# Patient Record
Sex: Male | Born: 1965 | Hispanic: No | Marital: Married | Smoking: Former smoker
Health system: Southern US, Community
[De-identification: ages and names within clinical notes are randomized; demographics above are authoritative.]

## PROBLEM LIST (undated history)

## (undated) ENCOUNTER — Ambulatory Visit (HOSPITAL_COMMUNITY): Admission: EM | Payer: 59

## (undated) DIAGNOSIS — E119 Type 2 diabetes mellitus without complications: Secondary | ICD-10-CM

## (undated) DIAGNOSIS — S90852A Superficial foreign body, left foot, initial encounter: Secondary | ICD-10-CM

## (undated) DIAGNOSIS — S2249XA Multiple fractures of ribs, unspecified side, initial encounter for closed fracture: Secondary | ICD-10-CM

## (undated) DIAGNOSIS — T7840XA Allergy, unspecified, initial encounter: Secondary | ICD-10-CM

## (undated) DIAGNOSIS — E11621 Type 2 diabetes mellitus with foot ulcer: Secondary | ICD-10-CM

## (undated) DIAGNOSIS — N184 Chronic kidney disease, stage 4 (severe): Secondary | ICD-10-CM

## (undated) DIAGNOSIS — I10 Essential (primary) hypertension: Secondary | ICD-10-CM

## (undated) DIAGNOSIS — L97509 Non-pressure chronic ulcer of other part of unspecified foot with unspecified severity: Secondary | ICD-10-CM

## (undated) DIAGNOSIS — N186 End stage renal disease: Secondary | ICD-10-CM

## (undated) DIAGNOSIS — S83209A Unspecified tear of unspecified meniscus, current injury, unspecified knee, initial encounter: Secondary | ICD-10-CM

## (undated) DIAGNOSIS — Z8631 Personal history of diabetic foot ulcer: Secondary | ICD-10-CM

## (undated) HISTORY — DX: Personal history of diabetic foot ulcer: Z86.31

## (undated) HISTORY — DX: Type 2 diabetes mellitus with foot ulcer: E11.621

## (undated) HISTORY — DX: Multiple fractures of ribs, unspecified side, initial encounter for closed fracture: S22.49XA

## (undated) HISTORY — DX: Type 2 diabetes mellitus with foot ulcer: L97.509

## (undated) HISTORY — PX: EYE SURGERY: SHX253

## (undated) HISTORY — DX: Allergy, unspecified, initial encounter: T78.40XA

## (undated) HISTORY — DX: Chronic kidney disease, stage 4 (severe): N18.4

## (undated) MED ORDER — MORPHINE SULFATE 1 MG/ML IV SOLN SYRINGE
INTRAVENOUS | Status: AC
Start: 2014-10-28 — End: ?

## (undated) MED ORDER — OXYCODONE HCL 5 MG OR TABS
5.00 mg | ORAL_TABLET | ORAL | Status: AC | PRN
Start: 2014-10-30 — End: ?

## (undated) NOTE — *Deleted (*Deleted)
    SUBJECTIVE:   CHIEF COMPLAINT / HPI:   [name] is a 25 yr old male who presents today for hosp follow up   HTN Amlodipine 10mg  daily, hydralazine 25mg  . Tolerating well without side effects  DM, hypoglycemia   CKD, stage 4 Cr on discharge 7.37, GFR 8, BUN 81, phosp 8.4, K5 . Sevalemer held  2/2 diarrhea. D/c on kayexalate, pt has OP nephrology OP on 08/16/20.   PERTINENT  PMH / PSH: HTN, CKD, DM   OBJECTIVE:   There were no vitals taken for this visit.  General: Alert and cooperative and appears to be in no acute distress HEENT: Neck non-tender without lymphadenopathy, masses or thyromegaly Cardio: Normal S1 and S2, no S3 or S4. Rhythm is regular***. No murmurs or rubs.   Pulm: Clear to auscultation bilaterally, no crackles, wheezing, or diminished breath sounds. Normal respiratory effort Abdomen: Bowel sounds normal. Abdomen soft and non-tender.  Extremities: No peripheral edema. Warm/ well perfused.  Strong radial and pedal pulses***. Neuro: Cranial nerves grossly intact  ASSESSMENT/PLAN:   No problem-specific Assessment & Plan notes found for this encounter.     Lattie Haw, MD [place]

---

## 1999-12-01 ENCOUNTER — Emergency Department (HOSPITAL_COMMUNITY): Admission: EM | Admit: 1999-12-01 | Discharge: 1999-12-01 | Payer: Self-pay | Admitting: Emergency Medicine

## 1999-12-04 ENCOUNTER — Emergency Department (HOSPITAL_COMMUNITY): Admission: EM | Admit: 1999-12-04 | Discharge: 1999-12-04 | Payer: Self-pay | Admitting: Emergency Medicine

## 1999-12-04 ENCOUNTER — Encounter: Payer: Self-pay | Admitting: *Deleted

## 2000-02-07 ENCOUNTER — Emergency Department (HOSPITAL_COMMUNITY): Admission: EM | Admit: 2000-02-07 | Discharge: 2000-02-07 | Payer: Self-pay | Admitting: Emergency Medicine

## 2002-10-07 ENCOUNTER — Emergency Department (HOSPITAL_COMMUNITY): Admission: EM | Admit: 2002-10-07 | Discharge: 2002-10-07 | Payer: Self-pay | Admitting: Emergency Medicine

## 2010-03-18 ENCOUNTER — Emergency Department (HOSPITAL_COMMUNITY): Admission: EM | Admit: 2010-03-18 | Discharge: 2010-03-19 | Payer: Self-pay | Admitting: Emergency Medicine

## 2012-04-13 ENCOUNTER — Emergency Department (HOSPITAL_COMMUNITY): Payer: Self-pay

## 2012-04-13 ENCOUNTER — Inpatient Hospital Stay (HOSPITAL_COMMUNITY)
Admission: EM | Admit: 2012-04-13 | Discharge: 2012-04-15 | DRG: 603 | Disposition: A | Discharge status: Home / Self Care | Payer: Self-pay | Attending: Internal Medicine | Admitting: Internal Medicine

## 2012-04-13 ENCOUNTER — Encounter (HOSPITAL_COMMUNITY): Payer: Self-pay | Admitting: Emergency Medicine

## 2012-04-13 DIAGNOSIS — L02619 Cutaneous abscess of unspecified foot: Principal | ICD-10-CM | POA: Diagnosis present

## 2012-04-13 DIAGNOSIS — L03119 Cellulitis of unspecified part of limb: Principal | ICD-10-CM | POA: Diagnosis present

## 2012-04-13 DIAGNOSIS — Y92009 Unspecified place in unspecified non-institutional (private) residence as the place of occurrence of the external cause: Secondary | ICD-10-CM

## 2012-04-13 DIAGNOSIS — E1165 Type 2 diabetes mellitus with hyperglycemia: Secondary | ICD-10-CM | POA: Diagnosis present

## 2012-04-13 DIAGNOSIS — L03116 Cellulitis of left lower limb: Secondary | ICD-10-CM | POA: Diagnosis present

## 2012-04-13 DIAGNOSIS — L089 Local infection of the skin and subcutaneous tissue, unspecified: Secondary | ICD-10-CM | POA: Diagnosis present

## 2012-04-13 DIAGNOSIS — I10 Essential (primary) hypertension: Secondary | ICD-10-CM | POA: Diagnosis present

## 2012-04-13 DIAGNOSIS — Z720 Tobacco use: Secondary | ICD-10-CM | POA: Diagnosis present

## 2012-04-13 DIAGNOSIS — W269XXA Contact with unspecified sharp object(s), initial encounter: Secondary | ICD-10-CM | POA: Diagnosis present

## 2012-04-13 DIAGNOSIS — S90852A Superficial foreign body, left foot, initial encounter: Secondary | ICD-10-CM

## 2012-04-13 DIAGNOSIS — IMO0002 Reserved for concepts with insufficient information to code with codable children: Secondary | ICD-10-CM | POA: Diagnosis present

## 2012-04-13 DIAGNOSIS — Y998 Other external cause status: Secondary | ICD-10-CM

## 2012-04-13 DIAGNOSIS — Z8639 Personal history of other endocrine, nutritional and metabolic disease: Secondary | ICD-10-CM | POA: Diagnosis present

## 2012-04-13 DIAGNOSIS — F17200 Nicotine dependence, unspecified, uncomplicated: Secondary | ICD-10-CM | POA: Diagnosis present

## 2012-04-13 DIAGNOSIS — E119 Type 2 diabetes mellitus without complications: Secondary | ICD-10-CM | POA: Diagnosis present

## 2012-04-13 LAB — CBC WITH DIFFERENTIAL/PLATELET
Basophils Absolute: 0 10*3/uL (ref 0.0–0.1)
Basophils Relative: 0 % (ref 0–1)
Eosinophils Absolute: 0.6 10*3/uL (ref 0.0–0.7)
Eosinophils Relative: 5 % (ref 0–5)
HCT: 37.9 % — ABNORMAL LOW (ref 39.0–52.0)
Hemoglobin: 14 g/dL (ref 13.0–17.0)
Lymphocytes Relative: 9 % — ABNORMAL LOW (ref 12–46)
Lymphs Abs: 1.2 10*3/uL (ref 0.7–4.0)
MCH: 32.2 pg (ref 26.0–34.0)
MCHC: 36.9 g/dL — ABNORMAL HIGH (ref 30.0–36.0)
MCV: 87.1 fL (ref 78.0–100.0)
Monocytes Absolute: 1 10*3/uL (ref 0.1–1.0)
Monocytes Relative: 8 % (ref 3–12)
Neutro Abs: 9.6 10*3/uL — ABNORMAL HIGH (ref 1.7–7.7)
Neutrophils Relative %: 78 % — ABNORMAL HIGH (ref 43–77)
Platelets: 132 10*3/uL — ABNORMAL LOW (ref 150–400)
RBC: 4.35 MIL/uL (ref 4.22–5.81)
RDW: 12.3 % (ref 11.5–15.5)
WBC: 12.3 10*3/uL — ABNORMAL HIGH (ref 4.0–10.5)

## 2012-04-13 LAB — BASIC METABOLIC PANEL
BUN: 14 mg/dL (ref 6–23)
CO2: 26 mEq/L (ref 19–32)
Calcium: 8.7 mg/dL (ref 8.4–10.5)
Chloride: 100 mEq/L (ref 96–112)
Creatinine, Ser: 0.95 mg/dL (ref 0.50–1.35)
GFR calc Af Amer: >90 mL/min (ref >90)
GFR calc non Af Amer: >90 mL/min (ref >90)
Glucose, Bld: 311 mg/dL — ABNORMAL HIGH (ref 70–99)
Potassium: 3.8 mEq/L (ref 3.5–5.1)
Sodium: 136 mEq/L (ref 135–145)

## 2012-04-13 MED ORDER — PIPERACILLIN-TAZOBACTAM 3.375 G IVPB
3.3750 g | INTRAVENOUS | Status: AC
  Administered 2012-04-13: 3.375 g via INTRAVENOUS
  Filled 2012-04-13: qty 50

## 2012-04-13 MED ORDER — VANCOMYCIN HCL 10 G IV SOLR: 1000.0000 mg | Freq: Once | INTRAVENOUS | Status: DC

## 2012-04-13 MED ORDER — LIDOCAINE HCL (PF) 1 % IJ SOLN
5.0000 mL | Freq: Once | INTRAMUSCULAR | Status: AC
  Administered 2012-04-13: 5 mL
  Filled 2012-04-13: qty 5

## 2012-04-13 MED ORDER — MORPHINE SULFATE 4 MG/ML IJ SOLN
4.0000 mg | Freq: Once | INTRAMUSCULAR | Status: AC
  Administered 2012-04-13: 4 mg via INTRAVENOUS
  Filled 2012-04-13: qty 1

## 2012-04-13 MED ORDER — VANCOMYCIN HCL IN DEXTROSE 1-5 GM/200ML-% IV SOLN
1000.0000 mg | Freq: Once | INTRAVENOUS | Status: AC
  Administered 2012-04-14: 1000 mg via INTRAVENOUS
  Filled 2012-04-13: qty 200

## 2012-04-13 MED ORDER — TETANUS-DIPHTH-ACELL PERTUSSIS 5-2.5-18.5 LF-MCG/0.5 IM SUSP
0.5000 mL | Freq: Once | INTRAMUSCULAR | Status: AC
  Administered 2012-04-13: 0.5 mL via INTRAMUSCULAR
  Filled 2012-04-13: qty 0.5

## 2012-04-13 NOTE — H&P (Signed)
[name] is an 46 y.o. male.   PCP - None. Chief Complaint: Left foot pain and swelling. HPI: 46 year old male known history of diabetes mellitus type 2 and is not on any medications for last 8 months due to financial reasons had a injury to his left foot when he stood on a metallic piece 3 days ago. Since then his left foot started to swell up and pain. Since it was worsening he came to the ER. The left foot was swollen and erythematous. X-rays reveal a foreign body. At this time on-call orthopedic surgeon Dr.[name] was consulted by the ER physician. Patient has been admitted for IV antibiotics and an orthopedic surgeon will be seeing patient in consult. Patient otherwise denies any chest pain or shortness of breath or any loss of consciousness. Denies any pain or swelling right leg.  Past Medical History  Diagnosis Date  . Diabetes mellitus     History reviewed. No pertinent past surgical history.  Family History  Problem Relation Age of Onset  . Diabetes type II Mother    Social History:  reports that he has been smoking.  He does not have any smokeless tobacco history on file. He reports that he drinks alcohol. He reports that he does not use illicit drugs.  Allergies: No Known Allergies   (Not in a hospital admission)  Results for orders placed during the hospital encounter of 04/13/12 (from the past 48 hour(s))  CBC WITH DIFFERENTIAL     Status: Abnormal   Collection Time   04/13/12  5:57 PM      Component Value Range Comment   WBC 12.3 (*) 4.0 - 10.5 K/uL    RBC 4.35  4.22 - 5.81 MIL/uL    Hemoglobin 14.0  13.0 - 17.0 g/dL    HCT 37.9 (*) 39.0 - 52.0 %    MCV 87.1  78.0 - 100.0 fL    MCH 32.2  26.0 - 34.0 pg    MCHC 36.9 (*) 30.0 - 36.0 g/dL    RDW 12.3  11.5 - 15.5 %    Platelets 132 (*) 150 - 400 K/uL    Neutrophils Relative 78 (*) 43 - 77 %    Neutro Abs 9.6 (*) 1.7 - 7.7 K/uL    Lymphocytes Relative 9 (*) 12 - 46 %    Lymphs Abs 1.2  0.7 - 4.0 K/uL    Monocytes  Relative 8  3 - 12 %    Monocytes Absolute 1.0  0.1 - 1.0 K/uL    Eosinophils Relative 5  0 - 5 %    Eosinophils Absolute 0.6  0.0 - 0.7 K/uL    Basophils Relative 0  0 - 1 %    Basophils Absolute 0.0  0.0 - 0.1 K/uL   BASIC METABOLIC PANEL     Status: Abnormal   Collection Time   04/13/12  5:57 PM      Component Value Range Comment   Sodium 136  135 - 145 mEq/L    Potassium 3.8  3.5 - 5.1 mEq/L    Chloride 100  96 - 112 mEq/L    CO2 26  19 - 32 mEq/L    Glucose, Bld 311 (*) 70 - 99 mg/dL    BUN 14  6 - 23 mg/dL    Creatinine, Ser 0.95  0.50 - 1.35 mg/dL    Calcium 8.7  8.4 - 10.5 mg/dL    GFR calc non Af Amer >90  >90 mL/min  GFR calc Af Amer >90  >90 mL/min   GLUCOSE, CAPILLARY     Status: Abnormal   Collection Time   04/13/12  9:11 PM      Component Value Range Comment   Glucose-Capillary 180 (*) 70 - 99 mg/dL    Dg Foot Complete Left  04/13/2012  *RADIOLOGY REPORT*  Clinical Data: Remove foreign body several days ago now with pain and swelling in the proximal left great toe  LEFT FOOT - COMPLETE 3+ VIEW  Comparison: None.  Findings: No acute fracture is seen.  No bony demineralization or erosion is noted.  Best seen on the lateral view there is a small foreign body in the soft tissues of the plantar aspect of the ball of the foot at the level of the left first MTP joint.  IMPRESSION: Small foreign body in the soft tissues of the plantar aspect of the foot.  No bony abnormality is seen.  Original Report Authenticated By: Joretta Bachelor, M.D.    Review of Systems  Constitutional: Negative.   HENT: Negative.   Eyes: Negative.   Respiratory: Negative.   Cardiovascular: Negative.   Gastrointestinal: Negative.   Genitourinary: Negative.   Musculoskeletal:       Pain and swelling of the left foot.  Skin: Negative.   Neurological: Negative.   Endo/Heme/Allergies: Negative.   Psychiatric/Behavioral: Negative.     Blood pressure 172/83, pulse 84, temperature 97.7 F (36.5 C),  temperature source Oral, resp. rate 18, SpO2 100.00%. Physical Exam  Constitutional: He is oriented to person, place, and time. He appears well-developed and well-nourished. No distress.  HENT:  Head: Normocephalic and atraumatic.  Right Ear: External ear normal.  Left Ear: External ear normal.  Nose: Nose normal.  Mouth/Throat: Oropharynx is clear and moist. No oropharyngeal exudate.  Eyes: Conjunctivae are normal. Pupils are equal, round, and reactive to light. Right eye exhibits no discharge. Left eye exhibits no discharge. No scleral icterus.  Neck: Normal range of motion. Neck supple.  Cardiovascular: Normal rate and regular rhythm.   Respiratory: Effort normal and breath sounds normal.  GI: Soft. Bowel sounds are normal.  Musculoskeletal:       Swelling of left foot with erythema. Has good pulse.  Neurological: He is alert and oriented to person, place, and time.       Moves all extremities.  Skin: He is not diaphoretic. There is erythema (Left foot.).     Assessment/Plan #1. Cellulitis of the left foot with foreign body - patient has been started on vancomycin and Zosyn which we will continue. Patient has received tetanus booster. Orthopedic surgeon is to see the patient in consult. #2. Diabetes mellitus2 - patient is refusing to be on any type of insulin. At this time we will check his CBGs a.c. and at bedtime. If it is significantly high we have to ask him again if it is okay to start insulin sliding coverage. If patient has not been to go for any procedures or not receive any IV contrast then may consider starting metformin later. #3. Tobacco abuse - have advised patient to quit smoking.  CODE STATUS  - full code.    [name]. 04/13/2012, 11:51 PM

## 2012-04-13 NOTE — ED Notes (Signed)
Pt c/o left foot pain after having piece of metal stuck in foot; foot now red, swollen and painful; pt with hx of DM

## 2012-04-13 NOTE — ED Notes (Signed)
Pt states he has been off his medication for diabetes for 1 year.

## 2012-04-13 NOTE — ED Provider Notes (Signed)
History     CSN: [id number]  Arrival date & time 04/13/12  1744   First MD Initiated Contact with Patient 04/13/12 1957      Chief Complaint  Patient presents with  . Foot Pain    (Consider location/radiation/quality/duration/timing/severity/associated sxs/prior treatment) Patient is a 46 y.o. male presenting with lower extremity pain. The history is provided by the patient.  Foot Pain  He is diabetic but lost his insurance is been off of his medications for the last 8 months. During that time, he has not checked his blood sugars. He was doing some work with siding 3 days ago. 2 days ago, he woke up and he thought there may been piece of metal in his foot he pulled a piece of metal out. Today, his foot is swollen and red and painful. Pain is moderately severe and is rated at 8/10. It is worse with palpation or with standing. He denies fever, chills, sweats. He is worried there might be a piece of metal left in his foot. The metal pierced the sole of his tennis shoes.  Past Medical History  Diagnosis Date  . Diabetes mellitus     History reviewed. No pertinent past surgical history.  History reviewed. No pertinent family history.  History  Substance Use Topics  . Smoking status: Current Some Day Smoker  . Smokeless tobacco: Not on file  . Alcohol Use: Yes      Review of Systems  All other systems reviewed and are negative.    Allergies  Review of patient's allergies indicates no known allergies.  Home Medications  No current outpatient prescriptions on file.  BP 162/82  Pulse 89  Temp 98.4 F (36.9 C) (Oral)  Resp 19  SpO2 98%  Physical Exam  Nursing note and vitals reviewed.  46 year old male who is resting comfortably and in no acute distress. Vital signs are significant for hypertension with blood pressure 161/87. Oxygen saturation is 97% which is normal. Is normocephalic and atraumatic. PERRLA, EOMI. Neck is nontender and supple. Back is nontender. Lungs  are clear without rales, wheezes, or rhonchi. Heart has regular rate and rhythm without murmur. Abdomen is soft, flat, nontender without masses or hepatosplenomegaly and peristalsis is normal active. Extremities: There is a puncture wound in the plantar surface of the left foot over the region of the first MTP joint. The distal foot is erythematous, warm, and swollen. No lymphangitic streaks are seen. Skin is warm and dry without other rash. Neurologic: Mental status is normal, cranial nerves are intact, there are no motor or sensory deficits.  ED Course  FOREIGN BODY REMOVAL Date/Time: 04/13/2012 9:30 PM Performed by: Delora Fuel Authorized by: Roxanne Mins, Eveline Sauve Consent: Verbal consent obtained. Written consent not obtained. Risks and benefits: risks, benefits and alternatives were discussed Consent given by: patient Patient understanding: patient states understanding of the procedure being performed Patient consent: the patient's understanding of the procedure matches consent given Procedure consent: procedure consent matches procedure scheduled Relevant documents: relevant documents present and verified Test results: test results available and properly labeled Site marked: the operative site was marked Imaging studies: imaging studies available Required items: required blood products, implants, devices, and special equipment available Patient identity confirmed: verbally with patient and arm band Time out: Immediately prior to procedure a "time out" was called to verify the correct patient, procedure, equipment, support staff and site/side marked as required. Intake: left foot  Anesthesia: local infiltration Local anesthetic: lidocaine 1% without epinephrine Anesthetic total: 5 ml Patient sedated:  no Patient restrained: no Patient cooperative: yes Complexity: simple 0 objects recovered. Post-procedure assessment: foreign body not removed Patient tolerance: Patient tolerated the procedure  well with no immediate complications. Comments: Unable to locate foreign body - will need to be done under fluoroscopy.   (including critical care time)  Results for orders placed during the hospital encounter of 04/13/12  CBC WITH DIFFERENTIAL      Component Value Range   WBC 12.3 (*) 4.0 - 10.5 K/uL   RBC 4.35  4.22 - 5.81 MIL/uL   Hemoglobin 14.0  13.0 - 17.0 g/dL   HCT 37.9 (*) 39.0 - 52.0 %   MCV 87.1  78.0 - 100.0 fL   MCH 32.2  26.0 - 34.0 pg   MCHC 36.9 (*) 30.0 - 36.0 g/dL   RDW 12.3  11.5 - 15.5 %   Platelets 132 (*) 150 - 400 K/uL   Neutrophils Relative 78 (*) 43 - 77 %   Neutro Abs 9.6 (*) 1.7 - 7.7 K/uL   Lymphocytes Relative 9 (*) 12 - 46 %   Lymphs Abs 1.2  0.7 - 4.0 K/uL   Monocytes Relative 8  3 - 12 %   Monocytes Absolute 1.0  0.1 - 1.0 K/uL   Eosinophils Relative 5  0 - 5 %   Eosinophils Absolute 0.6  0.0 - 0.7 K/uL   Basophils Relative 0  0 - 1 %   Basophils Absolute 0.0  0.0 - 0.1 K/uL  BASIC METABOLIC PANEL      Component Value Range   Sodium 136  135 - 145 mEq/L   Potassium 3.8  3.5 - 5.1 mEq/L   Chloride 100  96 - 112 mEq/L   CO2 26  19 - 32 mEq/L   Glucose, Bld 311 (*) 70 - 99 mg/dL   BUN 14  6 - 23 mg/dL   Creatinine, Ser 0.95  0.50 - 1.35 mg/dL   Calcium 8.7  8.4 - 10.5 mg/dL   GFR calc non Af Amer >90  >90 mL/min   GFR calc Af Amer >90  >90 mL/min  GLUCOSE, CAPILLARY      Component Value Range   Glucose-Capillary 180 (*) 70 - 99 mg/dL   Dg Foot Complete Left  04/13/2012  *RADIOLOGY REPORT*  Clinical Data: Remove foreign body several days ago now with pain and swelling in the proximal left great toe  LEFT FOOT - COMPLETE 3+ VIEW  Comparison: None.  Findings: No acute fracture is seen.  No bony demineralization or erosion is noted.  Best seen on the lateral view there is a small foreign body in the soft tissues of the plantar aspect of the ball of the foot at the level of the left first MTP joint.  IMPRESSION: Small foreign body in the soft tissues  of the plantar aspect of the foot.  No bony abnormality is seen.  Original Report Authenticated By: Joretta Bachelor, M.D.      1. Cellulitis of left foot   2. Foreign body in left foot   3. Diabetes mellitus       MDM  Cellulitis with retained foreign body. Foreign body we'll need to be removed. Because cellulitis is from something which punctured through the sole of his sneaker, he will need coverage for her Pseudomonas. He is started on vancomycin and Zosyn.  Attempts to remove foreign body were unsuccessful. Display to be done under fluoroscopy. I have contacted Dr. Sharol Given of orthopedic surgery who will see  the patient in the hospital. He will be admitted for IV antibiotics and orthopedic consultation. Case is discussed with Dr. Hal Hope of [facility] who agrees to admit the patient.        Delora Fuel, MD [id number]

## 2012-04-14 ENCOUNTER — Encounter (HOSPITAL_COMMUNITY): Payer: Self-pay | Admitting: Orthopedic Surgery

## 2012-04-14 DIAGNOSIS — I10 Essential (primary) hypertension: Secondary | ICD-10-CM | POA: Diagnosis present

## 2012-04-14 LAB — GLUCOSE, CAPILLARY: Glucose-Capillary: 194 mg/dL — ABNORMAL HIGH (ref 70–99)

## 2012-04-14 MED ORDER — MUPIROCIN CALCIUM 2 % EX CREA
TOPICAL_CREAM | Freq: Two times a day (BID) | CUTANEOUS | Status: DC
  Administered 2012-04-14 (×2): via TOPICAL
  Filled 2012-04-14: qty 15

## 2012-04-14 MED ORDER — ACETAMINOPHEN 325 MG PO TABS
650.0000 mg | ORAL_TABLET | Freq: Four times a day (QID) | ORAL | Status: DC | PRN
  Administered 2012-04-14: 650 mg via ORAL
  Filled 2012-04-14: qty 2

## 2012-04-14 MED ORDER — ONDANSETRON HCL 4 MG/2ML IJ SOLN: 4.0000 mg | Freq: Three times a day (TID) | INTRAMUSCULAR | Status: DC | PRN

## 2012-04-14 MED ORDER — INSULIN ASPART 100 UNIT/ML ~~LOC~~ SOLN
0.0000 [IU] | Freq: Three times a day (TID) | SUBCUTANEOUS | Status: DC
  Administered 2012-04-14 (×2): 2 [IU] via SUBCUTANEOUS
  Administered 2012-04-15 (×3): 3 [IU] via SUBCUTANEOUS

## 2012-04-14 MED ORDER — ONDANSETRON HCL 4 MG/2ML IJ SOLN: 4.0000 mg | Freq: Four times a day (QID) | INTRAMUSCULAR | Status: DC | PRN

## 2012-04-14 MED ORDER — AMLODIPINE BESYLATE 5 MG PO TABS
5.0000 mg | ORAL_TABLET | Freq: Every day | ORAL | Status: DC
  Administered 2012-04-15: 5 mg via ORAL
  Filled 2012-04-14: qty 1

## 2012-04-14 MED ORDER — VANCOMYCIN HCL IN DEXTROSE 1-5 GM/200ML-% IV SOLN
1000.0000 mg | Freq: Three times a day (TID) | INTRAVENOUS | Status: DC
  Administered 2012-04-14 – 2012-04-15 (×4): 1000 mg via INTRAVENOUS
  Filled 2012-04-14 (×6): qty 200

## 2012-04-14 MED ORDER — HYDROMORPHONE HCL PF 1 MG/ML IJ SOLN
0.5000 mg | INTRAMUSCULAR | Status: DC | PRN
  Administered 2012-04-14 (×2): 0.5 mg via INTRAVENOUS
  Filled 2012-04-14: qty 1

## 2012-04-14 MED ORDER — INSULIN GLARGINE 100 UNIT/ML ~~LOC~~ SOLN
6.0000 [IU] | Freq: Every day | SUBCUTANEOUS | Status: DC
  Administered 2012-04-15: 6 [IU] via SUBCUTANEOUS

## 2012-04-14 MED ORDER — PIPERACILLIN-TAZOBACTAM 3.375 G IVPB
3.3750 g | Freq: Three times a day (TID) | INTRAVENOUS | Status: DC
Start: 2012-04-14 — End: 2012-04-15
  Administered 2012-04-14 – 2012-04-15 (×5): 3.375 g via INTRAVENOUS
  Filled 2012-04-14 (×6): qty 50

## 2012-04-14 MED ORDER — SODIUM CHLORIDE 0.9 % IV SOLN
INTRAVENOUS | Status: DC
  Administered 2012-04-14: 01:00:00 via INTRAVENOUS

## 2012-04-14 MED ORDER — HYDROMORPHONE HCL PF 1 MG/ML IJ SOLN: 1.0000 mg | INTRAMUSCULAR | Status: DC | PRN

## 2012-04-14 MED ORDER — ACETAMINOPHEN 650 MG RE SUPP: 650.0000 mg | Freq: Four times a day (QID) | RECTAL | Status: DC | PRN

## 2012-04-14 MED ORDER — HYDROMORPHONE HCL PF 1 MG/ML IJ SOLN
INTRAMUSCULAR | Status: AC
  Administered 2012-04-14: 0.5 mg
  Filled 2012-04-14: qty 1

## 2012-04-14 MED ORDER — ONDANSETRON HCL 4 MG PO TABS: 4.0000 mg | ORAL_TABLET | Freq: Four times a day (QID) | ORAL | Status: DC | PRN

## 2012-04-14 NOTE — Progress Notes (Signed)
Nurse had conversation with pt & spouse re; DM and seriousness of being non-compliant with insulin.  Pt stated he would reconsider insulin or other medications to treat his DM.  Nursing to continue to monitor for status changes.

## 2012-04-14 NOTE — Progress Notes (Signed)
[name] is a 46 y.o. male admitted with cellulitis resulting from a pernetrating metalic object in the left foot. [name] says he has good feeling in both his feet(diabetic off meds for 9 months now), but says he did not experience any pain when he stepped on the metal. Apparently, he figured it out later on. He is not too enthused about taking meds, especially insulin, which he has now agreed to take. He is worried about the cost of meds, but also seems to not trust the benefit of the medications. Apprecaite orthopedics assistance.  SUBJECTIVE Feels better since admission.   1. Cellulitis of left foot   2. Foreign body in left foot   3. Diabetes mellitus   4. Tobacco abuse     Past Medical History  Diagnosis Date  . Diabetes mellitus    Current Facility-Administered Medications  Medication Dose Route Frequency Provider Last Rate Last Dose  . 0.9 %  sodium chloride infusion   Intravenous Continuous Rise Patience, MD 75 mL/hr at 04/14/12 1757    . acetaminophen (TYLENOL) tablet 650 mg  650 mg Oral Q6H PRN Rise Patience, MD   650 mg at 04/14/12 0841   Or  . acetaminophen (TYLENOL) suppository 650 mg  650 mg Rectal Q6H PRN Rise Patience, MD      . amLODipine (NORVASC) tablet 5 mg  5 mg Oral Daily Alie Moudy, MD      . HYDROmorphone (DILAUDID) 1 MG/ML injection        0.5 mg at 04/14/12 0100  . HYDROmorphone (DILAUDID) injection 0.5 mg  0.5 mg Intravenous Q4H PRN Rise Patience, MD   0.5 mg at 04/14/12 1835  . insulin aspart (novoLOG) injection 0-9 Units  0-9 Units Subcutaneous TID WC Nat Math, MD   2 Units at 04/14/12 1632  . insulin glargine (LANTUS) injection 6 Units  6 Units Subcutaneous Daily Khi Mcmillen, MD      . mupirocin cream (BACTROBAN) 2 %   Topical BID Newt Minion, MD      . ondansetron [facility]) tablet 4 mg  4 mg Oral Q6H PRN Rise Patience, MD       Or  . ondansetron [facility]) injection 4 mg  4 mg Intravenous Q6H PRN Rise Patience, MD      . piperacillin-tazobactam (ZOSYN) IVPB 3.375 g  3.375 g Intravenous STAT Delora Fuel, MD   [id number] g at 04/13/12 2037  . piperacillin-tazobactam (ZOSYN) IVPB 3.375 g  3.375 g Intravenous Q8H Rogue Bussing, PHARMD   3.375 g at 04/14/12 2118  . vancomycin (VANCOCIN) IVPB 1000 mg/200 mL premix  1,000 mg Intravenous Once Delora Fuel, MD   [id number] mg at 04/14/12 0227  . vancomycin (VANCOCIN) IVPB 1000 mg/200 mL premix  1,000 mg Intravenous Q8H Rogue Bussing, PHARMD   1,000 mg at 04/14/12 1757  . DISCONTD: HYDROmorphone (DILAUDID) injection 1 mg  1 mg Intravenous [id number] PRN Delora Fuel, MD      . DISCONTD: ondansetron [facility]) injection 4 mg  4 mg Intravenous [id number] PRN Delora Fuel, MD      . DISCONTD: vancomycin Zollie Pee) injection 1,000 mg  1,000 mg Intravenous Once Delora Fuel, MD       No Known Allergies Principal Problem:  *Cellulitis of foot, left Active Problems:  DM2 (diabetes mellitus, type 2)  Tobacco abuse   Vital signs in last 24 hours: Temp:  [97.5 F (36.4 C)-98.8 F (37.1 C)] 97.5 F (36.4 C) (  07/09 2154) Pulse Rate:  [70-87] 87  (07/09 2154) Resp:  [18-20] 18  (07/09 2154) BP: (144-172)/(77-94) 166/94 mmHg (07/09 2154) SpO2:  [99 %-100 %] 100 % (07/09 2154) Weight:  [95.165 kg (209 lb 12.8 oz)] 95.165 kg (209 lb 12.8 oz) (07/09 0007) Weight change:  Last BM Date: 04/13/12  Intake/Output from previous day:   Intake/Output this shift:    Lab Results:  Basename 04/13/12 1757  WBC 12.3*  HGB 14.0  HCT 37.9*  PLT 132*   BMET  Basename 04/13/12 1757  NA 136  K 3.8  CL 100  CO2 26  GLUCOSE 311*  BUN 14  CREATININE 0.95  CALCIUM 8.7    Studies/Results: Dg Foot Complete Left  04/13/2012  *RADIOLOGY REPORT*  Clinical Data: Remove foreign body several days ago now with pain and swelling in the proximal left great toe  LEFT FOOT - COMPLETE 3+ VIEW  Comparison: None.  Findings: No acute fracture is seen.  No bony demineralization or  erosion is noted.  Best seen on the lateral view there is a small foreign body in the soft tissues of the plantar aspect of the ball of the foot at the level of the left first MTP joint.  IMPRESSION: Small foreign body in the soft tissues of the plantar aspect of the foot.  No bony abnormality is seen.  Original Report Authenticated By: Joretta Bachelor, M.D.    Medications: I have reviewed the patient's current medications.   Physical exam GENERAL- alert HEAD- normal atraumatic, no neck masses, normal thyroid, no jvd RESPIRATORY- appears well, vitals normal, no respiratory distress, acyanotic, normal RR, ear and throat exam is normal, neck free of mass or lymphadenopathy, chest clear, no wheezing, crepitations, rhonchi, normal symmetric air entry CVS- regular rate and rhythm, S1, S2 normal, no murmur, click, rub or gallop ABDOMEN- abdomen is soft without significant tenderness, masses, organomegaly or guarding NEURO- Grossly normal EXTREMITIES- swollen, warm, erythematous left foot.  Plan  * Cellulitis of foot, left/lodged foreign body- appreciate ortho. Continue current abx. Wonder if he has peripheral neuropathy. * DM2 (diabetes mellitus, type 2)- lantus/aspart, maybe d/c on metformin or another oral. Check hba1c. * Htn- start norvasc. * Tobacco abuse    [name] 04/14/2012 10:34 PM Pager: [phone].

## 2012-04-14 NOTE — Progress Notes (Signed)
UR COMPLETED  

## 2012-04-14 NOTE — Care Management Note (Signed)
    Page 1 of 1   04/14/2012     3:51:47 PM   CARE MANAGEMENT NOTE 04/14/2012  Patient:  [name], [name]   Account Number:  [id number]  Date Initiated:  04/14/2012  Documentation initiated by:  Rozanna Boer  Subjective/Objective Assessment:   foreign body in foot  h/o DM, Glu 311 on admission, not taking diab. med due to financial .     Action/Plan:   Medication assistance  Medical f/u   Anticipated DC Date:  04/17/2012   Anticipated DC Plan:  [place] / [facility] (established/new)      Choice offered to / List presented to:             Status of service:  Completed, signed off Medicare Important Message given?  NO (If response is "NO", the following Medicare IM given date fields will be blank) Date Medicare IM given:   Date Additional Medicare IM given:    Discharge Disposition:    Per UR Regulation:  Reviewed for med. necessity/level of care/duration of stay  If discussed at [place] of Stay Meetings, dates discussed:    Comments:  7/913  15:15 Rozanna Boer RN/CM No PCP, self pay provided patient with [place] card free medication for DM provided with [facility] information and received permission to fax referral to [facility] faxed referral to [phone]

## 2012-04-14 NOTE — Progress Notes (Signed)
ANTIBIOTIC CONSULT NOTE - INITIAL  Pharmacy Consult for Vancocin/Zosyn Indication: cellulitis  No Known Allergies  Patient Measurements: Height: 5\' 11"  (180.3 cm) Weight: 209 lb 12.8 oz (95.165 kg) IBW/kg (Calculated) : 75.3   Vital Signs: Temp: 98.2 F (36.8 C) (07/09 0007) Temp src: Oral (07/08 2328) BP: 159/79 mmHg (07/09 0007) Pulse Rate: 78  (07/09 0007)  Labs:  Basename 04/13/12 1757  WBC 12.3*  HGB 14.0  PLT 132*  LABCREA --  CREATININE 0.95   Estimated Creatinine Clearance: 118.1 ml/min (by C-G formula based on Cr of 0.95).  Microbiology: No results found for this or any previous visit (from the past 720 hour(s)).  Medical History: Past Medical History  Diagnosis Date  . Diabetes mellitus     Medications:  No prescriptions prior to admission   Scheduled:    . HYDROmorphone      . lidocaine  5 mL Infiltration Once  .  morphine injection  4 mg Intravenous Once  . piperacillin-tazobactam (ZOSYN)  IV  3.375 g Intravenous STAT  . piperacillin-tazobactam (ZOSYN)  IV  3.375 g Intravenous Q8H  . TDaP  0.5 mL Intramuscular Once  . vancomycin  1,000 mg Intravenous Once  . vancomycin  1,000 mg Intravenous Q8H  . DISCONTD: vancomycin  1,000 mg Intravenous Once    Assessment: 46yo male sustained injury to left foot 3d ago, worsening erythema and pain, Xray shows foreign body, to begin IV ABX for cellulitis, awaiting surgeon consult.  Goal of Therapy:  Vancomycin trough level 10-15 mcg/ml  Plan:  Will start vancomycin 1000mg  IV Q8H and Zosyn 3.375g IV Q8H and monitor CBC, Cx, levels prn.  Rogue Bussing PharmD BCPS 04/14/2012,12:43 AM

## 2012-04-14 NOTE — Consult Note (Signed)
Reason for Consult: Cellulitis left foot with foreign body Referring Physician: [name] is an 46 y.o. male.  HPI: Patient is a 46 year old gentleman with uncontrolled diabetes who has a history of foreign body beneath the great toe metatarsal head. He has developed cellulitis and is admitted this time for treatment of cellulitis and evaluation for possible removal of foreign body.  Past Medical History  Diagnosis Date  . Diabetes mellitus     History reviewed. No pertinent past surgical history.  Family History  Problem Relation Age of Onset  . Diabetes type II Mother     Social History:  reports that he has been smoking.  He does not have any smokeless tobacco history on file. He reports that he drinks alcohol. He reports that he does not use illicit drugs.  Allergies: No Known Allergies  Medications: I have reviewed the patient's current medications.  Results for orders placed during the hospital encounter of 04/13/12 (from the past 48 hour(s))  CBC WITH DIFFERENTIAL     Status: Abnormal   Collection Time   04/13/12  5:57 PM      Component Value Range Comment   WBC 12.3 (*) 4.0 - 10.5 K/uL    RBC 4.35  4.22 - 5.81 MIL/uL    Hemoglobin 14.0  13.0 - 17.0 g/dL    HCT 37.9 (*) 39.0 - 52.0 %    MCV 87.1  78.0 - 100.0 fL    MCH 32.2  26.0 - 34.0 pg    MCHC 36.9 (*) 30.0 - 36.0 g/dL    RDW 12.3  11.5 - 15.5 %    Platelets 132 (*) 150 - 400 K/uL    Neutrophils Relative 78 (*) 43 - 77 %    Neutro Abs 9.6 (*) 1.7 - 7.7 K/uL    Lymphocytes Relative 9 (*) 12 - 46 %    Lymphs Abs 1.2  0.7 - 4.0 K/uL    Monocytes Relative 8  3 - 12 %    Monocytes Absolute 1.0  0.1 - 1.0 K/uL    Eosinophils Relative 5  0 - 5 %    Eosinophils Absolute 0.6  0.0 - 0.7 K/uL    Basophils Relative 0  0 - 1 %    Basophils Absolute 0.0  0.0 - 0.1 K/uL   BASIC METABOLIC PANEL     Status: Abnormal   Collection Time   04/13/12  5:57 PM      Component Value Range Comment   Sodium 136  135 - 145  mEq/L    Potassium 3.8  3.5 - 5.1 mEq/L    Chloride 100  96 - 112 mEq/L    CO2 26  19 - 32 mEq/L    Glucose, Bld 311 (*) 70 - 99 mg/dL    BUN 14  6 - 23 mg/dL    Creatinine, Ser 0.95  0.50 - 1.35 mg/dL    Calcium 8.7  8.4 - 10.5 mg/dL    GFR calc non Af Amer >90  >90 mL/min    GFR calc Af Amer >90  >90 mL/min   GLUCOSE, CAPILLARY     Status: Abnormal   Collection Time   04/13/12  9:11 PM      Component Value Range Comment   Glucose-Capillary 180 (*) 70 - 99 mg/dL     Dg Foot Complete Left  04/13/2012  *RADIOLOGY REPORT*  Clinical Data: Remove foreign body several days ago now with pain and swelling in the  proximal left great toe  LEFT FOOT - COMPLETE 3+ VIEW  Comparison: None.  Findings: No acute fracture is seen.  No bony demineralization or erosion is noted.  Best seen on the lateral view there is a small foreign body in the soft tissues of the plantar aspect of the ball of the foot at the level of the left first MTP joint.  IMPRESSION: Small foreign body in the soft tissues of the plantar aspect of the foot.  No bony abnormality is seen.  Original Report Authenticated By: Joretta Bachelor, M.D.    Review of Systems  All other systems reviewed and are negative.   Blood pressure 160/81, pulse 70, temperature 98 F (36.7 C), temperature source Oral, resp. rate 19, height 5\' 11"  (1.803 m), weight 95.165 kg (209 lb 12.8 oz), SpO2 100.00%. Physical Exam On examination patient has a good dorsalis pedis pulse he does not have venous stasis swelling he has a very small puncture wound on the plantar aspect of the left foot great toe metatarsal head. He does have some petechial cellulitis around the great toe MTP joint. Review of the radiographs shows a very small metallic foreign body deep in the soft tissue. Assessment/Plan: Assessment: Cellulitis left foot great toe MTP joint with very small retained metallic foreign body deep in the soft tissue.  Plan: I will follow patient expectantly while on  his antibiotics. If all the cellulitis resolves then would hold on removal of deep retained hardware if patient has persistent cellulitis would consider surgical intervention later this week for removal of deep retained foreign body.  [name],[name] 04/14/2012, 6:25 AM

## 2012-04-14 NOTE — Progress Notes (Signed)
Inpatient Diabetes Program Recommendations  AACE/ADA: New Consensus Statement on Inpatient Glycemic Control (2009)  Target Ranges:  Prepandial:   less than 140 mg/dL      Peak postprandial:   less than 180 mg/dL (1-2 hours)      Critically ill patients:  140 - 180 mg/dL    Inpatient Diabetes Program Recommendations HgbA1C: to assess prehospital glucose control  Will follow during this admission.  Thank you  Raoul Pitch [facility] Inpatient Diabetes Coordinator [phone]

## 2012-04-15 ENCOUNTER — Encounter (HOSPITAL_COMMUNITY): Payer: Self-pay | Admitting: General Practice

## 2012-04-15 DIAGNOSIS — I10 Essential (primary) hypertension: Secondary | ICD-10-CM

## 2012-04-15 DIAGNOSIS — IMO0002 Reserved for concepts with insufficient information to code with codable children: Secondary | ICD-10-CM

## 2012-04-15 LAB — HEMOGLOBIN A1C: Hgb A1c MFr Bld: 10 % — ABNORMAL HIGH (ref <5.7)

## 2012-04-15 LAB — CBC
HCT: 39 % (ref 39.0–52.0)
Hemoglobin: 14.7 g/dL (ref 13.0–17.0)
RBC: 4.52 MIL/uL (ref 4.22–5.81)
RDW: 12.2 % (ref 11.5–15.5)
WBC: 10.1 10*3/uL (ref 4.0–10.5)

## 2012-04-15 LAB — GLUCOSE, CAPILLARY
Glucose-Capillary: 207 mg/dL — ABNORMAL HIGH (ref 70–99)
Glucose-Capillary: 243 mg/dL — ABNORMAL HIGH (ref 70–99)

## 2012-04-15 LAB — COMPREHENSIVE METABOLIC PANEL
AST: 24 U/L (ref 0–37)
Albumin: 3 g/dL — ABNORMAL LOW (ref 3.5–5.2)
Alkaline Phosphatase: 108 U/L (ref 39–117)
BUN: 11 mg/dL (ref 6–23)
Chloride: 99 mEq/L (ref 96–112)
Potassium: 3.4 mEq/L — ABNORMAL LOW (ref 3.5–5.1)
Total Bilirubin: 1.1 mg/dL (ref 0.3–1.2)

## 2012-04-15 MED ORDER — METFORMIN HCL 500 MG PO TABS
500.0000 mg | ORAL_TABLET | Freq: Two times a day (BID) | ORAL | Status: DC
  Administered 2012-04-15: 500 mg via ORAL
  Filled 2012-04-15 (×2): qty 1

## 2012-04-15 MED ORDER — INSULIN GLARGINE 100 UNIT/ML ~~LOC~~ SOLN: 10.0000 [IU] | Freq: Every day | SUBCUTANEOUS | Status: DC

## 2012-04-15 MED ORDER — CLINDAMYCIN HCL 300 MG PO CAPS: 300.0000 mg | ORAL_CAPSULE | Freq: Three times a day (TID) | ORAL | Status: DC

## 2012-04-15 MED ORDER — LISINOPRIL 5 MG PO TABS
5.0000 mg | ORAL_TABLET | Freq: Every day | ORAL | Status: DC
  Administered 2012-04-15: 5 mg via ORAL
  Filled 2012-04-15: qty 1

## 2012-04-15 MED ORDER — LISINOPRIL 5 MG PO TABS: 5.0000 mg | ORAL_TABLET | Freq: Every day | ORAL | Status: DC

## 2012-04-15 MED ORDER — INSULIN GLARGINE 100 UNIT/ML ~~LOC~~ SOLN: 6.0000 [IU] | Freq: Every day | SUBCUTANEOUS | Status: DC

## 2012-04-15 MED ORDER — METFORMIN HCL 500 MG PO TABS: 500.0000 mg | ORAL_TABLET | Freq: Two times a day (BID) | ORAL | Status: DC

## 2012-04-15 MED ORDER — POTASSIUM CHLORIDE CRYS ER 20 MEQ PO TBCR
40.0000 meq | EXTENDED_RELEASE_TABLET | Freq: Two times a day (BID) | ORAL | Status: DC
  Administered 2012-04-15: 40 meq via ORAL
  Filled 2012-04-15: qty 2

## 2012-04-15 MED ORDER — CLINDAMYCIN HCL 300 MG PO CAPS
300.0000 mg | ORAL_CAPSULE | Freq: Three times a day (TID) | ORAL | Status: DC
  Administered 2012-04-15: 300 mg via ORAL
  Filled 2012-04-15 (×3): qty 1

## 2012-04-15 MED ORDER — AMLODIPINE BESYLATE 5 MG PO TABS: 5.0000 mg | ORAL_TABLET | Freq: Every day | ORAL | Status: DC

## 2012-04-15 MED ORDER — LEVOFLOXACIN 500 MG PO TABS
500.0000 mg | ORAL_TABLET | Freq: Every day | ORAL | Status: DC
  Administered 2012-04-15: 500 mg via ORAL
  Filled 2012-04-15 (×2): qty 1

## 2012-04-15 MED ORDER — LEVOFLOXACIN 500 MG PO TABS: 500.0000 mg | ORAL_TABLET | Freq: Every day | ORAL | Status: DC

## 2012-04-15 NOTE — Discharge Summary (Signed)
Physician Discharge Summary  [name] [id number] DOB: 1966-03-21 DOA: 04/13/2012  PCP: William Hamburger, MD  Admit date: 04/13/2012 Discharge date: 04/15/2012  Recommendations for Outpatient Follow-up:  1. See Dr Sharol Given in 2 weeks if the cellulitis doesn't improve. 2. Follow up PCP ( call and make appt) in 2 to 4 weeks and follow up with blood work.  3. Check cbg's atleast once a day and be compliant to medications.  4. Carb modified diet.   Discharge Diagnoses:  Principal Problem:  *Cellulitis of foot, left Active Problems:  DM2 (diabetes mellitus, type 2)  Tobacco abuse  HTN (hypertension), benign   Discharge Condition: stable  Diet recommendation: carb modified diet.  History of present illness:  [name] is a 46 y.o. male admitted with cellulitis resulting from a pernetrating metalic object in the left foot. [name] says he has good feeling in both his feet(diabetic off meds for 9 months now), but says he did not experience any pain when he stepped on the metal. Apparently, he figured it out later on. He is not too enthused about taking meds, especially insulin, which he has now agreed to take. He is worried about the cost of meds, but also seems to not trust the benefit of the medications.  Hospital Course:  1. Cellulitis of the foot with x ray of the left foot showing a soft tissue foreign body. He was started on iv vancomycin and IV zosyn on admission, his symptoms improved. Orthopedic consult was obtained to see if he need surgical intervention to remove the foreign body. As per Dr Sharol Given, the cellulitis can resolve without surgery,  And that he is good to be discharged home with oral antibiotics. Patient wants to be discharged today, I have discussed with him that some of the blood tests are still pending and his BP remains high and that we have started new medications, to control BP and BLOOD SUGARS.  He wanted to be discharged today and promised he would follow up with Dr  [name] and PCP in 4 weeks. He is being discharged on clindamycin and Levaquin and Case management is assisting him to get the medications through the pharmacy, as he does not have any insurance.   2. Diabetes mellitus: CBG (last 3)   Basename 04/15/12 1034 04/15/12 0645 04/14/12 2131  GLUCAP 207* 225* 268*    On lantus 6 units. hgba1c is still pending Started the patient on metformin .   Hypertension; not well controlled, has been off medications for one year Started the patient on amlodipine and lisinopril.   Tobacco abuse: education and counseling.   Recommendations are to see Dr [name] in 2 weeks if the cellulitis doesn't improve.   Procedures:  X ray of the left foot.   Consultations:  Orthopedic consult  Discharge Exam: Filed Vitals:   04/15/12 1100  BP: 172/86  Pulse: 86  Temp:   Resp:    Filed Vitals:   04/14/12 1300 04/14/12 2154 04/15/12 0616 04/15/12 1100  BP: 144/77 166/94 192/90 172/86  Pulse: 79 87 89 86  Temp: 98.8 F (37.1 C) 97.5 F (36.4 C) 98.4 F (36.9 C)   TempSrc:      Resp: 20 18 18    Height:      Weight:      SpO2: 99% 100% 99%    General: alert afebrile, comfortable. Cardiovascular: s1s2  heard Respiratory: CTAB Abdomen soft NT ND BS+ Extremities; foot tenderness and swelling improved. Pt is able to bear weight on the  foot  Discharge Instructions  Discharge Orders    Future Orders Please Complete By Expires   Diet - low sodium heart healthy      Discharge instructions      Comments:   Follow up with Dr Sharol Given in 2 weeks and PCP In 2 to 4 weeks.   Activity as tolerated - No restrictions        Medication List  As of 04/15/2012 12:19 PM   TAKE these medications         amLODipine 5 MG tablet   Commonly known as: NORVASC   Take 1 tablet (5 mg total) by mouth daily.      clindamycin 300 MG capsule   Commonly known as: CLEOCIN   Take 1 capsule (300 mg total) by mouth every 8 (eight) hours.      insulin glargine 100 UNIT/ML  injection   Commonly known as: LANTUS   Inject 6 Units into the skin daily.      levofloxacin 500 MG tablet   Commonly known as: LEVAQUIN   Take 1 tablet (500 mg total) by mouth daily.      lisinopril 5 MG tablet   Commonly known as: PRINIVIL,ZESTRIL   Take 1 tablet (5 mg total) by mouth daily.      metFORMIN 500 MG tablet   Commonly known as: GLUCOPHAGE   Take 1 tablet (500 mg total) by mouth 2 (two) times daily with a meal.           Follow-up Information    Follow up with [name],MARCUS V, MD in 2 weeks.   Contact information:   [place] [phone]       Follow up with William Hamburger, MD today.   Contact information:   [place] [phone]           The results of significant diagnostics from this hospitalization (including imaging, microbiology, ancillary and laboratory) are listed below for reference.    Significant Diagnostic Studies: Dg Foot Complete Left  04/13/2012  *RADIOLOGY REPORT*  Clinical Data: Remove foreign body several days ago now with pain and swelling in the proximal left great toe  LEFT FOOT - COMPLETE 3+ VIEW  Comparison: None.  Findings: No acute fracture is seen.  No bony demineralization or erosion is noted.  Best seen on the lateral view there is a small foreign body in the soft tissues of the plantar aspect of the ball of the foot at the level of the left first MTP joint.  IMPRESSION: Small foreign body in the soft tissues of the plantar aspect of the foot.  No bony abnormality is seen.  Original Report Authenticated By: Joretta Bachelor, M.D.    Microbiology: Recent Results (from the past 240 hour(s))  MRSA PCR SCREENING     Status: Normal   Collection Time   04/14/12  8:28 AM      Component Value Range Status Comment   MRSA by PCR NEGATIVE  NEGATIVE Final      Labs: Basic Metabolic Panel:  Lab [id number] 0624 04/13/12 1757  NA 136 136  K 3.4* 3.8    CL 99 100  CO2 27 26  GLUCOSE 203* 311*  BUN 11 14  CREATININE 0.67 0.95  CALCIUM 8.9 8.7  MG -- --  PHOS -- --   Liver Function Tests:  Lab 04/15/12 0624  AST 24  ALT 41  ALKPHOS 108  BILITOT 1.1  PROT 6.7  ALBUMIN 3.0*   No results found for this basename: LIPASE:5,AMYLASE:5 in the last 168 hours No results found for this basename: AMMONIA:5 in the last 168 hours CBC:  Lab 04/15/12 0624 04/13/12 1757  WBC 10.1 12.3*  NEUTROABS -- 9.6*  HGB 14.7 14.0  HCT 39.0 37.9*  MCV 86.3 87.1  PLT 140* 132*   Cardiac Enzymes: No results found for this basename: CKTOTAL:5,CKMB:5,CKMBINDEX:5,TROPONINI:5 in the last 168 hours BNP: BNP (last 3 results) No results found for this basename: PROBNP:3 in the last 8760 hours CBG:  Lab 04/15/12 1034 04/15/12 0645 04/14/12 2131 04/14/12 1621 04/14/12 1108  GLUCAP 207* 225* 268* 199* 194*    Time coordinating discharge: 60 minutes   Signed:  Chizaram Latino  [facility] 04/15/2012, 12:19 PM

## 2012-04-15 NOTE — Progress Notes (Addendum)
Pt was able to return demonstrate proper administration and is able to properly draw up insulin. Pt given handouts regarding adverse effects of diabetes, Type II and Type II diabetes information sheets, diabetic foot care, diabetic diet, s/sx hypo and hyperglycemia and emergency measures to take, etc. Pt states, "I will read them".  All questions answered.

## 2012-04-15 NOTE — Progress Notes (Signed)
Patient ID: [name], male   DOB: May 17, 1966, 46 y.o.   MRN: [id number] Cellulitis improving left foot patient is asymptomatic. Okay to discharge patient to home at this time on by mouth antibiotics. I will followup in the office in 2 weeks. Discussed with the foot gets worse he will call and I will followup immediately. Anticipate this can resolve without surgery. However if the cellulitis worsens then we can proceed with outpatient surgery for removal of the foreign body if necessary.

## 2012-04-15 NOTE — Progress Notes (Addendum)
Pt admitted for cellulitis of L big toe and L lateral foot adjacent L big toe. Pt noted to have a black dot underneath foot below L big toe site that pt states, "that is where I got metal in my foot and I tried to get it out." Pt repts decreased reddness, edema and pain of L foot. Pt foot remains swollen with 1+ slightly pitting edema and reddness and warm to touch. Pt wound areas are open to air and free of drainage. Pt rates his pain 5-6 but pt states, "That is okay for me. I don't want anything for pain".  Pt repts that his pain is most noted when he holds his foot downward. Pt is keeping LLE elevated at all times to prevent/minimize pain and swelling. Pt is up in room without difficulty. Pt has + pedal pulses bilaterally. Pt is alert and oriented x 3. Pt lungs CTA and heart rate regular rate and rhythm. No s/sx cardiac or resp distress and no c/o such. BP has been elevated. Pt is on medications for such. Pt repts BM this AM. Abdomen is soft flat nontender and nondistended. BS +x4. Pt denies nausea or vomiting. Pt repts passing gas and tolerates diet fair to good. Pt denies problems with urination. Pt instructed how to administer insulin and how to draw up insulin. Pt verb understanding and agrees to comply. Pt is able to describe s/sx of elevated and low blood sugars and emergency measures to take. Pt instructed adverse effects of uncontrolled diabetes including heart disease, blindness, peripheral vascular disease that can result in impaired healing that can result in amputations, impotence and other possible adverse effects. Pt verb understanding and agrees to comply.

## 2012-04-21 ENCOUNTER — Other Ambulatory Visit (HOSPITAL_COMMUNITY): Payer: Self-pay | Admitting: Orthopedic Surgery

## 2012-04-21 ENCOUNTER — Encounter (HOSPITAL_COMMUNITY): Payer: Self-pay | Admitting: Pharmacy Technician

## 2012-04-22 ENCOUNTER — Encounter (HOSPITAL_COMMUNITY)
Admission: RE | Admit: 2012-04-22 | Discharge: 2012-04-22 | Disposition: A | Discharge status: Home / Self Care | Payer: Medicaid Other | Source: Ambulatory Visit | Attending: Orthopedic Surgery | Admitting: Orthopedic Surgery

## 2012-04-22 ENCOUNTER — Encounter (HOSPITAL_COMMUNITY): Payer: Self-pay

## 2012-04-22 HISTORY — DX: Superficial foreign body, left foot, initial encounter: S90.852A

## 2012-04-22 HISTORY — DX: Essential (primary) hypertension: I10

## 2012-04-22 LAB — PROTIME-INR: INR: 1.06 (ref 0.00–1.49)

## 2012-04-22 MED ORDER — DEXTROSE 5 % IV SOLN
2.0000 g | INTRAVENOUS | Status: AC
  Administered 2012-04-23: 2 g via INTRAVENOUS
  Filled 2012-04-22: qty 20

## 2012-04-22 MED ORDER — DEXTROSE 5 % IV SOLN: 2.0000 g | INTRAVENOUS | Status: DC

## 2012-04-22 NOTE — Pre-Procedure Instructions (Signed)
[place]  04/22/2012   Your procedure is scheduled on:  Thursday July 18  Report to [facility] at [place].  Call this number if you have problems the morning of surgery: [phone]   Remember:   Do not eat food:After Midnight.  May have clear liquids:until Midnight .   [name]  Take these medicines the morning of surgery with A SIP OF WATER: *Clindamycin**   Do not wear jewelry, make-up or nail polish.  Do not wear lotions, powders, or perfumes. You may wear deodorant.  Do not shave 48 hours prior to surgery. Men may shave face and neck.  Do not bring valuables to the hospital.  Contacts, dentures or bridgework may not be worn into surgery.  Leave suitcase in the car. After surgery it may be brought to your room.  For patients admitted to the hospital, checkout time is 11:00 AM the day of discharge.   Patients discharged the day of surgery will not be allowed to drive home.  Name and phone number of your driver: n/a  Special Instructions: Incentive Spirometry - Practice and bring it with you on the day of surgery. and CHG Shower Use Special Wash: 1/2 bottle night before surgery and 1/2 bottle morning of surgery.   Please read over the following fact sheets that you were given: Pain Booklet, Coughing and Deep Breathing, MRSA Information and Surgical Site Infection Prevention

## 2012-04-23 ENCOUNTER — Encounter (HOSPITAL_COMMUNITY): Payer: Self-pay | Admitting: Certified Registered"

## 2012-04-23 ENCOUNTER — Encounter (HOSPITAL_COMMUNITY)
Admission: RE | Disposition: A | Discharge status: Home / Self Care | Payer: Self-pay | Source: Ambulatory Visit | Attending: Orthopedic Surgery

## 2012-04-23 ENCOUNTER — Ambulatory Visit (HOSPITAL_COMMUNITY)
Admission: RE | Admit: 2012-04-23 | Discharge: 2012-04-23 | Disposition: A | Discharge status: Home / Self Care | Payer: Medicaid Other | Source: Ambulatory Visit | Attending: Orthopedic Surgery | Admitting: Orthopedic Surgery

## 2012-04-23 ENCOUNTER — Inpatient Hospital Stay (HOSPITAL_COMMUNITY): Payer: Medicaid Other | Admitting: Certified Registered"

## 2012-04-23 DIAGNOSIS — Z01818 Encounter for other preprocedural examination: Secondary | ICD-10-CM | POA: Insufficient documentation

## 2012-04-23 DIAGNOSIS — L03116 Cellulitis of left lower limb: Secondary | ICD-10-CM

## 2012-04-23 DIAGNOSIS — F17200 Nicotine dependence, unspecified, uncomplicated: Secondary | ICD-10-CM | POA: Insufficient documentation

## 2012-04-23 DIAGNOSIS — I10 Essential (primary) hypertension: Secondary | ICD-10-CM | POA: Insufficient documentation

## 2012-04-23 DIAGNOSIS — Z01812 Encounter for preprocedural laboratory examination: Secondary | ICD-10-CM | POA: Insufficient documentation

## 2012-04-23 DIAGNOSIS — L02619 Cutaneous abscess of unspecified foot: Secondary | ICD-10-CM | POA: Insufficient documentation

## 2012-04-23 DIAGNOSIS — E119 Type 2 diabetes mellitus without complications: Secondary | ICD-10-CM | POA: Insufficient documentation

## 2012-04-23 DIAGNOSIS — Z01810 Encounter for preprocedural cardiovascular examination: Secondary | ICD-10-CM | POA: Insufficient documentation

## 2012-04-23 DIAGNOSIS — Z01811 Encounter for preprocedural respiratory examination: Secondary | ICD-10-CM | POA: Insufficient documentation

## 2012-04-23 HISTORY — PX: I & D EXTREMITY: SHX5045

## 2012-04-23 SURGERY — IRRIGATION AND DEBRIDEMENT EXTREMITY
Anesthesia: General | Site: Foot | Laterality: Left | Wound class: Dirty or Infected

## 2012-04-23 MED ORDER — FENTANYL CITRATE 0.05 MG/ML IJ SOLN
INTRAMUSCULAR | Status: DC | PRN
  Administered 2012-04-23: 50 ug via INTRAVENOUS

## 2012-04-23 MED ORDER — SODIUM CHLORIDE 0.9 % IR SOLN
Status: DC | PRN
  Administered 2012-04-23: 1000 mL

## 2012-04-23 MED ORDER — LACTATED RINGERS IV SOLN
INTRAVENOUS | Status: DC | PRN
  Administered 2012-04-23: 07:00:00 via INTRAVENOUS

## 2012-04-23 MED ORDER — PROMETHAZINE HCL 25 MG/ML IJ SOLN: 6.2500 mg | INTRAMUSCULAR | Status: DC | PRN

## 2012-04-23 MED ORDER — MEPERIDINE HCL 25 MG/ML IJ SOLN: 6.2500 mg | INTRAMUSCULAR | Status: DC | PRN

## 2012-04-23 MED ORDER — DOXYCYCLINE HYCLATE 100 MG PO TABS: 100.0000 mg | ORAL_TABLET | Freq: Two times a day (BID) | ORAL | Status: AC

## 2012-04-23 MED ORDER — HYDROMORPHONE HCL PF 1 MG/ML IJ SOLN
0.2500 mg | INTRAMUSCULAR | Status: DC | PRN
  Administered 2012-04-23 (×3): 0.5 mg via INTRAVENOUS

## 2012-04-23 MED ORDER — MIDAZOLAM HCL 5 MG/5ML IJ SOLN
INTRAMUSCULAR | Status: DC | PRN
  Administered 2012-04-23: 2 mg via INTRAVENOUS

## 2012-04-23 MED ORDER — CEFAZOLIN SODIUM-DEXTROSE 2-3 GM-% IV SOLR
INTRAVENOUS | Status: AC
  Filled 2012-04-23: qty 50

## 2012-04-23 MED ORDER — HYDROMORPHONE HCL PF 1 MG/ML IJ SOLN
INTRAMUSCULAR | Status: AC
  Filled 2012-04-23: qty 1

## 2012-04-23 MED ORDER — LIDOCAINE HCL (CARDIAC) 20 MG/ML IV SOLN
INTRAVENOUS | Status: DC | PRN
  Administered 2012-04-23: 50 mg via INTRAVENOUS

## 2012-04-23 MED ORDER — ONDANSETRON HCL 4 MG/2ML IJ SOLN
INTRAMUSCULAR | Status: DC | PRN
  Administered 2012-04-23: 4 mg via INTRAVENOUS

## 2012-04-23 MED ORDER — PROPOFOL 10 MG/ML IV EMUL
INTRAVENOUS | Status: DC | PRN
  Administered 2012-04-23: 200 mg via INTRAVENOUS

## 2012-04-23 SURGICAL SUPPLY — 49 items
BANDAGE GAUZE ELAST BULKY 4 IN (GAUZE/BANDAGES/DRESSINGS) ×2 IMPLANT
BLADE SURG 10 STRL SS (BLADE) ×2 IMPLANT
BNDG COHESIVE 4X5 TAN STRL (GAUZE/BANDAGES/DRESSINGS) IMPLANT
BNDG COHESIVE 6X5 TAN STRL LF (GAUZE/BANDAGES/DRESSINGS) ×2 IMPLANT
BNDG ESMARK 4X9 LF (GAUZE/BANDAGES/DRESSINGS) ×2 IMPLANT
BNDG GAUZE STRTCH 6 (GAUZE/BANDAGES/DRESSINGS) IMPLANT
CANISTER SUCTION 2500CC (MISCELLANEOUS) ×2 IMPLANT
CLOTH BEACON ORANGE TIMEOUT ST (SAFETY) ×2 IMPLANT
COTTON STERILE ROLL (GAUZE/BANDAGES/DRESSINGS) IMPLANT
COVER SURGICAL LIGHT HANDLE (MISCELLANEOUS) ×2 IMPLANT
CUFF TOURNIQUET SINGLE 18IN (TOURNIQUET CUFF) IMPLANT
CUFF TOURNIQUET SINGLE 24IN (TOURNIQUET CUFF) IMPLANT
CUFF TOURNIQUET SINGLE 34IN LL (TOURNIQUET CUFF) IMPLANT
CUFF TOURNIQUET SINGLE 44IN (TOURNIQUET CUFF) IMPLANT
DRAIN PENROSE 1/4X12 LTX STRL (WOUND CARE) ×2 IMPLANT
DRAPE U-SHAPE 47X51 STRL (DRAPES) ×2 IMPLANT
DRSG ADAPTIC 3X8 NADH LF (GAUZE/BANDAGES/DRESSINGS) ×2 IMPLANT
DRSG PAD ABDOMINAL 8X10 ST (GAUZE/BANDAGES/DRESSINGS) ×2 IMPLANT
DURAPREP 26ML APPLICATOR (WOUND CARE) ×2 IMPLANT
ELECT CAUTERY BLADE 6.4 (BLADE) ×2 IMPLANT
ELECT REM PT RETURN 9FT ADLT (ELECTROSURGICAL) ×2
ELECTRODE REM PT RTRN 9FT ADLT (ELECTROSURGICAL) ×1 IMPLANT
GLOVE BIOGEL PI IND STRL 9 (GLOVE) ×1 IMPLANT
GLOVE BIOGEL PI INDICATOR 9 (GLOVE) ×1
GLOVE SURG ORTHO 9.0 STRL STRW (GLOVE) IMPLANT
GOWN PREVENTION PLUS XLARGE (GOWN DISPOSABLE) ×2 IMPLANT
GOWN SRG XL XLNG 56XLVL 4 (GOWN DISPOSABLE) ×1 IMPLANT
GOWN STRL NON-REIN XL XLG LVL4 (GOWN DISPOSABLE) ×1
HANDPIECE INTERPULSE COAX TIP (DISPOSABLE)
KIT BASIN OR (CUSTOM PROCEDURE TRAY) ×2 IMPLANT
KIT ROOM TURNOVER OR (KITS) ×2 IMPLANT
MANIFOLD NEPTUNE II (INSTRUMENTS) IMPLANT
NS IRRIG 1000ML POUR BTL (IV SOLUTION) ×2 IMPLANT
PACK ORTHO EXTREMITY (CUSTOM PROCEDURE TRAY) ×2 IMPLANT
PAD ARMBOARD 7.5X6 YLW CONV (MISCELLANEOUS) ×2 IMPLANT
PADDING CAST COTTON 6X4 STRL (CAST SUPPLIES) IMPLANT
SET HNDPC FAN SPRY TIP SCT (DISPOSABLE) IMPLANT
SPONGE GAUZE 4X4 12PLY (GAUZE/BANDAGES/DRESSINGS) ×2 IMPLANT
SPONGE LAP 18X18 X RAY DECT (DISPOSABLE) ×2 IMPLANT
STOCKINETTE IMPERVIOUS 9X36 MD (GAUZE/BANDAGES/DRESSINGS) ×2 IMPLANT
SUT ETHILON 2 0 PSLX (SUTURE) ×2 IMPLANT
SWAB COLLECTION DEVICE MRSA (MISCELLANEOUS) ×2 IMPLANT
TOWEL OR 17X24 6PK STRL BLUE (TOWEL DISPOSABLE) ×2 IMPLANT
TOWEL OR 17X26 10 PK STRL BLUE (TOWEL DISPOSABLE) ×2 IMPLANT
TUBE ANAEROBIC SPECIMEN COL (MISCELLANEOUS) ×2 IMPLANT
TUBE CONNECTING 12X1/4 (SUCTIONS) ×2 IMPLANT
UNDERPAD 30X30 INCONTINENT (UNDERPADS AND DIAPERS) ×2 IMPLANT
WATER STERILE IRR 1000ML POUR (IV SOLUTION) IMPLANT
YANKAUER SUCT BULB TIP NO VENT (SUCTIONS) ×2 IMPLANT

## 2012-04-23 NOTE — Anesthesia Postprocedure Evaluation (Signed)
  Anesthesia Post-op Note  Patient: [name]  Procedure(s) Performed: Procedure(s) (LRB): IRRIGATION AND DEBRIDEMENT EXTREMITY (Left)  Patient Location: PACU  Anesthesia Type: General  Level of Consciousness: awake  Airway and Oxygen Therapy: Patient Spontanous Breathing  Post-op Pain: none  Post-op Assessment: Post-op Vital signs reviewed  Post-op Vital Signs: stable  Complications: No apparent anesthesia complications

## 2012-04-23 NOTE — H&P (Signed)
[name] is an 46 y.o. male.   Chief Complaint: Persistent abscess infection left foot secondary to foreign body. HPI: Patient is a 46 year old gentleman who stepped on an object and had a foreign body penetrated the plantar aspect of the left foot. Patient was initially admitted placed on IV antibiotics and has had recurrent infection secondary to retained foreign body. Patient presents at this time for removal of deep retained foreign body.  Past Medical History  Diagnosis Date  . Hypertension   . Diabetes mellitus     Type 2 DM  x 24 years  . Foreign body in left foot     with  infection    Past Surgical History  Procedure Date  . No past surgeries     Family History  Problem Relation Age of Onset  . Diabetes type II Mother   . Diabetes type II Sister    Social History:  reports that he has been smoking Cigarettes.  He has never used smokeless tobacco. He reports that he drinks about 4.8 ounces of alcohol per week. He reports that he does not use illicit drugs.  Allergies:  Allergies  Allergen Reactions  . Latex Rash    Pt states he he allergic to latex condoms    Medications Prior to Admission  Medication Sig Dispense Refill  . clindamycin (CLEOCIN) 300 MG capsule Take 300 mg by mouth every 8 (eight) hours.      . insulin glargine (LANTUS) 100 UNIT/ML injection Inject 6 Units into the skin daily.      [name] levofloxacin (LEVAQUIN) 500 MG tablet Take 500 mg by mouth daily.      [name] lisinopril (PRINIVIL,ZESTRIL) 5 MG tablet Take 5 mg by mouth daily.      . metFORMIN (GLUCOPHAGE) 500 MG tablet Take 500 mg by mouth 2 (two) times daily with a meal.      . mupirocin cream (BACTROBAN) 2 % Apply 1 application topically 2 (two) times daily.        Results for orders placed during the hospital encounter of 04/22/12 (from the past 48 hour(s))  APTT     Status: Normal   Collection Time   04/22/12  9:11 AM      Component Value Range Comment   aPTT 28  24 - 37 seconds   PROTIME-INR      Status: Normal   Collection Time   04/22/12  9:11 AM      Component Value Range Comment   Prothrombin Time 14.0  11.6 - 15.2 seconds    INR 1.06  0.00 - 1.49    Dg Chest 2 View  04/22/2012  *RADIOLOGY REPORT*  Clinical Data: Preoperative assessment for irrigation and debridement of left foot and removal of foreign body, history hypertension, diabetes, smoking  CHEST - 2 VIEW  Comparison: 03/18/2010  Findings: Normal heart size, mediastinal contours, and pulmonary vascularity. Lungs clear. No pleural effusion or pneumothorax. No acute osseous findings.  IMPRESSION: No acute abnormalities.  Original Report Authenticated By: Burnetta Sabin, M.D.    Review of Systems  All other systems reviewed and are negative.    There were no vitals taken for this visit. Physical Exam  On examination patient has redness cellulitis of the left forefoot. He does have palpable pulses there is no purulent drainage there is a puncture wound from the foreign body. Assessment/Plan Assessment: Cellulitis and infection left forefoot secondary to foreign body puncture wound.  Plan: We will plan for open irrigation and  debridement of the left foot. Risks and benefits of surgery were discussed including persistent infection. Patient states he understands was pursued this time.  Malavika Lira V 04/23/2012, 6:16 AM

## 2012-04-23 NOTE — Anesthesia Procedure Notes (Signed)
Procedure Name: LMA Insertion Date/Time: 04/23/2012 7:55 AM Performed by: Manuela Schwartz B Pre-anesthesia Checklist: Patient identified, Emergency Drugs available, Suction available, Patient being monitored and Timeout performed Patient Re-evaluated:Patient Re-evaluated prior to inductionOxygen Delivery Method: Circle system utilized Preoxygenation: Pre-oxygenation with 100% oxygen Intubation Type: IV induction LMA: LMA inserted LMA Size: 4.0 Number of attempts: 1 Placement Confirmation: positive ETCO2 and breath sounds checked- equal and bilateral Tube secured with: Tape Dental Injury: Teeth and Oropharynx as per pre-operative assessment

## 2012-04-23 NOTE — Op Note (Signed)
OPERATIVE REPORT  DATE OF SURGERY: 04/23/2012  PATIENT:  [name],  46 y.o. male  PRE-OPERATIVE DIAGNOSIS:  Abscess Left foot  POST-OPERATIVE DIAGNOSIS:  Abscess left foot  PROCEDURE:  Procedure(s): IRRIGATION AND DEBRIDEMENT EXTREMITY  SURGEON:  Surgeon(s): Newt Minion, MD  ANESTHESIA:   general  EBL:  Minimal ML  SPECIMEN:  No Specimen  TOURNIQUET:  * No tourniquets in log *  PROCEDURE DETAILS: Patient is a 46 year old gentleman stepped on a foreign body beneath the left great toe MTP joint. He developed cellulitis he was admitted hospitalized placed on IV antibiotics and discharged on by mouth antibiotics. Patient has had persistent cellulitis drainage and presents at this time for open irrigation and debridement. Risks and benefits were discussed including infection neurovascular injury nonhealing of the wound need for additional surgery. Patient states he understands and wishes to proceed at this time. Description of procedure patient was brought to the OR and underwent a general anesthetic. After adequate levels of anesthesia were obtained patient's left lower extremity was prepped using DuraPrep and draped into a sterile field. A midline incision was made on the plantar aspect of the MTP joint directly in line with his traumatic wound. There was a purulent abscess deep within the soft tissue. Cultures were obtained. The necrotic tissue was removed. The wound is irrigated with normal saline. Esmarch was released after proximally 6 minutes hemostasis was obtained. A Penrose drain was placed to with the wound open. The incision was closed using 2-0 nylon. The wound is covered with Adaptic orthopedic sponges AB dressing Kerlix and Coban. Patient was extubated taken to the PACU in stable condition plan for discharge to home prescription for doxycycline followup in one week.  PLAN OF CARE: Discharge to home after PACU  PATIENT DISPOSITION:  PACU - hemodynamically stable.     Newt Minion, MD 04/23/2012 8:21 AM

## 2012-04-23 NOTE — Transfer of Care (Signed)
Immediate Anesthesia Transfer of Care Note  Patient: [name]  Procedure(s) Performed: Procedure(s) (LRB): IRRIGATION AND DEBRIDEMENT EXTREMITY (Left)  Patient Location: PACU  Anesthesia Type: General  Level of Consciousness: awake, alert , oriented and patient cooperative  Airway & Oxygen Therapy: Patient Spontanous Breathing and Patient connected to nasal cannula oxygen  Post-op Assessment: Report given to PACU RN, Post -op Vital signs reviewed and stable and Patient moving all extremities  Post vital signs: Reviewed and stable  Complications: No apparent anesthesia complications

## 2012-04-23 NOTE — Progress Notes (Signed)
Orthopedic Tech Progress Note Patient Details:  [name] 23-Mar-1966 [id number]  Ortho Devices Type of Ortho Device: Postop boot Ortho Device/Splint Interventions: Application   [name] 04/23/2012, 9:44 AM

## 2012-04-23 NOTE — Anesthesia Preprocedure Evaluation (Addendum)
Anesthesia Evaluation  Patient identified by MRN, date of birth, ID band  Reviewed: H&P , NPO status , Patient's Chart, lab work & pertinent test results  History of Anesthesia Complications Negative for: history of anesthetic complications  Airway Mallampati: II  Neck ROM: Full    Dental  (+) Poor Dentition   Pulmonary  breath sounds clear to auscultation        Cardiovascular hypertension, Rhythm:Regular Rate:Normal     Neuro/Psych    GI/Hepatic   Endo/Other    Renal/GU      Musculoskeletal   Abdominal (+) + obese,   Peds  Hematology   Anesthesia Other Findings   Reproductive/Obstetrics                          Anesthesia Physical Anesthesia Plan  ASA: II  Anesthesia Plan: General   Post-op Pain Management:    Induction: Intravenous  Airway Management Planned: LMA  Additional Equipment:   Intra-op Plan:   Post-operative Plan: Extubation in OR  Informed Consent:   Dental advisory given  Plan Discussed with: CRNA and Surgeon  Anesthesia Plan Comments:         Anesthesia Quick Evaluation

## 2012-04-24 ENCOUNTER — Encounter (HOSPITAL_COMMUNITY): Payer: Self-pay | Admitting: Orthopedic Surgery

## 2012-04-27 LAB — CULTURE, ROUTINE-ABSCESS: Gram Stain: NONE SEEN

## 2012-04-28 LAB — ANAEROBIC CULTURE

## 2012-05-20 ENCOUNTER — Ambulatory Visit: Payer: Self-pay | Admitting: Family Medicine

## 2012-05-22 ENCOUNTER — Encounter: Payer: Self-pay | Admitting: Family Medicine

## 2012-05-22 ENCOUNTER — Ambulatory Visit (INDEPENDENT_AMBULATORY_CARE_PROVIDER_SITE_OTHER): Payer: Medicaid Other | Admitting: Family Medicine

## 2012-05-22 VITALS — BP 158/95 | HR 87 | Temp 98.7°F | Ht 71.0 in | Wt 204.5 lb

## 2012-05-22 DIAGNOSIS — E119 Type 2 diabetes mellitus without complications: Secondary | ICD-10-CM

## 2012-05-22 DIAGNOSIS — I10 Essential (primary) hypertension: Secondary | ICD-10-CM

## 2012-05-22 DIAGNOSIS — L02619 Cutaneous abscess of unspecified foot: Secondary | ICD-10-CM

## 2012-05-22 DIAGNOSIS — L03116 Cellulitis of left lower limb: Secondary | ICD-10-CM

## 2012-05-22 DIAGNOSIS — L03119 Cellulitis of unspecified part of limb: Secondary | ICD-10-CM

## 2012-05-22 MED ORDER — INSULIN GLARGINE 100 UNIT/ML ~~LOC~~ SOLN: 6.0000 [IU] | Freq: Every day | SUBCUTANEOUS | Status: DC

## 2012-05-22 MED ORDER — METFORMIN HCL 500 MG PO TABS: 500.0000 mg | ORAL_TABLET | Freq: Two times a day (BID) | ORAL | Status: DC

## 2012-05-22 MED ORDER — "INSULIN SYRINGE/NEEDLE 28G X 1/2"" 1 ML MISC": Status: DC

## 2012-05-22 MED ORDER — LISINOPRIL 10 MG PO TABS: 10.0000 mg | ORAL_TABLET | Freq: Every day | ORAL | Status: DC

## 2012-05-22 NOTE — Patient Instructions (Signed)
It was great to meet you today! We will arrange for you to see our diabetes educator.  Expect to get a phone call some time in the next several business days. Make certain to give [place] a call if you notice your foot starting to get red or painful again. Come back to see me in 3-4 weeks so we can check your blood pressure.

## 2012-06-01 ENCOUNTER — Encounter: Payer: Self-pay | Admitting: Family Medicine

## 2012-06-01 ENCOUNTER — Ambulatory Visit (INDEPENDENT_AMBULATORY_CARE_PROVIDER_SITE_OTHER): Payer: Medicaid Other | Admitting: Family Medicine

## 2012-06-01 VITALS — BP 126/85 | HR 82 | Temp 97.9°F | Ht 71.0 in

## 2012-06-01 DIAGNOSIS — E1169 Type 2 diabetes mellitus with other specified complication: Secondary | ICD-10-CM

## 2012-06-01 DIAGNOSIS — E11628 Type 2 diabetes mellitus with other skin complications: Secondary | ICD-10-CM

## 2012-06-01 DIAGNOSIS — E1122 Type 2 diabetes mellitus with diabetic chronic kidney disease: Secondary | ICD-10-CM

## 2012-06-01 DIAGNOSIS — L089 Local infection of the skin and subcutaneous tissue, unspecified: Secondary | ICD-10-CM

## 2012-06-01 DIAGNOSIS — E119 Type 2 diabetes mellitus without complications: Secondary | ICD-10-CM

## 2012-06-01 HISTORY — DX: Type 2 diabetes mellitus with diabetic chronic kidney disease: E11.22

## 2012-06-01 LAB — CBC WITH DIFFERENTIAL/PLATELET
Basophils Relative: 1 % (ref 0–1)
Eosinophils Absolute: 0.3 10*3/uL (ref 0.0–0.7)
Eosinophils Relative: 5 % (ref 0–5)
Hemoglobin: 15.1 g/dL (ref 13.0–17.0)
Lymphs Abs: 1.6 10*3/uL (ref 0.7–4.0)
MCH: 31.1 pg (ref 26.0–34.0)
MCHC: 35.1 g/dL (ref 30.0–36.0)
MCV: 88.7 fL (ref 78.0–100.0)
Monocytes Relative: 9 % (ref 3–12)
Neutrophils Relative %: 58 % (ref 43–77)
RBC: 4.85 MIL/uL (ref 4.22–5.81)

## 2012-06-01 LAB — GLUCOSE, CAPILLARY: Glucose-Capillary: 175 mg/dL — ABNORMAL HIGH (ref 70–99)

## 2012-06-01 MED ORDER — CLINDAMYCIN HCL 300 MG PO CAPS: 300.0000 mg | ORAL_CAPSULE | Freq: Three times a day (TID) | ORAL | Status: AC

## 2012-06-01 NOTE — Progress Notes (Signed)
  Subjective:    Patient ID: [name], male    DOB: 04-14-1966, 46 y.o.   MRN: [id number]  HPI  1. Left foot wound. 46 yo poorly controlled diabetic. Had foreign body removal (steel), debridement of left foot in July. This had been healing well, finished a course of clindamycin 2 weeks ago. He was doing pressure washing on Wednesday (five days ago), and feet were saturated with water. Thinks his wound opened back up. Since then worsened pain, now oozing pus and blood. Mild swelling. Denies nausea, emesis, hyperglycemia, neuropathy, fever, chills, malaise.  Review of Systems See HPI otherwise negative.  reports that he has been smoking Cigarettes.  He has never used smokeless tobacco.    Objective:   Physical Exam  Vitals reviewed. Constitutional: He is oriented to person, place, and time. He appears well-developed and well-nourished.  HENT:  Head: Normocephalic and atraumatic.  Eyes: EOM are normal.  Cardiovascular: Normal rate, regular rhythm and normal heart sounds.   Pulmonary/Chest: Effort normal.  Musculoskeletal: He exhibits edema and tenderness.       Left great toe metatarsal pad with 1 cm crack in skin, drains serosanguinous fluid. General subcutaneous edema and blister opens during exam with purulence expressed. Generalized erythema and mild warmth on left 1st MTP area.   ROM and sensation intact. DP pulses symmetric.  Neurological: He is alert and oriented to person, place, and time.  Psychiatric: He has a normal mood and affect.       Assessment & Plan:

## 2012-06-01 NOTE — Addendum Note (Signed)
Addended by: [place], Carmin Dibartolo on: 06/01/2012 03:21 PM   Modules accepted: Orders

## 2012-06-01 NOTE — Assessment & Plan Note (Signed)
Lab Results  Component Value Date   HGBA1C 10.0* 04/15/2012   His fasting CBG is 170 today. Will f/u with PCP once acute issues are improved. Continue current lantus dose. Advised to monitor for hyperglycemia in setting of infection.

## 2012-06-01 NOTE — Patient Instructions (Addendum)
Nice to meet you. Concerned you have a new infection in your foot now. Please start taking clindamycin today. Go to see Dr. Sharol Given in clinic today at 12:00. Keep the foot dry and protected. Dress with guaze twice daily. If you have fevers, nausea, spread of redness or pain the go to the emergency room.  Diabetes and Foot Care Diabetes may cause you to have a poor blood supply (circulation) to your legs and feet. Because of this, the skin may be thinner, break easier, and heal more slowly. You also may have nerve damage in your legs and feet causing decreased feeling. You may not notice minor injuries to your feet that could lead to serious problems or infections. Taking care of your feet is one of the most important things you can do for yourself.  HOME CARE INSTRUCTIONS  Do not go barefoot. Bare feet are easily injured.   Check your feet daily for blisters, cuts, and redness.   Wash your feet with warm water (not hot) and mild soap. Pat your feet and between your toes until completely dry.   Apply a moisturizing lotion that does not contain alcohol or petroleum jelly to the dry skin on your feet and to dry brittle toenails. Do not put it between your toes.   Trim your toenails straight across. Do not dig under them or around the cuticle.   Do not cut corns or calluses, or try to remove them with medicine.   Wear clean cotton socks or stockings every day. Make sure they are not too tight. Do not wear knee high stockings since they may decrease blood flow to your legs.   Wear leather shoes that fit properly and have enough cushioning. To break in new shoes, wear them just a few hours a day to avoid injuring your feet.   Wear shoes at all times, even in the house.   Do not cross your legs. This may decrease the blood flow to your feet.   If you find a minor scrape, cut, or break in the skin on your feet, keep it and the skin around it clean and dry. These areas may be cleansed with mild  soap and water. Do not use peroxide, alcohol, iodine or Merthiolate.   When you remove an adhesive bandage, be sure not to harm the skin around it.   If you have a wound, look at it several times a day to make sure it is healing.   Do not use heating pads or hot water bottles. Burns can occur. If you have lost feeling in your feet or legs, you may not know it is happening until it is too late.   Report any cuts, sores or bruises to your caregiver. Do not wait!  SEEK MEDICAL CARE IF:   You have an injury that is not healing or you notice redness, numbness, burning, or tingling.   Your feet always feel cold.   You have pain or cramps in your legs and feet.  SEEK IMMEDIATE MEDICAL CARE IF:   There is increasing redness, swelling, or increasing pain in the wound.   There is a red line that goes up your leg.   Pus is coming from a wound.   You develop an unexplained oral temperature above 102 F (38.9 C), or as your caregiver suggests.   You notice a bad smell coming from an ulcer or wound.  MAKE SURE YOU:   Understand these instructions.   Will watch your condition.  Will get help right away if you are not doing well or get worse.  Document Released: 09/20/2000 Document Revised: 09/12/2011 Document Reviewed: 03/29/2009 [facility] Patient Information 2012 [place].

## 2012-06-01 NOTE — Addendum Note (Signed)
Addended by: [place], Jeran Hiltz on: 06/01/2012 03:23 PM   Modules accepted: Orders

## 2012-06-01 NOTE — Assessment & Plan Note (Addendum)
Suspect recurrent infection after breaking open of skin with feet being wet. Now there appears to be at least a deep cellulitis with subcutaneous pus, concern for underlying abscess, possible joint involvement or osteomyelitis. Will check CBC and ESR in clinic now. Rx for clindamycin based on previous culture showing MSSA, though broader coverage is likely warranted, but ultimate treatment is likely repeat drainage/debridement. Will send over to ortho clinic Dr. Sharol Given who performed original debridement for re-evaluation today. Discussed red flags to present to ER. Patient understands importance of follow up.

## 2012-06-04 NOTE — Progress Notes (Signed)
Patient ID: [name], male   DOB: [birth year], 46 y.o.   MRN: [id number] Subjective: The patient is a 46 y.o. year old male who presents today for initial appointment.  1. Diabetes: Long standing, however patient has not been on meds until recently due to financial concerns.  With hospitalization recently for left foot cellulitis he was restarted on Lantus and Metformin.  No problems with hypoglycemia.    2. Foot cellulitis: Patient was hospitalized for left foot with piece of metal in it (work related accident).  He was initially treated with just antibiotics but, when cellulitis did not resolve, he required I+D.  Since then he has finished a course of antibiotics and reports that his foot is healing well with no drainage, fevers, chills, nausea/vomiting/diarrhea.  No redness in surrounding area.  3. Hypertension: Started on lisinopril in hospital.  No cp/sob/headaches.  Tolerating well with no cough.  Patient's past medical, social, and family history were reviewed and updated as appropriate. History  Substance Use Topics  . Smoking status: Current Some Day Smoker    Types: Cigarettes  . Smokeless tobacco: Never Used  . Alcohol Use: 4.8 oz/week    6 Cans of beer, 2 Shots of liquor per week     weekends.   Objective:  Filed Vitals:   05/22/12 1629  BP: 158/95  Pulse: 87  Temp: 98.7 F (37.1 C)   Gen: NAD CV: RRR, no murmurs Resp: CTABL Ext: Left foot incision healing well with no signs of erythema, drainage, or significant pain on palpation.  Assessment/Plan:  Please also see individual problems in problem list for problem-specific plans.

## 2012-06-04 NOTE — Assessment & Plan Note (Signed)
Appears resolved.  Patient notified of red flags that should prompt return.

## 2012-06-04 NOTE — Assessment & Plan Note (Signed)
Will hold off on changing medications until patient has insurance approved.  Will also arrange for diabetes education as patient reports that he dos not know much about dietary modifications that are needed.  Patient did not bring his meter with him so I was unable to go over readings.  Have asked him to do this at his follow up appointment.

## 2012-06-04 NOTE — Assessment & Plan Note (Signed)
Mildly hypertensive at this time.  As patient is going to be getting insurance shortly, will hold off on making any changes to his medications until this is approved.

## 2012-06-23 ENCOUNTER — Ambulatory Visit: Payer: Medicaid Other | Admitting: Family Medicine

## 2012-07-07 ENCOUNTER — Ambulatory Visit: Payer: Medicaid Other | Admitting: Dietician

## 2012-07-17 ENCOUNTER — Telehealth: Payer: Self-pay | Admitting: Family Medicine

## 2012-07-17 MED ORDER — "INSULIN SYRINGE-NEEDLE U-100 31G X 15/64"" 1 ML MISC": 1.0000 | Freq: Every day | Status: DC

## 2012-07-17 NOTE — Telephone Encounter (Signed)
States that the needles that he is using are too big and wants to know if he can have smaller ones.  [facility] [address]

## 2012-08-03 ENCOUNTER — Ambulatory Visit: Payer: Medicaid Other | Admitting: Dietician

## 2012-08-17 ENCOUNTER — Encounter: Payer: Self-pay | Admitting: Home Health Services

## 2012-08-19 ENCOUNTER — Encounter: Payer: Self-pay | Admitting: Home Health Services

## 2012-08-25 ENCOUNTER — Ambulatory Visit (HOSPITAL_COMMUNITY)
Admission: RE | Admit: 2012-08-25 | Discharge: 2012-08-25 | Disposition: A | Discharge status: Home / Self Care | Payer: Medicaid Other | Source: Ambulatory Visit | Attending: Family Medicine | Admitting: Family Medicine

## 2012-08-25 ENCOUNTER — Telehealth: Payer: Self-pay | Admitting: Family Medicine

## 2012-08-25 ENCOUNTER — Encounter: Payer: Self-pay | Admitting: Family Medicine

## 2012-08-25 ENCOUNTER — Telehealth: Payer: Self-pay | Admitting: *Deleted

## 2012-08-25 ENCOUNTER — Ambulatory Visit (INDEPENDENT_AMBULATORY_CARE_PROVIDER_SITE_OTHER): Payer: Medicaid Other | Admitting: Family Medicine

## 2012-08-25 DIAGNOSIS — R079 Chest pain, unspecified: Secondary | ICD-10-CM

## 2012-08-25 DIAGNOSIS — Z043 Encounter for examination and observation following other accident: Secondary | ICD-10-CM | POA: Insufficient documentation

## 2012-08-25 MED ORDER — CYCLOBENZAPRINE HCL 10 MG PO TABS: 10.0000 mg | ORAL_TABLET | Freq: Three times a day (TID) | ORAL | Status: DC | PRN

## 2012-08-25 MED ORDER — TRAMADOL HCL 50 MG PO TABS: 50.0000 mg | ORAL_TABLET | Freq: Three times a day (TID) | ORAL | Status: DC | PRN

## 2012-08-25 MED ORDER — HYDROCODONE-ACETAMINOPHEN 5-325 MG PO TABS: 1.0000 | ORAL_TABLET | Freq: Four times a day (QID) | ORAL | Status: DC | PRN

## 2012-08-25 NOTE — Telephone Encounter (Signed)
X-ray with what appears to be fx of 10th rib on R side which does correlate with his pain.  Will send in rx for hydrocodone to give him a little better pain control.

## 2012-08-25 NOTE — Telephone Encounter (Signed)
Fwd. To Dr.Matthews to address. Javier Glazier, Gerrit Heck
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
## 2012-08-25 NOTE — Telephone Encounter (Signed)
Is asking about results of xray - wants to know if he has any cracked ribs
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
## 2012-08-25 NOTE — Telephone Encounter (Signed)
Called pt and informed of x-ray. Faxed rx to [facility]. Javier Glazier, Gerrit Heck

## 2012-09-11 ENCOUNTER — Ambulatory Visit (INDEPENDENT_AMBULATORY_CARE_PROVIDER_SITE_OTHER): Payer: Medicaid Other | Admitting: Family Medicine

## 2012-09-11 ENCOUNTER — Encounter: Payer: Self-pay | Admitting: Family Medicine

## 2012-09-11 ENCOUNTER — Ambulatory Visit (HOSPITAL_COMMUNITY)
Admission: RE | Admit: 2012-09-11 | Discharge: 2012-09-11 | Disposition: A | Discharge status: Home / Self Care | Payer: Medicaid Other | Source: Ambulatory Visit | Attending: Family Medicine | Admitting: Family Medicine

## 2012-09-11 ENCOUNTER — Telehealth: Payer: Self-pay | Admitting: Family Medicine

## 2012-09-11 VITALS — BP 175/97 | HR 83 | Ht 71.0 in | Wt 208.9 lb

## 2012-09-11 DIAGNOSIS — M7989 Other specified soft tissue disorders: Secondary | ICD-10-CM

## 2012-09-11 DIAGNOSIS — E119 Type 2 diabetes mellitus without complications: Secondary | ICD-10-CM | POA: Insufficient documentation

## 2012-09-11 DIAGNOSIS — I10 Essential (primary) hypertension: Secondary | ICD-10-CM

## 2012-09-11 DIAGNOSIS — IMO0001 Reserved for inherently not codable concepts without codable children: Secondary | ICD-10-CM

## 2012-09-11 DIAGNOSIS — IMO0002 Reserved for concepts with insufficient information to code with codable children: Secondary | ICD-10-CM

## 2012-09-11 DIAGNOSIS — E1165 Type 2 diabetes mellitus with hyperglycemia: Secondary | ICD-10-CM

## 2012-09-11 LAB — CBC
HCT: 41.9 % (ref 39.0–52.0)
Platelets: 155 10*3/uL (ref 150–400)
RDW: 13.1 % (ref 11.5–15.5)

## 2012-09-11 LAB — BASIC METABOLIC PANEL
Chloride: 101 mEq/L (ref 96–112)
Potassium: 4.3 mEq/L (ref 3.5–5.3)
Sodium: 139 mEq/L (ref 135–145)

## 2012-09-11 NOTE — Progress Notes (Signed)
S: Pt comes in today for SDA for swollen left foot.  Patient is a diabetic, last A1c 10.0 in 04/2012, currently sugars run 170-250.  Patient reports foot surgery in 04/2012 for foreign body removal (piece of steel) that resulted in chronic discoloration.  Has been walking a lot at work and going up and down stairs so he is worried.  He reports discoloration in his left big toe that extends over the bases of his 2nd and 3rd toes on his left foot as well ever since the surgery.  Some soreness at the base of his toes on top of foot when he is walking, which started about 6 days ago- but has been on his feet 14 hrs/day since that time due to work.  Looks a little more red than usual and is a little swollen for the past 2 days.  Doesn't hurt to touch, just gets sore with walking.  Took ibuprofen last night, which helped.  Can move toes with minimal to no pain.  No trauma or injury to foot that he can recall- wears steel-toed boots at work.  Has some decreased sensation in his left foot, specifically the left big toe, at baseline since his surgery.  Denies fevers/chills, N/V/D, spreading redness of his foot.    ROS: Per HPI  History  Smoking status  . Former Smoker  . Types: Cigarettes  Smokeless tobacco  . Former Systems developer  . Quit date: 09/03/2012    O:  Filed Vitals:   09/11/12 0902  BP: 175/97  Pulse: 83    Gen: NAD Ext: R foot normal with normal sensation throughout; left foot with moderate swelling and blanching erythema over left great toe to mid-dorsal foot, no TTP, full ROM of ankle and toes; decreased sensation over great toe to mid foot both dorsal and plantar, 2nd toe to base of toe, otherwise normal sensation; no warmth appreciated    A/P: 46 y.o. male p/w swelling and redness of left foot/great toe -See problem list -f/u in 3 days

## 2012-09-11 NOTE — Telephone Encounter (Signed)
This encounter was created in error - please disregard.

## 2012-09-11 NOTE — Patient Instructions (Signed)
It was nice to meet you today.  Your blood pressure is really high!  Please try to see Dr. Jess Barters within the next week!  For your toe, we are drawing a few labs and I'm going to have you go to the hospital to get an Xray.  Since it's early, we will get it back today and be able to make a decision on what we need to do.  If the Xrays and lab work is normal, keep taking ibuprofen or tylenol as needed for pain and swelling.  Keep your foot elevated.  You can use ice a few times per day as well.  Come back on MONDAY so that either Dr. Jess Barters or myself can look at your foot to make sure it still looks ok.  Go to the ER over the weekend if it gets more red and swollen or becomes more painful, or if you start having fevers or chills.

## 2012-09-11 NOTE — Telephone Encounter (Signed)
Called pt. [name] to call back. Please tell pt (see message below) .[name]

## 2012-09-11 NOTE — Telephone Encounter (Signed)
Error

## 2012-09-11 NOTE — Telephone Encounter (Signed)
Please call pt and let him know that his Xray was negative for anything concerning (no fracture, break, or infection in the bone) so we will continue with the original plan.  I would still like him to be seen on Monday to have his foot rechecked.

## 2012-09-11 NOTE — Assessment & Plan Note (Addendum)
Unclear etiology- does not appear to be cellulitis, unlikely to be septic joint or gout but will check CBC and uric acid level.  Will also check Cr as pt advised to use tylenol and ibuprofen PRN so want to check renal function.  Will get xray of foot to r/o fracture, charcot joint in this patient with poorly controlled diabetes and decreased sensation over the affected joint.  If no bony abnormality, will have pt f/u on Monday for recheck of foot (specifically to ensure redness and swelling are not worsening). Rec'ed elevation and ice over the weekend. If there is an abnormality on xray, will refer or admit accordingly. Precepted with Dr. Doreene Nest who also examined the patient and agreed with the plan.

## 2012-09-12 ENCOUNTER — Encounter: Payer: Self-pay | Admitting: Family Medicine

## 2012-09-13 ENCOUNTER — Emergency Department (HOSPITAL_COMMUNITY): Payer: Medicaid Other

## 2012-09-13 ENCOUNTER — Encounter (HOSPITAL_COMMUNITY): Payer: Self-pay | Admitting: Emergency Medicine

## 2012-09-13 ENCOUNTER — Encounter: Payer: Self-pay | Admitting: Family Medicine

## 2012-09-13 ENCOUNTER — Inpatient Hospital Stay (HOSPITAL_COMMUNITY)
Admission: EM | Admit: 2012-09-13 | Discharge: 2012-09-14 | DRG: 603 | Disposition: A | Discharge status: Home / Self Care | Payer: Medicaid Other | Source: Ambulatory Visit | Attending: Family Medicine | Admitting: Family Medicine

## 2012-09-13 DIAGNOSIS — I10 Essential (primary) hypertension: Secondary | ICD-10-CM | POA: Diagnosis present

## 2012-09-13 DIAGNOSIS — R0781 Pleurodynia: Secondary | ICD-10-CM | POA: Insufficient documentation

## 2012-09-13 DIAGNOSIS — Z794 Long term (current) use of insulin: Secondary | ICD-10-CM

## 2012-09-13 DIAGNOSIS — L02619 Cutaneous abscess of unspecified foot: Principal | ICD-10-CM | POA: Diagnosis present

## 2012-09-13 DIAGNOSIS — E119 Type 2 diabetes mellitus without complications: Secondary | ICD-10-CM | POA: Diagnosis present

## 2012-09-13 DIAGNOSIS — Z79899 Other long term (current) drug therapy: Secondary | ICD-10-CM

## 2012-09-13 DIAGNOSIS — L0390 Cellulitis, unspecified: Secondary | ICD-10-CM

## 2012-09-13 LAB — COMPREHENSIVE METABOLIC PANEL
ALT: 14 U/L (ref 0–53)
Alkaline Phosphatase: 92 U/L (ref 39–117)
CO2: 28 mEq/L (ref 19–32)
GFR calc Af Amer: >90 mL/min (ref >90)
GFR calc non Af Amer: >90 mL/min (ref >90)
Glucose, Bld: 289 mg/dL — ABNORMAL HIGH (ref 70–99)
Potassium: 3.4 mEq/L — ABNORMAL LOW (ref 3.5–5.1)
Sodium: 135 mEq/L (ref 135–145)
Total Bilirubin: 0.8 mg/dL (ref 0.3–1.2)

## 2012-09-13 LAB — CBC WITH DIFFERENTIAL/PLATELET
Hemoglobin: 12.8 g/dL — ABNORMAL LOW (ref 13.0–17.0)
Lymphocytes Relative: 12 % (ref 12–46)
Lymphs Abs: 1.5 10*3/uL (ref 0.7–4.0)
MCV: 87.7 fL (ref 78.0–100.0)
Neutrophils Relative %: 75 % (ref 43–77)
Platelets: 142 10*3/uL — ABNORMAL LOW (ref 150–400)
RBC: 3.97 MIL/uL — ABNORMAL LOW (ref 4.22–5.81)
WBC: 13.1 10*3/uL — ABNORMAL HIGH (ref 4.0–10.5)

## 2012-09-13 LAB — LACTIC ACID, PLASMA: Lactic Acid, Venous: 0.9 mmol/L (ref 0.5–2.2)

## 2012-09-13 LAB — SEDIMENTATION RATE: Sed Rate: 65 mm/hr — ABNORMAL HIGH (ref 0–16)

## 2012-09-13 MED ORDER — ASPIRIN 325 MG PO TABS
325.0000 mg | ORAL_TABLET | Freq: Every day | ORAL | Status: DC
  Filled 2012-09-13: qty 1

## 2012-09-13 MED ORDER — SODIUM CHLORIDE 0.9 % IV BOLUS (SEPSIS)
2000.0000 mL | Freq: Once | INTRAVENOUS | Status: AC
  Administered 2012-09-13: 2000 mL via INTRAVENOUS

## 2012-09-13 MED ORDER — ASPIRIN 81 MG PO CHEW
324.0000 mg | CHEWABLE_TABLET | Freq: Once | ORAL | Status: AC
  Administered 2012-09-13: 324 mg via ORAL
  Filled 2012-09-13: qty 4

## 2012-09-13 MED ORDER — MORPHINE SULFATE 4 MG/ML IJ SOLN
4.0000 mg | Freq: Once | INTRAMUSCULAR | Status: AC
  Administered 2012-09-13: 4 mg via INTRAVENOUS
  Filled 2012-09-13: qty 1

## 2012-09-13 MED ORDER — METRONIDAZOLE IN NACL 5-0.79 MG/ML-% IV SOLN
500.0000 mg | Freq: Once | INTRAVENOUS | Status: AC
  Administered 2012-09-14: 500 mg via INTRAVENOUS
  Filled 2012-09-13: qty 100

## 2012-09-13 MED ORDER — VANCOMYCIN HCL IN DEXTROSE 1-5 GM/200ML-% IV SOLN
1000.0000 mg | Freq: Once | INTRAVENOUS | Status: AC
  Administered 2012-09-13: 1000 mg via INTRAVENOUS
  Filled 2012-09-13: qty 200

## 2012-09-13 MED ORDER — DEXTROSE 5 % IV SOLN
1.0000 g | Freq: Once | INTRAVENOUS | Status: AC
  Administered 2012-09-13: 1 g via INTRAVENOUS
  Filled 2012-09-13: qty 10

## 2012-09-13 NOTE — ED Notes (Signed)
C/o wound to R great toe with increased pain and redness since this morning.  Reports chills and pain radiating up R leg.  States he saw PCP last week for L foot infection.

## 2012-09-13 NOTE — ED Notes (Signed)
Left foot tender to touch, rated 8/10 with swelling  And ecchymosis

## 2012-09-13 NOTE — ED Provider Notes (Signed)
History     CSN: [id number]  Arrival date & time 09/13/12  [id number]   First MD Initiated Contact with Patient 09/13/12 2015      Chief Complaint  Patient presents with  . Wound Infection    (Consider location/radiation/quality/duration/timing/severity/associated sxs/prior treatment) HPI Comments: Pt comes in with cc of wound infection. Pt has hx of IDDM. Pt states that he woke up this am with throbbing pain in his right toe. He took his sock off, and noted increased swelling, and redness with some skin hyperpigmentation There is no n/v/f/ - but he is having some chills. Pt also has redness and swelling in his left foot. He has no hx of clots, no hx of endocarditis and no risk factors for the same.   The history is provided by the patient.    Past Medical History  Diagnosis Date  . Hypertension   . Diabetes mellitus     Type 2 DM  x 24 years  . Foreign body in left foot     with  infection    Past Surgical History  Procedure Date  . No past surgeries   . I&d extremity 04/23/2012    Procedure: IRRIGATION AND DEBRIDEMENT EXTREMITY;  Surgeon: Newt Minion, MD;  Location: [facility];  Service: Orthopedics;  Laterality: Left;  Irrigation and Debridement Left Foot    Family History  Problem Relation Age of Onset  . Diabetes type II Mother   . Diabetes type II Sister     History  Substance Use Topics  . Smoking status: Former Smoker    Types: Cigarettes  . Smokeless tobacco: Former Systems developer    Quit date: 09/03/2012  . Alcohol Use: 4.8 oz/week    6 Cans of beer, 2 Shots of liquor per week     Comment: weekends.      Review of Systems  Constitutional: Negative for fever, chills, activity change and appetite change.  HENT: Negative for neck pain.   Eyes: Negative for visual disturbance.  Respiratory: Negative for cough, chest tightness and shortness of breath.   Cardiovascular: Negative for chest pain.  Gastrointestinal: Negative for abdominal pain and abdominal distention.   Genitourinary: Negative for dysuria, enuresis and difficulty urinating.  Musculoskeletal: Negative for arthralgias.  Skin: Positive for color change, rash and wound.  Neurological: Negative for dizziness, light-headedness and headaches.  Psychiatric/Behavioral: Negative for confusion.    Allergies  Latex  Home Medications   Current Outpatient Rx  Name  Route  Sig  Dispense  Refill  . CYCLOBENZAPRINE HCL 10 MG PO TABS   Oral   Take 1 tablet (10 mg total) by mouth 3 (three) times daily as needed for muscle spasms.   30 tablet   0   . HYDROCODONE-ACETAMINOPHEN 5-325 MG PO TABS   Oral   Take 1 tablet by mouth every 6 (six) hours as needed for pain.   30 tablet   0   . IBUPROFEN 800 MG PO TABS   Oral   Take 800 mg by mouth daily as needed. For pain         . INSULIN GLARGINE 100 UNIT/ML [place] SOLN   Subcutaneous   Inject 6 Units into the skin at bedtime.         [name] LISINOPRIL 10 MG PO TABS   Oral   Take 10 mg by mouth every evening.         [name] METFORMIN HCL 500 MG PO TABS   Oral   Take  1 tablet (500 mg total) by mouth 2 (two) times daily with a meal.   60 tablet   3     BP 144/79  Temp 98.7 F (37.1 C) (Oral)  Resp 20  SpO2 98%  Physical Exam  Nursing note and vitals reviewed. Constitutional: He is oriented to person, place, and time. He appears well-developed.  HENT:  Head: Normocephalic and atraumatic.  Eyes: Conjunctivae normal and EOM are normal. Pupils are equal, round, and reactive to light.  Neck: Normal range of motion. Neck supple.  Cardiovascular: Normal rate and regular rhythm.   No murmur heard. Pulmonary/Chest: Effort normal and breath sounds normal.  Abdominal: Soft. Bowel sounds are normal. He exhibits no distension. There is no tenderness. There is no rebound and no guarding.  Neurological: He is alert and oriented to person, place, and time.  Skin: Skin is warm.       Pt has some erythema to the left dorsal foot, slight callor. Right  foot - patient's great toe has is edematous, tender and has necrotic appearing lesions. 2+ DP bilaterally. There is some distal foot erythema as well.    ED Course  Procedures (including critical care time)  Labs Reviewed  GLUCOSE, CAPILLARY - Abnormal; Notable for the following:    Glucose-Capillary 330 (*)     All other components within normal limits  GLUCOSE, CAPILLARY - Abnormal; Notable for the following:    Glucose-Capillary 311 (*)     All other components within normal limits  CBC WITH DIFFERENTIAL  COMPREHENSIVE METABOLIC PANEL  URINALYSIS, ROUTINE W REFLEX MICROSCOPIC  CULTURE, BLOOD (ROUTINE X 2)  CULTURE, BLOOD (ROUTINE X 2)  URINE CULTURE  SEDIMENTATION RATE   No results found.   No diagnosis found.    MDM  Pt comes in with cc of cellulitis. Pt's toe exam is concerning for infection (osteo/cellulitis) vs. Ischemia - possibly embolic. Pt has no risk factors for septic emboli or any emboli- and his heart has no murmurs.  We will start him on broad spectrum AB. We will give IVF. We will get Xrays. We will get appropriate cultures.  Debating on anticoagulation. Will give ASA for sure.  Varney Biles, MD 09/13/12 2303

## 2012-09-13 NOTE — ED Notes (Signed)
Pt states he had surgery on left foot on 04/23/12 and did OK post op until 4 days ago when  He developed redness and swelling to left foot.  Saw [facility] MD that day. Today he awoke with right great toe swollen, red and streaking up left side of right foot.  Right great toe intact with purulent looking drainage beneath.  Denies fever, injury to foot.  Rates pain 8/10 has taken no pain meds.

## 2012-09-13 NOTE — ED Notes (Addendum)
Pt presents with R great toe pain, foot appears red and hot to touch.  Increased pain when walking.  Pt had similar problem with left foot last week and saw PCP regarding it, has a follow up appointment with them tomorrow.  Pt with history of diabetes, NAD at this time.

## 2012-09-13 NOTE — ED Notes (Signed)
CBG checked 311

## 2012-09-14 ENCOUNTER — Inpatient Hospital Stay (HOSPITAL_COMMUNITY): Payer: Medicaid Other

## 2012-09-14 ENCOUNTER — Ambulatory Visit: Payer: Medicaid Other | Admitting: Family Medicine

## 2012-09-14 DIAGNOSIS — I10 Essential (primary) hypertension: Secondary | ICD-10-CM

## 2012-09-14 DIAGNOSIS — L02619 Cutaneous abscess of unspecified foot: Secondary | ICD-10-CM

## 2012-09-14 DIAGNOSIS — L03119 Cellulitis of unspecified part of limb: Secondary | ICD-10-CM

## 2012-09-14 DIAGNOSIS — E119 Type 2 diabetes mellitus without complications: Secondary | ICD-10-CM

## 2012-09-14 LAB — URINALYSIS, ROUTINE W REFLEX MICROSCOPIC
Bilirubin Urine: NEGATIVE
Ketones, ur: NEGATIVE mg/dL
Nitrite: NEGATIVE
Protein, ur: >300 mg/dL — AB
Urobilinogen, UA: 1 mg/dL (ref 0.0–1.0)
pH: 6 (ref 5.0–8.0)

## 2012-09-14 LAB — CBC
Platelets: 152 10*3/uL (ref 150–400)
RDW: 12.3 % (ref 11.5–15.5)
WBC: 11.5 10*3/uL — ABNORMAL HIGH (ref 4.0–10.5)

## 2012-09-14 LAB — BASIC METABOLIC PANEL
Chloride: 102 mEq/L (ref 96–112)
GFR calc Af Amer: >90 mL/min (ref >90)
Potassium: 3.5 mEq/L (ref 3.5–5.1)

## 2012-09-14 LAB — GLUCOSE, CAPILLARY
Glucose-Capillary: 296 mg/dL — ABNORMAL HIGH (ref 70–99)
Glucose-Capillary: 233 mg/dL — ABNORMAL HIGH (ref 70–99)
Glucose-Capillary: 149 mg/dL — ABNORMAL HIGH (ref 70–99)

## 2012-09-14 LAB — URINE MICROSCOPIC-ADD ON

## 2012-09-14 MED ORDER — CLINDAMYCIN HCL 150 MG PO CAPS: 450.0000 mg | ORAL_CAPSULE | Freq: Four times a day (QID) | ORAL | Status: DC

## 2012-09-14 MED ORDER — ACETAMINOPHEN 325 MG PO TABS
650.0000 mg | ORAL_TABLET | Freq: Four times a day (QID) | ORAL | Status: DC | PRN
  Administered 2012-09-14: 650 mg via ORAL
  Filled 2012-09-14: qty 2

## 2012-09-14 MED ORDER — MUPIROCIN 2 % EX OINT: TOPICAL_OINTMENT | Freq: Three times a day (TID) | CUTANEOUS | Status: DC

## 2012-09-14 MED ORDER — PIPERACILLIN-TAZOBACTAM 3.375 G IVPB 30 MIN
3.3750 g | Freq: Once | INTRAVENOUS | Status: AC
  Administered 2012-09-14: 3.375 g via INTRAVENOUS
  Filled 2012-09-14: qty 50

## 2012-09-14 MED ORDER — ONDANSETRON HCL 4 MG PO TABS: 4.0000 mg | ORAL_TABLET | Freq: Four times a day (QID) | ORAL | Status: DC | PRN

## 2012-09-14 MED ORDER — GADOBENATE DIMEGLUMINE 529 MG/ML IV SOLN
20.0000 mL | Freq: Once | INTRAVENOUS | Status: AC
  Administered 2012-09-14: 20 mL via INTRAVENOUS

## 2012-09-14 MED ORDER — INSULIN ASPART 100 UNIT/ML ~~LOC~~ SOLN
5.0000 [IU] | Freq: Once | SUBCUTANEOUS | Status: AC
  Administered 2012-09-14: 5 [IU] via SUBCUTANEOUS

## 2012-09-14 MED ORDER — HEPARIN SODIUM (PORCINE) 5000 UNIT/ML IJ SOLN
5000.0000 [IU] | Freq: Three times a day (TID) | INTRAMUSCULAR | Status: DC
  Administered 2012-09-14 (×2): 5000 [IU] via SUBCUTANEOUS
  Filled 2012-09-14 (×4): qty 1

## 2012-09-14 MED ORDER — SODIUM CHLORIDE 0.9 % IV SOLN
INTRAVENOUS | Status: DC
  Administered 2012-09-14 (×2): via INTRAVENOUS

## 2012-09-14 MED ORDER — PIPERACILLIN-TAZOBACTAM 3.375 G IVPB
3.3750 g | Freq: Three times a day (TID) | INTRAVENOUS | Status: DC
  Administered 2012-09-14: 3.375 g via INTRAVENOUS
  Filled 2012-09-14 (×4): qty 50

## 2012-09-14 MED ORDER — OXYCODONE-ACETAMINOPHEN 5-325 MG PO TABS: 1.0000 | ORAL_TABLET | ORAL | Status: DC | PRN

## 2012-09-14 MED ORDER — LISINOPRIL 10 MG PO TABS: 10.0000 mg | ORAL_TABLET | Freq: Every evening | ORAL | Status: DC

## 2012-09-14 MED ORDER — VANCOMYCIN HCL IN DEXTROSE 1-5 GM/200ML-% IV SOLN
1000.0000 mg | Freq: Three times a day (TID) | INTRAVENOUS | Status: DC
  Administered 2012-09-14 (×2): 1000 mg via INTRAVENOUS
  Filled 2012-09-14 (×4): qty 200

## 2012-09-14 MED ORDER — MORPHINE SULFATE 2 MG/ML IJ SOLN
2.0000 mg | INTRAMUSCULAR | Status: DC | PRN
  Administered 2012-09-14 (×3): 2 mg via INTRAVENOUS
  Filled 2012-09-14 (×3): qty 1

## 2012-09-14 MED ORDER — POLYETHYLENE GLYCOL 3350 17 G PO PACK: 17.0000 g | PACK | Freq: Every day | ORAL | Status: DC | PRN

## 2012-09-14 MED ORDER — ACETAMINOPHEN 650 MG RE SUPP: 650.0000 mg | Freq: Four times a day (QID) | RECTAL | Status: DC | PRN

## 2012-09-14 MED ORDER — INSULIN ASPART 100 UNIT/ML ~~LOC~~ SOLN: 0.0000 [IU] | Freq: Three times a day (TID) | SUBCUTANEOUS | Status: DC

## 2012-09-14 MED ORDER — INSULIN ASPART 100 UNIT/ML ~~LOC~~ SOLN
0.0000 [IU] | SUBCUTANEOUS | Status: DC
  Administered 2012-09-14: 3 [IU] via SUBCUTANEOUS

## 2012-09-14 MED ORDER — LISINOPRIL 10 MG PO TABS
10.0000 mg | ORAL_TABLET | Freq: Every evening | ORAL | Status: DC
  Administered 2012-09-14: 10 mg via ORAL
  Filled 2012-09-14 (×2): qty 1

## 2012-09-14 MED ORDER — INSULIN ASPART 100 UNIT/ML ~~LOC~~ SOLN
10.0000 [IU] | Freq: Once | SUBCUTANEOUS | Status: AC
  Administered 2012-09-14: 10 [IU] via SUBCUTANEOUS

## 2012-09-14 MED ORDER — ONDANSETRON HCL 4 MG/2ML IJ SOLN: 4.0000 mg | Freq: Four times a day (QID) | INTRAMUSCULAR | Status: DC | PRN

## 2012-09-14 NOTE — Progress Notes (Signed)
Patient ID: [name], male   DOB: 1966/04/02, 46 y.o.   MRN: [id number] Patient off floor getting MRI scan, I'll evaluate later today

## 2012-09-14 NOTE — Progress Notes (Signed)
Brief Interval Progress Note:  S: Pt reports that R great toe spontaneously drained purulent fluid this morning before his MRI  O: BP 141/76  Pulse 81  Temp 98.3 F (36.8 C) (Oral)  Resp 19  Ht 5\' 11"  (1.803 m)  Wt 213 lb 1.6 oz (96.662 kg)  BMI 29.72 kg/m2  SpO2 98% Gen: NAD Heart: RRR Lungs: CTAB via anterior auscultation Abd: soft, nontender Ext: see photo below; left foot with increased erythema, right with spontaneously draining great toe, tender to palpation     MRI negative for osteo or septic arthritis, no fluid collection seen  A/P: -Awaiting ortho input on whether surgical intervention is necessary -Continue vanc/zosyn -NPO at this time

## 2012-09-14 NOTE — Progress Notes (Signed)
Spoke to Dr. Sharol Given concerning pt.  After review of MRI and chart we feel that pt is stable for discharge for outpt management of his foot infection. No surgical intervention indicated at this time.   Linna Darner, MD Family Medicine PGY-2 09/14/2012, 3:32 PM

## 2012-09-14 NOTE — ED Notes (Signed)
Pt transported to [address]N05 via stretcher.  Report given to Ria Comment, [place]
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
## 2012-09-14 NOTE — Progress Notes (Signed)
ANTIBIOTIC CONSULT NOTE - INITIAL  Pharmacy Consult for Vancocin and Zosyn Indication: cellulitis  Allergies  Allergen Reactions  . Latex Rash    Pt states he is allergic to latex condoms    Patient Measurements: Height: 5\' 11"  (180.3 cm) Weight: 213 lb 1.6 oz (96.662 kg) IBW/kg (Calculated) : 75.3   Vital Signs: Temp: 98.3 F (36.8 C) (12/09 0152) Temp src: Oral (12/09 0152) BP: 141/76 mmHg (12/09 0152) Pulse Rate: 81  (12/09 0152)  Labs:  [facility] 09/13/12 2221 09/11/12 0946  WBC 13.1* 8.5  HGB 12.8* 14.4  PLT 142* 155  LABCREA -- --  CREATININE 0.65 0.75   Estimated Creatinine Clearance: 139.8 ml/min (by C-G formula based on Cr of 0.65).  Microbiology: No results found for this or any previous visit (from the past 720 hour(s)).  Medical History: Past Medical History  Diagnosis Date  . Hypertension   . Diabetes mellitus     Type 2 DM  x 24 years  . Foreign body in left foot     with  infection    Medications:  Prescriptions prior to admission  Medication Sig Dispense Refill  . cyclobenzaprine (FLEXERIL) 10 MG tablet Take 1 tablet (10 mg total) by mouth 3 (three) times daily as needed for muscle spasms.  30 tablet  0  . HYDROcodone-acetaminophen (NORCO) 5-325 MG per tablet Take 1 tablet by mouth every 6 (six) hours as needed for pain.  30 tablet  0  . ibuprofen (ADVIL,MOTRIN) 800 MG tablet Take 800 mg by mouth daily as needed. For pain      . insulin glargine (LANTUS) 100 UNIT/ML injection Inject 6 Units into the skin at bedtime.      [name] lisinopril (PRINIVIL,ZESTRIL) 10 MG tablet Take 10 mg by mouth every evening.      . metFORMIN (GLUCOPHAGE) 500 MG tablet Take 1 tablet (500 mg total) by mouth 2 (two) times daily with a meal.  60 tablet  3   Scheduled:    . [COMPLETED] aspirin  324 mg Oral Once  . [COMPLETED] cefTRIAXone (ROCEPHIN)  IV  1 g Intravenous Once  . heparin  5,000 Units Subcutaneous Q8H  . insulin aspart  0-20 Units Subcutaneous TID WC  .  lisinopril  10 mg Oral QPM  . [COMPLETED] metronidazole  500 mg Intravenous Once  . [COMPLETED]  morphine injection  4 mg Intravenous Once  . [COMPLETED]  morphine injection  4 mg Intravenous Once  . piperacillin-tazobactam  3.375 g Intravenous Once  . piperacillin-tazobactam (ZOSYN)  IV  3.375 g Intravenous Q8H  . [COMPLETED] sodium chloride  2,000 mL Intravenous Once  . [COMPLETED] vancomycin  1,000 mg Intravenous Once  . vancomycin  1,000 mg Intravenous Q8H  . [DISCONTINUED] aspirin  325 mg Oral Daily  . [DISCONTINUED] lisinopril  10 mg Oral QPM    Assessment: 46yo male c/o throbbing pain of right toe, Xray shows no evidence of osteo, to begin IV ABX for cellulitis.  Goal of Therapy:  Vancomycin trough level 10-15 mcg/ml  Plan:  Rec'd vanc 1g in ED; will continue with vancomycin 1000mg  IV Q8H as well as Zosyn 3.375g IV Q8H and monitor CBC, Cx, levels prn.  [place] Bussing PharmD BCPS 09/14/2012,2:00 AM

## 2012-09-14 NOTE — Progress Notes (Addendum)
Inpatient Diabetes Program Recommendations  AACE/ADA: New Consensus Statement on Inpatient Glycemic Control (2013)  Target Ranges:  Prepandial:   less than 140 mg/dL      Peak postprandial:   less than 180 mg/dL (1-2 hours)      Critically ill patients:  140 - 180 mg/dL   Results for [name], [name] (MRN [id number]) as of 09/14/2012 09:56  Ref. Range 09/11/2012 09:11 09/11/2012 09:46 09/13/2012 22:21 09/14/2012 05:20  Hemoglobin A1C Latest Range: <5.7 % 8.8     Glucose Latest Range: 70-99 mg/dL  294 (H) 289 (H) 255 (H)   Results for [name], [name] (MRN [id number]) as of 09/14/2012 09:56  Ref. Range 09/13/2012 19:33 09/13/2012 19:48 09/14/2012 01:55 09/14/2012 05:33 09/14/2012 07:57  Glucose-Capillary Latest Range: 70-99 mg/dL 330 (H) 311 (H) 296 (H) 254 (H) 233 (H)    Inpatient Diabetes Program Recommendations Insulin - Basal: Please start patient on basal insulin.  Since patient takes Lantus at home, recommend Lantus 10 units QHS.  Note: Patient is noted to be a type 1 diabetic in the H&P; therefore, patient will require basal insulin.  Recommend starting Lantus 10 units QHS in order to prevent further glucose elevation and DKA.  Patient is currently NPO and is receiving Novolog correction resistant scale Q4H.  Blood glucose over the past 15 hours ranging from 233-330 mg/dl.  Will continue to follow.  Thanks, Barnie Alderman, RN, BSN, CCRN Diabetes Coordinator Inpatient Diabetes Program [phone]   09/14/2012 @ 15:00 - Talked with the patient regarding his home regimen for glycemic control.  According to the patient, he takes Metformin 500mg  BID and Lantus 6 units QHS at home.  Patient states that he was placed on Lantus in July of this year because his A1C was 14% with Metformin only.  Current A1C is 8.8% which is improved from July A1C.  Patient states that he tries to eat correctly and follows a diabetic diet at home.  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  He states that he  has to wear steel toed boots everyday which does not help cellulitis in feet.  Patient will follow up with outpatient [facility].  Thanks, Barnie Alderman, RN, BSN, [place] Diabetes Coordinator Inpatient Diabetes Program [phone]

## 2012-09-14 NOTE — Progress Notes (Signed)
Due to patient being NPO status, clarified with Dr. Ardelia Mems about CBG schedule and SSI schedule.  Dr. Ardelia Mems wishes patient to only have sliding scale insulin to be used during meal times but would like CBG to be checked every 4 hours.  Orders changed as stated.  No other concerns at this time.  Patient resting comfortably.  [name] 5:18 AM 09/14/2012

## 2012-09-14 NOTE — Discharge Summary (Signed)
[facility] Discharge Summary  Patient name: [name] Medical record number: [id number] Date of birth: 07/03/1966 Age: 46 y.o. Gender: male Date of Admission: 09/13/2012  Date of Discharge: 09/14/12 Admitting Physician: Willeen Niece, MD  Primary Care Provider: Vickie Epley, MD  Indication for Hospitalization: right foot cellulitis  Discharge Diagnoses:  1. Right foot cellulitis/abscess 2. Left foot erythema 3. Insulin dependent diabetes, poorly controlled 4. Hypertension  Brief Hospital Course:  [name] is a 46 y.o. male who presented to the ED with one day of acute-onset pain, erythema and swelling in his right great toe, with spread of the pain and erythema further up the foot on the day of admission. Plain films of both feet were negative for any signs of osteomyelitis. He was admitted to the floor and given IV Vancomycin and Zosyn to cover for MRSA and pseudomonal cellulitis. Blood cultures were drawn. An MRI of the R foot was done the morning following admission, which did not show any sign of osteomyelitis, septic arthritis, or underlying abscess, however the toe had already begun to spontaneously drain purulent fluid. After consulting with orthopedics, it was decided that there was no need for surgical intervention and that patient could be managed as an outpatient with oral antibiotics and pain control. He was discharged on clindamycin and will have close follow-up with the [facility].  Of note, patient has a history of surgery on his LEFT foot for a retained foreign body in July 2013, and has some residual color change from that event/procedure. While in the hospital his left foot was also somewhat red, but nontender to palpation and not as concerning for cellulitis. See progress note by Dr. Chrisandra Netters on 09/14/12 for a photo of feet on the day of discharge.  Consultations: Dr. Sharol Given of Orthopedic Surgery  Procedures:  none  Significant Labs and Imaging:  Plain film R foot:  No acute osseous abnormality. No plain film evidence of osteomyelitis.   Plain film L foot:  No acute osseous abnormality. No plain film evidence of osteomyelitis.  MRI R foot: 1. Forefoot cellulitis with ill-defined area of decreased enhancement medially along the nail bed of the great toe. No drainable abscess identified.  2. No evidence of osteomyelitis or septic arthritis.  CBC:    Component Value Date/Time   WBC 11.5* 09/14/2012 0520   HGB 12.7* 09/14/2012 0520   HCT 34.9* 09/14/2012 0520   PLT 152 09/14/2012 0520   MCV 87.5 09/14/2012 0520   NEUTROABS 9.8* 09/13/2012 2221   LYMPHSABS 1.5 09/13/2012 2221   MONOABS 1.5* 09/13/2012 2221   EOSABS 0.3 09/13/2012 2221   BASOSABS 0.0 09/13/2012 2221   Discharge Medications:    Medication List     As of 09/15/2012  2:43 AM    STOP taking these medications         HYDROcodone-acetaminophen 5-325 MG per tablet   Commonly known as: NORCO/VICODIN      ibuprofen 800 MG tablet   Commonly known as: ADVIL,MOTRIN      TAKE these medications         clindamycin 150 MG capsule   Commonly known as: CLEOCIN   Take 3 capsules (450 mg total) by mouth 4 (four) times daily.      cyclobenzaprine 10 MG tablet   Commonly known as: FLEXERIL   Take 1 tablet (10 mg total) by mouth 3 (three) times daily as needed for muscle spasms.      insulin glargine 100  UNIT/ML injection   Commonly known as: LANTUS   Inject 6 Units into the skin at bedtime.      lisinopril 10 MG tablet   Commonly known as: PRINIVIL,ZESTRIL   Take 10 mg by mouth every evening.      metFORMIN 500 MG tablet   Commonly known as: GLUCOPHAGE   Take 1 tablet (500 mg total) by mouth 2 (two) times daily with a meal.      mupirocin ointment 2 %   Commonly known as: BACTROBAN   Apply topically 3 (three) times daily. To affected toe      oxyCODONE-acetaminophen 5-325 MG per tablet   Commonly known as: PERCOCET/ROXICET    Take 1 tablet by mouth every 4 (four) hours as needed for pain.        Issues for Follow Up:  -Pt will need f/u with [facility] for monitoring of resolution of right foot cellulitis/abscess -Will need continued f/u for management of chronic diabetes  Outstanding Results: blood cultures x2      Follow-up Information    Follow up with Vickie Epley, MD.   Contact information:   [place] [address] [phone]          Discharge Condition: stable  Discharge Instructions: Please refer to Patient Instructions section of EMR for full details.  Patient was counseled important signs and symptoms that should prompt return to medical care, changes in medications, dietary instructions, activity restrictions, and follow up appointments.    Chrisandra Netters, MD Family Medicine PGY-1

## 2012-09-14 NOTE — Progress Notes (Signed)
Patient discharged to home.  Discharge instructions completed including follow up care, medications, and wound care.  Verbalizes understanding with no further questions.  Vital signs stable.  Discharged per wheelchair with wife.

## 2012-09-15 LAB — URINE CULTURE: Colony Count: NO GROWTH

## 2012-09-15 NOTE — Discharge Summary (Signed)
I have reviewed this discharge summary and agree.    

## 2012-09-16 ENCOUNTER — Ambulatory Visit (INDEPENDENT_AMBULATORY_CARE_PROVIDER_SITE_OTHER): Payer: Medicaid Other | Admitting: Family Medicine

## 2012-09-16 ENCOUNTER — Inpatient Hospital Stay (HOSPITAL_COMMUNITY)
Admission: AD | Admit: 2012-09-16 | Discharge: 2012-09-21 | DRG: 574 | Disposition: A | Discharge status: Home / Self Care | Payer: Medicaid Other | Source: Ambulatory Visit | Attending: Family Medicine | Admitting: Family Medicine

## 2012-09-16 ENCOUNTER — Encounter: Payer: Self-pay | Admitting: Family Medicine

## 2012-09-16 ENCOUNTER — Encounter (HOSPITAL_COMMUNITY): Payer: Self-pay

## 2012-09-16 VITALS — BP 101/72 | HR 102 | Temp 98.8°F | Wt 206.0 lb

## 2012-09-16 DIAGNOSIS — L03119 Cellulitis of unspecified part of limb: Secondary | ICD-10-CM

## 2012-09-16 DIAGNOSIS — E1149 Type 2 diabetes mellitus with other diabetic neurological complication: Secondary | ICD-10-CM | POA: Diagnosis present

## 2012-09-16 DIAGNOSIS — I10 Essential (primary) hypertension: Secondary | ICD-10-CM

## 2012-09-16 DIAGNOSIS — N179 Acute kidney failure, unspecified: Secondary | ICD-10-CM | POA: Diagnosis not present

## 2012-09-16 DIAGNOSIS — E1142 Type 2 diabetes mellitus with diabetic polyneuropathy: Secondary | ICD-10-CM | POA: Diagnosis present

## 2012-09-16 DIAGNOSIS — L03039 Cellulitis of unspecified toe: Secondary | ICD-10-CM | POA: Diagnosis present

## 2012-09-16 DIAGNOSIS — L0390 Cellulitis, unspecified: Secondary | ICD-10-CM

## 2012-09-16 DIAGNOSIS — E118 Type 2 diabetes mellitus with unspecified complications: Secondary | ICD-10-CM

## 2012-09-16 DIAGNOSIS — Z794 Long term (current) use of insulin: Secondary | ICD-10-CM

## 2012-09-16 DIAGNOSIS — L02619 Cutaneous abscess of unspecified foot: Secondary | ICD-10-CM

## 2012-09-16 DIAGNOSIS — L03115 Cellulitis of right lower limb: Secondary | ICD-10-CM

## 2012-09-16 DIAGNOSIS — L0291 Cutaneous abscess, unspecified: Secondary | ICD-10-CM

## 2012-09-16 DIAGNOSIS — Z87891 Personal history of nicotine dependence: Secondary | ICD-10-CM

## 2012-09-16 LAB — COMPREHENSIVE METABOLIC PANEL
AST: 16 U/L (ref 0–37)
Albumin: 2.8 g/dL — ABNORMAL LOW (ref 3.5–5.2)
BUN: 14 mg/dL (ref 6–23)
Chloride: 98 mEq/L (ref 96–112)
Creatinine, Ser: 0.95 mg/dL (ref 0.50–1.35)
Total Bilirubin: 0.6 mg/dL (ref 0.3–1.2)
Total Protein: 7 g/dL (ref 6.0–8.3)

## 2012-09-16 LAB — CBC
MCHC: 36.4 g/dL — ABNORMAL HIGH (ref 30.0–36.0)
MCV: 88.3 fL (ref 78.0–100.0)
Platelets: 222 10*3/uL (ref 150–400)
RDW: 12.1 % (ref 11.5–15.5)
WBC: 11.6 10*3/uL — ABNORMAL HIGH (ref 4.0–10.5)

## 2012-09-16 MED ORDER — VANCOMYCIN HCL IN DEXTROSE 1-5 GM/200ML-% IV SOLN
1000.0000 mg | Freq: Three times a day (TID) | INTRAVENOUS | Status: DC
  Administered 2012-09-16 – 2012-09-21 (×14): 1000 mg via INTRAVENOUS
  Filled 2012-09-16 (×17): qty 200

## 2012-09-16 MED ORDER — ENOXAPARIN SODIUM 40 MG/0.4ML ~~LOC~~ SOLN
40.0000 mg | SUBCUTANEOUS | Status: DC
  Administered 2012-09-16: 40 mg via SUBCUTANEOUS
  Filled 2012-09-16 (×3): qty 0.4

## 2012-09-16 MED ORDER — SODIUM CHLORIDE 0.9 % IV SOLN
INTRAVENOUS | Status: DC
  Administered 2012-09-16 – 2012-09-17 (×3): via INTRAVENOUS

## 2012-09-16 MED ORDER — MORPHINE SULFATE 2 MG/ML IJ SOLN
1.0000 mg | INTRAMUSCULAR | Status: DC | PRN
  Administered 2012-09-16 – 2012-09-17 (×2): 1 mg via INTRAVENOUS
  Filled 2012-09-16 (×2): qty 1

## 2012-09-16 MED ORDER — ONDANSETRON HCL 4 MG PO TABS: 4.0000 mg | ORAL_TABLET | Freq: Four times a day (QID) | ORAL | Status: DC | PRN

## 2012-09-16 MED ORDER — ACETAMINOPHEN 325 MG PO TABS
650.0000 mg | ORAL_TABLET | Freq: Four times a day (QID) | ORAL | Status: DC | PRN
  Administered 2012-09-17 – 2012-09-18 (×4): 650 mg via ORAL
  Filled 2012-09-16 (×4): qty 2

## 2012-09-16 MED ORDER — INSULIN ASPART 100 UNIT/ML ~~LOC~~ SOLN
0.0000 [IU] | SUBCUTANEOUS | Status: DC
  Administered 2012-09-17: 5 [IU] via SUBCUTANEOUS
  Administered 2012-09-17: 8 [IU] via SUBCUTANEOUS
  Administered 2012-09-17: 3 [IU] via SUBCUTANEOUS
  Administered 2012-09-17 (×3): 2 [IU] via SUBCUTANEOUS
  Administered 2012-09-18 (×2): 3 [IU] via SUBCUTANEOUS
  Administered 2012-09-18: 8 [IU] via SUBCUTANEOUS
  Administered 2012-09-18 – 2012-09-19 (×4): 2 [IU] via SUBCUTANEOUS
  Administered 2012-09-19: 3 [IU] via SUBCUTANEOUS

## 2012-09-16 MED ORDER — INSULIN GLARGINE 100 UNIT/ML ~~LOC~~ SOLN
5.0000 [IU] | Freq: Every day | SUBCUTANEOUS | Status: DC
  Administered 2012-09-16 – 2012-09-20 (×5): 5 [IU] via SUBCUTANEOUS

## 2012-09-16 MED ORDER — ACETAMINOPHEN 650 MG RE SUPP: 650.0000 mg | Freq: Four times a day (QID) | RECTAL | Status: DC | PRN

## 2012-09-16 MED ORDER — SENNA 8.6 MG PO TABS
1.0000 | ORAL_TABLET | Freq: Two times a day (BID) | ORAL | Status: DC
  Administered 2012-09-17 – 2012-09-20 (×6): 8.6 mg via ORAL
  Filled 2012-09-16 (×13): qty 1

## 2012-09-16 MED ORDER — PIPERACILLIN-TAZOBACTAM 3.375 G IVPB
3.3750 g | Freq: Three times a day (TID) | INTRAVENOUS | Status: DC
  Administered 2012-09-16 – 2012-09-21 (×15): 3.375 g via INTRAVENOUS
  Filled 2012-09-16 (×17): qty 50

## 2012-09-16 MED ORDER — INSULIN ASPART 100 UNIT/ML ~~LOC~~ SOLN
0.0000 [IU] | Freq: Three times a day (TID) | SUBCUTANEOUS | Status: DC
  Administered 2012-09-16: 5 [IU] via SUBCUTANEOUS

## 2012-09-16 MED ORDER — ONDANSETRON HCL 4 MG/2ML IJ SOLN
4.0000 mg | Freq: Four times a day (QID) | INTRAMUSCULAR | Status: DC | PRN
  Administered 2012-09-19: 4 mg via INTRAVENOUS
  Filled 2012-09-16 (×2): qty 2

## 2012-09-16 NOTE — H&P (Signed)
I examined this patient and discussed the care plan with Dr Loraine Maple and the [facility] team and agree with assessment and plan as documented in the admission note above.

## 2012-09-16 NOTE — Progress Notes (Signed)
ANTIBIOTIC CONSULT NOTE - INITIAL  Pharmacy Consult for Vancomycin / Zosyn Indication: cellulitis (failed outpatient clindamycin)  Allergies  Allergen Reactions  . Latex Rash    Pt states he is allergic to latex condoms    Patient Measurements: Height: 5\' 11"  (180.3 cm) Weight: 206 lb (93.441 kg) IBW/kg (Calculated) : 75.3    Labs:  Basename 09/14/12 0520 09/13/12 2221  WBC 11.5* 13.1*  HGB 12.7* 12.8*  PLT 152 142*  LABCREA -- --  CREATININE 0.57 0.65   Estimated Creatinine Clearance: 137.5 ml/min (by C-G formula based on Cr of 0.57). No results found for this basename: VANCOTROUGH:2,VANCOPEAK:2,VANCORANDOM:2,GENTTROUGH:2,GENTPEAK:2,GENTRANDOM:2,TOBRATROUGH:2,TOBRAPEAK:2,TOBRARND:2,AMIKACINPEAK:2,AMIKACINTROU:2,AMIKACIN:2, in the last 72 hours   Microbiology: Recent Results (from the past 720 hour(s))  CULTURE, BLOOD (ROUTINE X 2)     Status: Normal (Preliminary result)   Collection Time   09/13/12  9:20 PM      Component Value Range Status Comment   Specimen Description BLOOD RIGHT ARM   Final    Special Requests BOTTLES DRAWN AEROBIC AND ANAEROBIC 10CC EACH   Final    Culture  Setup Time 09/14/2012 09:25   Final    Culture     Final    Value:        BLOOD CULTURE RECEIVED NO GROWTH TO DATE CULTURE WILL BE HELD FOR 5 DAYS BEFORE ISSUING A FINAL NEGATIVE REPORT   Report Status PENDING   Incomplete   CULTURE, BLOOD (ROUTINE X 2)     Status: Normal (Preliminary result)   Collection Time   09/13/12  9:30 PM      Component Value Range Status Comment   Specimen Description BLOOD RIGHT HAND   Final    Special Requests BOTTLES DRAWN AEROBIC AND ANAEROBIC 10CC EACH   Final    Culture  Setup Time 09/14/2012 09:25   Final    Culture     Final    Value:        BLOOD CULTURE RECEIVED NO GROWTH TO DATE CULTURE WILL BE HELD FOR 5 DAYS BEFORE ISSUING A FINAL NEGATIVE REPORT   Report Status PENDING   Incomplete   URINE CULTURE     Status: Normal   Collection Time   09/14/12 12:03  AM      Component Value Range Status Comment   Specimen Description URINE, RANDOM   Final    Special Requests NONE   Final    Culture  Setup Time 09/14/2012 09:26   Final    Colony Count NO GROWTH   Final    Culture NO GROWTH   Final    Report Status 09/15/2012 FINAL   Final     Medical History: Past Medical History  Diagnosis Date  . Hypertension   . Diabetes mellitus     Type 2 DM  x 24 years  . Foreign body in left foot     with  infection   Assessment: Patient was admitted for R foot cellulitis 12/8-12/9. Was seen by ortho who did not feel surgical intervention was necessary. Since d/c home, patient reports that he has been taking his antibiotics without problems.  Since d/c home from hospital, LEFT foot has gotten a "boil" and started draining last night. He woke up this morning with drenched in sweats; no true fevers or chills but feels badly overall.  Sugars are elevated.  Readmitted for IV antibiotics for cellulitis of both feet.  Renal function is stable.  Goal of Therapy:  Vancomycin trough level 10-15 mcg/ml Appropriate Zosyn dosing  Plan:  1) Zosyn 3.375 grams iv Q 8 hours - 4 hr infusion 2) Vancomycin 1 Gram iv Q 8 hours (same dose as last admit) 3) Follow up progress, cultures, renal function  Thank you. Anette Guarneri, PharmD [phone]  09/16/2012,5:59 PM

## 2012-09-16 NOTE — Progress Notes (Signed)
S: Pt comes in today for HFU.  Patient was admitted for R foot cellulitis 12/8-12/9.  Was seen by ortho who did not feel surgical intervention was necessary.  Since d/c home, patient reports that he has been taking his antibiotics without problems and that pain is not well controlled with the pain medicines.  Pain is throbbing- L foot is much worse than R foot.  Sunday PM prior to admit, R great toe started draining.  Feels like R toe (redness and swelling) looks a little better.  Since d/c home from hospital, LEFT foot has gotten a "boil" and started draining last night. Woke up this morning with drenched in sweats; no true fevers or chills but feels badly overall.  CBGs have been in the 200's at home.  Has stayed off of his feet since discharge.  Last ate at 330pm.   ROS: Per HPI  History  Smoking status  . Former Smoker  . Types: Cigarettes  Smokeless tobacco  . Former Systems developer  . Quit date: 09/03/2012    O:  Filed Vitals:   09/16/12 1626  BP: 101/72  Pulse: 102  Temp: 98.8 F (37.1 C)    Gen: NAD HEENT: poor dentition, MMM, no pharyngeal erythema or exudate, no cervical LAD CV: regular, mild tachycardia, no murmur Pulm: CTA bilat, no wheezes or crackles Abd: soft, NT Ext: L foot: still with hyperpigmentation  and surrounding mild erythema, worse than on admit to hospital; + fluctuant area between 1st an 2nd toes on dosrum on foot with serosanguinous and purulent drainage; R foot: open blister on R great toe with some mild surrounding erythema without warmth or edema    A/P: 46 y.o. male p/w HFU for R foot cellulitis  -See problem list -Admit to hospital

## 2012-09-16 NOTE — H&P (Signed)
[name] is an 46 y.o. male.   Chief Complaint: L foot wound HPI: 46yo male direct admit from clinic for LEFT foot abscess.  Patient was admitted for RIGHT foot cellulitis 12/8-12/9 after being seen in clinic on Friday 12/6 for LEFT foot redness with negative work up (normal WBC, UA, BMET and Xray).  On Sunday (12/8) presented to the ED with RIGHT foot redness and abscess. Sunday AM prior to admit, R great toe started draining.  Had an MRI done which did not show osteo or bone involvement and was seen by ortho who did not feel surgical intervention was necessary. Patient initially started on IV vanc/Zosyn for RIGHT foot abscess and transitioned to po clindamycin before d/c home on Monday 12/9.  Since d/c home, patient reports that he has been taking his antibiotics QID without problems but that pain is not well controlled with the pain medicines. LEFT foot is significantly more sore than right foot.  Pain is throbbing. Feels like R toe (redness and swelling) looks a little better. Since d/c home from hospital, LEFT foot has gotten a "boil" and started draining last night. Woke up this morning drenched in sweats; no true fevers or chills but feels badly overall. CBGs have been in the 200's at home. Has stayed off of his feet since discharge. Is having some dizziness, but no CP or SOB.  No N/V/D.     Last ate at 330pm.    Past Medical History  Diagnosis Date  . Hypertension   . Diabetes mellitus     Type 2 DM  x 24 years  . Foreign body in left foot     with  infection    Past Surgical History  Procedure Date  . No past surgeries   . I&d extremity 04/23/2012    Procedure: IRRIGATION AND DEBRIDEMENT EXTREMITY;  Surgeon: Newt Minion, MD;  Location: [facility];  Service: Orthopedics;  Laterality: Left;  Irrigation and Debridement Left Foot    Family History  Problem Relation Age of Onset  . Diabetes type II Mother   . Diabetes type II Sister    Social History:  reports that he has quit  smoking. His smoking use included Cigarettes. He quit smokeless tobacco use about 1 weeks ago. He reports that he drinks about 4.8 ounces of alcohol per week. He reports that he does not use illicit drugs.  Allergies:  Allergies  Allergen Reactions  . Latex Rash    Pt states he is allergic to latex condoms    Medications Prior to Admission  Medication Sig Dispense Refill  . clindamycin (CLEOCIN) 150 MG capsule Take 3 capsules (450 mg total) by mouth 4 (four) times daily.  108 capsule  0  . cyclobenzaprine (FLEXERIL) 10 MG tablet Take 1 tablet (10 mg total) by mouth 3 (three) times daily as needed for muscle spasms.  30 tablet  0  . insulin glargine (LANTUS) 100 UNIT/ML injection Inject 6 Units into the skin at bedtime.      [name] lisinopril (PRINIVIL,ZESTRIL) 10 MG tablet Take 10 mg by mouth every evening.      . metFORMIN (GLUCOPHAGE) 500 MG tablet Take 1 tablet (500 mg total) by mouth 2 (two) times daily with a meal.  60 tablet  3  . mupirocin ointment (BACTROBAN) 2 % Apply topically 3 (three) times daily. To affected toe  22 g  0  . oxyCODONE-acetaminophen (ROXICET) 5-325 MG per tablet Take 1 tablet by mouth every 4 (four) hours  as needed for pain.  20 tablet  0    No results found for this or any previous visit (from the past 48 hour(s)). No results found.  Review of Systems  Constitutional: Positive for fever and malaise/fatigue. Negative for chills.  Respiratory: Negative for cough and shortness of breath.   Cardiovascular: Negative for chest pain.  Gastrointestinal: Negative for nausea, vomiting and diarrhea.  Musculoskeletal: Negative for falls.  Skin: Positive for rash.  Neurological: Positive for dizziness. Negative for headaches.    There were no vitals taken for this visit. Physical Exam  BP:  101/72   Pulse:  102   Temp:  98.8 F (37.1 C)    Gen: NAD but uncomfortable  HEENT: poor dentition, MMM, no pharyngeal erythema or exudate, no cervical LAD  CV: regular, mild  tachycardia, no murmur  Pulm: CTA bilat, no wheezes or crackles  Abd: soft, NT  Ext: L foot: still with hyperpigmentation and surrounding erythema, worse than on admit to hospital; + fluctuant area between 1st and 2nd toes on dosrum of foot with serosanguinous and purulent drainage, erythema and warmth have spread proximal to line drawn on 12/9 and significantly worse than clinic visit 12/6; R foot: open blister on R great toe with some mild surrounding erythema without warmth or edema, small amount of serous drainage     Assessment/Plan 46 yo M with DM recently admitted 12/8-12/9 for RIGHT foot cellulitis and abscess now with LEFT foot cellulitis and abscess  1. LEFT foot/toe cellulitis and abscess; likely meeting Sepsis criteria: -Admit to inpatient; will meet sepsis criteria if still has leukocytosis as he is tachycardic; pt is relatively hypotensive with a BP of 101/72 given his normal systolic values of [id number] -IV Vanc/Zosyn, has failed outpatient clindamycin that was being given for right foot/toe infection -MRI left foot to r/o osteo; will consult ortho if bone involvement -Will attempt to get deep culture at bedside from abscess; if pt unable to tolerate, would strongly consider surgical collection of abscess for culture with sensitivities; will make pt NPO for now -IVF  -pain control with IV morphine   2. Hypertension: currently with relatively hypotension -Hold home lisinopril at this time -Restart once BP increases  3. DM: poorly controlled based on A1c of 8.8 on 12/6 -Hold home metformin -Lantus 5U + SSI  4. FEN/GI: NPO for now in case there is a need for surgical intervention; NS @ 125cc/hr while tachycardia and relative hypotension persist  5. Ppx: SQ lovenox  5. Dispo: admit to med-surg, d/c pending clinical improvement; may need to consider broadening d/c abx to cover pseudomonas if culture is not obtained.   Nemesis Rainwater 09/16/2012, 5:42 PM

## 2012-09-16 NOTE — Assessment & Plan Note (Addendum)
R foot improved, now with L foot abscess

## 2012-09-17 ENCOUNTER — Inpatient Hospital Stay (HOSPITAL_COMMUNITY): Payer: Medicaid Other

## 2012-09-17 ENCOUNTER — Inpatient Hospital Stay: Payer: Medicaid Other | Admitting: Family Medicine

## 2012-09-17 LAB — CBC
HCT: 36.8 % — ABNORMAL LOW (ref 39.0–52.0)
Hemoglobin: 13.5 g/dL (ref 13.0–17.0)
MCH: 32 pg (ref 26.0–34.0)
MCV: 87.2 fL (ref 78.0–100.0)
Platelets: 233 10*3/uL (ref 150–400)
RBC: 4.22 MIL/uL (ref 4.22–5.81)
WBC: 9.3 10*3/uL (ref 4.0–10.5)

## 2012-09-17 LAB — GLUCOSE, CAPILLARY
Glucose-Capillary: 138 mg/dL — ABNORMAL HIGH (ref 70–99)
Glucose-Capillary: 132 mg/dL — ABNORMAL HIGH (ref 70–99)
Glucose-Capillary: 148 mg/dL — ABNORMAL HIGH (ref 70–99)
Glucose-Capillary: 256 mg/dL — ABNORMAL HIGH (ref 70–99)

## 2012-09-17 LAB — BASIC METABOLIC PANEL
BUN: 12 mg/dL (ref 6–23)
CO2: 32 mEq/L (ref 19–32)
Calcium: 9.1 mg/dL (ref 8.4–10.5)
Chloride: 102 mEq/L (ref 96–112)
Creatinine, Ser: 0.67 mg/dL (ref 0.50–1.35)
Glucose, Bld: 116 mg/dL — ABNORMAL HIGH (ref 70–99)

## 2012-09-17 MED ORDER — LISINOPRIL 10 MG PO TABS
10.0000 mg | ORAL_TABLET | Freq: Every evening | ORAL | Status: DC
  Administered 2012-09-17 – 2012-09-18 (×2): 10 mg via ORAL
  Filled 2012-09-17 (×3): qty 1

## 2012-09-17 MED ORDER — GADOBENATE DIMEGLUMINE 529 MG/ML IV SOLN
20.0000 mL | Freq: Once | INTRAVENOUS | Status: AC
  Administered 2012-09-17: 20 mL via INTRAVENOUS

## 2012-09-17 MED ORDER — MORPHINE SULFATE 2 MG/ML IJ SOLN
2.0000 mg | INTRAMUSCULAR | Status: DC | PRN
  Administered 2012-09-17: 1 mg via INTRAVENOUS
  Administered 2012-09-17 – 2012-09-19 (×6): 4 mg via INTRAVENOUS
  Filled 2012-09-17 (×3): qty 2
  Filled 2012-09-17: qty 1
  Filled 2012-09-17 (×3): qty 2

## 2012-09-17 NOTE — Progress Notes (Signed)
Spoke with MRI about pt receiving MRI of foot tonight. The MRI machine is currently not working and they will take patient first thing in the AM.

## 2012-09-17 NOTE — Consult Note (Signed)
Reason for Consult: Abscess left foot. Referring Physician: Sholom Bula is an 46 y.o. male.  HPI: Patient is a 46 year old gentleman who has had cellulitis on both feet in the forefoot region. He has been seen multiple times recently has had temporary relief with IV antibiotics and presents at this time with acute exacerbation with a large abscess on the left foot with persistent cellulitis of the right great toe.  Past Medical History  Diagnosis Date  . Hypertension   . Diabetes mellitus     Type 2 DM  x 24 years  . Foreign body in left foot     with  infection    Past Surgical History  Procedure Date  . No past surgeries   . I&d extremity 04/23/2012    Procedure: IRRIGATION AND DEBRIDEMENT EXTREMITY;  Surgeon: Newt Minion, MD;  Location: [facility];  Service: Orthopedics;  Laterality: Left;  Irrigation and Debridement Left Foot    Family History  Problem Relation Age of Onset  . Diabetes type II Mother   . Diabetes type II Sister     Social History:  reports that he quit smoking about 2 weeks ago. His smoking use included Cigarettes. He quit smokeless tobacco use about 2 weeks ago. He reports that he drinks about 4.8 ounces of alcohol per week. He reports that he does not use illicit drugs.  Allergies:  Allergies  Allergen Reactions  . Latex Rash    Pt states he is allergic to latex condoms    Medications: I have reviewed the patient's current medications.  Results for orders placed during the hospital encounter of 09/16/12 (from the past 48 hour(s))  GLUCOSE, CAPILLARY     Status: Abnormal   Collection Time   09/16/12  6:00 PM      Component Value Range Comment   Glucose-Capillary 235 (*) 70 - 99 mg/dL   CBC     Status: Abnormal   Collection Time   09/16/12  6:04 PM      Component Value Range Comment   WBC 11.6 (*) 4.0 - 10.5 K/uL    RBC 4.36  4.22 - 5.81 MIL/uL    Hemoglobin 14.0  13.0 - 17.0 g/dL    HCT 38.5 (*) 39.0 - 52.0 %    MCV 88.3  78.0 - 100.0 fL     MCH 32.1  26.0 - 34.0 pg    MCHC 36.4 (*) 30.0 - 36.0 g/dL    RDW 12.1  11.5 - 15.5 %    Platelets 222  150 - 400 K/uL   COMPREHENSIVE METABOLIC PANEL     Status: Abnormal   Collection Time   09/16/12  6:04 PM      Component Value Range Comment   Sodium 136  135 - 145 mEq/L    Potassium 3.9  3.5 - 5.1 mEq/L    Chloride 98  96 - 112 mEq/L    CO2 29  19 - 32 mEq/L    Glucose, Bld 247 (*) 70 - 99 mg/dL    BUN 14  6 - 23 mg/dL    Creatinine, Ser 0.95  0.50 - 1.35 mg/dL    Calcium 9.2  8.4 - 10.5 mg/dL    Total Protein 7.0  6.0 - 8.3 g/dL    Albumin 2.8 (*) 3.5 - 5.2 g/dL    AST 16  0 - 37 U/L    ALT 18  0 - 53 U/L    Alkaline Phosphatase 98  39 - 117 U/L    Total Bilirubin 0.6  0.3 - 1.2 mg/dL    GFR calc non Af Amer >90  >90 mL/min    GFR calc Af Amer >90  >90 mL/min   GLUCOSE, CAPILLARY     Status: Abnormal   Collection Time   09/16/12  9:42 PM      Component Value Range Comment   Glucose-Capillary 227 (*) 70 - 99 mg/dL   GLUCOSE, CAPILLARY     Status: Abnormal   Collection Time   09/17/12 12:33 AM      Component Value Range Comment   Glucose-Capillary 236 (*) 70 - 99 mg/dL   GLUCOSE, CAPILLARY     Status: Abnormal   Collection Time   09/17/12  4:18 AM      Component Value Range Comment   Glucose-Capillary 138 (*) 70 - 99 mg/dL   CBC     Status: Abnormal   Collection Time   09/17/12  6:15 AM      Component Value Range Comment   WBC 9.3  4.0 - 10.5 K/uL    RBC 4.22  4.22 - 5.81 MIL/uL    Hemoglobin 13.5  13.0 - 17.0 g/dL    HCT 36.8 (*) 39.0 - 52.0 %    MCV 87.2  78.0 - 100.0 fL    MCH 32.0  26.0 - 34.0 pg    MCHC 36.7 (*) 30.0 - 36.0 g/dL    RDW 12.1  11.5 - 15.5 %    Platelets 233  150 - 400 K/uL   BASIC METABOLIC PANEL     Status: Abnormal   Collection Time   09/17/12  6:15 AM      Component Value Range Comment   Sodium 139  135 - 145 mEq/L    Potassium 4.3  3.5 - 5.1 mEq/L    Chloride 102  96 - 112 mEq/L    CO2 32  19 - 32 mEq/L    Glucose, Bld 116 (*)  70 - 99 mg/dL    BUN 12  6 - 23 mg/dL    Creatinine, Ser 0.67  0.50 - 1.35 mg/dL    Calcium 9.1  8.4 - 10.5 mg/dL    GFR calc non Af Amer >90  >90 mL/min    GFR calc Af Amer >90  >90 mL/min   GLUCOSE, CAPILLARY     Status: Abnormal   Collection Time   09/17/12  7:45 AM      Component Value Range Comment   Glucose-Capillary 132 (*) 70 - 99 mg/dL   MRSA PCR SCREENING     Status: Normal   Collection Time   09/17/12 10:02 AM      Component Value Range Comment   MRSA by PCR NEGATIVE  NEGATIVE   GLUCOSE, CAPILLARY     Status: Abnormal   Collection Time   09/17/12 11:59 AM      Component Value Range Comment   Glucose-Capillary 148 (*) 70 - 99 mg/dL   GLUCOSE, CAPILLARY     Status: Abnormal   Collection Time   09/17/12  4:58 PM      Component Value Range Comment   Glucose-Capillary 256 (*) 70 - 99 mg/dL     Mr Foot Left W Wo Contrast  09/17/2012  *RADIOLOGY REPORT*  Clinical Data: Draining ulcer along the dorsum of the forefoot. Evaluate for abscess and osteomyelitis.  MRI OF THE LEFT FOREFOOT WITHOUT AND WITH CONTRAST  Technique:  Multiplanar, multisequence MR  imaging was performed both before and after administration of intravenous contrast.  Contrast: 56mL MULTIHANCE GADOBENATE DIMEGLUMINE 529 MG/ML IV SOLN  Comparison: Radiographs 09/13/2012 and 04/13/2012.  Findings: Study is mildly motion degraded.  There is diffuse soft tissue edema throughout the forefoot, especially dorsally and medially.  There is a complex peripherally enhancing fluid collection dorsally at the first webspace, extending into the base of the second digit.  This fluid collection measures approximately 2.4 x 2.0 x 2.0 cm and is suspicious for a soft tissue abscess. There is extensive surrounding soft tissue enhancement which extends into the first and second toes and proximally along the second metatarsal.  Small areas of decreased soft tissue enhancement are suggested within the plantar and lateral aspect of the great  toe.  There is suggested skin ulceration over the plantar aspect of the first metatarsal head.  No other focal fluid collections are identified.  There is no significant tenosynovitis or joint effusion.  There is an apparent incidental pressure lesion plantar to the fifth metatarsal head.  No specific signs of osteomyelitis are identified.  Specifically, there is no cortical destruction or T1 marrow abnormality.  The fat saturation in the toes is heterogeneous, and this mildly limits assessment for marrow edema in those areas.  IMPRESSION:  1.  Large peripherally enhancing fluid collection dorsally at the first webspace and extending into the second digit consistent with a soft tissue abscess. 2.  Extensive surrounding cellulitis with extension into the first and second toes and intraosseous myositis. Suggested soft tissue ulcer plantar to the first metatarsal head. 3.  No evidence of osteomyelitis.   Original Report Authenticated By: Richardean Sale, M.D.     Review of Systems  All other systems reviewed and are negative.   Blood pressure 166/81, pulse 78, temperature 97.9 F (36.6 C), temperature source Oral, resp. rate 18, height 5\' 11"  (1.803 m), weight 93.441 kg (206 lb), SpO2 100.00%. Physical Exam On examination patient has a good dorsalis pedis pulses bilaterally. He has a large abscess over the second metatarsal left foot. The cellulitis has resolved since his initial episode. The right foot does have cellulitis but appears that most of the abscess has been decompressed. Review of the MRI scan shows a soft tissue abscess over the dorsum of the second metatarsal. Assessment/Plan: Assessment: Soft tissue abscess dorsum of the left foot second metatarsal #2 resolving abscess great toe right foot.  Plan will plan for surgical intervention tomorrow anticipate a soft tissue procedure with decompression the abscess cleansing of necrotic tissue placement of antibiotic beads possible placement of a  wound VAC. Risks and benefits were discussed patient states he understands and wished to proceed at this time we'll set this up for surgery Friday afternoon.  Felix Pratt V 09/17/2012, 5:59 PM

## 2012-09-17 NOTE — Progress Notes (Signed)
Family Medicine Teaching Service Daily Progress Note Service Page: [phone]  Patient Assessment: 46 yo M with DM recently admitted 12/8-12/9 for RIGHT foot cellulitis and abscess now with LEFT foot cellulitis and abscess, having failed an outpatient course of clindamycin.  Subjective: This morning patient says he is doing well and feeling well apart from his foot. He is hungry and wants to eat.  Objective: Temp:  [97.7 F (36.5 C)-99 F (37.2 C)] 97.7 F (36.5 C) (12/12 0531) Pulse Rate:  [74-102] 74  (12/12 0531) Resp:  [18-19] 18  (12/12 0531) BP: (101-175)/(66-88) 156/88 mmHg (12/12 0531) SpO2:  [98 %-100 %] 100 % (12/12 0531) Weight:  [206 lb (93.441 kg)] 206 lb (93.441 kg) (12/11 1754) Exam: General: NAD, pleasant, cooperative Cardiovascular: RRR Respiratory: NWOB Neuro: nonfocal, speech intact Extremities: LEFT foot with drainage and erythema, tender to palpation over drainage site, RIGHT with crusted area over big toe (see picture below)     I have reviewed the patient's medications, labs, imaging, and diagnostic testing.  Notable results are summarized below.  Medications:  Scheduled Meds: Zosyn IV q8h Vanc IV q8h Senna BID Lantus 5U QHS Moderate SSI  PRN Meds: Tylenol 650mg  q6h prn Morphine 2-4mg  q4h prn Zofran 4mg  q6h prn  IVF: NS @ 125 cc/hr  Labs:  CBC  Lab 09/17/12 0615 09/16/12 1804 09/14/12 0520  WBC 9.3 11.6* 11.5*  HGB 13.5 14.0 12.7*  HCT 36.8* 38.5* 34.9*  PLT 233 222 152    BMET  Lab 09/17/12 0615 09/16/12 1804 09/14/12 0520  NA 139 136 135  K 4.3 3.9 3.5  CL 102 98 102  CO2 32 29 28  BUN 12 14 12   CREATININE 0.67 0.95 0.57  GLUCOSE 116* 247* 255*  CALCIUM 9.1 9.2 8.2*    Imaging/Diagnostic Tests:  MRI L foot 09/17/12: 1. Large peripherally enhancing fluid collection dorsally at the first webspace and extending into the second digit consistent with a soft tissue abscess.  2. Extensive surrounding cellulitis with extension  into the first and second toes and intraosseous myositis. Suggested soft tissue ulcer plantar to the first metatarsal head.  3. No evidence of osteomyelitis.   Assessment/Plan:  46 yo M with DM recently admitted 12/8-12/9 for RIGHT foot cellulitis and abscess now with LEFT foot cellulitis and abscess   # LEFT foot/toe cellulitis and abscess:  -likely meeting Sepsis criteria upon admission, but vitals have normalized since that time -continue IV Vanc/Zosyn -MRI left foot shows large abscess with myositis. Will consult orthopedics today for possible surgical intervention. -Right foot shows improvement in erythema -Culture obtained by RN this morning, will f/u on results -continue pain control with IV morphine   # Hypertension:  -relatively hypotensive upon admission, now with high BP's  -will restart home lisinopril today  # DM: poorly controlled based on A1c of 8.8 on 12/6  -Hold home metformin  -Lantus 5U + SSI   # FEN/GI:  -NPO for now pending surgical evaluation -NS @ 125cc/hr while NPO   # Ppx:  -SQ lovenox   # Dispo:  -d/c pending clinical improvement -may need to consider broadening discharge abx to cover pseudomonas if culture is not obtained.   Chrisandra Netters, MD Family Medicine PGY-1

## 2012-09-17 NOTE — Progress Notes (Signed)
Spoke with Dr. Marily Memos  Regarding patient's pain medication. Received orders to increase Morphine to 2-4 mg Q4H prn and may take PO tylenol for headache.

## 2012-09-18 ENCOUNTER — Encounter (HOSPITAL_COMMUNITY): Payer: Self-pay | Admitting: Anesthesiology

## 2012-09-18 ENCOUNTER — Encounter (HOSPITAL_COMMUNITY)
Admission: AD | Disposition: A | Discharge status: Home / Self Care | Payer: Self-pay | Source: Ambulatory Visit | Attending: Family Medicine

## 2012-09-18 ENCOUNTER — Inpatient Hospital Stay (HOSPITAL_COMMUNITY): Payer: Medicaid Other | Admitting: Anesthesiology

## 2012-09-18 HISTORY — PX: I & D EXTREMITY: SHX5045

## 2012-09-18 LAB — GLUCOSE, CAPILLARY
Glucose-Capillary: 135 mg/dL — ABNORMAL HIGH (ref 70–99)
Glucose-Capillary: 151 mg/dL — ABNORMAL HIGH (ref 70–99)
Glucose-Capillary: 198 mg/dL — ABNORMAL HIGH (ref 70–99)
Glucose-Capillary: 280 mg/dL — ABNORMAL HIGH (ref 70–99)

## 2012-09-18 SURGERY — IRRIGATION AND DEBRIDEMENT EXTREMITY
Anesthesia: General | Site: Foot | Laterality: Left | Wound class: Dirty or Infected

## 2012-09-18 MED ORDER — OXYCODONE HCL 5 MG PO TABS: 5.0000 mg | ORAL_TABLET | Freq: Once | ORAL | Status: AC | PRN

## 2012-09-18 MED ORDER — MEPERIDINE HCL 25 MG/ML IJ SOLN: 6.2500 mg | INTRAMUSCULAR | Status: DC | PRN

## 2012-09-18 MED ORDER — MIDAZOLAM HCL 5 MG/5ML IJ SOLN
INTRAMUSCULAR | Status: DC | PRN
  Administered 2012-09-18: 2 mg via INTRAVENOUS

## 2012-09-18 MED ORDER — FENTANYL CITRATE 0.05 MG/ML IJ SOLN: 50.0000 ug | INTRAMUSCULAR | Status: DC | PRN

## 2012-09-18 MED ORDER — ONDANSETRON HCL 4 MG PO TABS: 4.0000 mg | ORAL_TABLET | Freq: Four times a day (QID) | ORAL | Status: DC | PRN

## 2012-09-18 MED ORDER — SODIUM CHLORIDE 0.9 % IR SOLN
Status: DC | PRN
  Administered 2012-09-18: 1000 mL

## 2012-09-18 MED ORDER — HYDROMORPHONE HCL PF 1 MG/ML IJ SOLN
0.2500 mg | INTRAMUSCULAR | Status: DC | PRN
  Administered 2012-09-18: 0.5 mg via INTRAVENOUS

## 2012-09-18 MED ORDER — CHLORHEXIDINE GLUCONATE 4 % EX LIQD
60.0000 mL | Freq: Once | CUTANEOUS | Status: AC
  Administered 2012-09-18: 4 via TOPICAL
  Filled 2012-09-18: qty 60

## 2012-09-18 MED ORDER — OXYCODONE HCL 5 MG/5ML PO SOLN
5.0000 mg | Freq: Once | ORAL | Status: AC | PRN
Start: 2012-09-18 — End: 2012-09-18

## 2012-09-18 MED ORDER — LACTATED RINGERS IV SOLN
INTRAVENOUS | Status: DC
  Administered 2012-09-18 (×2): via INTRAVENOUS

## 2012-09-18 MED ORDER — HYDROCODONE-ACETAMINOPHEN 5-325 MG PO TABS: 1.0000 | ORAL_TABLET | ORAL | Status: DC | PRN

## 2012-09-18 MED ORDER — METOCLOPRAMIDE HCL 10 MG PO TABS: 5.0000 mg | ORAL_TABLET | Freq: Three times a day (TID) | ORAL | Status: DC | PRN

## 2012-09-18 MED ORDER — MIDAZOLAM HCL 2 MG/2ML IJ SOLN: 1.0000 mg | INTRAMUSCULAR | Status: DC | PRN

## 2012-09-18 MED ORDER — WARFARIN - PHARMACIST DOSING INPATIENT
Freq: Every day | Status: DC
  Administered 2012-09-18: 17:00:00

## 2012-09-18 MED ORDER — HYDROMORPHONE HCL PF 1 MG/ML IJ SOLN
0.5000 mg | INTRAMUSCULAR | Status: DC | PRN
  Administered 2012-09-18 – 2012-09-19 (×2): 1 mg via INTRAVENOUS
  Filled 2012-09-18 (×2): qty 1

## 2012-09-18 MED ORDER — HYDROMORPHONE HCL PF 1 MG/ML IJ SOLN
INTRAMUSCULAR | Status: AC
  Filled 2012-09-18: qty 1

## 2012-09-18 MED ORDER — ONDANSETRON HCL 4 MG/2ML IJ SOLN
4.0000 mg | Freq: Four times a day (QID) | INTRAMUSCULAR | Status: DC | PRN
  Administered 2012-09-18: 4 mg via INTRAVENOUS

## 2012-09-18 MED ORDER — ONDANSETRON HCL 4 MG/2ML IJ SOLN
INTRAMUSCULAR | Status: DC | PRN
  Administered 2012-09-18: 4 mg via INTRAVENOUS

## 2012-09-18 MED ORDER — FENTANYL CITRATE 0.05 MG/ML IJ SOLN
INTRAMUSCULAR | Status: DC | PRN
  Administered 2012-09-18: 50 ug via INTRAVENOUS
  Administered 2012-09-18: 100 ug via INTRAVENOUS

## 2012-09-18 MED ORDER — PROMETHAZINE HCL 25 MG/ML IJ SOLN: 6.2500 mg | INTRAMUSCULAR | Status: DC | PRN

## 2012-09-18 MED ORDER — WARFARIN SODIUM 10 MG PO TABS
10.0000 mg | ORAL_TABLET | Freq: Once | ORAL | Status: AC
  Administered 2012-09-18: 10 mg via ORAL
  Filled 2012-09-18: qty 1

## 2012-09-18 MED ORDER — PROPOFOL 10 MG/ML IV BOLUS
INTRAVENOUS | Status: DC | PRN
  Administered 2012-09-18: 170 mg via INTRAVENOUS

## 2012-09-18 MED ORDER — WARFARIN VIDEO
Freq: Once | Status: AC
  Administered 2012-09-19: 14:00:00

## 2012-09-18 MED ORDER — OXYCODONE-ACETAMINOPHEN 5-325 MG PO TABS
1.0000 | ORAL_TABLET | ORAL | Status: DC | PRN
  Administered 2012-09-18 – 2012-09-19 (×3): 2 via ORAL
  Filled 2012-09-18 (×3): qty 2

## 2012-09-18 MED ORDER — COUMADIN BOOK
Freq: Once | Status: AC
  Administered 2012-09-18: 17:00:00
  Filled 2012-09-18: qty 1

## 2012-09-18 MED ORDER — LACTATED RINGERS IV SOLN
INTRAVENOUS | Status: DC | PRN
  Administered 2012-09-18: 13:00:00 via INTRAVENOUS

## 2012-09-18 MED ORDER — LIDOCAINE HCL (CARDIAC) 20 MG/ML IV SOLN
INTRAVENOUS | Status: DC | PRN
  Administered 2012-09-18: 70 mg via INTRAVENOUS

## 2012-09-18 MED ORDER — LACTATED RINGERS IV SOLN: INTRAVENOUS | Status: DC

## 2012-09-18 MED ORDER — SODIUM CHLORIDE 0.9 % IV SOLN
INTRAVENOUS | Status: DC
  Administered 2012-09-18: 1000 mL via INTRAVENOUS

## 2012-09-18 MED ORDER — METOCLOPRAMIDE HCL 5 MG/ML IJ SOLN: 5.0000 mg | Freq: Three times a day (TID) | INTRAMUSCULAR | Status: DC | PRN

## 2012-09-18 SURGICAL SUPPLY — 51 items
BANDAGE GAUZE ELAST BULKY 4 IN (GAUZE/BANDAGES/DRESSINGS) ×4 IMPLANT
BLADE SURG 10 STRL SS (BLADE) IMPLANT
BNDG COHESIVE 4X5 TAN STRL (GAUZE/BANDAGES/DRESSINGS) ×4 IMPLANT
BNDG COHESIVE 6X5 TAN STRL LF (GAUZE/BANDAGES/DRESSINGS) IMPLANT
BNDG GAUZE STRTCH 6 (GAUZE/BANDAGES/DRESSINGS) IMPLANT
CLOTH BEACON ORANGE TIMEOUT ST (SAFETY) ×2 IMPLANT
COTTON STERILE ROLL (GAUZE/BANDAGES/DRESSINGS) IMPLANT
COVER SURGICAL LIGHT HANDLE (MISCELLANEOUS) ×2 IMPLANT
CUFF TOURNIQUET SINGLE 18IN (TOURNIQUET CUFF) IMPLANT
CUFF TOURNIQUET SINGLE 24IN (TOURNIQUET CUFF) IMPLANT
CUFF TOURNIQUET SINGLE 34IN LL (TOURNIQUET CUFF) IMPLANT
CUFF TOURNIQUET SINGLE 44IN (TOURNIQUET CUFF) IMPLANT
DRAPE EXTREMITY BILATERAL (DRAPE) ×2 IMPLANT
DRAPE U-SHAPE 47X51 STRL (DRAPES) ×4 IMPLANT
DRSG ADAPTIC 3X8 NADH LF (GAUZE/BANDAGES/DRESSINGS) ×4 IMPLANT
DRSG PAD ABDOMINAL 8X10 ST (GAUZE/BANDAGES/DRESSINGS) ×4 IMPLANT
DURAPREP 26ML APPLICATOR (WOUND CARE) ×4 IMPLANT
ELECT CAUTERY BLADE 6.4 (BLADE) IMPLANT
ELECT REM PT RETURN 9FT ADLT (ELECTROSURGICAL) ×2
ELECTRODE REM PT RTRN 9FT ADLT (ELECTROSURGICAL) ×1 IMPLANT
GLOVE BIOGEL PI IND STRL 6.5 (GLOVE) ×1 IMPLANT
GLOVE BIOGEL PI IND STRL 9 (GLOVE) IMPLANT
GLOVE BIOGEL PI INDICATOR 6.5 (GLOVE) ×1
GLOVE BIOGEL PI INDICATOR 9 (GLOVE)
GLOVE SS PI 9.0 STRL (GLOVE) ×4 IMPLANT
GLOVE SURG ORTHO 9.0 STRL STRW (GLOVE) IMPLANT
GLOVE SURG SS PI 6.5 STRL IVOR (GLOVE) ×2 IMPLANT
GOWN PREVENTION PLUS XLARGE (GOWN DISPOSABLE) IMPLANT
GOWN SRG XL XLNG 56XLVL 4 (GOWN DISPOSABLE) ×1 IMPLANT
GOWN STRL NON-REIN XL XLG LVL4 (GOWN DISPOSABLE) ×1
HANDPIECE INTERPULSE COAX TIP (DISPOSABLE)
KIT BASIN OR (CUSTOM PROCEDURE TRAY) ×2 IMPLANT
KIT ROOM TURNOVER OR (KITS) ×2 IMPLANT
MANIFOLD NEPTUNE II (INSTRUMENTS) IMPLANT
NS IRRIG 1000ML POUR BTL (IV SOLUTION) ×2 IMPLANT
PACK ORTHO EXTREMITY (CUSTOM PROCEDURE TRAY) ×2 IMPLANT
PAD ARMBOARD 7.5X6 YLW CONV (MISCELLANEOUS) ×2 IMPLANT
PADDING CAST COTTON 6X4 STRL (CAST SUPPLIES) IMPLANT
SET HNDPC FAN SPRY TIP SCT (DISPOSABLE) IMPLANT
SPONGE GAUZE 4X4 12PLY (GAUZE/BANDAGES/DRESSINGS) ×4 IMPLANT
SPONGE LAP 18X18 X RAY DECT (DISPOSABLE) ×2 IMPLANT
STOCKINETTE IMPERVIOUS 9X36 MD (GAUZE/BANDAGES/DRESSINGS) ×4 IMPLANT
SUT ETHILON 2 0 PSLX (SUTURE) ×4 IMPLANT
SWAB COLLECTION DEVICE MRSA (MISCELLANEOUS) ×2 IMPLANT
TOWEL OR 17X24 6PK STRL BLUE (TOWEL DISPOSABLE) ×2 IMPLANT
TOWEL OR 17X26 10 PK STRL BLUE (TOWEL DISPOSABLE) ×2 IMPLANT
TUBE ANAEROBIC SPECIMEN COL (MISCELLANEOUS) ×2 IMPLANT
TUBE CONNECTING 12X1/4 (SUCTIONS) ×2 IMPLANT
UNDERPAD 30X30 INCONTINENT (UNDERPADS AND DIAPERS) IMPLANT
WATER STERILE IRR 1000ML POUR (IV SOLUTION) IMPLANT
YANKAUER SUCT BULB TIP NO VENT (SUCTIONS) ×2 IMPLANT

## 2012-09-18 NOTE — Progress Notes (Signed)
Orthopedic Tech Progress Note Patient Details:  [name] Aug 01, 1966 [id number]  Ortho Devices Type of Ortho Device: Postop shoe/boot Ortho Device/Splint Location: bilateral Ortho Device/Splint Interventions: Application   [name] 09/18/2012, 4:20 PM

## 2012-09-18 NOTE — Anesthesia Preprocedure Evaluation (Addendum)
Anesthesia Evaluation  Patient identified by MRN, date of birth, ID band  Reviewed: H&P , NPO status , Patient's Chart, lab work & pertinent test results  History of Anesthesia Complications Negative for: history of anesthetic complications  Airway Mallampati: II  Neck ROM: Full    Dental  (+) Poor Dentition   Pulmonary  breath sounds clear to auscultation        Cardiovascular hypertension, Rhythm:Regular Rate:Normal     Neuro/Psych    GI/Hepatic   Endo/Other  diabetes  Renal/GU      Musculoskeletal   Abdominal (+) + obese,   Peds  Hematology   Anesthesia Other Findings   Reproductive/Obstetrics                           Anesthesia Physical Anesthesia Plan  ASA: II  Anesthesia Plan:    Post-op Pain Management:    Induction:   Airway Management Planned:   Additional Equipment:   Intra-op Plan:   Post-operative Plan:   Informed Consent:   Plan Discussed with:   Anesthesia Plan Comments:         Anesthesia Quick Evaluation

## 2012-09-18 NOTE — Progress Notes (Signed)
ANTICOAGULATION CONSULT NOTE - Initial Consult  Pharmacy Consult for Coumadin  Indication: VTE prophylaxis  Allergies  Allergen Reactions  . Latex Rash    Pt states he is allergic to latex condoms    Patient Measurements: Height: 5\' 11"  (180.3 cm) Weight: 206 lb (93.441 kg) IBW/kg (Calculated) : 75.3  Heparin Dosing Weight:   Vital Signs: Temp: 98.1 F (36.7 C) (12/13 1516) Temp src: Oral (12/13 1516) BP: 164/96 mmHg (12/13 1516) Pulse Rate: 76  (12/13 1516)  Labs:  [facility] 09/17/12 0615 09/16/12 1804  HGB 13.5 14.0  HCT 36.8* 38.5*  PLT 233 222  APTT -- --  LABPROT -- --  INR -- --  HEPARINUNFRC -- --  CREATININE 0.67 0.95  CKTOTAL -- --  CKMB -- --  TROPONINI -- --    Estimated Creatinine Clearance: 137.5 ml/min (by C-G formula based on Cr of 0.67).   Medical History: Past Medical History  Diagnosis Date  . Hypertension   . Diabetes mellitus     Type 2 DM  x 24 years  . Foreign body in left foot     with  infection    Medications:  Scheduled:    . [COMPLETED] chlorhexidine  60 mL Topical Once  . coumadin book   Does not apply Once  . HYDROmorphone      . insulin aspart  0-15 Units Subcutaneous Q4H  . insulin glargine  5 Units Subcutaneous QHS  . lisinopril  10 mg Oral QPM  . piperacillin-tazobactam (ZOSYN)  IV  3.375 g Intravenous Q8H  . senna  1 tablet Oral BID  . vancomycin  1,000 mg Intravenous Q8H  . warfarin  10 mg Oral ONCE-1800  . warfarin   Does not apply Once  . Warfarin - Pharmacist Dosing Inpatient   Does not apply q1800    Assessment: 46 yr old male with cellulitis and abscesses of both feet. Pt has had I and D of both feet. Is on Zosyn/ Vanc IV. Now to start coumadin for VTE prophylaxis.  Goal of Therapy:  INR 2-3 Monitor platelets by anticoagulation protocol: Yes   Plan:  Will give 10 mg coumadin tonight.  Daily PT/INR Coumadin booklet and video to pt.  Minta Balsam 09/18/2012,4:29 PM

## 2012-09-18 NOTE — Progress Notes (Signed)
Family Medicine Teaching Service Attending Note  I interviewed and examined patient [name] and reviewed their tests and x-rays.  I discussed with Dr. Ardelia Mems and reviewed their note for today.  I agree with their assessment and plan.     Additionally  No systemic sxs of infection or osteo on IV antibiotics For surgery today

## 2012-09-18 NOTE — Transfer of Care (Signed)
Immediate Anesthesia Transfer of Care Note  Patient: [name]  Procedure(s) Performed: Procedure(s) (LRB) with comments: IRRIGATION AND DEBRIDEMENT EXTREMITY (Left)  Patient Location: PACU  Anesthesia Type:General  Level of Consciousness: awake, alert  and oriented  Airway & Oxygen Therapy: Patient Spontanous Breathing and Patient connected to nasal cannula oxygen  Post-op Assessment: Report given to PACU RN, Post -op Vital signs reviewed and stable and Patient moving all extremities  Post vital signs: Reviewed and stable  Complications: none

## 2012-09-18 NOTE — Progress Notes (Signed)
Family Medicine Teaching Service Attending Note  On 12-12 I interviewed and examined patient [name] and reviewed their tests and x-rays.  I discussed with Dr. Ardelia Mems and reviewed their note for today.  I agree with their assessment and plan.

## 2012-09-18 NOTE — Anesthesia Postprocedure Evaluation (Signed)
  Anesthesia Post-op Note  Patient: [name]  Procedure(s) Performed: Procedure(s) (LRB) with comments: IRRIGATION AND DEBRIDEMENT EXTREMITY (Left)  Patient Location: PACU  Anesthesia Type:General  Level of Consciousness: awake and sedated  Airway and Oxygen Therapy: Patient Spontanous Breathing  Post-op Pain: mild  Post-op Assessment: Post-op Vital signs reviewed  Post-op Vital Signs: stable  Complications: No apparent anesthesia complications

## 2012-09-18 NOTE — Progress Notes (Signed)
Family Medicine Teaching Service Daily Progress Note Service Page: [phone]  Patient Assessment: 46 yo M with DM recently admitted 12/8-12/9 for RIGHT foot cellulitis and abscess now with LEFT foot cellulitis and abscess, having failed an outpatient course of clindamycin.  Subjective: This morning patient says he is feeling okay. He's a little nervous about his surgery. He's been trying not to use his pain medicine much, but I encouraged him to use it if he needs it.  Objective: Temp:  [97.9 F (36.6 C)-98.8 F (37.1 C)] 98.8 F (37.1 C) (12/12 2116) Pulse Rate:  [78-83] 83  (12/12 2116) Resp:  [18-19] 19  (12/12 2116) BP: (157-166)/(80-81) 157/80 mmHg (12/12 2116) SpO2:  [100 %] 100 % (12/12 2116) Exam: General: NAD, pleasant, cooperative Cardiovascular: RRR Respiratory: NWOB Abd: soft, nontender Neuro: nonfocal, speech intact Extremities: LEFT foot with increase in visualized tension over dorsal surface abscess, ~1cm of darkened eschar in center and surrounding erythema. RIGHT foot with crust and erythema over big toe as well. See picture below. Calves are nontender to palpation.     I have reviewed the patient's medications, labs, imaging, and diagnostic testing.  Notable results are summarized below.  Medications:  Scheduled Meds: Zosyn IV q8h Vanc IV q8h Senna BID Lantus 5U QHS Moderate SSI  PRN Meds: Tylenol 650mg  q6h prn Morphine 2-4mg  q4h prn Zofran 4mg  q6h prn  IVF: NS @ 125 cc/hr  Labs:  Foot cx reincubated for better growth MRSA nasal screen negative  Imaging/Diagnostic Tests:  MRI L foot 09/17/12: 1. Large peripherally enhancing fluid collection dorsally at the first webspace and extending into the second digit consistent with a soft tissue abscess.  2. Extensive surrounding cellulitis with extension into the first and second toes and intraosseous myositis. Suggested soft tissue ulcer plantar to the first metatarsal head.  3. No evidence of  osteomyelitis.  Assessment/Plan:  46 yo M with DM recently admitted 12/8-12/9 for RIGHT foot cellulitis and abscess now with LEFT foot cellulitis and abscess   # LEFT foot/toe cellulitis and abscess:  -likely meeting Sepsis criteria upon admission, but vitals have normalized since that time -continue IV Vanc/Zosyn -MRI left foot shows large abscess with myositis. Dr. Sharol Given of orthopedics planning to take pt to OR this afternoon. -Right foot shows improvement in erythema -f/u wound cx - reincubated for better growth -continue pain control with IV morphine   # Hypertension:  -relatively hypotensive upon admission, since resolved and now with some higher BP's  -continue home lisinopril  # DM: poorly controlled based on A1c of 8.8 on 12/6  -CBG in last 24 hours: 148-280 -Hold home metformin  -Lantus 5U + SSI   # FEN/GI:  -NPO for now while surgery is planned for this afternoon -NS @ 125cc/hr while NPO   # Ppx:  -SQ lovenox   # Dispo:  -d/c pending clinical improvement after surgery -may need to consider broadening discharge abx to cover pseudomonas if culture is not obtained.   Chrisandra Netters, MD Family Medicine PGY-1

## 2012-09-18 NOTE — Op Note (Signed)
OPERATIVE REPORT  DATE OF SURGERY: 09/18/2012  PATIENT:  [name],  46 y.o. male  PRE-OPERATIVE DIAGNOSIS:  Foot Abscess bilateral  POST-OPERATIVE DIAGNOSIS:  Bilateral foot abscess.  PROCEDURE:  Procedure(s): IRRIGATION AND DEBRIDEMENT EXTREMITY Excisional debridement bilateral feet of skin soft tissue muscle and tendon. Local tissue rearrangement for necrotic wound right foot 5 cm x 3 cm.  SURGEON:  Surgeon(s): Newt Minion, MD  ANESTHESIA:   general  EBL:  Minimal ML  SPECIMEN:  No Specimen  Cultures obtained x2 from a deep abscess approximately 15 cc of fluid.  TOURNIQUET:  * No tourniquets in log *  PROCEDURE DETAILS: Patient is a 46 year old gentleman with diabetic neuropathy who has had recurrent ulcerations on both feet. Do to abscess recurrent ulcerations patient presents at this time for surgical intervention. Risks and benefits were discussed including persistent infection neurovascular injury nonhealing of the wound potential for additional surgery. Patient states he understands and wishes to proceed at this time. Description of procedure patient was brought to the operating room and underwent a general anesthetic. After adequate levels of anesthesia were obtained bilateral lower extremities were prepped using DuraPrep and draped into a sterile field. Attention was first focused on the left foot. A 5 cm incision was made dorsally over the foot this was carried down through a large abscess. Cultures were obtained x2. Necrotic muscle skin tendon and soft tissue were excised with a Ronjair and a knife. The wound was irrigated with normal saline this is debrided back to healthy viable tissue. The local tissue was then rearranged to allow for closure do to the soft tissue defect from excision of necrotic skin. The local tissue rearrangement was 5 x 3 cm. Attention was then focused on the right foot. Skin soft tissue tendon was excised from the dorsum of the right great toe.  This is debrided back to healthy viable granulation tissue. Both feet were then dressed with Adaptic 4 x 4's ABDs Kerlix and Coban. Patient was extubated taken to the PACU in stable condition.  PLAN OF CARE: Admit to inpatient   PATIENT DISPOSITION:  PACU - hemodynamically stable.   Newt Minion, MD 09/18/2012 1:55 PM

## 2012-09-18 NOTE — Preoperative (Signed)
Beta Blockers   Reason not to administer Beta Blockers:Not Applicable 

## 2012-09-19 LAB — GLUCOSE, CAPILLARY: Glucose-Capillary: 183 mg/dL — ABNORMAL HIGH (ref 70–99)

## 2012-09-19 LAB — WOUND CULTURE: Special Requests: NORMAL

## 2012-09-19 LAB — PROTIME-INR
INR: 1.11 (ref 0.00–1.49)
Prothrombin Time: 14.2 seconds (ref 11.6–15.2)

## 2012-09-19 MED ORDER — MORPHINE SULFATE 2 MG/ML IJ SOLN
1.0000 mg | INTRAMUSCULAR | Status: DC | PRN
  Administered 2012-09-20: 1 mg via INTRAVENOUS
  Filled 2012-09-19: qty 1

## 2012-09-19 MED ORDER — OXYCODONE HCL 5 MG PO TABS
10.0000 mg | ORAL_TABLET | Freq: Four times a day (QID) | ORAL | Status: DC
  Administered 2012-09-19 – 2012-09-21 (×7): 10 mg via ORAL
  Filled 2012-09-19 (×8): qty 2

## 2012-09-19 MED ORDER — LISINOPRIL 20 MG PO TABS
20.0000 mg | ORAL_TABLET | Freq: Every evening | ORAL | Status: DC
  Administered 2012-09-19 – 2012-09-20 (×2): 20 mg via ORAL
  Filled 2012-09-19 (×3): qty 1

## 2012-09-19 MED ORDER — INSULIN ASPART 100 UNIT/ML ~~LOC~~ SOLN
0.0000 [IU] | Freq: Three times a day (TID) | SUBCUTANEOUS | Status: DC
  Administered 2012-09-20: 11 [IU] via SUBCUTANEOUS
  Administered 2012-09-20: 3 [IU] via SUBCUTANEOUS
  Administered 2012-09-21 (×2): 5 [IU] via SUBCUTANEOUS

## 2012-09-19 MED ORDER — WARFARIN SODIUM 10 MG PO TABS
10.0000 mg | ORAL_TABLET | Freq: Once | ORAL | Status: AC
  Administered 2012-09-19: 10 mg via ORAL
  Filled 2012-09-19: qty 1

## 2012-09-19 NOTE — Progress Notes (Signed)
Family Medicine Teaching Service Attending Note  I interviewed and examined patient [name] and reviewed their tests and x-rays.  I discussed with Dr. Ardelia Mems and reviewed their note for today.  I agree with their assessment and plan.     Additionally  Feeling better no fever or nausea or vomiting Afebrile Continue IV antibiotics.  Await cultures and clinical apperance of improvement

## 2012-09-19 NOTE — Progress Notes (Signed)
Patient ID: [name], male   DOB: 1966-04-17, 46 y.o.   MRN: [id number] Coban dressing dry right and left.   Pain controlled with PO pain meds.

## 2012-09-19 NOTE — Progress Notes (Signed)
Family Medicine Teaching Service Daily Progress Note Service Page: [phone]  Patient Assessment: 46 yo M with DM recently admitted 12/8-12/9 for RIGHT foot cellulitis and abscess now with LEFT foot cellulitis and abscess, having failed an outpatient course of clindamycin.  Subjective: Yesterday afternoon pt went to OR for irrigation and debridement of bilateral feet. This morning patient says his feet are hurting him. Pain medication is helping, but he thinks he might benefit from an increased dose. Able to eat and drink.   Objective: Temp:  [97 F (36.1 C)-98.6 F (37 C)] 98 F (36.7 C) (12/14 0558) Pulse Rate:  [76-88] 82  (12/13 2203) Resp:  [15-22] 17  (12/14 0558) BP: (122-172)/(68-96) 160/86 mmHg (12/14 0558) SpO2:  [94 %-100 %] 97 % (12/14 0558) Exam: General: NAD, pleasant, cooperative Cardiovascular: RRR Respiratory: NWOB, CTAB via anterior auscultation Abd: soft, nontender Neuro: nonfocal, speech intact Extremities: bilateral feet wrapped in bandages, did not unwrap. Calves nontender to palpation.  I have reviewed the patient's medications, labs, imaging, and diagnostic testing.  Notable results are summarized below.  Medications:  Scheduled Meds: Zosyn IV q8h Vanc IV q8h Senna BID Lisinopril 10mg  daily Coumadin per pharmacy Lantus 5U QHS Moderate SSI  PRN Meds: Tylenol 650mg  q6h prn Hydrocodone/Acet 5-325 1-2tab q4h prn Dilaudid 0.5-1mg  IV q2h prn Morphine 2-4mg  q4h prn Reglan 5-10mg  q8hprn IV if zofran ineffective Oxycodone/Acet 5-325 1-2tab q4h prn Zofran 4mg  q6h prn  IVF: NS @ 125 cc/hr  Labs:  Foot cx reincubated for better growth Wound cx no growth to date MRSA nasal screen negative  Imaging/Diagnostic Tests:  MRI L foot 09/17/12: 1. Large peripherally enhancing fluid collection dorsally at the first webspace and extending into the second digit consistent with a soft tissue abscess.  2. Extensive surrounding cellulitis with extension into  the first and second toes and intraosseous myositis. Suggested soft tissue ulcer plantar to the first metatarsal head.  3. No evidence of osteomyelitis.  Assessment/Plan:  46 yo M with DM recently admitted 12/8-12/9 for RIGHT foot cellulitis and abscess now with LEFT foot cellulitis and abscess, taken to OR for debridement of bilateral feet tissue on 09/18/12  # BILATERAL foot/toe cellulitis and abscess:  -likely meeting Sepsis criteria upon admission, but vitals have normalized since that time -POD #1 s/p debridement in OR -continue IV Vanc/Zosyn -f/u wound cx - reincubated for better growth -narrow pain medications to percocet & morphine (has had norco & dilaudid ordered as well), but will increase frequency of morphine to q2h due to pain. -place on continuous pulse ox while receiving so much pain medication  # Hypertension:  -relatively hypotensive upon admission, since resolved and now with some higher BP's  -continue home lisinopril 10mg  -will consider adding additional agent for BP control since BP's remain elevated  # DM: poorly controlled based on A1c of 8.8 on 12/6  -CBG in last 24 hours: 125-198 -Holding home metformin  -Lantus 5U + SSI   # FEN/GI:  -carb modified diet -KVO IV  # Ppx:  -coumadin  # Dispo:  -d/c pending clinical improvement after surgery -may need to consider broadening discharge abx to cover pseudomonas if culture is not obtained.   Chrisandra Netters, MD Family Medicine PGY-1

## 2012-09-19 NOTE — Evaluation (Signed)
Physical Therapy Evaluation Patient Details Name: [name] MRN: [id number] DOB: 1966-01-01 Today's Date: 09/19/2012 Time: [id number] PT Time Calculation (min): 18 min  PT Assessment / Plan / Recommendation Clinical Impression  Pt s/p bilateral I&D. Pt moving well, although requires increased assistance and cueing to maintain NWB on LLE and safe sequencing. Pt will benefit from skilled PT in the acute care setting in order to maximize mobility and safety prior to d/c home    PT Assessment  Patient needs continued PT services    Follow Up Recommendations  No PT follow up;Supervision for mobility/OOB    Does the patient have the potential to tolerate intense rehabilitation      Barriers to Discharge        Equipment Recommendations  Rolling walker with 5" wheels;Wheelchair (measurements PT) [facility] with elevating leg rests)    Recommendations for Other Services     Frequency Min 4X/week    Precautions / Restrictions Precautions Precautions: None Restrictions Weight Bearing Restrictions: Yes RLE Weight Bearing: Weight bearing as tolerated LLE Weight Bearing: Non weight bearing   Pertinent Vitals/Pain Pain in feet, R>L 5/10      Mobility  Bed Mobility Bed Mobility: Supine to Sit;Sitting - Scoot to Edge of Bed;Sit to Supine Supine to Sit: 5: Supervision Sitting - Scoot to Edge of Bed: 5: Supervision Sit to Supine: 5: Supervision Details for Bed Mobility Assistance: VC for safety to avoid bearing weight through LLE during bed mobility Transfers Transfers: Sit to Stand;Stand to Sit Sit to Stand: 4: Min guard;With upper extremity assist;From bed Stand to Sit: 4: Min guard;With upper extremity assist;To bed Details for Transfer Assistance: VC for proper hand placement and safety to avoid weightbearing through LLE as well as safety to/from RW. Ambulation/Gait Ambulation/Gait Assistance: 4: Min assist Ambulation Distance (Feet): 100 Feet Assistive device: Rolling  walker Ambulation/Gait Assistance Details: Min assist for stability. Pt required multiple standing rest breaks secondary to fatgiue. Pt with good awareness of LLE NWB Gait Pattern: Step-to pattern;Decreased step length - left Gait velocity: slow gait speed    Shoulder Instructions     Exercises     PT Diagnosis: Difficulty walking;Acute pain  PT Problem List: Decreased activity tolerance;Decreased mobility;Decreased knowledge of use of DME;Decreased safety awareness;Decreased knowledge of precautions;Pain PT Treatment Interventions: DME instruction;Gait training;Stair training;Therapeutic activities;Functional mobility training;Patient/family education   PT Goals Acute Rehab PT Goals PT Goal Formulation: With patient Time For Goal Achievement: 09/26/12 Potential to Achieve Goals: Good Pt will go Sit to Stand: with modified independence PT Goal: Sit to Stand - Progress: Goal set today Pt will go Stand to Sit: with modified independence PT Goal: Stand to Sit - Progress: Goal set today Pt will Transfer Bed to Chair/Chair to Bed: with modified independence PT Transfer Goal: Bed to Chair/Chair to Bed - Progress: Goal set today Pt will Ambulate: 51 - 150 feet;with modified independence;with least restrictive assistive device PT Goal: Ambulate - Progress: Goal set today Pt will Go Up / Down Stairs: 3-5 stairs;with supervision;with rail(s) PT Goal: Up/Down Stairs - Progress: Goal set today Pt will Propel Wheelchair: > 150 feet;with modified independence PT Goal: Propel Wheelchair - Progress: Goal set today  Visit Information  Last PT Received On: 09/19/12 Assistance Needed: +1    Subjective Data  Patient Stated Goal: to decrease foot pain   Prior Functioning  Home Living Lives With: Spouse Available Help at Discharge: Family (works during the day) Type of Home: House Home Access: Stairs to enter [employer] of  Steps: 6 Entrance Stairs-Rails: Right;Left;Can reach  both Home Layout: One level Bathroom Shower/Tub: Tub/shower unit;Curt[employer] Biochemist, clinical: Standard Bathroom Accessibility: Yes How Accessible: Accessible via walker Home Adaptive Equipment: None Prior Function Level of Independence: Independent Able to Take Stairs?: Yes Driving: Yes Vocation: Full time 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: No difficulties Dominant Hand: Right    Cognition  Overall Cognitive Status: Appears within functional limits for tasks assessed/performed Arousal/Alertness: Awake/alert Orientation Level: Appears intact for tasks assessed Behavior During Session: [facility] for tasks performed    Extremity/Trunk Assessment Right Lower Extremity Assessment RLE ROM/Strength/Tone: Deficits RLE ROM/Strength/Tone Deficits: Hip and Knee WFL; UTA ankle secondary to pain  RLE Sensation: WFL - Light Touch Left Lower Extremity Assessment LLE ROM/Strength/Tone: Deficits;Unable to fully assess LLE ROM/Strength/Tone Deficits: Hip and Knee WFL; unable to assess ankle LLE Sensation: WFL - Light Touch   Balance    End of Session PT - End of Session Equipment Utilized During Treatment: Gait belt;Other (comment) (post op shoe) Activity Tolerance: Patient tolerated treatment well Patient left: in bed;with call bell/phone within reach Nurse Communication: Mobility status  GP     Ambrose Finland 09/19/2012, 11:06 AM

## 2012-09-19 NOTE — Progress Notes (Signed)
ANTICOAGULATION/ANTIBIOTIC CONSULT NOTE - [place] for Coumadin and Vancomycin, Zosyn Indication: VTE prophylaxis and Cellulitis  Allergies  Allergen Reactions  . Latex Rash    Pt states he is allergic to latex condoms    Patient Measurements: Height: 5\' 11"  (180.3 cm) Weight: 206 lb (93.441 kg) IBW/kg (Calculated) : 75.3   Vital Signs: Temp: 98 F (36.7 C) (12/14 0558) Temp src: Oral (12/14 0558) BP: 160/86 mmHg (12/14 0558)  Labs:  Basename 09/19/12 0600 09/17/12 0615 09/16/12 1804  HGB -- 13.5 14.0  HCT -- 36.8* 38.5*  PLT -- 233 222  APTT -- -- --  LABPROT 14.2 -- --  INR 1.11 -- --  HEPARINUNFRC -- -- --  CREATININE -- 0.67 0.95  CKTOTAL -- -- --  CKMB -- -- --  TROPONINI -- -- --    Estimated Creatinine Clearance: 137.5 ml/min (by C-G formula based on Cr of 0.67).  Assessment: Infectious Disease: Vanc/Zosyn D#3 (total abx D#7) for b/l foot cellulitis. MRI no osteo - s/p I&D 12/13. Afeb. Wbc wnl. Plan: Continue Zosyn 3.375gm IV q8h and Vanc 1gm IV q8h. Trough at Css if continues. Note done 12/14.  Vancomycin 12/8->12/9; 12/11>> Zosyn 12/9; 12/11>> Clinda 12/9>>12/11  12/12 Wound cx >>gram stain >> rare GPC 12/13 Wound cx >>gram stain >> rare GPC 12/9 Urine>>NEG 09/13/2012 Bld x 2 >> ngtd  Anticoagulation: Coumadin for VTE prophylaxis s/p I&D of bilateral feet. INR 1.11 past first dose given. No bleeding noted. Creat stable - good UOP.  Goal of Therapy:  INR 2-3 Monitor platelets by anticoagulation protocol: Yes Vancomycin trough 10-15 mcg/ml   Plan:  1) Continue Vancomycin 1mg  IV [id number]. Will check level in next couple of days if continues 2) Continue Zosyn 3.375gm IV q8h. 3) F/u cultures 4) coumadin 10mg  again tonight.  5) Daily PT/INR  Sherlon Handing, PharmD, BCPS Clinical pharmacist, pager [phone] 09/19/2012,12:54 PM

## 2012-09-20 LAB — CULTURE, BLOOD (ROUTINE X 2)

## 2012-09-20 LAB — PROTIME-INR
INR: 1.31 (ref 0.00–1.49)
Prothrombin Time: 16 seconds — ABNORMAL HIGH (ref 11.6–15.2)

## 2012-09-20 MED ORDER — WARFARIN SODIUM 10 MG PO TABS
10.0000 mg | ORAL_TABLET | Freq: Once | ORAL | Status: AC
  Administered 2012-09-20: 10 mg via ORAL
  Filled 2012-09-20: qty 1

## 2012-09-20 NOTE — Progress Notes (Signed)
ANTICOAGULATION/ANTIBIOTIC CONSULT NOTE - [place] for Coumadin Indication: VTE prophylaxis  Allergies  Allergen Reactions  . Latex Rash    Pt states he is allergic to latex condoms    Patient Measurements: Height: 5\' 11"  (180.3 cm) Weight: 206 lb (93.441 kg) IBW/kg (Calculated) : 75.3   Vital Signs: Temp: 98.2 F (36.8 C) (12/15 0531) Temp src: Oral (12/15 0531) BP: 132/71 mmHg (12/15 0531) Pulse Rate: 85  (12/15 0531)  Labs:  Basename 09/20/12 0730 09/19/12 0600  HGB -- --  HCT -- --  PLT -- --  APTT -- --  LABPROT 16.0* 14.2  INR 1.31 1.11  HEPARINUNFRC -- --  CREATININE -- --  CKTOTAL -- --  CKMB -- --  TROPONINI -- --    Estimated Creatinine Clearance: 137.5 ml/min (by C-G formula based on Cr of 0.67).  Assessment: Coumadin for VTE prophylaxis s/p I&D of bilateral feet. INR 1.31 - moving nicely on 10mg  daily. No bleeding noted.   Goal of Therapy:  INR 2-3 Monitor platelets by anticoagulation protocol: Yes   Plan:  1) Coumadin 10mg  again tonight.  2) Daily PT/INR 3) Consider de-escalate antibiotics  Sherlon Handing, PharmD, BCPS Clinical pharmacist, pager [phone] 09/20/2012,10:20 AM

## 2012-09-20 NOTE — Progress Notes (Signed)
Physical Therapy Treatment Patient Details Name: [name] MRN: [id number] DOB: Feb 22, 1966 Today's Date: 09/20/2012 Time: [id number] PT Time Calculation (min): 16 min  PT Assessment / Plan / Recommendation Comments on Treatment Session  Pt moving well this session. Pt still requiring cueing thruoghout to avoid weightbearing through LLE, mostly during sit/stand. Pt completed stairs with min assist for stability. Continue therapy prior to d/c home for safety    Follow Up Recommendations  No PT follow up;Supervision for mobility/OOB     Does the patient have the potential to tolerate intense rehabilitation     Barriers to Discharge        Equipment Recommendations  Rolling walker with 5" wheels;Wheelchair (measurements PT) (elevating leg rests)    Recommendations for Other Services    Frequency Min 4X/week   Plan Discharge plan remains appropriate;Frequency remains appropriate    Precautions / Restrictions Precautions Precautions: None Restrictions Weight Bearing Restrictions: Yes RLE Weight Bearing: Weight bearing as tolerated LLE Weight Bearing: Non weight bearing   Pertinent Vitals/Pain No complaints of pain    Mobility  Bed Mobility Bed Mobility: Not assessed Transfers Transfers: Sit to Stand;Stand to Sit Sit to Stand: 5: Supervision;With upper extremity assist;From chair/3-in-1 Stand to Sit: 5: Supervision;With upper extremity assist;To chair/3-in-1 Details for Transfer Assistance: Cues to avoid weightbearing through LLE when standing Ambulation/Gait Ambulation/Gait Assistance: 5: Supervision Ambulation Distance (Feet): 150 Feet Assistive device: Rolling walker Ambulation/Gait Assistance Details: Supervision for safety as pt impulsive and moving quickly.  Gait Pattern: Step-to pattern;Decreased step length - left Gait velocity: slow gait speed Stairs: Yes Stairs Assistance: 4: Min assist Stairs Assistance Details (indicate cue type and reason): Min assist for  stability. Educated pt on proper sequencing and safety on stairs backwards with RW. Completed 3 times for reinforcement Stair Management Technique: No rails;Step to pattern;Backwards;With walker Number of Stairs: 2     Exercises     PT Diagnosis:    PT Problem List:   PT Treatment Interventions:     PT Goals Acute Rehab PT Goals PT Goal: Sit to Stand - Progress: Progressing toward goal PT Goal: Stand to Sit - Progress: Progressing toward goal PT Transfer Goal: Bed to Chair/Chair to Bed - Progress: Progressing toward goal PT Goal: Ambulate - Progress: Progressing toward goal PT Goal: Up/Down Stairs - Progress: Progressing toward goal PT Goal: Propel Wheelchair - Progress: Progressing toward goal  Visit Information  Last PT Received On: 09/20/12 Assistance Needed: +1    Subjective Data      Cognition  Overall Cognitive Status: Appears within functional limits for tasks assessed/performed Arousal/Alertness: Awake/alert Orientation Level: Appears intact for tasks assessed Behavior During Session: [facility] for tasks performed    Balance     End of Session PT - End of Session Equipment Utilized During Treatment: Gait belt;Other (comment) (bilateral post op shoes) Activity Tolerance: Patient tolerated treatment well Patient left: in chair;with call bell/phone within reach Nurse Communication: Mobility status   GP     Ambrose Finland 09/20/2012, 11:45 AM

## 2012-09-20 NOTE — Progress Notes (Signed)
Family Medicine Teaching Service Attending Note  I interviewed and examined patient [name] and reviewed their tests and x-rays.  I discussed with Dr. Zigmund Daniel and reviewed their note for today.  I agree with their assessment and plan.     Additionally  Feels steadily better Continue treatment as per surgery

## 2012-09-20 NOTE — Progress Notes (Signed)
Family Medicine Teaching Service Daily Progress Note Service Page: [phone]  Patient Assessment: 46 yo M with DM recently admitted 12/8-12/9 for RIGHT foot cellulitis and abscess now with LEFT foot cellulitis and abscess, having failed an outpatient course of clindamycin.  Subjective: Reports continued pain in L foot.  Right foot "feels fine".  Appetite ok.  No additional complaints this morning.   Objective: Temp:  [98 F (36.7 C)-98.8 F (37.1 C)] 98.2 F (36.8 C) (12/15 0531) Pulse Rate:  [82-85] 85  (12/15 0531) Resp:  [18] 18  (12/15 0531) BP: (132-156)/(71-81) 132/71 mmHg (12/15 0531) SpO2:  [97 %-99 %] 99 % (12/15 0531) Exam: General: NAD, pleasant, cooperative Cardiovascular: RRR Respiratory: NWOB, CTAB Abd: soft, nontender Neuro: nonfocal, speech intact Extremities: bilateral feet wrapped in bandages, did not unwrap. Calves nontender to palpation.  I have reviewed the patient's medications, labs, imaging, and diagnostic testing.  Notable results are summarized below.  Medications  Scheduled Medications     . insulin aspart  0-15 Units Subcutaneous TID WC  . insulin glargine  5 Units Subcutaneous QHS  . lisinopril  20 mg Oral QPM  . oxyCODONE  10 mg Oral Q6H  . piperacillin-tazobactam (ZOSYN)  IV  3.375 g Intravenous Q8H  . senna  1 tablet Oral BID  . vancomycin  1,000 mg Intravenous Q8H  . Warfarin - Pharmacist Dosing Inpatient   Does not apply q1800  PRN Medications acetaminophen, acetaminophen, metoCLOPramide (REGLAN) injection, metoCLOPramide, morphine injection, ondansetron (ZOFRAN) IV, ondansetron   IVF: NS @ 125 cc/hr  Labs:  Foot cx reincubated for better growth Wound cx no growth to date MRSA nasal screen negative  Imaging/Diagnostic Tests:  MRI L foot 09/17/12: 1. Large peripherally enhancing fluid collection dorsally at the first webspace and extending into the second digit consistent with a soft tissue abscess.  2. Extensive surrounding  cellulitis with extension into the first and second toes and intraosseous myositis. Suggested soft tissue ulcer plantar to the first metatarsal head.  3. No evidence of osteomyelitis.  Assessment/Plan:  46 yo M with DM recently admitted 12/8-12/9 for RIGHT foot cellulitis and abscess now with LEFT foot cellulitis and abscess, taken to OR for debridement of bilateral feet tissue on 09/18/12  # BILATERAL foot/toe cellulitis and abscess:  -likely meeting Sepsis criteria upon admission, but vitals have normalized since that time -POD #2 s/p debridement in OR -continue IV Vanc/Zosyn -f/u wound cx - reincubated for better growth -Po pain medications with morphine for breakthrough pain.  -Appreciate ortho assistance  # Hypertension:  -relatively hypotensive upon admission, since resolved and now with some higher BP's  -Lisinopril increased to 20mg , pressure improved.   # DM: poorly controlled based on A1c of 8.8 on 12/6  -CBG in last 24 hours: 125-183 -Holding home metformin  -Lantus 5U + SSI   # FEN/GI:  -carb modified diet -KVO IV  # Ppx:  -coumadin  # Dispo:  -d/c pending any additional recommendations from [place], DO PGY-3

## 2012-09-21 ENCOUNTER — Encounter (HOSPITAL_COMMUNITY): Payer: Self-pay | Admitting: Orthopedic Surgery

## 2012-09-21 LAB — GLUCOSE, CAPILLARY
Glucose-Capillary: 311 mg/dL — ABNORMAL HIGH (ref 70–99)
Glucose-Capillary: 209 mg/dL — ABNORMAL HIGH (ref 70–99)

## 2012-09-21 LAB — WOUND CULTURE

## 2012-09-21 LAB — BASIC METABOLIC PANEL
BUN: 21 mg/dL (ref 6–23)
CO2: 32 mEq/L (ref 19–32)
Chloride: 99 mEq/L (ref 96–112)
Creatinine, Ser: 1.21 mg/dL (ref 0.50–1.35)

## 2012-09-21 LAB — CBC
HCT: 37.4 % — ABNORMAL LOW (ref 39.0–52.0)
MCH: 31.9 pg (ref 26.0–34.0)
MCHC: 35.8 g/dL (ref 30.0–36.0)
MCV: 89 fL (ref 78.0–100.0)
RDW: 12 % (ref 11.5–15.5)

## 2012-09-21 MED ORDER — CIPROFLOXACIN HCL 500 MG PO TABS: 500.0000 mg | ORAL_TABLET | Freq: Two times a day (BID) | ORAL | Status: DC

## 2012-09-21 MED ORDER — LISINOPRIL 10 MG PO TABS: 10.0000 mg | ORAL_TABLET | Freq: Every day | ORAL | Status: DC

## 2012-09-21 MED ORDER — DOXYCYCLINE HYCLATE 100 MG PO TABS: 100.0000 mg | ORAL_TABLET | Freq: Two times a day (BID) | ORAL | Status: DC

## 2012-09-21 MED ORDER — LISINOPRIL 20 MG PO TABS: 20.0000 mg | ORAL_TABLET | Freq: Every evening | ORAL | Status: DC

## 2012-09-21 MED ORDER — DOXYCYCLINE HYCLATE 100 MG PO TABS
100.0000 mg | ORAL_TABLET | Freq: Two times a day (BID) | ORAL | Status: DC
  Administered 2012-09-21: 100 mg via ORAL
  Filled 2012-09-21 (×2): qty 1

## 2012-09-21 MED ORDER — CIPROFLOXACIN HCL 500 MG PO TABS
500.0000 mg | ORAL_TABLET | Freq: Two times a day (BID) | ORAL | Status: DC
  Administered 2012-09-21: 500 mg via ORAL
  Filled 2012-09-21 (×3): qty 1

## 2012-09-21 NOTE — Progress Notes (Signed)
FMTS Attending Daily Note:  Annabell Sabal MD  [phone] pager  Family Practice pager:  [phone] I have seen and examined this patient and have reviewed their chart. I have discussed this patient with the resident. I agree with the resident's findings, assessment and care plan.  Plan for DC home today.  Switching to PO antibiotics.  Slight elevation in creatinine, but within expected range for having recently increased his ACE-I.  Recommend repeating BMET in next 2-3 days at follow-up.

## 2012-09-21 NOTE — Progress Notes (Signed)
Patient ID: Rhylen Kimmes, male   DOB: 02-28-1966, 46 y.o.   MRN: [id number] Dressings clean dry and intact. Okay for discharge to home on by mouth antibiotics today. Patient will minimize his weight-bearing. Will keep dressings clean dry and intact. Her dressings become soiled he may wash with soap and water apply antibiotic ointment and redress both feet. I will followup in the office next Monday or Tuesday.

## 2012-09-21 NOTE — Progress Notes (Signed)
Family Medicine Teaching Service Daily Progress Note Service Page: [phone]  Patient Assessment: 46 yo M with DM recently admitted 12/8-12/9 for RIGHT foot cellulitis and abscess now with LEFT foot cellulitis and abscess, having failed an outpatient course of clindamycin.  Subjective: Reports pain is well controlled. Feels well and is ready to go home.  Objective: Temp:  [97.9 F (36.6 C)-98.5 F (36.9 C)] 97.9 F (36.6 C) (12/16 0523) Pulse Rate:  [77-102] 77  (12/16 0523) Resp:  [18-20] 20  (12/16 0523) BP: (111-165)/(66-88) 148/88 mmHg (12/16 0523) SpO2:  [98 %-100 %] 98 % (12/16 0523) Exam: General: NAD, pleasant, cooperative, bright affect Cardiovascular: RRR Respiratory: NWOB, CTAB Abd: soft, nontender Neuro: nonfocal, speech intact Extremities: bilateral feet wrapped in bandages, did not unwrap.  I have reviewed the patient's medications, labs, imaging, and diagnostic testing.  Notable results are summarized below.  Medications  Scheduled Medications     . insulin aspart  0-15 Units Subcutaneous TID WC  . insulin glargine  5 Units Subcutaneous QHS  . lisinopril  20 mg Oral QPM  . oxyCODONE  10 mg Oral Q6H  . piperacillin-tazobactam (ZOSYN)  IV  3.375 g Intravenous Q8H  . senna  1 tablet Oral BID  . vancomycin  1,000 mg Intravenous Q8H  . Warfarin - Pharmacist Dosing Inpatient   Does not apply q1800  PRN Medications acetaminophen, acetaminophen, metoCLOPramide (REGLAN) injection, metoCLOPramide, morphine injection, ondansetron (ZOFRAN) IV, ondansetron   IVF: NS @ KVO  Labs:  Foot cx reincubated for better growth Wound cx grew GBS Anaerobic cx NGTD MRSA nasal screen negative INR 1.78  Initial urine/blood cx x2 from prior admission no growth final   Lab 09/21/12 0658 09/17/12 0615 09/16/12 1804  NA 137 139 136  K 4.2 4.3 3.9  CL 99 102 98  CO2 32 32 29  BUN 21 12 14   CREATININE 1.21 0.67 0.95  LABGLOM -- -- --  GLUCOSE 247* -- --  CALCIUM 9.2  9.1 9.2    Lab 09/21/12 0658 09/17/12 0615 09/16/12 1804  WBC 7.7 9.3 11.6*  HGB 13.4 13.5 14.0  HCT 37.4* 36.8* 38.5*  PLT 267 233 222     Imaging/Diagnostic Tests:  MRI L foot 09/17/12: 1. Large peripherally enhancing fluid collection dorsally at the first webspace and extending into the second digit consistent with a soft tissue abscess.  2. Extensive surrounding cellulitis with extension into the first and second toes and intraosseous myositis. Suggested soft tissue ulcer plantar to the first metatarsal head.  3. No evidence of osteomyelitis.  Assessment/Plan:  46 yo M with DM recently admitted 12/8-12/9 for RIGHT foot cellulitis and abscess now with LEFT foot cellulitis and abscess, taken to OR for debridement of bilateral feet tissue on 09/18/12  # BILATERAL foot/toe cellulitis and abscess:  -likely meeting Sepsis criteria upon admission, but vitals have normalized since that time -POD #3 s/p debridement in OR -per ortho, ready for discharge on PO antibiotics, appreciate ortho assistance -only species identified thus far is GBS -will switch vanc/zosyn to doxycycline & cipro to cover for MRSA & pseudomonas, respectively -pain control @ home with percocet (has pills already at home)  # Hypertension:  -relatively hypotensive upon admission, since resolved and now with some higher BP's  -Lisinopril increased to 20mg  with better pressures, but now creatinine bumped to 1.27  -will decrease back down to 10mg  and recommend f/u as outpatient  # Acute kidney injury: -possibly secondary to vancomycin or increase in lisinopril dose -d/c vancomycin  today, and decrease lisinopril back to 10mg  -recheck at f/u appt after d/c  # DM: poorly controlled based on A1c of 8.8 on 12/6  -CBG in last 24 hours: 176-311 -Holding home metformin  -Lantus 5U + SSI  -will d/c on home lantus regimen. Continue holding metformin in setting of AKI.  # FEN/GI:  -carb modified diet -KVO IV  # Ppx:   -has received coumadin while in hospital, but will d/c this upon discharge and encourage patient to actively move extremities to prevent DVT  # Dispo:  -d/c today on oral antibiotics, with close f/u for recheck of creatinine   Chrisandra Netters, MD [facility] Medicine PGY-1

## 2012-09-21 NOTE — Progress Notes (Addendum)
D/c instructions reviewed with pt by Judd Lien, copy of instructions given to pt. Pt will d/c with belongings with friend and will be d/c'd via a wheelchair, escorted by hospital volunteer.

## 2012-09-23 ENCOUNTER — Ambulatory Visit (INDEPENDENT_AMBULATORY_CARE_PROVIDER_SITE_OTHER): Payer: Medicaid Other | Admitting: Family Medicine

## 2012-09-23 ENCOUNTER — Encounter: Payer: Self-pay | Admitting: Family Medicine

## 2012-09-23 VITALS — BP 159/90 | HR 98 | Temp 98.4°F | Ht 71.0 in | Wt 204.0 lb

## 2012-09-23 DIAGNOSIS — L03119 Cellulitis of unspecified part of limb: Secondary | ICD-10-CM

## 2012-09-23 DIAGNOSIS — L03115 Cellulitis of right lower limb: Secondary | ICD-10-CM

## 2012-09-23 DIAGNOSIS — L03116 Cellulitis of left lower limb: Secondary | ICD-10-CM

## 2012-09-23 DIAGNOSIS — I10 Essential (primary) hypertension: Secondary | ICD-10-CM

## 2012-09-23 DIAGNOSIS — L02619 Cutaneous abscess of unspecified foot: Secondary | ICD-10-CM

## 2012-09-23 NOTE — Patient Instructions (Signed)
I'm glad that things are going better.  Make sure you keep taking your antibiotics.  Dr. Jess Barters may want to increase your blood pressure and/or diabetes medicine in a few weeks when you see him, but I"m going to wait to make any changes. We will recheck your kidneys at your follow up with him in 2 weeks.

## 2012-09-23 NOTE — Assessment & Plan Note (Signed)
Improving! S/p OR with Dr. Sharol Given.  Continue doxy + cipro for now. F/u 2 weeks with PCP

## 2012-09-23 NOTE — Progress Notes (Signed)
S: Pt comes in today for hospital follow up.  Patient was admitted for left foot abscess and cellulitis a few days after being d/c'ed for right foot abscess and cellulitis.  During this hospitalization, Dr. Sharol Given took him to the OR and did debridements of both feet.  He was sent home on doxy + cipro which he is tolerating well.  He reports his feet are significantly improved.  His left foot is still having some bleeding, but no purulent discharge and no surrounding redness.  Still with pain, that is only mildly improved with percocet-- but this pain is only when he is standing on his feet, which he is not supposed to be doing.  He changed his dressings today after being OK'ed by Dr. Jess Barters office.  He has f/u with Sharol Given early next week.   During hospitalization, had mild AKI when lisinopril was increased.  Discussed with pt that I would like to recheck a BMET today but pt would rather do it at his next follow up instead.  Since d/c Cr was 1.21, I agreed that we could wait.     ROS: Per HPI  History  Smoking status  . Former Smoker  . Types: Cigarettes  . Quit date: 09/03/2012  Smokeless tobacco  . Former Systems developer  . Quit date: 09/03/2012    O:  Filed Vitals:   09/23/12 1631  BP: 159/90  Pulse: 98  Temp: 98.4 F (36.9 C)    Gen: NAD Ext: Warm, R great toe covered with clean dressing, no surrounding erythema or tenderness other than over I&D site; L foot wrapped in ACE wrap which pt did not want removed- but able to see some serosanguinous d/c from I&D site between 1st and 2nd toes without purulence; pt reports no skin erythema on L foot   A/P: 46 y.o. male p/w HFU for bilateral foot abscess and cellulitis, finally improving -See problem list -f/u in 2 weeks with PCP, needs BMET

## 2012-09-23 NOTE — Assessment & Plan Note (Signed)
Improving.

## 2012-09-23 NOTE — Assessment & Plan Note (Signed)
Elevated today, only on lisinopril 10.  Pt would like to defer BMET until f/u in 2 weeks so will wait to increase to 20mg  at this time.

## 2012-09-23 NOTE — Discharge Summary (Signed)
Family Medicine Teaching Service  Discharge Note : Attending Annabell Sabal MD Pager [phone] Inpatient Team Pager:  [phone]  I have seen and examined this patient, reviewed their chart and discussed discharge planning with the resident at the time of discharge. I agree with the discharge plan as above.

## 2012-09-23 NOTE — Discharge Summary (Signed)
[facility] Discharge Summary  Patient name: [name] Medical record number: [id number] Date of birth: January 23, 1966 Age: 46 y.o. Gender: male Date of Admission: 09/16/2012  Date of Discharge: 09/21/12 Admitting Physician: Dickie La, MD  Primary Care Provider: Vickie Epley, MD  Indication for Hospitalization: left foot cellulitis  Discharge Diagnoses:  1. Bilateral foot cellulitis with abscess 2. Hypertension 3. Type II diabetes mellitus, uncontrolled 4. Acute kidney injury  Brief Hospital Course:  [name] is a 46 y.o. male who was admitted to the hospital directly from the [facility] on 09/16/12 with a chief complaint of LEFT foot cellulitis and abscess, after recently being discharged on an oral regimen of clindamycin for RIGHT foot cellulitis.   # Bilateral feet cellulitis: An MRI of the left foot was obtained, which showed an abscess in the first webspace extending into the second digit, with surrounding cellulitis. Patient put on IV vancomycin/zosyn. Dr. Sharol Given of orthopedic surgery was consulted, and on 12/13, took patient to the OR for debridement of bilateral feet abscesses. Patient had an uneventful post-operative course. He was discharged on oral doxycycline and ciprofloxacin, for a total of 14 days of antibiotics. Prior to discharge a home walker and wheelchair were ordered, per PT recommendations. PT did not recommend any further followup.  # Hypertension: Pt was initially continued on his home dose of lisinopril 10mg , but was noted to have elevated blood pressures so this was increased to 20mg  daily. Creatinine bumped on day of discharge, so this was decreased back down to 10mg  at discharge.  # Acute Kidney Injury: On the day of discharge, creatinine was noted to be 1.21, up from a baseline of 0.7-0.9. This was attributed either to vancomycin or due to increase in home lisinopril dose from 10mg  to 20mg . Vancomycin was discontinued in favor of an oral  agent, and lisinopril was decreased back down to 10mg . Creatinine will need to be rechecked as an outpatient at follow up appt.  # Diabetes: Patient was given Lantus and sliding scale insulin while hospitalized.  Consultations: Orthopedics (Dr. Sharol Given)  Procedures: 09/18/12: Excisional debridement bilateral feet of skin soft tissue muscle and tendon. Local tissue rearrangement for necrotic wound right foot 5 cm x 3 cm.  Significant Labs and Imaging:  Foot cx reincubated for better growth  Wound cx grew GBS  Anaerobic cx NGTD  MRSA nasal screen negative  INR 1.78  Initial urine/blood cx x2 from prior admission no growth final  MRI L foot 09/17/12:  1. Large peripherally enhancing fluid collection dorsally at the first webspace and extending into the second digit consistent with a soft tissue abscess.  2. Extensive surrounding cellulitis with extension into the first and second toes and intraosseous myositis. Suggested soft tissue ulcer plantar to the first metatarsal head.  3. No evidence of osteomyelitis.   Discharge Medications:    Medication List     As of 09/23/2012  4:16 AM    STOP taking these medications         clindamycin 150 MG capsule   Commonly known as: CLEOCIN      metFORMIN 500 MG tablet   Commonly known as: GLUCOPHAGE      mupirocin ointment 2 %   Commonly known as: BACTROBAN      TAKE these medications         ciprofloxacin 500 MG tablet   Commonly known as: CIPRO   Take 1 tablet (500 mg total) by mouth 2 (two) times daily.  doxycycline 100 MG tablet   Commonly known as: VIBRA-TABS   Take 1 tablet (100 mg total) by mouth every 12 (twelve) hours.      insulin glargine 100 UNIT/ML injection   Commonly known as: LANTUS   Inject 6 Units into the skin at bedtime.      lisinopril 10 MG tablet   Commonly known as: PRINIVIL,ZESTRIL   Take 1 tablet (10 mg total) by mouth daily.      oxyCODONE-acetaminophen 5-325 MG per tablet   Commonly known as:  PERCOCET/ROXICET   Take 1 tablet by mouth every 4 (four) hours as needed for pain.        Issues for Follow Up:  -Patient will need f/u with [facility] and ortho to ensure continued clinical improvement of bilateral feet cellulitis/abscess -On day of d/c, creatinine noted to be elevated to 1.21, which was above baseline of 0.7-0.9. Recommend repeating BMET at [facility] f/u appt on 12/18 to ensure creatinine stabilized. Home metformin will need to be restarted once creatinine stabilized, as this was held at discharge. -BPs elevated while in hospital. We increased home lisinopril from 10 to 20mg  daily, but creatinine bumped (as above), so lowered lisinopril back to 10mg  daily. Would recommend titration or addition of another antihypertensive agent as indicated in outpatient setting. -After surgery by orthopedics, patient was put on coumadin for DVT prophylaxis. We elected not to continue this at discharge, and encouraged pt to move legs to prevent blood clots. Would recommend monitoring for any signs or symptoms of DVT at f/u.  Outstanding Results: intraoperative wound culture from 12/13 (reincubated for better growth at time of d/c), anaerobic culture (NGTD at time of d/c)      Follow-up Information    Follow up with [name],MARCUS V, MD. Schedule an appointment as soon as possible for a visit in 1 week.   Contact information:   [place] [id number] [phone]       Follow up with [facility] [address], MD. On 09/23/2012. (at 4:15pm)    Contact information:   [place] [address] [phone]          Discharge Condition: stable  Discharge Instructions: Please refer to Patient Instructions section of EMR for full details.  Patient was counseled important signs and symptoms that should prompt return to medical care, changes in medications, dietary instructions, activity restrictions, and follow up appointments.    Chrisandra Netters, MD Family Medicine  PGY-1

## 2012-09-27 LAB — ANAEROBIC CULTURE

## 2012-09-28 ENCOUNTER — Telehealth: Payer: Self-pay | Admitting: Family Medicine

## 2012-09-28 MED ORDER — ONDANSETRON HCL 4 MG PO TABS: 4.0000 mg | ORAL_TABLET | Freq: Three times a day (TID) | ORAL | Status: DC | PRN

## 2012-09-28 NOTE — Telephone Encounter (Signed)
Patient is calling because the abx he is taking is making him nauseated and giving him reflux so he would like something for nausea to be sent to his pharmacy.  [facility] on [address].

## 2012-09-28 NOTE — Telephone Encounter (Signed)
FWD to Dr. Ardelia Mems she is the one who seen this patient and prescribed it

## 2012-09-28 NOTE — Telephone Encounter (Signed)
Sent in rx for Zofran to pharmacy. Please call pt to let him know this has been done. Thanks!

## 2012-09-28 NOTE — Telephone Encounter (Signed)
Spoke with patient and informed him of below 

## 2012-10-05 ENCOUNTER — Ambulatory Visit (INDEPENDENT_AMBULATORY_CARE_PROVIDER_SITE_OTHER): Payer: Medicaid Other | Admitting: Family Medicine

## 2012-10-05 ENCOUNTER — Encounter: Payer: Self-pay | Admitting: Family Medicine

## 2012-10-05 VITALS — BP 164/103 | HR 103 | Temp 99.9°F | Ht 71.0 in | Wt 198.2 lb

## 2012-10-05 DIAGNOSIS — R112 Nausea with vomiting, unspecified: Secondary | ICD-10-CM

## 2012-10-05 DIAGNOSIS — R197 Diarrhea, unspecified: Secondary | ICD-10-CM

## 2012-10-05 LAB — CBC
MCH: 31.4 pg (ref 26.0–34.0)
MCV: 88.1 fL (ref 78.0–100.0)
Platelets: 225 10*3/uL (ref 150–400)
RDW: 12 % (ref 11.5–15.5)
WBC: 11.9 10*3/uL — ABNORMAL HIGH (ref 4.0–10.5)

## 2012-10-05 LAB — LIPASE: Lipase: 42 U/L (ref 11–59)

## 2012-10-05 LAB — COMPREHENSIVE METABOLIC PANEL
ALT: 23 U/L (ref 0–53)
Albumin: 3.5 g/dL (ref 3.5–5.2)
CO2: 30 mEq/L (ref 19–32)
Potassium: 4.1 mEq/L (ref 3.5–5.3)
Sodium: 134 mEq/L — ABNORMAL LOW (ref 135–145)
Total Bilirubin: 0.8 mg/dL (ref 0.3–1.2)
Total Protein: 7.9 g/dL (ref 6.0–8.3)

## 2012-10-05 MED ORDER — METOCLOPRAMIDE HCL 5 MG PO TABS: 5.0000 mg | ORAL_TABLET | Freq: Four times a day (QID) | ORAL | Status: DC

## 2012-10-05 MED ORDER — OMEPRAZOLE 40 MG PO CPDR: 40.0000 mg | DELAYED_RELEASE_CAPSULE | Freq: Every day | ORAL | Status: DC

## 2012-10-05 NOTE — Progress Notes (Signed)
Patient ID: [name], male   DOB: October 24, 1965, 46 y.o.   MRN: [id number] [name] is a 46 y.o. male who presents to [facility] today for nausea, vomiting, diarrhea:  1.  N/V/D:  Present for about 2 weeks, since he left hospital s/p incision and drainage of left foot abscess. Patient is a diabetic. He states he has had 2-3 episodes of non-bloody, non-bilious vomiting every since leaving the hospital. He is continuing to take his lisinopril through this.  He also states he has 2-3 episodes of loose bowel movements for about the same period of time. This became overt watery diarrhea yesterday. He denies any abdominal pain. He does have fevers for the past week or so. Describes this as mild in nature and alternating with chills. He has not taken his temperature.  He was prescribed Zofran about a week year but this has not helped with his nausea.  He is having trouble tolerating by mouth food but he is tolerating oral liquids. He drinks Gatorade and water. Ginger ale or Pepsi will also stay down. He is not had any chest pain or trouble breathing   The following portions of the patient's history were reviewed and updated as appropriate: allergies, current medications, past medical history, family and social history, and problem list.  Patient is a nonsmoker.  Past Medical History  Diagnosis Date  . Hypertension   . Diabetes mellitus     Type 2 DM  x 24 years  . Foreign body in left foot     with  infection    ROS as above otherwise neg. No Chest pain, palpitations, SOB, Fever, Chills, Abd pain, N/V/D.  Medications reviewed. Current Outpatient Prescriptions  Medication Sig Dispense Refill  . ciprofloxacin (CIPRO) 500 MG tablet Take 1 tablet (500 mg total) by mouth 2 (two) times daily.  19 tablet  0  . doxycycline (VIBRA-TABS) 100 MG tablet Take 1 tablet (100 mg total) by mouth every 12 (twelve) hours.  19 tablet  0  . insulin glargine (LANTUS) 100 UNIT/ML injection Inject 6 Units into the skin at  bedtime.      [name] lisinopril (PRINIVIL,ZESTRIL) 10 MG tablet Take 1 tablet (10 mg total) by mouth daily.      . ondansetron (ZOFRAN) 4 MG tablet Take 1 tablet (4 mg total) by mouth every 8 (eight) hours as needed for nausea.  10 tablet  0  . oxyCODONE-acetaminophen (ROXICET) 5-325 MG per tablet Take 1 tablet by mouth every 4 (four) hours as needed for pain.  20 tablet  0   No current facility-administered medications for this visit.   Facility-Administered Medications Ordered in Other Visits  Medication Dose Route Frequency Provider Last Rate Last Dose  . sodium chloride irrigation 0.9 %    PRN Newt Minion, MD   1,000 mL at 04/23/12 0740    Exam:  BP 164/103  Pulse 103  Temp 99.9 F (37.7 C) (Oral)  Ht 5\' 11"  (1.803 m)  Wt 198 lb 3.2 oz (89.903 kg)  BMI 27.64 kg/m2 Gen: Patient sitting on side of exam table, NAD, not ill appearing at first (did vomit at end of visit in trashcan) HEENT: EOMI,  MMM Lungs: CTABL Nl WOB Heart: Tachycardic, regular rhythm, Grade II/VI SEM noted.   Abd: Soft/nondistended.  Hyperactive bowel sounds.  RUQ tenderness, mild in nature, present.  Some minimal epigastric tenderness.  No lower quadrant tenderness, no guarding or rebound noted.  Exts: Non edematous BL  LE, warm and well  perfused.  Skin:  Wound on dorsal aspect of Left foot healing well.  Some serosanguinous drainage noted.   No results found for this or any previous visit (from the past 72 hour(s)).

## 2012-10-05 NOTE — Patient Instructions (Addendum)
We are going to check your blood levels today and let you know what they show.  The medicine I have sent in is called Metoclopramide.  Take this 30 minutes before each meal and before you go to bed at night.    Come back on Thursday so we can make sure you're getting better.  If you can't hold down any liquids, your stomach starts hurting, or you get worse come back to the ED.

## 2012-10-05 NOTE — Assessment & Plan Note (Addendum)
I am concerned about the time course of his symptoms.   Degree is not too severe as he only has about 2-3 episodes of vomiting a day and he is holding down liquids well.  Appears fairly well hydrated on examination today.  Elevation of creatinine to 1.2 from baseline around 0.5 - 0.6 noted prior to discharge, has not had FU BMET. Obtaining stat CMET today with lipase as he has RUQ tenderness. Appendicitis much less likely as not lower quadrant tenderness.   Gastroparesis a possibility in long-term diabetic (>20 years) but he has good sensation in feet BL.   Reglan to treat nausea.  Short term course to limit risk of extra pyramidal side effects. Prilosec due to reflux from vomiting.   Gastroenteritis also a possibility, although symptoms have been going on longer than would be expected.  Told him to hold ACE-I during this acute event to limit risk of AKI in light of dehydration from vomiting.   I provided the patient with explicit warnings and red flags that would prompt return to clinic or the ED, both verbally and written. FU with [place] on Thursday to assess for improvement, will call with lab results this evening.

## 2012-10-08 ENCOUNTER — Ambulatory Visit (INDEPENDENT_AMBULATORY_CARE_PROVIDER_SITE_OTHER): Payer: Medicaid Other | Admitting: Family Medicine

## 2012-10-08 ENCOUNTER — Encounter: Payer: Self-pay | Admitting: Family Medicine

## 2012-10-08 VITALS — BP 104/69 | HR 101 | Temp 98.1°F | Ht 71.0 in | Wt 198.0 lb

## 2012-10-08 DIAGNOSIS — L03119 Cellulitis of unspecified part of limb: Secondary | ICD-10-CM

## 2012-10-08 DIAGNOSIS — R112 Nausea with vomiting, unspecified: Secondary | ICD-10-CM

## 2012-10-08 DIAGNOSIS — N179 Acute kidney failure, unspecified: Secondary | ICD-10-CM

## 2012-10-08 DIAGNOSIS — L03116 Cellulitis of left lower limb: Secondary | ICD-10-CM

## 2012-10-08 DIAGNOSIS — R197 Diarrhea, unspecified: Secondary | ICD-10-CM

## 2012-10-08 DIAGNOSIS — L02619 Cutaneous abscess of unspecified foot: Secondary | ICD-10-CM

## 2012-10-08 MED ORDER — OXYCODONE-ACETAMINOPHEN 5-325 MG PO TABS: 1.0000 | ORAL_TABLET | Freq: Three times a day (TID) | ORAL | Status: DC | PRN

## 2012-10-08 NOTE — Patient Instructions (Signed)
Come back in about 2 weeks to see Dr. Jess Barters.    Come back next week for a lab check to make sure your kidney function is getting better.  Take the Percocet for pain relief.    I will let you know about the blood pressure cuff.

## 2012-10-08 NOTE — Progress Notes (Signed)
Patient ID: [name], male   DOB: 06/27/1966, 47 y.o.   MRN: [id number] [name] is a 47 y.o. male who presents to [facility] today for follow-up from nausea and vomiting earlier this week:  1.  N/V:  Completely resolved.  Took 1 of Omeprazole and one metoclopramide and patient's symptoms resolved at that time. Has not had any nausea vomiting for the past 2 days. No further epigastric pain. Malaise has also resolved. No fevers or chills.  Diarrhea has also resolved.   #2. Left foot cellulitis: Patient is followup appointment with orthopedic surgeon on Monday. He is keeping area clean and dry. He does continue to have some pain here. No purulent drainage. No redness.   The following portions of the patient's history were reviewed and updated as appropriate: allergies, current medications, past medical history, family and social history, and problem list.  Patient is a nonsmoker.  Past Medical History  Diagnosis Date  . Hypertension   . Diabetes mellitus     Type 2 DM  x 24 years  . Foreign body in left foot     with  infection    ROS as above otherwise neg. No Chest pain, palpitations, SOB, Fever, Chills, Abd pain, N/V/D.  Medications reviewed. Current Outpatient Prescriptions  Medication Sig Dispense Refill  . ciprofloxacin (CIPRO) 500 MG tablet Take 1 tablet (500 mg total) by mouth 2 (two) times daily.  19 tablet  0  . doxycycline (VIBRA-TABS) 100 MG tablet Take 1 tablet (100 mg total) by mouth every 12 (twelve) hours.  19 tablet  0  . insulin glargine (LANTUS) 100 UNIT/ML injection Inject 6 Units into the skin at bedtime.      [name] lisinopril (PRINIVIL,ZESTRIL) 10 MG tablet Take 1 tablet (10 mg total) by mouth daily.      . metoCLOPramide (REGLAN) 5 MG tablet Take 1 tablet (5 mg total) by mouth 4 (four) times daily.  90 tablet  1  . omeprazole (PRILOSEC) 40 MG capsule Take 1 capsule (40 mg total) by mouth daily.  30 capsule  3  . ondansetron (ZOFRAN) 4 MG tablet Take 1 tablet (4 mg total)  by mouth every 8 (eight) hours as needed for nausea.  10 tablet  0  . oxyCODONE-acetaminophen (ROXICET) 5-325 MG per tablet Take 1 tablet by mouth every 4 (four) hours as needed for pain.  20 tablet  0   No current facility-administered medications for this visit.   Facility-Administered Medications Ordered in Other Visits  Medication Dose Route Frequency Provider Last Rate Last Dose  . sodium chloride irrigation 0.9 %    PRN Newt Minion, MD   1,000 mL at 04/23/12 0740    Exam:  BP 104/69  Pulse 101  Temp 98.1 F (36.7 C) (Oral)  Ht 5\' 11"  (1.803 m)  Wt 198 lb (89.812 kg)  BMI 27.62 kg/m2 Gen: Well NAD HEENT: EOMI,  MMM Abd:  Soft/nondistended/nontender.  Good bowel sounds throughout all four quadrants.  No masses noted.  Exts: No edema or redness noted Wound:  Left foot with dorsal wound, healing slowly.  Granulation tissue present.  Some mild purulent drainage noted and removed when we removed bandage.  No surrounding erythema.    Results for orders placed in visit on 10/05/12 (from the past 72 hour(s))  COMPREHENSIVE METABOLIC PANEL     Status: Abnormal   Collection Time   10/05/12  3:46 PM      Component Value Range Comment   Sodium 134 (*)  135 - 145 mEq/L    Potassium 4.1  3.5 - 5.3 mEq/L    Chloride 93 (*) 96 - 112 mEq/L    CO2 30  19 - 32 mEq/L    Glucose, Bld 362 (*) 70 - 99 mg/dL    BUN 23  6 - 23 mg/dL    Creat 1.40 (*) 0.50 - 1.35 mg/dL    Total Bilirubin 0.8  0.3 - 1.2 mg/dL    Alkaline Phosphatase 124 (*) 39 - 117 U/L    AST 18  0 - 37 U/L    ALT 23  0 - 53 U/L    Total Protein 7.9  6.0 - 8.3 g/dL    Albumin 3.5  3.5 - 5.2 g/dL    Calcium 9.7  8.4 - 10.5 mg/dL   CBC     Status: Abnormal   Collection Time   10/05/12  3:46 PM      Component Value Range Comment   WBC 11.9 (*) 4.0 - 10.5 K/uL    RBC 4.97  4.22 - 5.81 MIL/uL    Hemoglobin 15.6  13.0 - 17.0 g/dL    HCT 43.8  39.0 - 52.0 %    MCV 88.1  78.0 - 100.0 fL    MCH 31.4  26.0 - 34.0 pg    MCHC  35.6  30.0 - 36.0 g/dL    RDW 12.0  11.5 - 15.5 %    Platelets 225  150 - 400 K/uL Platelet count confirmed on smear  LIPASE     Status: Normal   Collection Time   10/05/12  3:46 PM      Component Value Range Comment   Lipase 42  11 - 59 U/L

## 2012-10-09 ENCOUNTER — Telehealth: Payer: Self-pay | Admitting: Pharmacist

## 2012-10-09 ENCOUNTER — Ambulatory Visit: Payer: Medicaid Other | Admitting: Family Medicine

## 2012-10-09 DIAGNOSIS — N179 Acute kidney failure, unspecified: Secondary | ICD-10-CM | POA: Insufficient documentation

## 2012-10-09 NOTE — Assessment & Plan Note (Signed)
Creatinine elevated above baseline of 0.6 - 0.9.  Likely secondary to dehydration from N/V/D Needs FU BMET next week to ensure resolution and not continued upward trend.   Future lab order placed.

## 2012-10-09 NOTE — Telephone Encounter (Signed)
Error

## 2012-10-09 NOTE — Assessment & Plan Note (Signed)
Encouraged to keep appt with orthopedist on Monday. Healing well, albeit slowly. Oxycodone refilled provided for severe pain relief, Tylenol for mild to moderate pain relief.

## 2012-10-09 NOTE — Assessment & Plan Note (Signed)
Resolved.   To continue with Omeprazole. Metoclopramide if needed. FU if recurs or worsens.

## 2012-10-12 ENCOUNTER — Telehealth: Payer: Self-pay | Admitting: Family Medicine

## 2012-10-12 NOTE — Telephone Encounter (Signed)
Message copied by Alveda Reasons on Mon Oct 12, 2012  8:31 AM ------      Message from: Leavy Cella      Created: Fri Oct 09, 2012 12:31 PM       I am unaware of coverage for any insurer.  I am guessing that would be a company specific thing.Marland KitchenMarland KitchenAsk him to call his insurance company Dispensing optician and ask them directly.  I honestly doubt most will cover.                   ----- Message -----         From: Alveda Reasons, MD         Sent: 10/09/2012  10:29 AM           To: Alveda Reasons, MD, Leavy Cella, PHARMD            [facility],            This patient mentioned in passing that he would like to obtain a home blood pressure cuff to check his pressures.  Do you know if there are any that are covered by insurance?              Thanks,            Merry Proud

## 2012-10-15 ENCOUNTER — Telehealth: Payer: Self-pay | Admitting: Family Medicine

## 2012-10-15 ENCOUNTER — Other Ambulatory Visit: Payer: Medicaid Other

## 2012-10-15 DIAGNOSIS — N179 Acute kidney failure, unspecified: Secondary | ICD-10-CM

## 2012-10-15 LAB — BASIC METABOLIC PANEL
BUN: 20 mg/dL (ref 6–23)
Creat: 1.17 mg/dL (ref 0.50–1.35)
Potassium: 4.4 mEq/L (ref 3.5–5.3)

## 2012-10-15 NOTE — Progress Notes (Signed)
BMP DONE TODAY Lakeena Downie 

## 2012-10-15 NOTE — Telephone Encounter (Signed)
Received call from [facility] lab about critical glucose of 548.  Called patient who had not checked glucose today.  States that he feels "great".  Advised to check blood glucose and be sure to drink plenty of fluids. He states he knows how to give himself correction insulin for his sugar being that high.  If remains high he will follow up in the morning at our clinic.

## 2012-10-21 ENCOUNTER — Ambulatory Visit (INDEPENDENT_AMBULATORY_CARE_PROVIDER_SITE_OTHER): Payer: Medicaid Other | Admitting: Family Medicine

## 2012-10-21 ENCOUNTER — Encounter: Payer: Self-pay | Admitting: Family Medicine

## 2012-10-21 VITALS — BP 155/83 | HR 94 | Temp 98.3°F | Ht 71.0 in | Wt 201.0 lb

## 2012-10-21 DIAGNOSIS — IMO0001 Reserved for inherently not codable concepts without codable children: Secondary | ICD-10-CM

## 2012-10-21 DIAGNOSIS — I10 Essential (primary) hypertension: Secondary | ICD-10-CM

## 2012-10-21 DIAGNOSIS — IMO0002 Reserved for concepts with insufficient information to code with codable children: Secondary | ICD-10-CM

## 2012-10-21 DIAGNOSIS — E1165 Type 2 diabetes mellitus with hyperglycemia: Secondary | ICD-10-CM

## 2012-10-21 DIAGNOSIS — N179 Acute kidney failure, unspecified: Secondary | ICD-10-CM

## 2012-10-21 DIAGNOSIS — N529 Male erectile dysfunction, unspecified: Secondary | ICD-10-CM

## 2012-10-21 LAB — BASIC METABOLIC PANEL
Potassium: 4.3 mEq/L (ref 3.5–5.3)
Sodium: 134 mEq/L — ABNORMAL LOW (ref 135–145)

## 2012-10-21 MED ORDER — CLINDAMYCIN HCL 300 MG PO CAPS: 300.0000 mg | ORAL_CAPSULE | Freq: Three times a day (TID) | ORAL | Status: DC

## 2012-10-21 MED ORDER — INSULIN GLARGINE 100 UNIT/ML ~~LOC~~ SOLN: 10.0000 [IU] | Freq: Every day | SUBCUTANEOUS | Status: DC

## 2012-10-21 MED ORDER — METFORMIN HCL 500 MG PO TABS: 500.0000 mg | ORAL_TABLET | Freq: Two times a day (BID) | ORAL | Status: DC

## 2012-10-21 MED ORDER — SILDENAFIL CITRATE 100 MG PO TABS: 50.0000 mg | ORAL_TABLET | Freq: Every day | ORAL | Status: DC | PRN

## 2012-10-21 NOTE — Assessment & Plan Note (Signed)
Recheck basic metabolic panel today. Likely resolve.

## 2012-10-21 NOTE — Patient Instructions (Addendum)
Plan on coming back to see me the first week of February. Restart your Metformin and increase you Lantus to 10 units in the morning. We will recheck your kidney function and I will have one of my nurses call you tomorrow to let you know if it is OK to restart your lisinopril.

## 2012-10-21 NOTE — Assessment & Plan Note (Signed)
Appears to be organic in nature, not psychologic. Most likely due to long history of poorly controlled diabetes. Discussed with the patient treatment options including observation, medical treatment. Further, discussed with him that Viagra and Cialis are good options for him, and not covered by insurance. The patient would like a prescription for a small number of pills to try and understand that he will have to pay out of pocket.

## 2012-10-21 NOTE — Progress Notes (Signed)
Patient ID: [name], male   DOB: Sep 02, 1966, 47 y.o.   MRN: [id number] Subjective: The patient is a 47 y.o. year old male who presents today for followup.  #1. Nausea and vomiting: Completely resolved.  2. Hypertension: Patient was taken off of his lisinopril due to acute kidney injury. His blood pressure is elevated today but he denies any chest pain, shortness of breath, which will changes, or headaches.  3. Diabetes: Patient reports elevated blood sugars since being taken off of his metformin due to acute kidney injury. Morning blood sugars have been feeling in the mid 200s. He is having a low dose. He is taking 6 units of Lantus daily.  4. Erectile dysfunction: Patient reports that for the last 60 years he is a problems with obtaining and maintaining an erection. He reports decrease in both the strength and number of morning erections. This has been negatively affecting his relationship with his wife. There unable to have intercourse more than twice per month.  Patient's past medical, social, and family history were reviewed and updated as appropriate. History  Substance Use Topics  . Smoking status: Former Smoker    Types: Cigarettes    Quit date: 09/03/2012  . Smokeless tobacco: Former Systems developer    Quit date: 09/03/2012  . Alcohol Use: 4.8 oz/week    6 Cans of beer, 2 Shots of liquor per week     Comment: weekends.   Objective:  Filed Vitals:   10/21/12 0921  BP: 155/83  Pulse: 94  Temp: 98.3 F (36.8 C)   Gen: No acute distress CV: Regular rate and rhythm, no murmurs appreciated Resp: Clear to auscultation bilaterally Genital: Uncircumcised male. There are no masses on either testicle. There is no tenderness to palpation. There is no tenderness of the epididymis. There is slight left sided fullness of the spermatic cord consistent with varicocele Ext: Left foot is a well-healing wound on the dorsal aspect. Right great toe has a well-healing wound on the medial side. There is  no discharge. No pain with palpation. No erythema or warmth.  Assessment/Plan:  Please also see individual problems in problem list for problem-specific plans.

## 2012-10-21 NOTE — Assessment & Plan Note (Signed)
Start metformin an increased dose of Lantus. The patient follow up in 3 weeks and at that time we'll decide if we need at insulin.

## 2012-10-21 NOTE — Assessment & Plan Note (Signed)
Assuming that the patient's kidney function is stable today we will plan to restart lisinopril tomorrow. Follow up in 3 weeks.

## 2012-10-22 ENCOUNTER — Telehealth: Payer: Self-pay | Admitting: Family Medicine

## 2012-10-22 NOTE — Telephone Encounter (Signed)
Pt instructed to restart lisinopril as Cr is back to baseline.

## 2012-11-12 ENCOUNTER — Ambulatory Visit: Payer: Self-pay | Admitting: Family Medicine

## 2012-11-23 ENCOUNTER — Ambulatory Visit: Payer: Self-pay | Admitting: Family Medicine

## 2012-11-30 ENCOUNTER — Ambulatory Visit: Payer: Self-pay | Admitting: Family Medicine

## 2012-12-10 ENCOUNTER — Ambulatory Visit (INDEPENDENT_AMBULATORY_CARE_PROVIDER_SITE_OTHER): Payer: Medicaid Other | Admitting: Family Medicine

## 2012-12-10 VITALS — BP 143/89 | HR 88 | Temp 98.3°F | Ht 70.0 in | Wt 204.4 lb

## 2012-12-10 DIAGNOSIS — E11628 Type 2 diabetes mellitus with other skin complications: Secondary | ICD-10-CM

## 2012-12-10 DIAGNOSIS — IMO0001 Reserved for inherently not codable concepts without codable children: Secondary | ICD-10-CM

## 2012-12-10 DIAGNOSIS — Z720 Tobacco use: Secondary | ICD-10-CM

## 2012-12-10 DIAGNOSIS — F17200 Nicotine dependence, unspecified, uncomplicated: Secondary | ICD-10-CM

## 2012-12-10 DIAGNOSIS — E1165 Type 2 diabetes mellitus with hyperglycemia: Secondary | ICD-10-CM

## 2012-12-10 DIAGNOSIS — M25559 Pain in unspecified hip: Secondary | ICD-10-CM

## 2012-12-10 DIAGNOSIS — M25551 Pain in right hip: Secondary | ICD-10-CM | POA: Insufficient documentation

## 2012-12-10 DIAGNOSIS — E1169 Type 2 diabetes mellitus with other specified complication: Secondary | ICD-10-CM

## 2012-12-10 DIAGNOSIS — L089 Local infection of the skin and subcutaneous tissue, unspecified: Secondary | ICD-10-CM

## 2012-12-10 DIAGNOSIS — IMO0002 Reserved for concepts with insufficient information to code with codable children: Secondary | ICD-10-CM

## 2012-12-10 LAB — POCT GLYCOSYLATED HEMOGLOBIN (HGB A1C): Hemoglobin A1C: 9.6

## 2012-12-10 LAB — GLUCOSE, CAPILLARY: Glucose-Capillary: 457 mg/dL — ABNORMAL HIGH (ref 70–99)

## 2012-12-10 MED ORDER — CYCLOBENZAPRINE HCL 5 MG PO TABS: 5.0000 mg | ORAL_TABLET | Freq: Three times a day (TID) | ORAL | Status: DC | PRN

## 2012-12-10 NOTE — Assessment & Plan Note (Signed)
Quit smoking. 

## 2012-12-10 NOTE — Assessment & Plan Note (Signed)
MSK type pain.  Likely muscle spasm vs Small avulsion of the superior anterior iliac spine as he is point tender there. Conservative therapy at this time Tylenol and NSAIDs for pain (though pt cautioned about overuse w/ NSAIDs due to recent renal injury though most recent Cr normal) Flexeril 5-10mg  PRN.  F/u PRN

## 2012-12-10 NOTE — Patient Instructions (Addendum)
Your pain is likely from a muscle strain vs a possible avulsion fracture Increase your Metformin to 1000mg  twice a day Take ibuprofen 600mg  up to 4 times a day for the hip pain Take flexeril 1-2 tablets for the hip/side pain You have to get your sugar under better control. You are at high risk for severe heart, kidney, and foot disease  Please increase you daily insulin by 3 units every day that your morning sugar is above 200. Please decrease your daily insulin by 3 units every day that your morning sugar is below 100.  Please follow up with Dr. Jess Barters sometime in the next 1-2 weeks.

## 2012-12-10 NOTE — Progress Notes (Signed)
[name] is a 47 y.o. male who presents to [facility] today for R hip pain  R Hip pain: started 3 wks. Denies trauma. Sharp pain. Comes and goes. Has not taken anything to make is better. Lying on side improves. Emesis every morning w/ coughing. Endorses diarrhea for a couple of weeks. Denies constipation, fever, abdominal pain, dysuria.   DM: CBG at home typically 350. Taking 12 units lantus daily. Occasional dizziness, lightheadedness. Irritable per his wife. Rodolph Bong, Toast this am and sugar is now 457.   Foot ulceration: Well healing. No further ulcerations or skin breakdown. Sensation intact  The following portions of the patient's history were reviewed and updated as appropriate: allergies, current medications, past medical history, family and social history, and problem list.  Patient is a nonsmoker  Past Medical History  Diagnosis Date  . Hypertension   . Diabetes mellitus     Type 2 DM  x 24 years  . Foreign body in left foot     with  infection    ROS as above otherwise neg.    Medications reviewed. Current Outpatient Prescriptions  Medication Sig Dispense Refill  . cyclobenzaprine (FLEXERIL) 5 MG tablet Take 1-2 tablets (5-10 mg total) by mouth 3 (three) times daily as needed for muscle spasms.  30 tablet  0  . insulin glargine (LANTUS) 100 UNIT/ML injection Inject 10 Units into the skin at bedtime.  10 mL    . lisinopril (PRINIVIL,ZESTRIL) 10 MG tablet Take 1 tablet (10 mg total) by mouth daily.      . metFORMIN (GLUCOPHAGE) 500 MG tablet Take 1 tablet (500 mg total) by mouth 2 (two) times daily with a meal.  180 tablet  3  . omeprazole (PRILOSEC) 40 MG capsule Take 1 capsule (40 mg total) by mouth daily.  30 capsule  3  . sildenafil (VIAGRA) 100 MG tablet Take 0.5-1 tablets (50-100 mg total) by mouth daily as needed for erectile dysfunction ( Take 45min to 1 hour before intercourse).  5 tablet  11   No current facility-administered medications for this visit.    Facility-Administered Medications Ordered in Other Visits  Medication Dose Route Frequency Provider Last Rate Last Dose  . sodium chloride irrigation 0.9 %    PRN Newt Minion, MD   1,000 mL at 04/23/12 0740    Exam: BP 143/89  Pulse 88  Temp(Src) 98.3 F (36.8 C) (Oral)  Ht 5\' 10"  (1.778 m)  Wt 204 lb 7 oz (92.732 kg)  BMI 29.33 kg/m2 Gen: Well NAD HEENT: EOMI,  MMM Lungs:Nl WOB Abd: NABS, NT, ND MSK: R pain at Rsuperior iliac spine and lateral abdominal wall muscle tightness Exts: Non edematous BL  LE, warm and well perfused.  Neuro: CN 2-12 grossly intact. Sensation intact in toes.   Results for orders placed in visit on 12/10/12 (from the past 72 hour(s))  POCT GLYCOSYLATED HEMOGLOBIN (HGB A1C)     Status: Abnormal   Collection Time    12/10/12 10:05 AM      Result Value Range   Hemoglobin A1C 9.6    GLUCOSE, CAPILLARY     Status: Abnormal   Collection Time    12/10/12 10:11 AM      Result Value Range   Glucose-Capillary 457 (*) 70 - 99 mg/dL   Comment 1 Call MD NNP PA CNM     Comment 2 MD NOTIFIED

## 2012-12-10 NOTE — Assessment & Plan Note (Addendum)
Pt glucose today >450. Had frank discussion w/ pt regarding severe health risks associated w/ elevated sugar.  Pt o meet w/ PCP w/in 1-2 wks to discuss further Increase Metformin to 1000 BID Pt given titration instructions for lantus. Currently taking 12 units daily  - Please increase you daily insulin by 3 units every day that your morning sugar is above 200. - Please decrease your daily insulin by 3 units every day that your morning sugar is below 100.

## 2012-12-10 NOTE — Assessment & Plan Note (Signed)
No signs of active infection Healing well Pt must get sugar under control or he risks future infections and amputation

## 2012-12-18 ENCOUNTER — Ambulatory Visit: Payer: Self-pay | Admitting: Family Medicine

## 2012-12-23 ENCOUNTER — Encounter: Payer: Self-pay | Admitting: *Deleted

## 2012-12-29 ENCOUNTER — Ambulatory Visit (INDEPENDENT_AMBULATORY_CARE_PROVIDER_SITE_OTHER): Payer: Medicaid Other | Admitting: Family Medicine

## 2012-12-29 ENCOUNTER — Encounter: Payer: Self-pay | Admitting: Family Medicine

## 2012-12-29 VITALS — BP 126/80 | HR 89 | Temp 99.0°F | Ht 70.0 in | Wt 206.0 lb

## 2012-12-29 DIAGNOSIS — IMO0001 Reserved for inherently not codable concepts without codable children: Secondary | ICD-10-CM

## 2012-12-29 DIAGNOSIS — I10 Essential (primary) hypertension: Secondary | ICD-10-CM

## 2012-12-29 DIAGNOSIS — IMO0002 Reserved for concepts with insufficient information to code with codable children: Secondary | ICD-10-CM

## 2012-12-29 DIAGNOSIS — E1165 Type 2 diabetes mellitus with hyperglycemia: Secondary | ICD-10-CM

## 2012-12-29 MED ORDER — INSULIN GLARGINE 100 UNIT/ML ~~LOC~~ SOLN: 22.0000 [IU] | SUBCUTANEOUS | Status: DC

## 2012-12-29 NOTE — Patient Instructions (Signed)
General Instructions:  It was good to see you today! Please take 22 units of Lantus each morning.  If your morning blood sugar isn't between 120 and 140 for several days in a row, you can go up to 24 units. Plan on coming back to see [place] in about 3 month.  Treatment Goals:  Goals (1 Years of Data) as of 12/29/12         As of Today 12/10/12 10/21/12 10/08/12 10/05/12     Blood Pressure    . Blood Pressure < 140/90  126/80 143/89 155/83 104/69 164/103     Result Component    . HEMOGLOBIN A1C < 7.0   9.6         Progress Toward Treatment Goals:  Treatment Goal 12/29/2012  Blood pressure at goal    Self Care Goals & Plans:  Self Care Goal 12/29/2012  Manage my medications take my medicines as prescribed  Monitor my health keep track of my blood glucose  Eat healthy foods drink diet soda or water instead of juice or soda    Home Blood Glucose Monitoring 12/29/2012  Check my blood sugar once a day  When to check my blood sugar before breakfast     Care Management & Community Referrals:  Referral 12/29/2012  Referrals made for care management support none needed

## 2012-12-29 NOTE — Progress Notes (Signed)
Patient ID: [name], male   DOB: 1966/09/14, 47 y.o.   MRN: [id number] Subjective: The patient is a 47 y.o. year old male who presents today for f/u.  1. DM: Self titrated to 20 units lantus qam.  Reports am cbgs in the 160 range, no lows.  Also taking 1000mg  metformin bid without problems.  2. HTN: Not checking bp regularly, no cp/doe/sob/LE edema.  Patient's past medical, social, and family history were reviewed and updated as appropriate. History  Substance Use Topics  . Smoking status: Former Smoker    Types: Cigarettes    Quit date: 09/03/2012  . Smokeless tobacco: Former Systems developer    Quit date: 09/03/2012  . Alcohol Use: 4.8 oz/week    6 Cans of beer, 2 Shots of liquor per week     Comment: weekends.   Objective:  Filed Vitals:   12/29/12 1500  BP: 126/80  Pulse: 89  Temp: 99 F (37.2 C)   Gen: NAD CV: RRR, no murmurs Resp: CTABL  Assessment/Plan:  Please also see individual problems in problem list for problem-specific plans.

## 2012-12-29 NOTE — Assessment & Plan Note (Signed)
Currently at goal.  Cont current meds.  Encourage weekly bp check.

## 2012-12-29 NOTE — Assessment & Plan Note (Signed)
Cont titration lantus to fasting cbg of 120-140.  Repeat A1c in 3 months.  If not at goal, will refer to pharmacy clinic for titration of meal time insulin.

## 2013-01-17 ENCOUNTER — Other Ambulatory Visit: Payer: Self-pay | Admitting: Family Medicine

## 2013-01-21 ENCOUNTER — Other Ambulatory Visit: Payer: Self-pay | Admitting: Family Medicine

## 2013-02-16 ENCOUNTER — Other Ambulatory Visit: Payer: Self-pay | Admitting: Family Medicine

## 2013-03-07 ENCOUNTER — Other Ambulatory Visit: Payer: Self-pay | Admitting: Family Medicine

## 2013-03-16 ENCOUNTER — Telehealth: Payer: Self-pay | Admitting: Family Medicine

## 2013-03-16 NOTE — Telephone Encounter (Signed)
Scheduled patient an appt with Dr. Jess Barters for 03/23/2013 to discuss.   [name], [name], [place]

## 2013-03-16 NOTE — Telephone Encounter (Signed)
Patient is needing an extension on his Handicapped Placard.  His expires this month.  Please call him when ready.

## 2013-03-23 ENCOUNTER — Ambulatory Visit: Payer: Self-pay | Admitting: Family Medicine

## 2013-03-25 ENCOUNTER — Other Ambulatory Visit: Payer: Self-pay | Admitting: Family Medicine

## 2013-03-26 ENCOUNTER — Other Ambulatory Visit: Payer: Self-pay | Admitting: *Deleted

## 2013-03-26 ENCOUNTER — Telehealth: Payer: Self-pay | Admitting: Family Medicine

## 2013-03-26 MED ORDER — LISINOPRIL 10 MG PO TABS: ORAL_TABLET | ORAL | Status: DC

## 2013-03-26 NOTE — Telephone Encounter (Signed)
Patient is calling because since his dose of Metformin was increased, he has run out faster so he needs a new Rx sent to [facility] on [address].  He does not have enough to get him through the weekend.  Because the the old Rx is incorrect for the dose change, the pharmacy could not send in an accurate refill request.  Patient has scheduled an appt for 6/23.

## 2013-03-26 NOTE — Telephone Encounter (Signed)
On 12/10/12, Metformin increased to 1000mg  BID.  Called to pharmacy Metformin 500mg  two pills in the am and two pills in the pm. Disp#120 with 3 refills.  Pt reminded of appt next week.  Kervin Bones, Loralyn Freshwater, [place]

## 2013-03-29 ENCOUNTER — Ambulatory Visit (INDEPENDENT_AMBULATORY_CARE_PROVIDER_SITE_OTHER): Payer: Medicaid Other | Admitting: Family Medicine

## 2013-03-29 ENCOUNTER — Encounter: Payer: Self-pay | Admitting: Family Medicine

## 2013-03-29 ENCOUNTER — Telehealth: Payer: Self-pay | Admitting: Family Medicine

## 2013-03-29 VITALS — BP 127/84 | HR 82 | Temp 98.3°F | Ht 71.0 in | Wt 214.0 lb

## 2013-03-29 DIAGNOSIS — IMO0001 Reserved for inherently not codable concepts without codable children: Secondary | ICD-10-CM

## 2013-03-29 DIAGNOSIS — E1165 Type 2 diabetes mellitus with hyperglycemia: Secondary | ICD-10-CM

## 2013-03-29 DIAGNOSIS — IMO0002 Reserved for concepts with insufficient information to code with codable children: Secondary | ICD-10-CM

## 2013-03-29 LAB — POCT GLYCOSYLATED HEMOGLOBIN (HGB A1C): Hemoglobin A1C: 8.9

## 2013-03-29 MED ORDER — INSULIN ASPART 100 UNIT/ML ~~LOC~~ SOLN: SUBCUTANEOUS | Status: DC

## 2013-03-29 MED ORDER — INSULIN GLARGINE 100 UNIT/ML ~~LOC~~ SOLN: 26.0000 [IU] | Freq: Every day | SUBCUTANEOUS | Status: DC

## 2013-03-29 NOTE — Patient Instructions (Signed)
Insulin Sliding Scale: Blood sugar  Units novolog <150   0 150-199  1 200-249  2 250-299  3 300-349  4 350-399  5 >400   6  Start talking with the medication assistance program at the health department.  Take printed prescriptions with you. Ask the folks at our front desk for information about the orange card. Plan on coming back in 2-3 months to meet your new physician.

## 2013-03-29 NOTE — Telephone Encounter (Signed)
Patient dropped off form to be filled out for handicapped placard.  Please call when completed.

## 2013-03-29 NOTE — Telephone Encounter (Signed)
Patient wants to talk Dr. Jess Barters about medication shot should he change to  20 units or stay 10 units? Also handicap sticker Status.

## 2013-03-29 NOTE — Assessment & Plan Note (Signed)
Start sliding scale as noted in patient instructions.

## 2013-03-29 NOTE — Progress Notes (Signed)
Patient ID: [name], male   DOB: 02-Aug-1966, 47 y.o.   MRN: [id number] Subjective: The patient is a 47 y.o. year old male who presents today for diabetes followup.  1. Diabetes: Patient reports he has been going to large number of her situation parties and is not watch what he is eating. He reports blood sugars in the upper 200s on multiple occasions. He has not had any low blood sugars. He reports a when he is eating what he should, his fasting blood sugar on 26 units of Lantus is around 120.  2. Insurance: Patient has been denied and extension of his Medicaid. He'll become uninsured in another month or 2.  3. Handicap placard: Patient's handicap placard run out and another one month. He continues to have problems with walking greater than 200 feet without stopping to 2 his feet. He is gradually working on increasing this as he can walk. He is requesting a 6 extension.  Patient's past medical, social, and family history were reviewed and updated as appropriate. History  Substance Use Topics  . Smoking status: Former Smoker    Types: Cigarettes    Quit date: 09/03/2012  . Smokeless tobacco: Former Systems developer    Quit date: 09/03/2012  . Alcohol Use: 4.8 oz/week    6 Cans of beer, 2 Shots of liquor per week     Comment: weekends.   Objective:  Filed Vitals:   03/29/13 0951  BP: 127/84  Pulse: 82  Temp: 98.3 F (36.8 C)   Gen: No acute distress, obese  Assessment/Plan: Happy to fill out handicapped permit app. Orange card/MAP application  Please also see individual problems in problem list for problem-specific plans.

## 2013-03-30 ENCOUNTER — Telehealth: Payer: Self-pay | Admitting: *Deleted

## 2013-03-30 NOTE — Telephone Encounter (Signed)
handicap placard form complete and placed up front for pick up - will inform pt when I call this afternoon. Tildon Husky, RN-BSN

## 2013-03-30 NOTE — Telephone Encounter (Signed)
Clinic info completed and physician areas highlighted and placed in box. - please sign and complete - return to E. Jonuel Butterfield when done. Tildon Husky, RN-BSN

## 2013-03-30 NOTE — Telephone Encounter (Signed)
Will FWD to PCP.  Walden Statz, Loralyn Freshwater, [place]

## 2013-03-30 NOTE — Telephone Encounter (Signed)
Called pt to check on understanding of instructions regarding insulin. Pt reports that "It was 263 at breakfast and so I took 20 cc of insulin and I didn't eat nothing - I'm getting ready to eat now and it is 194 and I took my metformin this morning too." Pt instructed to not take that much insulin at one time. Read AVS sliding scale directions to pt over the phone and offered to print them for him. Pt verbalized understanding. Also suggested that he come in for nurse visit or pharmacy visit for insulin education. Dr. Jess Barters informed of pt misuse of insulin. Nurse will call pt this afternoon to check on how he is feeling. Earl Husky, RN-BSN

## 2013-03-30 NOTE — Telephone Encounter (Signed)
For now, we are just doing SSI with lunch/dinner.  So do nothing.  Check CBG at lunch and use sliding scale as outlined in PI of last visit.  (1:50 over 150)

## 2013-03-30 NOTE — Telephone Encounter (Signed)
Called pt to check on blood sugar levels  - message left stating such and that pt needs to call and make appointment with Dr. Valentina Lucks regarding insulin and that form is ready to be picked up at front desk. Tildon Husky, RN-BSN

## 2013-03-30 NOTE — Telephone Encounter (Signed)
Done.  Also, please have patient schedule with Dr. Valentina Lucks to discuss insulin therapy.  Thanks!

## 2013-03-30 NOTE — Telephone Encounter (Signed)
Patient was seen yesterday and received some insulin. He took his injection this morning of Novolog 100 and he states that his blood sugar is still very high. His blood sugar level is 237. He wants to know if he should take another dose?

## 2013-07-26 ENCOUNTER — Ambulatory Visit: Payer: Self-pay | Admitting: Family Medicine

## 2013-09-28 ENCOUNTER — Telehealth: Payer: Self-pay | Admitting: *Deleted

## 2013-09-28 DIAGNOSIS — IMO0002 Reserved for concepts with insufficient information to code with codable children: Secondary | ICD-10-CM

## 2013-09-28 DIAGNOSIS — E1165 Type 2 diabetes mellitus with hyperglycemia: Secondary | ICD-10-CM

## 2013-09-28 NOTE — Telephone Encounter (Signed)
LMOVM for pt to return call.  Please advise that she is due for a cholesterol screen, order placed.  Please schedule a fasting lab appt. [place], [name]

## 2013-10-14 ENCOUNTER — Telehealth: Payer: Self-pay | Admitting: Family Medicine

## 2013-10-14 NOTE — Telephone Encounter (Signed)
Please call pt and inform I refilled his request for supplies for his diabetes. However, he needs to make an appointment within 1-2 months,  for future refills on medications and supplies. He has not followed up on her chronic management >6 months. He also is assigned new PCP he has not met. Thanks.

## 2013-10-14 NOTE — Telephone Encounter (Signed)
LVM for patient to call back. ?

## 2013-10-18 NOTE — Telephone Encounter (Signed)
LVM for patient to call back to inform him of below

## 2013-10-19 NOTE — Telephone Encounter (Signed)
Left 3rd message on voicemail requesting a return call. Hong Moring, Lewie Loron

## 2013-11-10 ENCOUNTER — Other Ambulatory Visit: Payer: Self-pay | Admitting: Family Medicine

## 2014-02-21 ENCOUNTER — Ambulatory Visit: Payer: Self-pay | Admitting: Family Medicine

## 2014-03-05 ENCOUNTER — Emergency Department (HOSPITAL_COMMUNITY)
Admission: EM | Admit: 2014-03-05 | Discharge: 2014-03-05 | Disposition: A | Discharge status: Home / Self Care | Payer: Medicaid Other | Attending: Emergency Medicine | Admitting: Emergency Medicine

## 2014-03-05 ENCOUNTER — Encounter (HOSPITAL_COMMUNITY): Payer: Self-pay | Admitting: Emergency Medicine

## 2014-03-05 DIAGNOSIS — E119 Type 2 diabetes mellitus without complications: Secondary | ICD-10-CM | POA: Insufficient documentation

## 2014-03-05 DIAGNOSIS — Z87891 Personal history of nicotine dependence: Secondary | ICD-10-CM | POA: Insufficient documentation

## 2014-03-05 DIAGNOSIS — L03818 Cellulitis of other sites: Principal | ICD-10-CM

## 2014-03-05 DIAGNOSIS — Z87828 Personal history of other (healed) physical injury and trauma: Secondary | ICD-10-CM | POA: Insufficient documentation

## 2014-03-05 DIAGNOSIS — Z79899 Other long term (current) drug therapy: Secondary | ICD-10-CM | POA: Insufficient documentation

## 2014-03-05 DIAGNOSIS — Z9119 Patient's noncompliance with other medical treatment and regimen: Secondary | ICD-10-CM | POA: Insufficient documentation

## 2014-03-05 DIAGNOSIS — Z91040 Latex allergy status: Secondary | ICD-10-CM | POA: Insufficient documentation

## 2014-03-05 DIAGNOSIS — R739 Hyperglycemia, unspecified: Secondary | ICD-10-CM

## 2014-03-05 DIAGNOSIS — Z91199 Patient's noncompliance with other medical treatment and regimen due to unspecified reason: Secondary | ICD-10-CM | POA: Insufficient documentation

## 2014-03-05 DIAGNOSIS — L02811 Cutaneous abscess of head [any part, except face]: Secondary | ICD-10-CM

## 2014-03-05 DIAGNOSIS — I10 Essential (primary) hypertension: Secondary | ICD-10-CM

## 2014-03-05 DIAGNOSIS — Z794 Long term (current) use of insulin: Secondary | ICD-10-CM | POA: Insufficient documentation

## 2014-03-05 DIAGNOSIS — L02818 Cutaneous abscess of other sites: Secondary | ICD-10-CM | POA: Insufficient documentation

## 2014-03-05 DIAGNOSIS — Z9114 Patient's other noncompliance with medication regimen: Secondary | ICD-10-CM

## 2014-03-05 LAB — COMPREHENSIVE METABOLIC PANEL
ALT: 39 U/L (ref 0–53)
AST: 21 U/L (ref 0–37)
Albumin: 3.3 g/dL — ABNORMAL LOW (ref 3.5–5.2)
Alkaline Phosphatase: 121 U/L — ABNORMAL HIGH (ref 39–117)
BILIRUBIN TOTAL: 0.6 mg/dL (ref 0.3–1.2)
BUN: 19 mg/dL (ref 6–23)
CALCIUM: 10.5 mg/dL (ref 8.4–10.5)
CHLORIDE: 97 meq/L (ref 96–112)
CO2: 27 meq/L (ref 19–32)
CREATININE: 0.8 mg/dL (ref 0.50–1.35)
GLUCOSE: 350 mg/dL — AB (ref 70–99)
Potassium: 4.4 mEq/L (ref 3.7–5.3)
Sodium: 138 mEq/L (ref 137–147)
Total Protein: 7.2 g/dL (ref 6.0–8.3)

## 2014-03-05 LAB — CBC WITH DIFFERENTIAL/PLATELET
BASOS ABS: 0 10*3/uL (ref 0.0–0.1)
Basophils Relative: 0 % (ref 0–1)
EOS PCT: 4 % (ref 0–5)
Eosinophils Absolute: 0.4 10*3/uL (ref 0.0–0.7)
HEMATOCRIT: 41.6 % (ref 39.0–52.0)
HEMOGLOBIN: 15.1 g/dL (ref 13.0–17.0)
LYMPHS ABS: 1.4 10*3/uL (ref 0.7–4.0)
LYMPHS PCT: 14 % (ref 12–46)
MCH: 32.5 pg (ref 26.0–34.0)
MCHC: 36.3 g/dL — ABNORMAL HIGH (ref 30.0–36.0)
MCV: 89.7 fL (ref 78.0–100.0)
MONO ABS: 0.6 10*3/uL (ref 0.1–1.0)
Monocytes Relative: 6 % (ref 3–12)
NEUTROS ABS: 7.4 10*3/uL (ref 1.7–7.7)
Neutrophils Relative %: 76 % (ref 43–77)
Platelets: 138 10*3/uL — ABNORMAL LOW (ref 150–400)
RBC: 4.64 MIL/uL (ref 4.22–5.81)
RDW: 12.1 % (ref 11.5–15.5)
WBC: 9.7 10*3/uL (ref 4.0–10.5)

## 2014-03-05 LAB — CBG MONITORING, ED: GLUCOSE-CAPILLARY: 356 mg/dL — AB (ref 70–99)

## 2014-03-05 MED ORDER — CEPHALEXIN 250 MG PO CAPS
500.0000 mg | ORAL_CAPSULE | Freq: Once | ORAL | Status: AC
  Administered 2014-03-05: 500 mg via ORAL
  Filled 2014-03-05: qty 2

## 2014-03-05 MED ORDER — METFORMIN HCL 500 MG PO TABS: 500.0000 mg | ORAL_TABLET | Freq: Two times a day (BID) | ORAL | Status: DC

## 2014-03-05 MED ORDER — INSULIN ASPART 100 UNIT/ML ~~LOC~~ SOLN
10.0000 [IU] | Freq: Once | SUBCUTANEOUS | Status: AC
  Administered 2014-03-05: 10 [IU] via SUBCUTANEOUS
  Filled 2014-03-05: qty 1

## 2014-03-05 MED ORDER — INSULIN GLARGINE 100 UNIT/ML ~~LOC~~ SOLN: 26.0000 [IU] | Freq: Every day | SUBCUTANEOUS | Status: DC

## 2014-03-05 MED ORDER — METFORMIN HCL 500 MG PO TABS
1000.0000 mg | ORAL_TABLET | Freq: Once | ORAL | Status: AC
  Administered 2014-03-05: 1000 mg via ORAL
  Filled 2014-03-05: qty 2

## 2014-03-05 MED ORDER — LISINOPRIL 20 MG PO TABS: 20.0000 mg | ORAL_TABLET | Freq: Every day | ORAL | Status: DC

## 2014-03-05 MED ORDER — INSULIN ASPART 100 UNIT/ML ~~LOC~~ SOLN: SUBCUTANEOUS | Status: DC

## 2014-03-05 MED ORDER — OXYCODONE-ACETAMINOPHEN 5-325 MG PO TABS
1.0000 | ORAL_TABLET | Freq: Once | ORAL | Status: AC
  Administered 2014-03-05: 1 via ORAL
  Filled 2014-03-05: qty 1

## 2014-03-05 MED ORDER — SULFAMETHOXAZOLE-TRIMETHOPRIM 800-160 MG PO TABS: 1.0000 | ORAL_TABLET | Freq: Two times a day (BID) | ORAL | Status: DC

## 2014-03-05 MED ORDER — LISINOPRIL 20 MG PO TABS
20.0000 mg | ORAL_TABLET | Freq: Once | ORAL | Status: AC
  Administered 2014-03-05: 20 mg via ORAL
  Filled 2014-03-05: qty 1

## 2014-03-05 MED ORDER — CEPHALEXIN 500 MG PO CAPS: 500.0000 mg | ORAL_CAPSULE | Freq: Four times a day (QID) | ORAL | Status: DC

## 2014-03-05 MED ORDER — SULFAMETHOXAZOLE-TMP DS 800-160 MG PO TABS
1.0000 | ORAL_TABLET | Freq: Once | ORAL | Status: AC
  Administered 2014-03-05: 1 via ORAL
  Filled 2014-03-05: qty 1

## 2014-03-05 MED ORDER — OXYCODONE-ACETAMINOPHEN 5-325 MG PO TABS: 1.0000 | ORAL_TABLET | ORAL | Status: DC | PRN

## 2014-03-05 NOTE — Progress Notes (Signed)
  CARE MANAGEMENT ED NOTE 03/07/2014  Patient:  [name]   Account Number:  [id number]  Date Initiated:  03/05/2014  Documentation initiated by:  [facility]  Subjective/Objective Assessment:   Pt presented to [facility] ED with abscess to back of head, patient is uninsured, is a diabetic and have not had meds in at least 3 months     Subjective/Objective Assessment Detail:     Action/Plan:   Medication assistance.   Action/Plan Detail:   Anticipated DC Date:  03/05/2014     Status Recommendation to Physician:   Result of Recommendation:  Agreed  Other ED [place]  CM consult  [place] Program    Choice offered to / List presented to:  [name]          Status of service:  Completed, signed off  ED Comments:   ED Comments Detail:  03/05/2014 17:14 W. Stann Mainland RN BSN ED CM consulted by Izora Gala RN concerning medications assisstance. Pt presented to [facility] ED with abscess to back of head:Pa is also a diabetic who has not taken medication within last 3 months.  Pt is unisured without a PCP.  CM reviewed EPIC notes and chart review information CM spoke with patient by phone confirmed information. Discussed the [place] program ($3 co pay for each Rx through [facility] program, does not include refills, 7 day expiration of [place] letter and choice of pharmacies)  Pt verbalized understanding, appreciation and  is agreeable. Pt is eligible for MATCH. Enrolled patient in [facility] program. Discussed  The importance and benefits of Primary Care patient agreed. Offered assistance with establishing Primary Care. Provided the list of some local affordable PCP and [employer]. Pt interested in the [facility] [place]. Discussed the services that are provided at the clinic. ED CM will provide clinic brochure to patient. Pt appreciative of assistance. Pt instructed to walk in to schedule appointment on :, patient agreeable to plan. ED CM will contact [facility] regarding patient.  [place]  letter printed and given to patient with [facility] brochure  Pt verbalized understanding of instructions teach back done. Contact information given to patient if any further questions or concerns should arise.  Discussed discharge plan with T. Kinchenko EDP and Terrance RN agreeable to plan.. No further ED CM needs identified.

## 2014-03-05 NOTE — ED Provider Notes (Signed)
CSN: [id number]     Arrival date & time 03/05/14  1432 History   First MD Initiated Contact with Patient 03/05/14 1526     Chief Complaint  Patient presents with  . Abscess     (Consider location/radiation/quality/duration/timing/severity/associated sxs/prior Treatment) HPI [name] is a 48 y.o. male who presents emergency department complaining of an abscess to the back of his head. Patient states that the abscess began 5 days ago, gradually getting worse. He's got history of abscesses to his lower extremities. He states he has been putting warm compresses with no improvement. He states it's very tender. There is no drainage. He denies any fever, chills, malaise. Patient states his diabetic and ran out of his insulin. He states he ran out of metformin as well. States unable to get his medications for a few more weeks because he gets him through [place] program. He denies any other complaints.  Past Medical History  Diagnosis Date  . Hypertension   . Diabetes mellitus     Type 2 DM  x 24 years  . Foreign body in left foot     with  infection   Past Surgical History  Procedure Laterality Date  . No past surgeries    . I&d extremity  04/23/2012    Procedure: IRRIGATION AND DEBRIDEMENT EXTREMITY;  Surgeon: Newt Minion, MD;  Location: [facility];  Service: Orthopedics;  Laterality: Left;  Irrigation and Debridement Left Foot  . I&d extremity  09/18/2012    Procedure: IRRIGATION AND DEBRIDEMENT EXTREMITY;  Surgeon: Newt Minion, MD;  Location: [facility];  Service: Orthopedics;  Laterality: Left;   Family History  Problem Relation Age of Onset  . Diabetes type II Mother   . Diabetes type II Sister    History  Substance Use Topics  . Smoking status: Former Smoker    Types: Cigarettes    Quit date: 09/03/2012  . Smokeless tobacco: Former Systems developer    Quit date: 09/03/2012  . Alcohol Use: 4.8 oz/week    6 Cans of beer, 2 Shots of liquor per week     Comment: weekends.    Review of Systems   Constitutional: Negative for fever and chills.  Respiratory: Negative for cough, chest tightness and shortness of breath.   Cardiovascular: Negative for chest pain, palpitations and leg swelling.  Gastrointestinal: Negative for nausea, vomiting, abdominal pain, diarrhea and abdominal distention.  Genitourinary: Negative for dysuria, urgency, frequency and hematuria.  Musculoskeletal: Negative for arthralgias, myalgias, neck pain and neck stiffness.  Skin: Positive for wound.  Allergic/Immunologic: Negative for immunocompromised state.  Neurological: Negative for dizziness, weakness, light-headedness, numbness and headaches.      Allergies  Latex  Home Medications   Prior to Admission medications   Medication Sig Start Date End Date Taking? Authorizing Provider  Ibuprofen-Diphenhydramine Cit (IBUPROFEN PM) 200-38 MG TABS Take 2 tablets by mouth at bedtime as needed (for sleep and pain).   Yes Historical Provider, MD  insulin aspart (NOVOLOG) 100 UNIT/ML injection Inject 3-6 Units into the skin 2 (two) times daily.   Yes Historical Provider, MD  insulin glargine (LANTUS) 100 UNIT/ML injection Inject 0.26 mLs (26 Units total) into the skin daily. 03/29/13  Yes Vivi Ferns, MD  metFORMIN (GLUCOPHAGE) 500 MG tablet Take 500 mg by mouth 2 (two) times daily with a meal.   Yes Historical Provider, MD   BP 183/101  Pulse 96  Temp(Src) 98.1 F (36.7 C) (Oral)  Resp 18  SpO2 95% Physical Exam  Nursing note and vitals reviewed. Constitutional: He appears well-developed and well-nourished. No distress.  HENT:  Head: Normocephalic and atraumatic.  Eyes: Conjunctivae are normal.  Neck: Neck supple.  Cardiovascular: Normal rate, regular rhythm and normal heart sounds.   Pulmonary/Chest: Effort normal. No respiratory distress. He has no wheezes. He has no rales.  Abdominal: Soft. Bowel sounds are normal. He exhibits no distension. There is no tenderness. There is no rebound.   Musculoskeletal: He exhibits no edema.  Neurological: He is alert.  Skin: Skin is warm and dry.  Approximately 4x4cm abscess to the upper neck/base of the scalp. No drainage. Tender. No surrounding cellulitis.     ED Course  Procedures (including critical care time) Labs Review Labs Reviewed  CBC WITH DIFFERENTIAL  COMPREHENSIVE METABOLIC PANEL  CBG MONITORING, ED    Imaging Review No results found.   EKG Interpretation None      INCISION AND DRAINAGE Performed by: Florene Route Shamarion Coots Consent: Verbal consent obtained. Risks and benefits: risks, benefits and alternatives were discussed Type: abscess  Body area: back of the scalp  Anesthesia: local infiltration  Incision was made with a scalpel.  Local anesthetic: lidocaine 2% w epinephrine  Anesthetic total: 3 ml  Complexity: complex Blunt dissection to break up loculations  Drainage: purulent  Drainage amount: moderate  Packing material: 1/4 in iodoform gauze  Patient tolerance: Patient tolerated the procedure well with no immediate complications.    MDM   Final diagnoses:  Abscess of scalp  Hyperglycemia  Hypertension  Non compliance w medication regimen    Pt in ED with abscess to the back of the scalp. He is afebrile, nontoxic appearing. He has no other signs or symptoms to suggest sepsis. He is hypertensive, he's not taking his lisinopril. He is also hyperglycemic and states he has not had his medications in over a week. Will check electrolytes, CBC. I have ordered him 10 units of insulin subcutaneous, also ordered his lisinopril. Abscess incised and drained. At this time no surrounding cellulitis. He will be placed on a biotics due to his diabetes and history worsening infections.   8:11 PM Not in DKA. Labs all normal except for hyperglycemia. Afebrile. Normal WBC. Pt seen by case management, given a match program for his medications. Given prescriptions for insulin and lisinopril and  metformin. Also will start on keflex and bactrim for infection. Home with follow up in two days.   Filed Vitals:   03/05/14 1437 03/05/14 1715 03/05/14 1902  BP: 183/101 173/102 189/96  Pulse: 96  93  Temp: 98.1 F (36.7 C)  98.7 F (37.1 C)  TempSrc: Oral  Oral  Resp: 18  20  SpO2: 95%  96%     Renold Genta, PA-C 03/05/14 2014

## 2014-03-05 NOTE — ED Notes (Signed)
Per pt sts abscess on upper neck/head area that has been getting worse since Monday. sts he used warm compresses but getting worse.

## 2014-03-05 NOTE — Discharge Instructions (Signed)
Take keflex and bactrim for infection. Warm compresses. Keep clean. Return in 2 days for recheck. Oxycodone for pain. Continue all regular medications. Return if worsening sooner.     Abscess An abscess is an infected area that contains a collection of pus and debris.It can occur in almost any part of the body. An abscess is also known as a furuncle or boil. CAUSES  An abscess occurs when tissue gets infected. This can occur from blockage of oil or sweat glands, infection of hair follicles, or a minor injury to the skin. As the body tries to fight the infection, pus collects in the area and creates pressure under the skin. This pressure causes pain. People with weakened immune systems have difficulty fighting infections and get certain abscesses more often.  SYMPTOMS Usually an abscess develops on the skin and becomes a painful mass that is red, warm, and tender. If the abscess forms under the skin, you may feel a moveable soft area under the skin. Some abscesses break open (rupture) on their own, but most will continue to get worse without care. The infection can spread deeper into the body and eventually into the bloodstream, causing you to feel ill.  DIAGNOSIS  Your caregiver will take your medical history and perform a physical exam. A sample of fluid may also be taken from the abscess to determine what is causing your infection. TREATMENT  Your caregiver may prescribe antibiotic medicines to fight the infection. However, taking antibiotics alone usually does not cure an abscess. Your caregiver may need to make a small cut (incision) in the abscess to drain the pus. In some cases, gauze is packed into the abscess to reduce pain and to continue draining the area. HOME CARE INSTRUCTIONS   Only take over-the-counter or prescription medicines for pain, discomfort, or fever as directed by your caregiver.  If you were prescribed antibiotics, take them as directed. Finish them even if you start to  feel better.  If gauze is used, follow your caregiver's directions for changing the gauze.  To avoid spreading the infection:  Keep your draining abscess covered with a bandage.  Wash your hands well.  Do not share personal care items, towels, or whirlpools with others.  Avoid skin contact with others.  Keep your skin and clothes clean around the abscess.  Keep all follow-up appointments as directed by your caregiver. SEEK MEDICAL CARE IF:   You have increased pain, swelling, redness, fluid drainage, or bleeding.  You have muscle aches, chills, or a general ill feeling.  You have a fever. MAKE SURE YOU:   Understand these instructions.  Will watch your condition.  Will get help right away if you are not doing well or get worse. Document Released: 07/03/2005 Document Revised: 03/24/2012 Document Reviewed: 12/06/2011 [facility] Patient Information 2014 [place].

## 2014-03-07 NOTE — ED Provider Notes (Signed)
Medical screening examination/treatment/procedure(s) were performed by non-physician practitioner and as supervising physician I was immediately available for consultation/collaboration.   EKG Interpretation None       Richarda Blade, MD 03/07/14 1120

## 2014-03-16 ENCOUNTER — Ambulatory Visit: Payer: Self-pay | Admitting: Family Medicine

## 2014-04-06 ENCOUNTER — Encounter: Payer: Self-pay | Admitting: *Deleted

## 2014-04-06 NOTE — Progress Notes (Signed)
Lm for patient to call.  Please help him schedule an appt for his diabetes check up with PCP.  Also mailed letter to inform him of this. Candace Begue,CMA

## 2014-07-05 ENCOUNTER — Ambulatory Visit (INDEPENDENT_AMBULATORY_CARE_PROVIDER_SITE_OTHER): Payer: Self-pay | Admitting: Family Medicine

## 2014-07-05 ENCOUNTER — Ambulatory Visit (HOSPITAL_COMMUNITY)
Admission: RE | Admit: 2014-07-05 | Discharge: 2014-07-05 | Disposition: A | Discharge status: Home / Self Care | Payer: Medicaid Other | Source: Ambulatory Visit | Attending: Family Medicine | Admitting: Family Medicine

## 2014-07-05 ENCOUNTER — Encounter: Payer: Self-pay | Admitting: Family Medicine

## 2014-07-05 VITALS — BP 165/99 | HR 78 | Temp 98.7°F | Ht 71.0 in | Wt 206.0 lb

## 2014-07-05 DIAGNOSIS — S91321A Laceration with foreign body, right foot, initial encounter: Secondary | ICD-10-CM

## 2014-07-05 DIAGNOSIS — S90851A Superficial foreign body, right foot, initial encounter: Secondary | ICD-10-CM

## 2014-07-05 DIAGNOSIS — I70209 Unspecified atherosclerosis of native arteries of extremities, unspecified extremity: Secondary | ICD-10-CM | POA: Insufficient documentation

## 2014-07-05 DIAGNOSIS — IMO0002 Reserved for concepts with insufficient information to code with codable children: Secondary | ICD-10-CM

## 2014-07-05 DIAGNOSIS — S91309A Unspecified open wound, unspecified foot, initial encounter: Secondary | ICD-10-CM

## 2014-07-05 DIAGNOSIS — X58XXXA Exposure to other specified factors, initial encounter: Secondary | ICD-10-CM | POA: Insufficient documentation

## 2014-07-05 DIAGNOSIS — Z189 Retained foreign body fragments, unspecified material: Secondary | ICD-10-CM | POA: Insufficient documentation

## 2014-07-05 NOTE — Assessment & Plan Note (Signed)
Removal of 1 splinter in office without complication, unable to appreciate 2nd splinter. Advised to use triple antibiotic ointment. Sent for x-rays which did show an additional splinter at second area. Spoke with patient on the phone about plan for 2nd splinter, will call again tomorrow to see if an additional clinic spot can be made so that I could try to remove it before doing "watch and wait" approach vs podiatry referral. No clinical evidence of infection at this time.

## 2014-07-05 NOTE — Patient Instructions (Signed)
Get the x-ray done for the foot to look for any other foreign objects.  Use topical bactroban or triple antibiotic ointment.  KEEP A CLOSE EYE ON THE FOOT. ANY REDNESS, SWELLING, PAIN, FEVERS/CHILLS, COME BACK TO EVALUATED!

## 2014-07-05 NOTE — Progress Notes (Signed)
Patient ID: [name], male   DOB: Aug 07, 1966, 48 y.o.   MRN: [id number]   Subjective:    Patient ID: [name], male    DOB: May 13, 1966, 48 y.o.   MRN: [id number]  HPI  Patient presents for Same Day Appointment  CC: right foot pain  # Right foot pain:  Developed foot pain over the last few days, looked and saw splinters in the bottom of the foot and an area that started draining yesterday, dark spot.  Has history of similar splinters in the past, works with Scientist, research (life sciences) at work. He states that he does have hard-toed tennis shoes he uses, but also walks around in hospital socks a lot.  He thinks there might be a splinter in the area that started draining as well ROS: no fevers/chills, no numbness of toes, no redness of skin  Review of Systems   See HPI for ROS. All other systems reviewed and are negative.  Past medical history, surgical, family, and social history reviewed and updated in the EMR as appropriate.  Objective:  BP 165/99  Pulse 78  Temp(Src) 98.7 F (37.1 C) (Oral)  Ht 5\' 11"  (1.803 m)  Wt 206 lb (93.441 kg)  BMI 28.74 kg/m2 Vitals reviewed  General:  Ext: volar aspect right foot with 28mm splinter near distal metatarsal head, lateral to this is a 50mm circular mark consistent with dried blood under the skin and surrounding pallor of skin extending approx 66mm diameter. Both areas are tender to palpation  Skin: no erythema or rash extending away from sites described above  Removal note: Patient verbalized informed consent to remove splinter. Area was prepped with alcohol pad x 4. A 21g needle was used to re-tunnel insertion site of the splinter, forceps used to grab and remove splinter which was consistent with metal. A 21g needle was also used to puncture/drain the lateral area, however no splinter was identified.  Assessment & Plan:  See Problem List Documentation

## 2014-07-06 ENCOUNTER — Ambulatory Visit: Payer: Self-pay | Admitting: Family Medicine

## 2014-07-06 ENCOUNTER — Telehealth: Payer: Self-pay | Admitting: Family Medicine

## 2014-07-06 NOTE — Telephone Encounter (Signed)
Called and left message informing patient I scheduled him for 1:30pm today 07/06/14 as discussed yesterday. -Dr. Lamar Benes

## 2014-10-13 ENCOUNTER — Emergency Department (HOSPITAL_COMMUNITY)
Admission: EM | Admit: 2014-10-13 | Discharge: 2014-10-14 | Disposition: A | Discharge status: Home / Self Care | Payer: Self-pay | Attending: Emergency Medicine | Admitting: Emergency Medicine

## 2014-10-13 ENCOUNTER — Encounter (HOSPITAL_COMMUNITY): Payer: Self-pay | Admitting: *Deleted

## 2014-10-13 DIAGNOSIS — M791 Myalgia: Secondary | ICD-10-CM | POA: Insufficient documentation

## 2014-10-13 DIAGNOSIS — I10 Essential (primary) hypertension: Secondary | ICD-10-CM | POA: Insufficient documentation

## 2014-10-13 DIAGNOSIS — Z79891 Long term (current) use of opiate analgesic: Secondary | ICD-10-CM | POA: Insufficient documentation

## 2014-10-13 DIAGNOSIS — R067 Sneezing: Secondary | ICD-10-CM | POA: Insufficient documentation

## 2014-10-13 DIAGNOSIS — Z79899 Other long term (current) drug therapy: Secondary | ICD-10-CM | POA: Insufficient documentation

## 2014-10-13 DIAGNOSIS — E119 Type 2 diabetes mellitus without complications: Secondary | ICD-10-CM | POA: Insufficient documentation

## 2014-10-13 DIAGNOSIS — R059 Cough, unspecified: Secondary | ICD-10-CM

## 2014-10-13 DIAGNOSIS — Z87891 Personal history of nicotine dependence: Secondary | ICD-10-CM | POA: Insufficient documentation

## 2014-10-13 DIAGNOSIS — R1084 Generalized abdominal pain: Secondary | ICD-10-CM | POA: Insufficient documentation

## 2014-10-13 DIAGNOSIS — L02511 Cutaneous abscess of right hand: Secondary | ICD-10-CM | POA: Insufficient documentation

## 2014-10-13 DIAGNOSIS — Z91040 Latex allergy status: Secondary | ICD-10-CM | POA: Insufficient documentation

## 2014-10-13 DIAGNOSIS — R112 Nausea with vomiting, unspecified: Secondary | ICD-10-CM | POA: Insufficient documentation

## 2014-10-13 DIAGNOSIS — Z794 Long term (current) use of insulin: Secondary | ICD-10-CM | POA: Insufficient documentation

## 2014-10-13 DIAGNOSIS — Z791 Long term (current) use of non-steroidal anti-inflammatories (NSAID): Secondary | ICD-10-CM | POA: Insufficient documentation

## 2014-10-13 DIAGNOSIS — R197 Diarrhea, unspecified: Secondary | ICD-10-CM | POA: Insufficient documentation

## 2014-10-13 DIAGNOSIS — R05 Cough: Secondary | ICD-10-CM | POA: Insufficient documentation

## 2014-10-13 LAB — CBG MONITORING, ED: Glucose-Capillary: 383 mg/dL — ABNORMAL HIGH (ref 70–99)

## 2014-10-13 LAB — CBC WITH DIFFERENTIAL/PLATELET
BASOS ABS: 0 10*3/uL (ref 0.0–0.1)
BASOS PCT: 0 % (ref 0–1)
EOS ABS: 0.1 10*3/uL (ref 0.0–0.7)
EOS PCT: 2 % (ref 0–5)
HCT: 39.4 % (ref 39.0–52.0)
HEMOGLOBIN: 14.4 g/dL (ref 13.0–17.0)
LYMPHS ABS: 0.7 10*3/uL (ref 0.7–4.0)
Lymphocytes Relative: 13 % (ref 12–46)
MCH: 32.4 pg (ref 26.0–34.0)
MCHC: 36.5 g/dL — AB (ref 30.0–36.0)
MCV: 88.7 fL (ref 78.0–100.0)
Monocytes Absolute: 0.6 10*3/uL (ref 0.1–1.0)
Monocytes Relative: 11 % (ref 3–12)
Neutro Abs: 3.9 10*3/uL (ref 1.7–7.7)
Neutrophils Relative %: 74 % (ref 43–77)
Platelets: 146 10*3/uL — ABNORMAL LOW (ref 150–400)
RBC: 4.44 MIL/uL (ref 4.22–5.81)
RDW: 12.2 % (ref 11.5–15.5)
WBC: 5.3 10*3/uL (ref 4.0–10.5)

## 2014-10-13 LAB — COMPREHENSIVE METABOLIC PANEL
ALT: 33 U/L (ref 0–53)
AST: 31 U/L (ref 0–37)
Albumin: 3.3 g/dL — ABNORMAL LOW (ref 3.5–5.2)
Alkaline Phosphatase: 97 U/L (ref 39–117)
Anion gap: 8 (ref 5–15)
BUN: 15 mg/dL (ref 6–23)
CO2: 29 mmol/L (ref 19–32)
Calcium: 9 mg/dL (ref 8.4–10.5)
Chloride: 97 mEq/L (ref 96–112)
Creatinine, Ser: 1.11 mg/dL (ref 0.50–1.35)
GFR calc Af Amer: >90 mL/min (ref >90)
GFR, EST NON AFRICAN AMERICAN: 78 mL/min — AB (ref >90)
GLUCOSE: 389 mg/dL — AB (ref 70–99)
Potassium: 3.9 mmol/L (ref 3.5–5.1)
SODIUM: 134 mmol/L — AB (ref 135–145)
Total Bilirubin: 0.9 mg/dL (ref 0.3–1.2)
Total Protein: 6.8 g/dL (ref 6.0–8.3)

## 2014-10-13 LAB — PROTIME-INR
INR: 0.99 (ref 0.00–1.49)
Prothrombin Time: 13.1 seconds (ref 11.6–15.2)

## 2014-10-13 NOTE — ED Notes (Signed)
Pt presents with a abscess to his right hand. Abscess is red, warm, tender to palpation and hard. Pt is a type one diabetic.

## 2014-10-14 ENCOUNTER — Emergency Department (HOSPITAL_COMMUNITY): Payer: Self-pay

## 2014-10-14 MED ORDER — ONDANSETRON 4 MG PO TBDP: 4.0000 mg | ORAL_TABLET | Freq: Three times a day (TID) | ORAL | Status: DC | PRN

## 2014-10-14 MED ORDER — CEPHALEXIN 500 MG PO CAPS: 500.0000 mg | ORAL_CAPSULE | Freq: Four times a day (QID) | ORAL | Status: DC

## 2014-10-14 MED ORDER — SODIUM CHLORIDE 0.9 % IV BOLUS (SEPSIS)
1000.0000 mL | Freq: Once | INTRAVENOUS | Status: AC
  Administered 2014-10-14: 1000 mL via INTRAVENOUS

## 2014-10-14 MED ORDER — CEPHALEXIN 250 MG PO CAPS
500.0000 mg | ORAL_CAPSULE | Freq: Once | ORAL | Status: AC
  Administered 2014-10-14: 500 mg via ORAL
  Filled 2014-10-14: qty 2

## 2014-10-14 MED ORDER — ONDANSETRON HCL 4 MG/2ML IJ SOLN
4.0000 mg | Freq: Once | INTRAMUSCULAR | Status: AC
  Administered 2014-10-14: 4 mg via INTRAVENOUS
  Filled 2014-10-14: qty 2

## 2014-10-14 MED ORDER — MORPHINE SULFATE 4 MG/ML IJ SOLN
4.0000 mg | Freq: Once | INTRAMUSCULAR | Status: AC
  Administered 2014-10-14: 4 mg via INTRAVENOUS
  Filled 2014-10-14: qty 1

## 2014-10-14 MED ORDER — LIDOCAINE-EPINEPHRINE (PF) 2 %-1:200000 IJ SOLN
10.0000 mL | Freq: Once | INTRAMUSCULAR | Status: AC
  Administered 2014-10-14: 10 mL
  Filled 2014-10-14: qty 20

## 2014-10-14 MED ORDER — SULFAMETHOXAZOLE-TRIMETHOPRIM 800-160 MG PO TABS: 1.0000 | ORAL_TABLET | Freq: Two times a day (BID) | ORAL | Status: DC

## 2014-10-14 MED ORDER — SULFAMETHOXAZOLE-TRIMETHOPRIM 800-160 MG PO TABS
1.0000 | ORAL_TABLET | Freq: Once | ORAL | Status: AC
  Administered 2014-10-14: 1 via ORAL
  Filled 2014-10-14: qty 1

## 2014-10-14 MED ORDER — HYDROCODONE-ACETAMINOPHEN 5-325 MG PO TABS
1.0000 | ORAL_TABLET | Freq: Once | ORAL | Status: AC
  Administered 2014-10-14: 1 via ORAL
  Filled 2014-10-14: qty 1

## 2014-10-14 NOTE — ED Provider Notes (Signed)
CSN: [id number]     Arrival date & time 10/13/14  2055 History   First MD Initiated Contact with Patient 10/14/14 0021     Chief Complaint  Patient presents with  . Abscess  . Cellulitis   [name] is a 49 y.o. male with history of hypertension and diabetes who presents emergency department with multiple complaints including an abscess to his right hand, cough, vomiting, diarrhea, nausea and body aches for the past 4 days. The patient reports that a week and a half ago he believes he was bit by an insect. He did not see an insect bite him. Patient reports he started having swelling and increased pain starting 5 days ago. The patient reports pain is 9 out of 10 and worse with movement of his hand. Patient denies drainage from his hand. The patient has soaked his hand in warm water without relief. The patient is also complaining of a productive cough, body aches, nausea, vomiting and diarrhea for the past 4 days. Patient reports nausea currently. The patient reports he had one loose stool today. The patient reports he's vomited once today. The patient complains of generalized abdominal pain. The patient reports he is out of his blood pressure medication. The patient denies fevers, shortness of breath, wheezing, chest pain, palpitations, dysuria, hematuria, hematochezia, melena, hematemesis, or weakness.   (Consider location/radiation/quality/duration/timing/severity/associated sxs/prior Treatment) HPI  Past Medical History  Diagnosis Date  . Hypertension   . Diabetes mellitus     Type 2 DM  x 24 years  . Foreign body in left foot     with  infection   Past Surgical History  Procedure Laterality Date  . No past surgeries    . I&d extremity  04/23/2012    Procedure: IRRIGATION AND DEBRIDEMENT EXTREMITY;  Surgeon: Newt Minion, MD;  Location: [facility];  Service: Orthopedics;  Laterality: Left;  Irrigation and Debridement Left Foot  . I&d extremity  09/18/2012    Procedure: IRRIGATION AND  DEBRIDEMENT EXTREMITY;  Surgeon: Newt Minion, MD;  Location: [facility];  Service: Orthopedics;  Laterality: Left;   Family History  Problem Relation Age of Onset  . Diabetes type II Mother   . Diabetes type II Sister    History  Substance Use Topics  . Smoking status: Former Smoker    Types: Cigarettes    Quit date: 09/03/2012  . Smokeless tobacco: Former Systems developer    Quit date: 09/03/2012  . Alcohol Use: 4.8 oz/week    6 Cans of beer, 2 Shots of liquor per week     Comment: weekends.    Review of Systems  Constitutional: Negative for fever and chills.  HENT: Positive for sneezing. Negative for congestion, ear pain, nosebleeds, postnasal drip, sore throat and trouble swallowing.   Eyes: Negative for pain and visual disturbance.  Respiratory: Positive for cough. Negative for chest tightness, shortness of breath and wheezing.   Cardiovascular: Negative for chest pain and palpitations.  Gastrointestinal: Positive for nausea, vomiting, abdominal pain and diarrhea. Negative for blood in stool.  Genitourinary: Negative for dysuria, frequency and hematuria.  Musculoskeletal: Positive for arthralgias. Negative for back pain, neck pain and neck stiffness.  Skin:       Abscess to right hand  Neurological: Negative for weakness, light-headedness, numbness and headaches.  All other systems reviewed and are negative.     Allergies  Latex  Home Medications   Prior to Admission medications   Medication Sig Start Date End Date Taking? Authorizing Provider  cephALEXin (KEFLEX) 500 MG capsule Take 1 capsule (500 mg total) by mouth 4 (four) times daily. 10/14/14   Verda Cumins Caresse Sedivy, PA-C  Ibuprofen-Diphenhydramine Cit (IBUPROFEN PM) 200-38 MG TABS Take 2 tablets by mouth at bedtime as needed (for sleep and pain).    Historical Provider, MD  insulin aspart (NOVOLOG) 100 UNIT/ML injection Inject 3-6 Units into the skin 2 (two) times daily.    Historical Provider, MD  insulin aspart (NOVOLOG) 100  UNIT/ML injection Use sliding scale twice a day with meals 03/05/14   Tatyana A Kirichenko, PA-C  insulin glargine (LANTUS) 100 UNIT/ML injection Inject 0.26 mLs (26 Units total) into the skin daily. 03/29/13   Vivi Ferns, MD  insulin glargine (LANTUS) 100 UNIT/ML injection Inject 0.26 mLs (26 Units total) into the skin at bedtime. 03/05/14   Tatyana A Kirichenko, PA-C  lisinopril (PRINIVIL,ZESTRIL) 20 MG tablet Take 1 tablet (20 mg total) by mouth daily. 03/05/14   Tatyana A Kirichenko, PA-C  metFORMIN (GLUCOPHAGE) 500 MG tablet Take 500 mg by mouth 2 (two) times daily with a meal.    Historical Provider, MD  metFORMIN (GLUCOPHAGE) 500 MG tablet Take 1 tablet (500 mg total) by mouth 2 (two) times daily with a meal. 03/05/14   Tatyana A Kirichenko, PA-C  ondansetron (ZOFRAN ODT) 4 MG disintegrating tablet Take 1 tablet (4 mg total) by mouth every 8 (eight) hours as needed for nausea or vomiting. 10/14/14   Verda Cumins Solon Alban, PA-C  oxyCODONE-acetaminophen (PERCOCET) 5-325 MG per tablet Take 1 tablet by mouth every 4 (four) hours as needed for severe pain. 03/05/14   Tatyana A Kirichenko, PA-C  sulfamethoxazole-trimethoprim (SEPTRA DS) 800-160 MG per tablet Take 1 tablet by mouth every 12 (twelve) hours. 10/14/14   Verda Cumins Tunisha Ruland, PA-C   BP 142/78 mmHg  Pulse 87  Temp(Src) 99.7 F (37.6 C) (Oral)  Resp 18  Ht 5\' 11"  (1.803 m)  Wt 205 lb (92.987 kg)  BMI 28.60 kg/m2  SpO2 94% Physical Exam  Constitutional: He appears well-developed and well-nourished. No distress.  Non-toxic appearing.   HENT:  Head: Normocephalic and atraumatic.  Right Ear: External ear normal.  Left Ear: External ear normal.  Nose: Nose normal.  Mouth/Throat: Oropharynx is clear and moist. No oropharyngeal exudate.  Bilateral tympanic members are pearly-gray without erythema or loss of landmarks.  Eyes: Conjunctivae are normal. Pupils are equal, round, and reactive to light. Right eye exhibits no discharge. Left eye  exhibits no discharge.  Neck: Normal range of motion. Neck supple.  Cardiovascular: Normal rate, regular rhythm, normal heart sounds and intact distal pulses.  Exam reveals no gallop and no friction rub.   No murmur heard. Bilateral radial pulses are intact.  Pulmonary/Chest: Effort normal and breath sounds normal. No respiratory distress. He has no wheezes. He has no rales.  Patient's lungs are clear to auscultation bilaterally.  Abdominal: Soft. Bowel sounds are normal. He exhibits no distension and no mass. There is tenderness. There is no rebound and no guarding.  Abdomen is soft. Bowel sounds are present. Patient's abdomen is generally tender to palpation. No rebound tenderness. No McBurney's point tenderness. Negative Rovsing sign. Negative psoas and obturator sign.  Musculoskeletal: Normal range of motion. He exhibits edema and tenderness.  Patient has an abscess to the dorsal aspect of his right hand. Patient has normal range of motion of his right hand and wrist. Patient has good strength in his hand. The patient has equal grip strengths bilaterally.  Lymphadenopathy:  He has no cervical adenopathy.  Neurological: He is alert. Coordination normal.  Skin: Skin is warm and dry. No rash noted. He is not diaphoretic. There is erythema. No pallor.  The patient has an abscess on the dorsal aspect of his right hand. There is overlying erythema. There is no red streaking or surrounding erythema.   Psychiatric: He has a normal mood and affect. His behavior is normal.  Nursing note and vitals reviewed.   ED Course  INCISION AND DRAINAGE Date/Time: 10/14/2014 1:50 AM Performed by: Hanley Hays Authorized by: Hanley Hays Consent: Verbal consent obtained. Risks and benefits: risks, benefits and alternatives were discussed Consent given by: patient Patient understanding: patient states understanding of the procedure being performed Patient consent: the patient's  understanding of the procedure matches consent given Procedure consent: procedure consent matches procedure scheduled Relevant documents: relevant documents present and verified Test results: test results available and properly labeled Site marked: the operative site was marked Imaging studies: imaging studies available Required items: required blood products, implants, devices, and special equipment available Patient identity confirmed: verbally with patient Time out: Immediately prior to procedure a "time out" was called to verify the correct patient, procedure, equipment, support staff and site/side marked as required. Type: abscess Body area: upper extremity Location details: right hand Anesthesia: local infiltration Local anesthetic: lidocaine 2% with epinephrine Anesthetic total: 1 ml Patient sedated: no Scalpel size: 11 Incision type: single straight Complexity: simple Drainage: purulent Drainage amount: moderate Wound treatment: wound left open Patient tolerance: Patient tolerated the procedure well with no immediate complications   (including critical care time) Labs Review Labs Reviewed  COMPREHENSIVE METABOLIC PANEL - Abnormal; Notable for the following:    Sodium 134 (*)    Glucose, Bld 389 (*)    Albumin 3.3 (*)    GFR calc non Af Amer 78 (*)    All other components within normal limits  CBC WITH DIFFERENTIAL - Abnormal; Notable for the following:    MCHC 36.5 (*)    Platelets 146 (*)    All other components within normal limits  CBG MONITORING, ED - Abnormal; Notable for the following:    Glucose-Capillary 383 (*)    All other components within normal limits  PROTIME-INR    Imaging Review Dg Chest 2 View  10/14/2014   CLINICAL DATA:  Abscess at the right hand, with erythema and tenderness. Initial encounter.  EXAM: CHEST  2 VIEW  COMPARISON:  Chest radiograph performed 08/25/2012  FINDINGS: The lungs are well-aerated and clear. There is no evidence of focal  opacification, pleural effusion or pneumothorax.  The heart is normal in size; the mediastinal contour is within normal limits. No acute osseous abnormalities are seen.  IMPRESSION: No acute cardiopulmonary process seen.   Electronically Signed   By: Garald Balding M.D.   On: 10/14/2014 01:28   Dg Hand Complete Right  10/14/2014   CLINICAL DATA:  Abscess to the right hand. Red, warm, tender to palpation, and heart. History of type 1 diabetes. Cellulitis.  EXAM: RIGHT HAND - COMPLETE 3+ VIEW  COMPARISON:  None.  FINDINGS: Old fracture deformity of the right fifth metacarpal bone. Prominent soft tissue swelling along the dorsum of the right hand. No radiopaque foreign bodies or gas collections demonstrated in the soft tissues. No focal bone erosion or cortical loss to suggest osteomyelitis. No evidence of acute fracture or dislocation in the right hand.  IMPRESSION: Diffuse soft tissue swelling over the dorsum of the right hand.  No radiopaque foreign body or gas collection. No evidence of osteomyelitis. No acute bony abnormalities.   Electronically Signed   By: Lucienne Capers M.D.   On: 10/14/2014 01:29     EKG Interpretation None      Filed Vitals:   10/13/14 2106 10/13/14 2359 10/14/14 0001 10/14/14 0227  BP: 184/96  175/85 142/78  Pulse: 108  91 87  Temp: 98.8 F (37.1 C) 99.2 F (37.3 C) 99.4 F (37.4 C) 99.7 F (37.6 C)  TempSrc: Oral Oral Oral Oral  Resp: 16  18 18   Height: 5\' 11"  (1.803 m)     Weight: 205 lb (92.987 kg)     SpO2: 98%  100% 94%     MDM   Meds given in ED:  Medications  sodium chloride 0.9 % bolus 1,000 mL (0 mLs Intravenous Stopped 10/14/14 0210)  morphine 4 MG/ML injection 4 mg (4 mg Intravenous Given 10/14/14 0055)  ondansetron (ZOFRAN) injection 4 mg (4 mg Intravenous Given 10/14/14 0056)  lidocaine-EPINEPHrine (XYLOCAINE W/EPI) 2 %-1:200000 (PF) injection 10 mL (10 mLs Infiltration Given 10/14/14 0144)  HYDROcodone-acetaminophen (NORCO/VICODIN) 5-325 MG per  tablet 1 tablet (1 tablet Oral Given 10/14/14 0222)  sulfamethoxazole-trimethoprim (BACTRIM DS,SEPTRA DS) 800-160 MG per tablet 1 tablet (1 tablet Oral Given 10/14/14 0244)  cephALEXin (KEFLEX) capsule 500 mg (500 mg Oral Given 10/14/14 0244)    New Prescriptions   CEPHALEXIN (KEFLEX) 500 MG CAPSULE    Take 1 capsule (500 mg total) by mouth 4 (four) times daily.   ONDANSETRON (ZOFRAN ODT) 4 MG DISINTEGRATING TABLET    Take 1 tablet (4 mg total) by mouth every 8 (eight) hours as needed for nausea or vomiting.   SULFAMETHOXAZOLE-TRIMETHOPRIM (SEPTRA DS) 800-160 MG PER TABLET    Take 1 tablet by mouth every 12 (twelve) hours.    Final diagnoses:  Abscess of hand, right  Nausea vomiting and diarrhea  Cough   This is a 49 year old male with a history of diabetes and hypertension who presented to the emergency department with multiple complaints including an abscess to his right hand, body aches, nausea, vomiting, diarrhea and cough for the past 4 days. The patient reports he has had one loose stool and has vomited once today. The patient has an abscess to the dorsal aspect of his right hand. There is overlying erythema but no surrounding erythema or red streaking.  The patient's lungs are clear to auscultation bilaterally. Patient has very mild general abdominal tenderness to palpation. Patient's abdomen is soft and bowel sounds are present. Negative psoas and obturator sign. Negative Rovsing sign.  The patient's CMP is remarkable for glucose of 389. The patient's CBC is unremarkable. The patient's right hand x-ray indicated no evidence of osteomyelitis or acute bony abnormalities. It indicated no radiopaque foreign body or gas collection. The patient's chest x-ray is unremarkable.  Incision and drainage of the patient's right hand abscess performed by me and the patient tolerated this well. The patient has not been coughing in the room, and appears in no distress. Patient is afebrile and nontoxic appearing.  Prior to discharge the patient is tolerating peanut butter crackers and water. The patient also tolerated Norco, Bactrim and Keflex prior to discharge. The patient is provided prescription for Zofran, Bactrim and Keflex. Education provided on wound care after an incision and drainage. Strict return precautions were provided. I advised the patient to follow-up with their primary care provider this week. I advised the patient to return to the emergency department with  new or worsening symptoms or new concerns. The patient verbalized understanding and agreement with plan.   This patient was discussed with and evaluated by Dr. Eulis Foster who agrees with assessment and plan.    Hanley Hays, PA-C 10/14/14 [id number]  Richarda Blade, MD 10/14/14 [phone]

## 2014-10-14 NOTE — Discharge Instructions (Signed)
Abscess An abscess is an infected area that contains a collection of pus and debris.It can occur in almost any part of the body. An abscess is also known as a furuncle or boil. CAUSES  An abscess occurs when tissue gets infected. This can occur from blockage of oil or sweat glands, infection of hair follicles, or a minor injury to the skin. As the body tries to fight the infection, pus collects in the area and creates pressure under the skin. This pressure causes pain. People with weakened immune systems have difficulty fighting infections and get certain abscesses more often.  SYMPTOMS Usually an abscess develops on the skin and becomes a painful mass that is red, warm, and tender. If the abscess forms under the skin, you may feel a moveable soft area under the skin. Some abscesses break open (rupture) on their own, but most will continue to get worse without care. The infection can spread deeper into the body and eventually into the bloodstream, causing you to feel ill.  DIAGNOSIS  Your caregiver will take your medical history and perform a physical exam. A sample of fluid may also be taken from the abscess to determine what is causing your infection. TREATMENT  Your caregiver may prescribe antibiotic medicines to fight the infection. However, taking antibiotics alone usually does not cure an abscess. Your caregiver may need to make a small cut (incision) in the abscess to drain the pus. In some cases, gauze is packed into the abscess to reduce pain and to continue draining the area. HOME CARE INSTRUCTIONS   Only take over-the-counter or prescription medicines for pain, discomfort, or fever as directed by your caregiver.  If you were prescribed antibiotics, take them as directed. Finish them even if you start to feel better.  If gauze is used, follow your caregiver's directions for changing the gauze.  To avoid spreading the infection:  Keep your draining abscess covered with a  bandage.  Wash your hands well.  Do not share personal care items, towels, or whirlpools with others.  Avoid skin contact with others.  Keep your skin and clothes clean around the abscess.  Keep all follow-up appointments as directed by your caregiver. SEEK MEDICAL CARE IF:   You have increased pain, swelling, redness, fluid drainage, or bleeding.  You have muscle aches, chills, or a general ill feeling.  You have a fever. MAKE SURE YOU:   Understand these instructions.  Will watch your condition.  Will get help right away if you are not doing well or get worse. Document Released: 07/03/2005 Document Revised: 03/24/2012 Document Reviewed: 12/06/2011 [facility] Patient Information 2015 [place], [place]. This information is not intended to replace advice given to you by your health care provider. Make sure you discuss any questions you have with your health care provider. Nausea and Vomiting Nausea is a sick feeling that often comes before throwing up (vomiting). Vomiting is a reflex where stomach contents come out of your mouth. Vomiting can cause severe loss of body fluids (dehydration). Children and elderly adults can become dehydrated quickly, especially if they also have diarrhea. Nausea and vomiting are symptoms of a condition or disease. It is important to find the cause of your symptoms. CAUSES   Direct irritation of the stomach lining. This irritation can result from increased acid production (gastroesophageal reflux disease), infection, food poisoning, taking certain medicines (such as nonsteroidal anti-inflammatory drugs), alcohol use, or tobacco use.  Signals from the brain.These signals could be caused by a headache, heat exposure,  an inner ear disturbance, increased pressure in the brain from injury, infection, a tumor, or a concussion, pain, emotional stimulus, or metabolic problems.  An obstruction in the gastrointestinal tract (bowel obstruction).  Illnesses such as  diabetes, hepatitis, gallbladder problems, appendicitis, kidney problems, cancer, sepsis, atypical symptoms of a heart attack, or eating disorders.  Medical treatments such as chemotherapy and radiation.  Receiving medicine that makes you sleep (general anesthetic) during surgery. DIAGNOSIS Your caregiver may ask for tests to be done if the problems do not improve after a few days. Tests may also be done if symptoms are severe or if the reason for the nausea and vomiting is not clear. Tests may include:  Urine tests.  Blood tests.  Stool tests.  Cultures (to look for evidence of infection).  X-rays or other imaging studies. Test results can help your caregiver make decisions about treatment or the need for additional tests. TREATMENT You need to stay well hydrated. Drink frequently but in small amounts.You may wish to drink water, sports drinks, clear broth, or eat frozen ice pops or gelatin dessert to help stay hydrated.When you eat, eating slowly may help prevent nausea.There are also some antinausea medicines that may help prevent nausea. HOME CARE INSTRUCTIONS   Take all medicine as directed by your caregiver.  If you do not have an appetite, do not force yourself to eat. However, you must continue to drink fluids.  If you have an appetite, eat a normal diet unless your caregiver tells you differently.  Eat a variety of complex carbohydrates (rice, wheat, potatoes, bread), lean meats, yogurt, fruits, and vegetables.  Avoid high-fat foods because they are more difficult to digest.  Drink enough water and fluids to keep your urine clear or pale yellow.  If you are dehydrated, ask your caregiver for specific rehydration instructions. Signs of dehydration may include:  Severe thirst.  Dry lips and mouth.  Dizziness.  Dark urine.  Decreasing urine frequency and amount.  Confusion.  Rapid breathing or pulse. SEEK IMMEDIATE MEDICAL CARE IF:   You have blood or brown  flecks (like coffee grounds) in your vomit.  You have black or bloody stools.  You have a severe headache or stiff neck.  You are confused.  You have severe abdominal pain.  You have chest pain or trouble breathing.  You do not urinate at least once every 8 hours.  You develop cold or clammy skin.  You continue to vomit for longer than 24 to 48 hours.  You have a fever. MAKE SURE YOU:   Understand these instructions.  Will watch your condition.  Will get help right away if you are not doing well or get worse. Document Released: 09/23/2005 Document Revised: 12/16/2011 Document Reviewed: 02/20/2011 [facility] Patient Information 2015 [place], [place]. This information is not intended to replace advice given to you by your health care provider. Make sure you discuss any questions you have with your health care provider. Diarrhea Diarrhea is frequent loose and watery bowel movements. It can cause you to feel weak and dehydrated. Dehydration can cause you to become tired and thirsty, have a dry mouth, and have decreased urination that often is dark yellow. Diarrhea is a sign of another problem, most often an infection that will not last long. In most cases, diarrhea typically lasts 2-3 days. However, it can last longer if it is a sign of something more serious. It is important to treat your diarrhea as directed by your caregiver to lessen or prevent future  episodes of diarrhea. CAUSES  Some common causes include:  Gastrointestinal infections caused by viruses, bacteria, or parasites.  Food poisoning or food allergies.  Certain medicines, such as antibiotics, chemotherapy, and laxatives.  Artificial sweeteners and fructose.  Digestive disorders. HOME CARE INSTRUCTIONS  Ensure adequate fluid intake (hydration): Have 1 cup (8 oz) of fluid for each diarrhea episode. Avoid fluids that contain simple sugars or sports drinks, fruit juices, whole milk products, and sodas. Your urine  should be clear or pale yellow if you are drinking enough fluids. Hydrate with an oral rehydration solution that you can purchase at pharmacies, retail stores, and online. You can prepare an oral rehydration solution at home by mixing the following ingredients together:   - tsp table salt.   tsp baking soda.   tsp salt substitute containing potassium chloride.  1  tablespoons sugar.  1 L (34 oz) of water.  Certain foods and beverages may increase the speed at which food moves through the gastrointestinal (GI) tract. These foods and beverages should be avoided and include:  Caffeinated and alcoholic beverages.  High-fiber foods, such as raw fruits and vegetables, nuts, seeds, and whole grain breads and cereals.  Foods and beverages sweetened with sugar alcohols, such as xylitol, sorbitol, and mannitol.  Some foods may be well tolerated and may help thicken stool including:  Starchy foods, such as rice, toast, pasta, low-sugar cereal, oatmeal, grits, baked potatoes, crackers, and bagels.  Bananas.  Applesauce.  Add probiotic-rich foods to help increase healthy bacteria in the GI tract, such as yogurt and fermented milk products.  Wash your hands well after each diarrhea episode.  Only take over-the-counter or prescription medicines as directed by your caregiver.  Take a warm bath to relieve any burning or pain from frequent diarrhea episodes. SEEK IMMEDIATE MEDICAL CARE IF:   You are unable to keep fluids down.  You have persistent vomiting.  You have blood in your stool, or your stools are black and tarry.  You do not urinate in 6-8 hours, or there is only a small amount of very dark urine.  You have abdominal pain that increases or localizes.  You have weakness, dizziness, confusion, or light-headedness.  You have a severe headache.  Your diarrhea gets worse or does not get better.  You have a fever or persistent symptoms for more than 2-3 days.  You have a  fever and your symptoms suddenly get worse. MAKE SURE YOU:   Understand these instructions.  Will watch your condition.  Will get help right away if you are not doing well or get worse. Document Released: 09/13/2002 Document Revised: 02/07/2014 Document Reviewed: 05/31/2012 [facility] Patient Information 2015 [place], [place]. This information is not intended to replace advice given to you by your health care provider. Make sure you discuss any questions you have with your health care provider. Cough, Adult  A cough is a reflex that helps clear your throat and airways. It can help heal the body or may be a reaction to an irritated airway. A cough may only last 2 or 3 weeks (acute) or may last more than 8 weeks (chronic).  CAUSES Acute cough:  Viral or bacterial infections. Chronic cough:  Infections.  Allergies.  Asthma.  Post-nasal drip.  Smoking.  Heartburn or acid reflux.  Some medicines.  Chronic lung problems (COPD).  Cancer. SYMPTOMS   Cough.  Fever.  Chest pain.  Increased breathing rate.  High-pitched whistling sound when breathing (wheezing).  Colored mucus that you cough  up (sputum). TREATMENT   A bacterial cough may be treated with antibiotic medicine.  A viral cough must run its course and will not respond to antibiotics.  Your caregiver may recommend other treatments if you have a chronic cough. HOME CARE INSTRUCTIONS   Only take over-the-counter or prescription medicines for pain, discomfort, or fever as directed by your caregiver. Use cough suppressants only as directed by your caregiver.  Use a cold steam vaporizer or humidifier in your bedroom or home to help loosen secretions.  Sleep in a semi-upright position if your cough is worse at night.  Rest as needed.  Stop smoking if you smoke. SEEK IMMEDIATE MEDICAL CARE IF:   You have pus in your sputum.  Your cough starts to worsen.  You cannot control your cough with suppressants and  are losing sleep.  You begin coughing up blood.  You have difficulty breathing.  You develop pain which is getting worse or is uncontrolled with medicine.  You have a fever. MAKE SURE YOU:   Understand these instructions.  Will watch your condition.  Will get help right away if you are not doing well or get worse. Document Released: 03/22/2011 Document Revised: 12/16/2011 Document Reviewed: 03/22/2011 [facility] Patient Information 2015 [place], [place]. This information is not intended to replace advice given to you by your health care provider. Make sure you discuss any questions you have with your health care provider.

## 2014-10-28 ENCOUNTER — Encounter (HOSPITAL_COMMUNITY): Payer: Self-pay

## 2014-10-28 ENCOUNTER — Inpatient Hospital Stay (HOSPITAL_BASED_OUTPATIENT_CLINIC_OR_DEPARTMENT_OTHER): Admit: 2014-10-28 | Payer: Self-pay

## 2014-10-28 ENCOUNTER — Inpatient Hospital Stay
Admission: EM | Admit: 2014-10-28 | Discharge: 2014-10-30 | DRG: 185 | Disposition: A | Discharge status: Home / Self Care | Payer: Worker's Comp, Other unspecified | Attending: Surgery | Admitting: Surgery

## 2014-10-28 ENCOUNTER — Encounter (HOSPITAL_BASED_OUTPATIENT_CLINIC_OR_DEPARTMENT_OTHER): Payer: Self-pay

## 2014-10-28 ENCOUNTER — Other Ambulatory Visit (HOSPITAL_BASED_OUTPATIENT_CLINIC_OR_DEPARTMENT_OTHER): Payer: Self-pay

## 2014-10-28 DIAGNOSIS — R40241 Glasgow coma scale score 13-15: Secondary | ICD-10-CM | POA: Diagnosis present

## 2014-10-28 DIAGNOSIS — Y9289 Other specified places as the place of occurrence of the external cause: Secondary | ICD-10-CM

## 2014-10-28 DIAGNOSIS — E119 Type 2 diabetes mellitus without complications: Secondary | ICD-10-CM | POA: Diagnosis present

## 2014-10-28 DIAGNOSIS — S2232XA Fracture of one rib, left side, initial encounter for closed fracture: Secondary | ICD-10-CM

## 2014-10-28 DIAGNOSIS — M25562 Pain in left knee: Secondary | ICD-10-CM

## 2014-10-28 DIAGNOSIS — N289 Disorder of kidney and ureter, unspecified: Secondary | ICD-10-CM | POA: Diagnosis present

## 2014-10-28 DIAGNOSIS — Y93H3 Activity, building and construction: Secondary | ICD-10-CM

## 2014-10-28 DIAGNOSIS — K8020 Calculus of gallbladder without cholecystitis without obstruction: Secondary | ICD-10-CM | POA: Diagnosis present

## 2014-10-28 DIAGNOSIS — M79605 Pain in left leg: Secondary | ICD-10-CM

## 2014-10-28 DIAGNOSIS — M25572 Pain in left ankle and joints of left foot: Secondary | ICD-10-CM

## 2014-10-28 DIAGNOSIS — W19XXXA Unspecified fall, initial encounter: Secondary | ICD-10-CM

## 2014-10-28 DIAGNOSIS — M549 Dorsalgia, unspecified: Secondary | ICD-10-CM | POA: Diagnosis present

## 2014-10-28 DIAGNOSIS — S199XXA Unspecified injury of neck, initial encounter: Secondary | ICD-10-CM

## 2014-10-28 DIAGNOSIS — S2243XA Multiple fractures of ribs, bilateral, initial encounter for closed fracture: Principal | ICD-10-CM | POA: Diagnosis present

## 2014-10-28 DIAGNOSIS — E875 Hyperkalemia: Secondary | ICD-10-CM | POA: Diagnosis present

## 2014-10-28 DIAGNOSIS — S0990XA Unspecified injury of head, initial encounter: Secondary | ICD-10-CM

## 2014-10-28 DIAGNOSIS — R911 Solitary pulmonary nodule: Secondary | ICD-10-CM | POA: Diagnosis present

## 2014-10-28 DIAGNOSIS — I129 Hypertensive chronic kidney disease with stage 1 through stage 4 chronic kidney disease, or unspecified chronic kidney disease: Secondary | ICD-10-CM | POA: Diagnosis present

## 2014-10-28 DIAGNOSIS — S2239XS Fracture of one rib, unspecified side, sequela: Secondary | ICD-10-CM

## 2014-10-28 DIAGNOSIS — N189 Chronic kidney disease, unspecified: Secondary | ICD-10-CM | POA: Diagnosis present

## 2014-10-28 DIAGNOSIS — S3981XA Other specified injuries of abdomen, initial encounter: Secondary | ICD-10-CM

## 2014-10-28 DIAGNOSIS — W1789XA Other fall from one level to another, initial encounter: Secondary | ICD-10-CM | POA: Diagnosis present

## 2014-10-28 DIAGNOSIS — Y990 Civilian activity done for income or pay: Secondary | ICD-10-CM

## 2014-10-28 DIAGNOSIS — R102 Pelvic and perineal pain: Secondary | ICD-10-CM

## 2014-10-28 DIAGNOSIS — S2239XA Fracture of one rib, unspecified side, initial encounter for closed fracture: Secondary | ICD-10-CM | POA: Diagnosis present

## 2014-10-28 HISTORY — DX: Type 2 diabetes mellitus without complications (CMS-HCC): E11.9

## 2014-10-28 LAB — BASIC METABOLIC PANEL, BLOOD
Anion Gap: 14 mmol/L (ref 7–15)
BUN: 29 mg/dL — ABNORMAL HIGH (ref 6–20)
Bicarbonate: 21 mmol/L — ABNORMAL LOW (ref 22–29)
Calcium: 8.6 mg/dL (ref 8.5–10.6)
Chloride: 99 mmol/L (ref 98–107)
Creatinine: 1.71 mg/dL — ABNORMAL HIGH (ref 0.67–1.17)
GFR: 43 mL/min
Glucose: 293 mg/dL — ABNORMAL HIGH (ref 70–115)
Potassium: 5.2 mmol/L — ABNORMAL HIGH (ref 3.5–5.1)
Sodium: 134 mmol/L — ABNORMAL LOW (ref 136–145)

## 2014-10-28 LAB — CBC WITH DIFF, BLOOD
ANC-Automated: 10.7 10*3/uL — ABNORMAL HIGH (ref 1.6–7.0)
Abs Basophils: 0.1 10*3/uL (ref <0.1)
Abs Eosinophils: 0.3 10*3/uL (ref 0.1–0.7)
Abs Lymphs: 1.3 10*3/uL (ref 0.8–3.1)
Abs Monos: 0.8 10*3/uL (ref 0.2–0.8)
Eosinophils: 2 % (ref 1–4)
Hct: 38.4 % — ABNORMAL LOW (ref 40.0–50.0)
Hgb: 14.1 gm/dL (ref 13.7–17.5)
Imm Gran %: 1 % (ref <1)
Imm Gran Abs: 0.1 10*3/uL (ref <0.1)
Lymphocytes: 10 % — ABNORMAL LOW (ref 19–53)
MCH: 33 pg — ABNORMAL HIGH (ref 26.0–32.0)
MCHC: 36.7 % — ABNORMAL HIGH (ref 32.0–36.0)
MCV: 89.9 um3 (ref 79.0–95.0)
MPV: 12.6 fL — ABNORMAL HIGH (ref 9.4–12.4)
Monocytes: 6 % (ref 5–12)
Plt Count: 218 10*3/uL (ref 140–370)
RBC: 4.27 10*6/uL — ABNORMAL LOW (ref 4.60–6.10)
RDW: 12.1 % (ref 12.0–14.0)
Segs: 81 % — ABNORMAL HIGH (ref 34–71)
WBC: 13.2 10*3/uL — ABNORMAL HIGH (ref 4.0–10.0)

## 2014-10-28 MED ORDER — ACETAMINOPHEN 325 MG PO TABS
650.0000 mg | ORAL_TABLET | Freq: Four times a day (QID) | ORAL | Status: DC
Start: 2014-10-29 — End: 2014-10-30
  Administered 2014-10-29 – 2014-10-30 (×6): 650 mg via ORAL
  Filled 2014-10-28 (×6): qty 2

## 2014-10-28 MED ORDER — FAMOTIDINE IN NACL 20 MG/50ML IV SOLN
20.0000 mg | Freq: Two times a day (BID) | INTRAVENOUS | Status: DC
Start: 2014-10-29 — End: 2014-10-29
  Filled 2014-10-28: qty 50

## 2014-10-28 MED ORDER — SODIUM CHLORIDE 0.9 % IV BOLUS
1000.00 mL | INJECTION | Freq: Once | INTRAVENOUS | Status: AC
Start: 2014-10-28 — End: 2014-10-28
  Administered 2014-10-28: 1000 mL via INTRAVENOUS

## 2014-10-28 MED ORDER — GLUCAGON HCL (RDNA) 1 MG IJ SOLR
1.0000 mg | Freq: Once | INTRAMUSCULAR | Status: DC | PRN
Start: 2014-10-28 — End: 2014-10-30

## 2014-10-28 MED ORDER — PATCH REMOVAL
Status: DC
Start: 2014-10-29 — End: 2014-10-29

## 2014-10-28 MED ORDER — OXYCODONE HCL 5 MG OR TABS
15.0000 mg | ORAL_TABLET | ORAL | Status: DC | PRN
Start: 2014-10-28 — End: 2014-10-29
  Administered 2014-10-29 (×2): 15 mg via ORAL
  Filled 2014-10-28 (×2): qty 1

## 2014-10-28 MED ORDER — ONDANSETRON HCL 4 MG/2ML IV SOLN
4.0000 mg | Freq: Four times a day (QID) | INTRAMUSCULAR | Status: DC | PRN
Start: 2014-10-28 — End: 2014-10-30

## 2014-10-28 MED ORDER — SENNA 8.6 MG OR TABS
2.0000 | ORAL_TABLET | Freq: Every morning | ORAL | Status: DC
Start: 2014-10-29 — End: 2014-10-30
  Administered 2014-10-29 – 2014-10-30 (×2): 17.2 mg via ORAL
  Filled 2014-10-28 (×2): qty 2

## 2014-10-28 MED ORDER — INSULIN REGULAR IV BOLUS FROM BAG
1.0000 [IU] | Freq: Once | INTRAMUSCULAR | Status: DC | PRN
Start: 2014-10-28 — End: 2014-10-29

## 2014-10-28 MED ORDER — DEXTROSE (DIABETIC USE) 40 % OR GEL
1.0000 | ORAL | Status: DC | PRN
Start: 2014-10-28 — End: 2014-10-30

## 2014-10-28 MED ORDER — DEXTROSE 50 % IV SOLN
12.5000 g | INTRAVENOUS | Status: DC | PRN
Start: 2014-10-28 — End: 2014-10-30

## 2014-10-28 MED ORDER — HYDROMORPHONE HCL 1 MG/ML IJ SOLN
1.00 mg | Freq: Once | INTRAMUSCULAR | Status: AC
Start: 2014-10-28 — End: 2014-10-28
  Administered 2014-10-28: 1 mg via INTRAVENOUS
  Filled 2014-10-28: qty 1

## 2014-10-28 MED ORDER — GLUCOSE 4 GM PO CHEW (CUSTOM)
4.0000 | CHEWABLE_TABLET | ORAL | Status: DC | PRN
Start: 2014-10-28 — End: 2014-10-30

## 2014-10-28 MED ORDER — SODIUM CHLORIDE 0.9 % IV SOLN
INTRAVENOUS | Status: DC
Start: 2014-10-28 — End: 2014-10-29
  Filled 2014-10-28: qty 100

## 2014-10-28 MED ORDER — NALOXONE HCL 0.4 MG/ML IJ SOLN
0.1000 mg | INTRAMUSCULAR | Status: DC | PRN
Start: 2014-10-28 — End: 2014-10-28

## 2014-10-28 MED ORDER — INSULIN LISPRO (HUMAN) 100 UNIT/ML SC SOLN (CUSTOM)
1.0000 [IU] | Freq: Four times a day (QID) | INTRAMUSCULAR | Status: DC
Start: 2014-10-29 — End: 2014-10-29

## 2014-10-28 MED ORDER — LIDOCAINE 5 % EX PTCH
1.0000 | MEDICATED_PATCH | CUTANEOUS | Status: DC
Start: 2014-10-28 — End: 2014-10-29
  Filled 2014-10-28: qty 1

## 2014-10-28 MED ORDER — HYDROMORPHONE PCA 0.2 MG/ML SYRINGE
INTRAMUSCULAR | Status: DC
Start: 2014-10-28 — End: 2014-10-28

## 2014-10-28 MED ORDER — HYDROMORPHONE HCL 1 MG/ML IJ SOLN
1.0000 mg | INTRAMUSCULAR | Status: DC | PRN
Start: 2014-10-28 — End: 2014-10-29

## 2014-10-28 MED ORDER — OXYCODONE HCL 10 MG OR TABS
10.0000 mg | ORAL_TABLET | ORAL | Status: DC | PRN
Start: 2014-10-28 — End: 2014-10-29

## 2014-10-28 MED ORDER — SODIUM CHLORIDE 0.9 % IV SOLN
INTRAVENOUS | Status: DC
Start: 2014-10-28 — End: 2014-10-29

## 2014-10-28 NOTE — ED Notes (Signed)
2045: awake in bed in nad and w/o complaints  L knee pain 3/10  Cont to wear ccollar  Awaiting resu;ts

## 2014-10-28 NOTE — ED MD Progress Note (Signed)
Labs Reviewed   CBC WITH ADIFF, BLOOD - Abnormal; Notable for the following:     WBC 13.2 (*)     RBC 4.27 (*)     Hct 38.4 (*)     MCH 33.0 (*)     MCHC 36.7 (*)     MPV 12.6 (*)     Segs 81 (*)     Lymphocytes 10 (*)     ANC-Automated 10.7 (*)     All other components within normal limits   BASIC METABOLIC PANEL, BLOOD - Abnormal; Notable for the following:     Glucose 293 (*)     BUN 29 (*)     Creatinine 1.71 (*)     Sodium 134 (*)     Potassium 5.2 (*)     Bicarbonate 21 (*)     All other components within normal limits   URINALYSIS     cre elevated but unsure if this is baseline.  CT chest pos for multiple rib fractures.  Neg head ct, c-spine ct.  Neg A/P CT.  CT chest (fifth through tenth  ribs on the right and ninth and eleventh ribs on the left).  FAST neg.  Plan to Admit to trauma IMU.

## 2014-10-28 NOTE — ED Floor Report (Addendum)
ED to IP Handoff    Larry Arnold is a 49 year old male    Chief Complaint   Patient presents with    Back Injury     s/p fall from 7 feet onto back onto cement.  c/o back pain, chest pain and l leg pain, received morphine en route.  trauma resource       Admitted for: multiple rib fx    Code Status:  Please refer to In-pt admitting doctors orders     Past Medical History   Diagnosis Date    DM (diabetes mellitus)        No past surgical history on file.    Allergies: Review of patient's allergies indicates no known allergies.    Level of Care : IMU    ED Fall Risk: No    Skin issues:  no    >> If yes, note areas of skin breakdown. See appropriate photos.      Ambulatory:  no    Sitter needed: no    Suicide Risk :  no    Isolation Required: no     >> If yes , what type of isolation:    Is patient in custody?  no    Is patient in restraints? no    Is patient on Heparin? 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  C/o chest and LLE pain  A,ox4, mae, voiding  Past med history: DM, HTN  Meds: metformin, insulin,  No HTN meds  NKDA      See  RN SHIFT ASSESSMENT ADULT  for full assessment, exceptions will be listed.    Cardiac rhythm:  NSR    Oxygen Delivery: None    Filed Vitals:    10/28/14 1614 10/28/14 1946   BP: 163/96 140/108   Pulse: 91 96   Temp: 97.8 F (36.6 C)    Resp: 100 93   Height:  (1.803 m)    Weight: 92.987 kg (205 lb)    SpO2: 18% 98%       Lab Results   Component Value Date    WBC 13.2* 10/28/2014    RBC 4.27* 10/28/2014    HGB 14.1 10/28/2014    HCT 38.4* 10/28/2014    MCV 89.9 10/28/2014    MCHC 36.7* 10/28/2014    RDW 12.1 10/28/2014    PLT 218 10/28/2014    MPV 12.6* 10/28/2014       Lab Results   Component Value Date    NA 134* 10/28/2014    K 5.2* 10/28/2014    CL 99 10/28/2014      BICARB 21* 10/28/2014    BUN 29* 10/28/2014    CREAT 1.71* 10/28/2014    GLU 293* 10/28/2014    [place] 8.6 10/28/2014       No results found for: BNP, PHOS, MG, LACTATE, AMMONIA, IONCA, ARTIONCA    No results found for: CPK, CKMBH, TROPONIN    No results found for: PH, PCO2, O2CONTENT, IVHC3, IVBE, O2SAT, UNPH, UNPCO2, ARTPH, ARTPCO2, ARTO2CNT, IAHC3, IABE, ARTO2SAT, UNAPH, UNAPCO2    No results found for this visit on 10/28/14.  RADIOLOGIC STUDIES NOT COMPLETED:   Chest CT/x rays:  Left 9th thru 11th rib fractures                                Right 9 and 11 rib Fx  Head and neck CT were negative  Left knee/ankle were negative(none unless otherwise noted)    Active PICC Line / CVC Line / PIV Line / Drain / Airway / Intraosseous Line / Epidural Line / ART Line / Line Type / Wound     Name: Placement date: Placement time: Site: Days:    Peripheral IV - 20 G Left Hand   1633  Hand              Any significant events and interventions with responses:  Dilaudid for pain control, works well      Floor nurse informed that report is ready for review.  Opportunity to answer questions with floor RN face to face or by phone. ER number is [phone] . ER RN Dois Davenportrturo Kairen Hallinan to be contacted for any questions.    Has this report been updated?  yes  By Who:   Time:   Additional information by updated user:

## 2014-10-28 NOTE — ED Provider Notes (Signed)
Emergency Dept Note    Chief Complaint:   Chief Complaint   Patient presents with    Back Injury     s/p fall from 7 feet onto back onto cement.  c/o back pain, chest pain and l leg pain, received morphine en route.  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 The patient is complaining of back pain as well as left leg pain and anterior chest pain. The patient denies any shortness of breath. The patient denies any abdominal pain. The patient says he is having no loss of bladder or bowel symptoms. The patient is unsure if he hit his head and does not think that he lost consciousness. The patient has no numbness or tingling in his feet.       What To Do With Your Medications      Notice     You have not been prescribed any medications.          Allergies: Review of patient's allergies indicates no known allergies.    Past Medical and Surgical History:   Past Medical History   Diagnosis Date    DM (diabetes mellitus)      No past surgical history on file.    Family History:   No blood dyscrasias    Social History:   No etoh      ROS:  As per HPI, unless noted below   Review of systems:   Gen (-) fever or chills  Neuro (-) headache, (-) arm/leg weakness, (-) difficulty speaking/walking   ENT (-) sore throat or nasal discharge   Eyes (-) blurry vision or floaters   Resp (-) cough or pleuritic pain   GI (-) abd pain, (-) vomiting   CV (+) chest pain or trauma  GU (-) dysuria or hematura  Musculoskeletal (+) neck pain, (+) back pain   Skin (-) rashes or blisters        Physical exam  Vital signs reviewed and noted -    10/28/14  1614   BP: 163/96   Pulse: 91   Temp: 97.8 F (36.6 C)   Resp: 100   SpO2: 18%     Physical Exam:  General Appearance:    Neuro: GCS 15, motor and sensation grossly intact in all extremities   Head: NCAT  Eyes: conjunctivae and corneas clear; PERRL  (4 to 2), EOM's intact.   Ears: normal TMs and canal.  Nose: normal.  Mouth: normal; no malocclusion  Neck: NTTP, no step offs or deformities  Back: + thoracic spinal tenderness, + lumbar spinal tenderness, no step offs, no lacerations or abrasions  Heart: normal rate and regular rhythm, ttp over anterior chest  Lungs: clear to auscultation bilaterally, no chest deformities noted.  Abdomen: soft, non-tender, ND.  No masses or organomegaly.  Pelvis: ttp over right side  MSK: FROM of all extremities with pain on rom of LLE, contusion over left shin, ttp over left ankle and knee, no deformities seen.  Vascular: palpable radial, femoral, pedal pulses bilaterally  Skin: no laceration  Gu: no blood at the meatus        LABS  Pending    DIAGNOSTIC STUDIES  Xray chest, t,l spine, pelvis and left knee, tib fib and ankle,    CT Head, neck, ap and  chest pending      Assessment and Clinical Decision-making  49 yo m with fall and now multiple areas that are tender.  Will ct chest looking for aortic path given decel injury.  Cannot clear c spine given injuries and will ct head and neck given mechanism.  Ua and labs.  CT to assess retroperitoneum.  IV pain meds and zofran.    Patient discussed with attending, Jerlyn Ly, MD, before final recommendations are made.       Matt Holmes Shrita Thien  4:45 PM        Will Bonnet, MD  Resident  10/28/14 1701    Jerlyn Ly, MD  10/28/14 [phone]

## 2014-10-28 NOTE — ED Notes (Signed)
2136: dr Leda Gauzebenaron in to inform pt of multilple rib fx and that an abdominal  ultrasound is ordered

## 2014-10-28 NOTE — ED Notes (Signed)
PRN note.  -MD Benaron at bedside to remove back board. Pt was rolled with 3 RN, MD Benaron removed back board and palpated pt's back, pt report tenderness from mid back to lower back. PMS intact before and after removal of back board. C/collar remains.

## 2014-10-28 NOTE — ED Notes (Signed)
c-collar removed by dr Leda Gauzebenaron

## 2014-10-28 NOTE — ED Notes (Signed)
Bed: 07A  Expected date:   Expected time:   Means of arrival:   Comments:

## 2014-10-28 NOTE — ED Notes (Signed)
2225: report to IMU accepting RN, [name]  Pt to go to floor via gurney while on monitor accompanied by rn and er tech  Pt is a,x4 in nad    Trauma MD in to eval pt  thsi rm told trauma MD of low Na and hyperkalemia if she wants to place orders for this(she will orer EKG)  Pt unable to void at th is time

## 2014-10-28 NOTE — ED Notes (Signed)
1910: assumed care of pt who came in s/p 7 ft fall backwards onto concrete.  1920: currently; awake in bed in nad. Wearing c-collar, MAE=, decreased L knee mobility 2/2 pain/swelling  Pain 8/10  Pt informed of plan for awaiting CT/radiology results, NPO, keep collar on, pt understood and agreed  Bed low locked, siderails up x2, call light w/i reach  1935: Dr Leda Gauzebenaron aware of pt complaint of pain and asking for pain med

## 2014-10-28 NOTE — ED Notes (Signed)
PRN note.   -Pt medicated as ordered for pain tolerating well.
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
## 2014-10-28 NOTE — ED Notes (Signed)
Patient fell about feet at work.  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  Patient reports he fell through a door that was supposed to be locked as he was getting off a piece of machinery.  Patient reports 10/10 back and left leg pain.  C collar and back board placed in field.  Cleared by ED MD.

## 2014-10-28 NOTE — ED Notes (Signed)
PRN note.  -Pt reports his L leg pain is 10/10, comfort measures given and MD Benaron informed.

## 2014-10-28 NOTE — ED Notes (Signed)
2120: dr Leda Gauzebenaron aware pt asking to have his c-collar emoved and wanting to eat  Dr Leda Gauzebenaron informed this rn that he will review radiology studies to determine dispo  Pt aware  Pain 0/10 at this time

## 2014-10-29 DIAGNOSIS — T07 Unspecified multiple injuries: Secondary | ICD-10-CM

## 2014-10-29 LAB — CBC WITH DIFF, BLOOD
ANC-Automated: 3.6 10*3/uL (ref 1.6–7.0)
Abs Eosinophils: 0.3 10*3/uL (ref 0.1–0.7)
Abs Lymphs: 1 10*3/uL (ref 0.8–3.1)
Abs Monos: 0.4 10*3/uL (ref 0.2–0.8)
Basophils: 1 % (ref 0–2)
Eosinophils: 5 % — ABNORMAL HIGH (ref 1–4)
Hct: 34.4 % — ABNORMAL LOW (ref 40.0–50.0)
Hgb: 12.1 gm/dL — ABNORMAL LOW (ref 13.7–17.5)
Lymphocytes: 19 % (ref 19–53)
MCH: 32 pg (ref 26.0–32.0)
MCHC: 35.2 % (ref 32.0–36.0)
MCV: 91 um3 (ref 79.0–95.0)
MPV: 12.4 fL (ref 9.4–12.4)
Monocytes: 7 % (ref 5–12)
Plt Count: 188 10*3/uL (ref 140–370)
RBC: 3.78 10*6/uL — ABNORMAL LOW (ref 4.60–6.10)
RDW: 12.1 % (ref 12.0–14.0)
Segs: 68 % (ref 34–71)
WBC: 5.2 10*3/uL (ref 4.0–10.0)

## 2014-10-29 LAB — URINALYSIS
Bilirubin: NEGATIVE
Ketones: NEGATIVE
Leuk Esterase: NEGATIVE
Nitrite: NEGATIVE
Specific Gravity: 1.023 (ref 1.002–1.030)
Urobilinogen: NEGATIVE
pH: 5 (ref 5.0–8.0)

## 2014-10-29 LAB — BASIC METABOLIC PANEL, BLOOD
Anion Gap: 10 mmol/L (ref 7–15)
BUN: 23 mg/dL — ABNORMAL HIGH (ref 6–20)
Bicarbonate: 26 mmol/L (ref 22–29)
Calcium: 8.4 mg/dL — ABNORMAL LOW (ref 8.5–10.6)
Chloride: 99 mmol/L (ref 98–107)
Creatinine: 0.99 mg/dL (ref 0.67–1.17)
GFR: >60 mL/min
Glucose: 259 mg/dL — ABNORMAL HIGH (ref 70–115)
Potassium: 4.8 mmol/L (ref 3.5–5.1)
Sodium: 135 mmol/L — ABNORMAL LOW (ref 136–145)

## 2014-10-29 LAB — LIVER PANEL, BLOOD
ALT (SGPT): 73 U/L — ABNORMAL HIGH (ref 0–41)
AST (SGOT): 63 U/L — ABNORMAL HIGH (ref 0–40)
Albumin: 3.4 g/dL — ABNORMAL LOW (ref 3.5–5.2)
Alkaline Phos: 111 U/L (ref 40–129)
Bilirubin, Dir: <0.2 mg/dL (ref <0.2)
Bilirubin, Tot: 0.85 mg/dL (ref <1.20)
Total Protein: 6.3 g/dL (ref 6.0–8.0)

## 2014-10-29 LAB — GLUCOSE (POCT)
Glucose (POCT): 215 mg/dL — ABNORMAL HIGH (ref 70–115)
Glucose (POCT): 245 mg/dL — ABNORMAL HIGH (ref 70–115)
Glucose (POCT): 246 mg/dL — ABNORMAL HIGH (ref 70–115)
Glucose (POCT): 278 mg/dL — ABNORMAL HIGH (ref 70–115)

## 2014-10-29 LAB — GLYCOSYLATED HGB(A1C), BLOOD: Glyco Hgb (A1C): 9.9 % — ABNORMAL HIGH (ref 4.8–5.9)

## 2014-10-29 LAB — APTT, BLOOD: PTT: 30.7 s (ref 25.0–34.0)

## 2014-10-29 MED ORDER — MORPHINE SULFATE 2 MG/ML IJ SOLN
2.0000 mg | INTRAMUSCULAR | Status: DC | PRN
Start: 2014-10-29 — End: 2014-10-30
  Administered 2014-10-29 – 2014-10-30 (×2): 2 mg via INTRAVENOUS
  Filled 2014-10-29 (×3): qty 1

## 2014-10-29 MED ORDER — NALOXONE HCL 0.4 MG/ML IJ SOLN
0.1000 mg | INTRAMUSCULAR | Status: DC | PRN
Start: 2014-10-29 — End: 2014-10-30

## 2014-10-29 MED ORDER — OXYCODONE HCL 10 MG OR TABS
10.0000 mg | ORAL_TABLET | ORAL | Status: DC | PRN
Start: 2014-10-29 — End: 2014-10-30
  Administered 2014-10-29 – 2014-10-30 (×5): 10 mg via ORAL
  Filled 2014-10-29 (×5): qty 1

## 2014-10-29 MED ORDER — PATCH REMOVAL
Status: DC
Start: 2014-10-29 — End: 2014-10-30
  Administered 2014-10-29: 12:00:00 via TRANSDERMAL

## 2014-10-29 MED ORDER — INSULIN LISPRO (HUMAN) 100 UNIT/ML SC SOLN (CUSTOM)
1.0000 [IU] | Freq: Four times a day (QID) | INTRAMUSCULAR | Status: DC
Start: 2014-10-29 — End: 2014-10-30
  Administered 2014-10-29: 09:00:00 3 [IU] via SUBCUTANEOUS
  Administered 2014-10-29 (×3): 4 [IU] via SUBCUTANEOUS
  Administered 2014-10-30: 08:00:00 3 [IU] via SUBCUTANEOUS
  Filled 2014-10-29 (×2): qty 4
  Filled 2014-10-29: qty 6
  Filled 2014-10-29 (×2): qty 3

## 2014-10-29 MED ORDER — OXYCODONE HCL 5 MG OR TABS
5.0000 mg | ORAL_TABLET | ORAL | Status: DC | PRN
Start: 2014-10-29 — End: 2014-10-30

## 2014-10-29 MED ORDER — LIDOCAINE 5 % EX PTCH
1.0000 | MEDICATED_PATCH | CUTANEOUS | Status: DC
Start: 2014-10-29 — End: 2014-10-30
  Administered 2014-10-29: 1 via TRANSDERMAL
  Filled 2014-10-29 (×2): qty 1

## 2014-10-29 MED ORDER — PATCH REMOVAL
Status: DC
Start: 2014-10-29 — End: 2014-10-30
  Administered 2014-10-29: 16:00:00 via TRANSDERMAL

## 2014-10-29 MED ORDER — CYCLOBENZAPRINE HCL 10 MG OR TABS
5.0000 mg | ORAL_TABLET | Freq: Three times a day (TID) | ORAL | Status: DC | PRN
Start: 2014-10-29 — End: 2014-10-30
  Administered 2014-10-29: 5 mg via ORAL
  Filled 2014-10-29: qty 1

## 2014-10-29 MED ORDER — LIDOCAINE 5 % EX PTCH
1.0000 | MEDICATED_PATCH | CUTANEOUS | Status: DC
Start: 2014-10-29 — End: 2014-10-30
  Administered 2014-10-29 – 2014-10-30 (×2): 1 via TRANSDERMAL
  Filled 2014-10-29: qty 1

## 2014-10-29 NOTE — Plan of Care (Signed)
Problem: Falls, Risk of  Goal: Keep patient free from falls utilizing universal fall precautions  PT remains free of falls, steady on feet. A+Ox4 Uses call light appropriately. Verbalizes understanding of fall precautions.     Problem: Pain - Acute  Goal: Control of acute pain  PT has pain 9/10 from BL rib fractures. Well controlled with Oxycodone. One rescue dose morphine given for severe pain after using incentive spirometer.    Problem: Tissue Perfusion, Cardiopulmonary - Altered  Goal: Hemodynamic stability   Vitals have been stable, SR on tele. No complaints of chest pain at this time. SOB on exertion due to inability to take deep breath 2/2 to rib fx. Will continue to monitor.

## 2014-10-29 NOTE — Progress Notes (Signed)
Progress Note  White/Trauma    Patient Name: Larry Arnold  MRN: [id number]  Room#: 534/534A  Mechanism of Injury: [facility] Day:   1 day - Admitted on: 10/28/2014 Post-Injury Day: 1 Service: Trauma Surgery      #  Injury  Management  Date     B/l rib fractures  Pain managemnet  1/22     Renal insufficiency  concervative mgmt  1/22      8M, fall from height onto back, b/l rib fractures (fifth through tenth ribs on the right and ninth and eleventh ribs on the left)     SUBJECTIVE:     Events/Complaints:   -admitted to trauma service  -no acute events overnight    OBJECTIVE:    Pain score: 8    Vital Signs:     Latest Entry  Range (last 24 hours)    Temperature: 97.8 F (36.6 C)  Temp  Avg: 97.9 F (36.6 C)  Min: 97.8 F (36.6 C)  Max: 98 F (36.7 C)    Blood pressure (BP): 146/87 mmHg  BP  Min: 118/78  Max: 163/96    Heart Rate: 95  Pulse  Avg: 88.8  Min: 76  Max: 104    Respirations: 15  Resp  Avg: 33  Min: 14  Max: 100    SpO2: 99 %  SpO2  Avg: 86.9 %  Min: 18 %  Max: 100 %     Weight - scale: 92.987 kg (205 lb)  Percentage Weight Change (%): 0 %       Bowel movement: pending    Physical:  General Appearance: NAD  Neuro: GCS 15, motor grossly intact in all extremities.   Head: NCAT. No cephalohematoma or laceration.  Eyes: Conjunctivae and corneas clear; Pupils reactive (3 to 2 mm) EOM's intact.   Ears: Normal TMs and canal  Nose: Normal  Mouth: Normal; no malocclusion  Neck: No C-spine tenderness, no step offs or deformities   Back: T and L spine tenderness, no step offs or deformities  Heart: Normal rate and regular rhythm  Lungs: Clear to auscultation bilaterally, no chest wall tenderness to palpation  Abdomen: Soft, ND. TD.   Pelvis: Nontender to compression. Stable.  MSK: FROM of all extremities. Tender over left ankle and knee, no deformities, contusion on left leg  Vascular: Palpable radial, femoral, pedal pulses bilaterally  Rectal: normal tone, no blood, FOBT neg  Skin: No abrasions, no  lacerations       Labs:     CBC  Recent Labs      10/28/14   1728   WBC  13.2*   HGB  14.1   HCT  38.4*   PLT  218   SEG  81*   LYMPHS  10*   MONOS  6        Chemistry  Recent Labs      10/28/14   1728   NA  134*   K  5.2*   CL  99   BICARB  21*   BUN  29*   CREAT  1.71*   GLU  293*   [place]  8.6     No results for input(s): ALK, AST, ALT, TBILI, DBILI, ALB in the last 72 hours.       Coags  No results for input(s): PT, PTT, INR in the last 72 hours.    Toxicology:  No results found for: ETOH, AMPHETCL, BARBCLASS, BENZDIAZCL, BENZYLCGNN, METHADONE, OPIATESCL,  OXY, PHENCYCLDN, PROPOXYPHN, TETCANNABIN    Radiology:   X-ray Knee 1 Or 2 Views - Left    10/28/2014  IMPRESSION: Mild soft tissue swelling anterior to the tibial femoral joint, otherwise no  evidence of acute fracture or dislocation.  Mild tricompartmental osteoarthritis.     X-ray Tibia & Fibula 2 Views - Left  10/28/2014  IMPRESSION: Soft tissue swelling at the medial aspect of the proximal tibia and knee,  otherwise no evidence of acute fracture or dislocation.  Mild tibial femoral osteoarthritis.      X-ray Ankle Complete Minimum 3 Views - Left  10/28/2014   IMPRESSION: No evidence of acute fracture or dislocation of the left ankle.      Ct Head W/o Contrast  10/28/2014  IMPRESSION: 1. No intracranial hemorrhage, midline shift or mass effect.  CONCURRENT SUPERVISION: I have reviewed the images and agree with the Resident's interpretation.     X-ray Chest Single View Frontal  10/28/2014   IMPRESSION: Age-indeterminate mildly displaced fracture of the left posterior eleventh  rib. Correlate with point tenderness.  No acute cardiopulmonary abnormality.    Ct Chest W/o Contrast  10/28/2014   IMPRESSION: 1. Multiple bilateral acute nondisplaced rib fractures (fifth through tenth  ribs on the right and ninth and eleventh ribs on the left). Additional  multiple left anterior rib angulation deformities, age indeterminate.  Associated atelectasis without evidence of  pneumothorax.  2. Sub 0.4 cm right lower lobe pulmonary nodule. Please see the Fleischner  criteria guidelines below.  3. Gallbladder sludge/stones without evidence of acute cholecystitis.   Fleischner Society Guidelines:  A. LOW RISK PATIENTS: <= 4 mm - No follow-up required >4-6 mm - Follow-up CT at 12 months; if unchanged, no further follow-up  required. >6-8 mm - Follow-up CT at 6-12 months, then at 18-24 months; if unchanged, no  further follow-up required. >8 mm - Follow-up CT at around 3, 9 and 24 months.  B. HIGH RISK PATIENTS: <= 4 mm - Follow-up CT at 12 months; if unchanged, no further follow-up  required. >4-6 mm - Follow-up CT at 6-12 months, then at 18-24 months; if unchanged, no  further follow-up required. >6-8 mm - Follow-up CT at around 3, 9 and 24 months. >8 mm - Follow-up CT at around 3, 9 and 24 months.  * Recommendations are for incidentally detected nodules in persons 47 years of  age or older * Non-solid (ground-glass) or semi-solid nodules may require longer follow-up  to exclude adenocarcinoma in situ  Reference: Radiology 2005; [id number]:395-400   The study was coded 1001 in EPIC for the attention of the ordering provider.   CTRM:1001    X-ray Thoracic Spine 2 Views  10/28/2014   IMPRESSION: Age-indeterminate mildly displaced fracture of the left posterior eleventh  rib.    X-ray Lumbosacral Spine 2 Or 3 Views  10/28/2014     IMPRESSION: Age-indeterminate mildly displaced fracture of the left posterior eleventh  rib, correlate with point tenderness.    Ct C-spine W/o Contrast  10/28/2014   IMPRESSION: 1. No acute osseus abnormality of cervical spine.  2. Scattered carotid and vertebral artery calcification, early for age.     X-ray Pelvis 1 Or 2 Views  10/28/2014   IMPRESSION: No evidence of acute displaced pelvic fracture.      US Abdomen Limited  10/28/2014   MPRESSION: No sonographic evidence of gross abdominal injury.  Gallbladder sludge.      Medications:  Scheduled Meds   acetaminophen  650 mg  Q6H    famotidine  20 mg BID    insulin lispro  1-10 Units 4x Daily WC    lidocaine  1 patch Q24H NR    Patch Removal   Q24H NR    senna  2 tablet QAM     PRN Meds   dextrose  12.5 g PRN    glucagon  1 mg Once PRN    glucose  4 tablet PRN    glucose  1 Tube PRN    HYDROmorphone  1 mg Q2H PRN    ondansetron  4 mg Q6H PRN    oxyCODONE  10 mg Q4H PRN    oxyCODONE  15 mg Q4H PRN       Smoking History:    has no tobacco history on file.    ASSESSMENT / PLAN:  63M, fall from height onto back, b/l rib fractures (fifth through tenth ribs on the right and ninth and eleventh ribs on the left). Admitted for pain control.    #Bilateral rib fractures  - cxr read pending  - admitted for pain control  - lung expansion protocol  - Pain control transition to PO      # CKD with K 5.2 now 4.8, EKG negative for changes  - will continue to trend with daily bmp    #Dispo   - pending pain control  - SW

## 2014-10-29 NOTE — Plan of Care (Signed)
Problem: Falls, Risk of  Goal: Keep patient free from falls utilizing universal fall precautions  Pt is a high fall risk with current history of falls. Pt wearing yellow nonskid socks at all times and while OOB ambulating. Pt is resting in bed comfortably this shift. 2/4 bed rails up, bed alarm on, call light in Pt's possession and instructed to call nursing staff if needing to get OOB or use the commode. Pt reoriented frequently to surroundings. Will continue to monitor for fall risks. No falls or injury this shift.    Problem: Pain - Acute  Goal: Control of acute pain  Pt stated he has 9/10 bi lateral  Rib pain from rib Fx to side and sternum area. Pt Received oxyCODONE (ROXICODONE) tablet 15 mg and 650mg  Tylenol. See MAR Pt resting upon pain reassessments. Pt is appropriately communicating pain on the verbal self-report scale and a pain tolerance level of 5/10. Pt has been waking at 4-5 hr intervals requesting pain medication. Will continue to work with the patient on pain management and discomfort.     Problem: Tissue Perfusion, Cardiopulmonary - Altered  Goal: Hemodynamic stability  Pt's HR 80's and BP 140's/80's SP02 98 and stable this shift. . Pt denies chest pain not related to his rib pain, anxiety or restlessness. Good capillary refill in all extremities and skin tone warm, dry and normal for ethnicity. Please see epic flow sheet for VS data. Will continue to monitor for changes in cardiopulmonary perfusion.

## 2014-10-30 DIAGNOSIS — J9811 Atelectasis: Secondary | ICD-10-CM

## 2014-10-30 LAB — CBC WITH DIFF, BLOOD
ANC-Automated: 2.6 10*3/uL (ref 1.6–7.0)
Abs Basophils: 0.1 10*3/uL (ref <0.1)
Abs Eosinophils: 0.4 10*3/uL (ref 0.1–0.7)
Abs Lymphs: 1.3 10*3/uL (ref 0.8–3.1)
Abs Monos: 0.4 10*3/uL (ref 0.2–0.8)
Basophils: 1 % (ref 0–2)
Eosinophils: 8 % — ABNORMAL HIGH (ref 1–4)
Hct: 34.6 % — ABNORMAL LOW (ref 40.0–50.0)
Hgb: 12.2 gm/dL — ABNORMAL LOW (ref 13.7–17.5)
Lymphocytes: 26 % (ref 19–53)
MCH: 31.9 pg (ref 26.0–32.0)
MCHC: 35.3 % (ref 32.0–36.0)
MCV: 90.3 um3 (ref 79.0–95.0)
MPV: 12.8 fL — ABNORMAL HIGH (ref 9.4–12.4)
Monocytes: 9 % (ref 5–12)
Plt Count: 163 10*3/uL (ref 140–370)
RBC: 3.83 10*6/uL — ABNORMAL LOW (ref 4.60–6.10)
RDW: 12 % (ref 12.0–14.0)
Segs: 55 % (ref 34–71)
WBC: 4.8 10*3/uL (ref 4.0–10.0)

## 2014-10-30 LAB — BASIC METABOLIC PANEL, BLOOD
Anion Gap: 8 mmol/L (ref 7–15)
BUN: 14 mg/dL (ref 6–20)
Bicarbonate: 26 mmol/L (ref 22–29)
Calcium: 8.1 mg/dL — ABNORMAL LOW (ref 8.5–10.6)
Chloride: 98 mmol/L (ref 98–107)
Creatinine: 0.86 mg/dL (ref 0.67–1.17)
GFR: >60 mL/min
Glucose: 214 mg/dL — ABNORMAL HIGH (ref 70–115)
Potassium: 4.7 mmol/L (ref 3.5–5.1)
Sodium: 132 mmol/L — ABNORMAL LOW (ref 136–145)

## 2014-10-30 LAB — GLUCOSE (POCT): Glucose (POCT): 219 mg/dL — ABNORMAL HIGH (ref 70–115)

## 2014-10-30 MED ORDER — INFLUENZA VAC SPLIT QUAD 0.5 ML IM SUSY
0.5000 mL | PREFILLED_SYRINGE | INTRAMUSCULAR | Status: DC
Start: 2014-10-30 — End: 2014-10-30

## 2014-10-30 MED ORDER — OXYCODONE-ACETAMINOPHEN 5-325 MG OR TABS
1.00 | ORAL_TABLET | Freq: Four times a day (QID) | ORAL | Status: AC | PRN
Start: 2014-10-30 — End: ?

## 2014-10-30 NOTE — Interdisciplinary (Signed)
Discharge instructions given to patient, verbalizes understanding with teach back complete.  Pt states will call and pay for his own cab back to hotel and [place]  Patient provided with CD copy of all imaging. Pt ambulating steadily and independently, in no acute distress at time of dc.

## 2014-10-30 NOTE — Progress Notes (Signed)
[place] [facility] Tertiary Survey    Date of injury:     Mechanism of injury: fall    Injuries Identified on Primary and  Secondary Surveys:  1. Bilateral rib fractures      Past Medical History:  Past Medical History   Diagnosis Date    DM (diabetes mellitus)        Home Medications:  No prescriptions prior to admission       Objective Findings  Vital Signs:     Latest Entry  Range (last 24 hours)    Temperature: 97.5 F (36.4 C)  Temp  Avg: 97.8 F (36.6 C)  Min: 97 F (36.1 C)  Max: 98.3 F (36.8 C)    Blood pressure (BP): 138/84 mmHg  BP  Min: 113/69  Max: 174/82    Heart Rate: 77  Pulse  Avg: 82.4  Min: 73  Max: 91    Respirations: 14  Resp  Avg: 19.1  Min: 14  Max: 30    SpO2: 97 %  SpO2  Avg: 96.3 %  Min: 94 %  Max: 99 %       No Data Recorded       No Data Recorded       No Data Recorded    Pain Score: 7       Glasgow Coma Scale:  Eyes: open spontaneously - 4  Verbal: Oriented Conversation - 5  Motor: Obeys Command - 6  GCS = 15    Physical Exam  HEENT: Normal  Neck: normal  C-Spine: No pain on palpation, flexion, extension, or rotation., No stepoffs, No Neurologic Injury, No distracting injuries, No evidence of acute osseous abnormalities on imaging and Final interpretations of radiographs complete and reports reviewed  Chest Wall/Lungs: chest clear, no wheezing, rales, normal symmetric air entry TTP over rib fracture sites  Cardiac: regular rate and rhythm, S1, S2 normal, no murmur, click, rub or gallop  Abd: Abdomen soft, non-tender. BS normal. No masses, organomegaly  T & L Spine: No pain on palpation, No stepoffs, No neuro defecits and Final interpretations of radiographs complete and reports reviewed  Posterior Skin: Posterior skin intact with no signs of injury or pressure-related breakdown  Wounds Present: No wounds present  Wound Infection Suspected: no    Extremities  Left Upper Extremity: no swelling, deformities, pain, or other abnormalities  Right Upper Extremity:  no swelling, deformities, pain,  or other abnormalities  Left Lower Extremity:  swelling at the medial aspect of the proximal tibia and knee   Right Lower Extremity:  no swelling, deformities, pain, or other abnormalities    Neurovascular  Carotid: R 2 L 2  Radial: R 2  L2  Posterior Tibial R 2 L 2  Dorsalis Pedis R 2 L 2    Motor/Sensory Exam:  Motor: intact  Sensory: intact  Ambulation: Normal    Imaging Review  Available Imaging Reviewed: yes  Radiologist Final Results Posted and Reviewed: yes  Additional Imaging Indicated: no    Spine Clearance  C-Spine Cleared: yes  T-L Spine Cleared: yes    History Review  The following portions of the patient's history were reviewed and updated as necessary:   social history    Assessment/Plan  29M, fall from height onto back, b/l rib fractures (fifth through tenth ribs on the right and ninth and eleventh ribs on the left). Admitted for pain control. Patient feels ready for discharge.     #Bilateral rib fractures: no pneumothorax or atelectasis  noted in CXR. 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 Cr downtrending to 8.6 form 0.99  - will continue to trend with daily bmp    #Dispo   - plan for today  - SW

## 2014-10-30 NOTE — Plan of Care (Signed)
Problem: Falls, Risk of  Goal: Keep patient free from falls utilizing universal fall precautions  Outcome: Met  No Falls on Noc Shift, Goal Ongoing. Bed locked in low position, side rails up per policy  Bed alarm on     Problem: Pain - Acute  Goal: Control of acute pain  Outcome: Met  Patients pain is controlled with q4 hr oxycodone as well and intermittent rescue doses of morphine    Problem: Tissue Perfusion, Cardiopulmonary - Altered  Goal: Hemodynamic stability  Outcome: Met  Patient remains hemodynamically stable with following  Temperature:  [97.7 F (36.5 C)-98.3 F (36.8 C)] 98.3 F (36.8 C) (01/24 0000)  Blood pressure (BP): (113-174)/(69-94) 159/92 mmHg (01/24 0004)  Heart Rate:  [73-104] 79 (01/24 0130)  Respirations:  [14-30] 16 (01/24 0130)  Pain Score:  [-] Patient Sleeping, Respiratory Assessment Done (01/24 0130)  O2 Device:  [-] None (Room air) (01/24 0130)  SpO2:  [90 %-100 %] 96 % (01/24 0130)  Blood Pressure   10/30/14 159/92           Problem: Alteration in Blood Glucose  Goal: Glucose level within specified parameters  Outcome: Not Met  Patients blood glucose is not well controlled at this time, he is receiving subcutaneous insulin vs his oral anti diabetic medication regime.

## 2014-10-30 NOTE — Plan of Care (Signed)
Problem: Falls, Risk of  Goal: Keep patient free from falls utilizing universal fall precautions  Outcome: Met  Pt ambulating independently and with steady gait in hallway and room.  Nonskid socks when OOB, pt using call light appropriately for assistance. Pt safe from fall or injury at time of discharge.    Problem: Pain - Acute  Goal: Control of acute pain  Outcome: Met  Acute pain well controlled on current pain regimen per pt.    Problem: Tissue Perfusion, Cardiopulmonary - Altered  Goal: Hemodynamic stability  Outcome: Met  SR on tele, BP stable. 02 sat 95-100 on RA, using IS as instructed.  Respirations even and unlabored, no resp distress noted.  Pt in no cardiopulmonary distress, hemodynamically stable.    Problem: Alteration in Blood Glucose  Goal: Glucose level within specified parameters  Outcome: Not Met  Blood glucose elevated prior to breakfast, insulin administered per orders.

## 2014-10-30 NOTE — Progress Notes (Signed)
Progress Note  White/Trauma    Patient Name: Larry Arnold  MRN: [id number]  Room#: 534/534A  Mechanism of Injury: [facility] Day:   2 days - Admitted on: 10/28/2014 Post-Injury Day: 2 Service: Trauma Surgery      #  Injury  Management  Date     B/l rib fractures  Pain managemnet  1/22     Renal insufficiency  concervative mgmt  1/22      49M, fall from height onto back, b/l rib fractures (fifth through tenth ribs on the right and ninth and eleventh ribs on the left)     SUBJECTIVE:     Events/Complaints:   - No pneumothorax on CXR  - pain well controlled    OBJECTIVE:    Pain score: 4    Vital Signs:     Latest Entry  Range (last 24 hours)    Temperature: 97 F (36.1 C)  Temp  Avg: 97.9 F (36.6 C)  Min: 97 F (36.1 C)  Max: 98.3 F (36.8 C)    Blood pressure (BP): 162/83 mmHg  BP  Min: 113/69  Max: 174/82    Heart Rate: 87  Pulse  Avg: 82.6  Min: 73  Max: 91    Respirations: 22  Resp  Avg: 19.4  Min: 15  Max: 30    SpO2: 94 %  SpO2  Avg: 96.5 %  Min: 94 %  Max: 99 %     Weight - scale: 92.987 kg (205 lb)  Percentage Weight Change (%): 0 %    01/23 0600 - 01/24 0559  In: 560 [P.O.:560]  Out: 1050 [Urine:1050]  Bowel movement: pending    Physical:  General Appearance: NAD  Neuro: GCS 15, motor grossly intact in all extremities.   Head: NCAT. No cephalohematoma or laceration.  Eyes: Conjunctivae and corneas clear; Pupils reactive (3 to 2 mm) EOM's intact.   Ears: Normal TMs and canal  Nose: Normal  Mouth: Normal; no malocclusion  Neck: No C-spine tenderness, no step offs or deformities   Back: T and L spine tenderness, no step offs or deformities  Heart: Normal rate and regular rhythm  Lungs: Clear to auscultation bilaterally, no chest wall tenderness to palpation  Abdomen: Soft, ND. TD.   Pelvis: Nontender to compression. Stable.  MSK: FROM of all extremities. Tender over left ankle and knee, no deformities, contusion on left leg  Vascular: Palpable radial, femoral, pedal pulses bilaterally  Rectal: normal  tone, no blood, FOBT neg  Skin: No abrasions, no lacerations  Glasgow Coma Scale Score: 15    Labs:     CBC  Recent Labs      10/29/14   0904  10/30/14   0602   WBC  5.2  4.8   HGB  12.1*  12.2*   HCT  34.4*  34.6*   PLT  188  163   SEG  68  55   LYMPHS  19  26   MONOS  7  9        Chemistry  Recent Labs      10/29/14   0904  10/30/14   0602   NA  135*  132*   K  4.8  4.7   CL  99  98   BICARB  26  26   BUN  23*  14   CREAT  0.99  0.86   GLU  259*  214*   [place]  8.4*  8.1*  Recent Labs      10/29/14   0904   ALK  111   AST  63*   ALT  73*   TBILI  0.85   DBILI  <0.2   ALB  3.4*          Coags  Recent Labs      10/29/14   0904   PTT  30.7       Toxicology:  No results found for: ETOH, AMPHETCL, BARBCLASS, BENZDIAZCL, BENZYLCGNN, METHADONE, OPIATESCL, OXY, PHENCYCLDN, PROPOXYPHN, TETCANNABIN    Radiology:   X-ray Knee 1 Or 2 Views - Left    10/28/2014  IMPRESSION: Mild soft tissue swelling anterior to the tibial femoral joint, otherwise no  evidence of acute fracture or dislocation.  Mild tricompartmental osteoarthritis.     X-ray Tibia & Fibula 2 Views - Left  10/28/2014  IMPRESSION: Soft tissue swelling at the medial aspect of the proximal tibia and knee,  otherwise no evidence of acute fracture or dislocation.  Mild tibial femoral osteoarthritis.      X-ray Ankle Complete Minimum 3 Views - Left  10/28/2014   IMPRESSION: No evidence of acute fracture or dislocation of the left ankle.      Ct Head W/o Contrast  10/28/2014  IMPRESSION: 1. No intracranial hemorrhage, midline shift or mass effect.  CONCURRENT SUPERVISION: I have reviewed the images and agree with the Resident's interpretation.     X-ray Chest Single View Frontal  10/28/2014   IMPRESSION: Age-indeterminate mildly displaced fracture of the left posterior eleventh  rib. Correlate with point tenderness.  No acute cardiopulmonary abnormality.    Ct Chest W/o Contrast  10/28/2014   IMPRESSION: 1. Multiple bilateral acute nondisplaced rib fractures (fifth through  tenth  ribs on the right and ninth and eleventh ribs on the left). Additional  multiple left anterior rib angulation deformities, age indeterminate.  Associated atelectasis without evidence of pneumothorax.  2. Sub 0.4 cm right lower lobe pulmonary nodule. Please see the Fleischner  criteria guidelines below.  3. Gallbladder sludge/stones without evidence of acute cholecystitis.   Fleischner Society Guidelines:  A. LOW RISK PATIENTS: <= 4 mm - No follow-up required >4-6 mm - Follow-up CT at 12 months; if unchanged, no further follow-up  required. >6-8 mm - Follow-up CT at 6-12 months, then at 18-24 months; if unchanged, no  further follow-up required. >8 mm - Follow-up CT at around 3, 9 and 24 months.  B. HIGH RISK PATIENTS: <= 4 mm - Follow-up CT at 12 months; if unchanged, no further follow-up  required. >4-6 mm - Follow-up CT at 6-12 months, then at 18-24 months; if unchanged, no  further follow-up required. >6-8 mm - Follow-up CT at around 3, 9 and 24 months. >8 mm - Follow-up CT at around 3, 9 and 24 months.  * Recommendations are for incidentally detected nodules in persons 3 years of  age or older * Non-solid (ground-glass) or semi-solid nodules may require longer follow-up  to exclude adenocarcinoma in situ  Reference: Radiology 2005; 237:395-400   The study was coded 1001 in EPIC for the attention of the ordering provider.   CTRM:1001    X-ray Thoracic Spine 2 Views  10/28/2014   IMPRESSION: Age-indeterminate mildly displaced fracture of the left posterior eleventh  rib.    X-ray Lumbosacral Spine 2 Or 3 Views  10/28/2014     IMPRESSION: Age-indeterminate mildly displaced fracture of the left posterior eleventh  rib, correlate with point tenderness.    Ct  C-spine W/o Contrast  10/28/2014   IMPRESSION: 1. No acute osseus abnormality of cervical spine.  2. Scattered carotid and vertebral artery calcification, early for age.     X-ray Pelvis 1 Or 2 Views  10/28/2014   IMPRESSION: No evidence of acute displaced  pelvic fracture.      US Abdomen Limited  10/28/2014   MPRESSION: No sonographic evidence of gross abdominal injury.  Gallbladder sludge.      Medications:  Scheduled Meds   acetaminophen  650 mg Q6H    insulin lispro  1-10 Units 4x Daily WC    lidocaine  1 patch Q24H NR    lidocaine  1 patch Q24H NR    Patch Removal   Q24H NR    Patch Removal   Q24H NR    senna  2 tablet QAM     PRN Meds   cyclobenzaprine  5 mg TID PRN    dextrose  12.5 g PRN    glucagon  1 mg Once PRN    glucose  4 tablet PRN    glucose  1 Tube PRN    morphine  2 mg Q4H PRN    nalOXone  0.1 mg Q2 Min PRN    ondansetron  4 mg Q6H PRN    oxyCODONE  10 mg Q4H PRN    oxyCODONE  5 mg Q4H PRN    oxyCODONE  5 mg Q4H PRN       Smoking History:    has no tobacco history on file.    ASSESSMENT / PLAN:  57M, fall from height onto back, b/l rib fractures (fifth through tenth ribs on the right and ninth and eleventh ribs on the left). Admitted for pain control.    #Bilateral rib fractures: no pneumothorax or atelectasis noted in CXR. 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 Cr downtrending to 8.6 form 0.99  - will continue to trend with daily bmp    #Dispo   - plan for today  - SW

## 2014-10-30 NOTE — Discharge Summary (Addendum)
Patient Name:  Larry ShuttersRafael Arnold    Principal Diagnosis (required): [facility]     Hospital Problem List (required):  Active Hospital Problems    Diagnosis    Rib fracture [S22.39XA]      Resolved Hospital Problems    Diagnosis   No resolved problems to display.       Additional Hospital Diagnoses ("rule out" or "suspected" diagnoses, etc.):  Rule out intracranial, intrathoracic and intraabdominal injuries  Rule out cervical spine fractures  Rule out bone fractures  Rule out pneumothorax/hemothorax  Rule out atelectasis    Principal Procedure During This Hospitalization (required):    X-ray Knee 1 Or 2 Views - Left  Mild soft tissue swelling anterior to the tibial femoral joint, otherwise no  evidence of acute fracture or dislocation.  Mild tricompartmental osteoarthritis.     X-ray Tibia & Fibula 2 Views - Left  Soft tissue swelling at the medial aspect of the proximal tibia and knee,  otherwise no evidence of acute fracture or dislocation.  Mild tibial femoral osteoarthritis.      X-ray Ankle Complete Minimum 3 Views - Left  No evidence of acute fracture or dislocation of the left ankle.      Ct Head W/o Contrast  1. No intracranial hemorrhage, midline shift or mass effect.      X-ray Chest Single View Frontal  No acute cardiopulmonary disease.      X-ray Chest Single View Frontal  Age-indeterminate mildly displaced fracture of the left posterior eleventh  rib. Correlate with point tenderness.  No acute cardiopulmonary abnormality.    Ct Chest W/o Contrast  1. Multiple bilateral acute nondisplaced rib fractures (fifth through tenth  ribs on the right and ninth and eleventh ribs on the left). Additional  multiple left anterior rib angulation deformities, age indeterminate.  Associated atelectasis without evidence of pneumothorax.  2. Sub 0.4 cm right lower lobe pulmonary nodule. 3. Gallbladder sludge/stones without evidence of acute cholecystitis.   Fleischner Society Guidelines:  A. LOW RISK  PATIENTS: <= 4 mm - No follow-up required >4-6 mm - Follow-up CT at 12 months; if unchanged, no further follow-up  required. >6-8 mm - Follow-up CT at 6-12 months, then at 18-24 months; if unchanged, no  further follow-up required. >8 mm - Follow-up CT at around 3, 9 and 24 months.  B. HIGH RISK PATIENTS: <= 4 mm - Follow-up CT at 12 months; if unchanged, no further follow-up  required. >4-6 mm - Follow-up CT at 6-12 months, then at 18-24 months; if unchanged, no  further follow-up required. >6-8 mm - Follow-up CT at around 3, 9 and 24 months. >8 mm - Follow-up CT at around 3, 9 and 24 months.  * Recommendations are for incidentally detected nodules in persons 47 years of  age or older * Non-solid (ground-glass) or semi-solid nodules may require longer follow-up  to exclude adenocarcinoma in situ  Reference: Radiology 2005; [id number]:[id number]       X-ray Thoracic Spine 2 Views   Age-indeterminate mildly displaced fracture of the left posterior eleventh  rib.    X-ray Lumbosacral Spine 2 Or 3 Views  Age-indeterminate mildly displaced fracture of the left posterior eleventh  rib, correlate with point tenderness.    Ct C-spine W/o Contrast  1. No acute osseus abnormality of cervical spine.  2. Scattered carotid and vertebral artery calcification, early for age.      X-ray Pelvis 1 Or 2 Views  No evidence of acute displaced pelvic fracture.  US Abdomen Limited  No sonographic evidence of gross abdominal injury.  Gallbladder sludge.      Other Procedures Performed During This Hospitalization (required):  None    Inferior Vena Cava Filter Placement:  An IVC filter was not placed during this hospitalization.    Consultations Obtained During This Hospitalization:  Social Work    Reason for Admission to the Hospital / History of Present Illness:  Larry Arnold is a 49 year old male with past medical history no diabetes who presents today after falling several feet onto his back of a structure while working. The patient is  complaining of back pain as well as left leg pain and anterior chest pain. The patient denies any shortness of breath. The patient denies any abdominal pain. The patient says he is having no loss of bladder or bowel symptoms. The patient is unsure if he hit his head and does not think that he lost consciousness. The patient has no numbness or tingling in his feet.    Hospital Course (required):  Patient was admitted to the [facility]. The patient was evaluated by the ATLS team. Imaging studies revealed bilateral rib fractures but nono acute intracranial, intrathoracic or intraabdominal trauma .  Patient was admitted to trauma surgery for pain control, monitoring and follow-up studies to ensure adequate pulmonary function and no development of hemothorax or pneumothorax.     Patient was admitted on cervical spine precautions. Once the final radiology reports were available, the cervical spine was cleared and the cervical collar was removed.    During the course of his/her hospitalization, the patient has been afebrile and hemodynamically stable. Presently, pulseox sats 98-100% on room air without feelings of shortness of breath,  is tolerating regular diet, ambulating without difficulty and has adequate pain control on oral medication. Patient is discharged to home with his IS to encourage lung expansion. Patient was instructed on the continued use of the IS device upon discharge. The patient will be follow-up with his PCP in [place], [place] within one week. Patient informed and given copies of his imaging as well as written reports of his imaging showing a pulmonary nodule as well as gallstones.       Discharge Condition (required):  Good, Returned to Previous Level of Function.    Key Findings at Discharge:    Glasgow Coma Scale:    Eyes: open spontaneously - 4    Verbal: Oriented Conversation - 5    Motor: Obeys Command - 6    Discharge Medications:     What To Do With Your Medications      START taking  these medications       Add'l Info    oxyCODONE-acetaminophen 5-325 MG tablet   Commonly known as:  PERCOCET   Take 1 tablet by mouth every 6 hours as needed for Moderate Pain (Pain Score 4-6) or Severe Pain (Pain Score 7-10) for up to 30 doses.    Quantity:  30 tablet   Refills:  0         Where to Get Your Medications     These are the prescriptions that you need to pick up.         Please check with staff for printed prescription or if prescription was faxed to your pharmacy.   -  oxyCODONE-acetaminophen 5-325 MG tablet                      Allergies:  No Known Allergies  Discharge Disposition:  Home.    Follow Up Appointments:    Scheduled appointments:  Will schedule appointment with PCP within one week.     For appointments requested for after discharge that have not yet been scheduled, refer to the Post Discharge Referrals section of the After Visit Summary.    Discharging Physician's Contact Information:  [facility] operator at [phone].

## 2014-10-30 NOTE — Interdisciplinary (Signed)
10/30/14 0951   Referral Information   Referral Type Substance Abuse;Discharge Planning   Social Assessment   Primary Decision Maker Self   Advance Directive None   Interpreter Used? Not Needed        10/30/14 0951   Referral Information   Referral Type Substance Abuse;Discharge Planning   Social Assessment   Primary Decision Maker Self   Advance Directive None   Interpreter Used? Not Needed   Social Work Note: Larry Arnold is a 49 year old who fell at work and is admitted through trauma with rib fractures. Social Work consulted for drug and alcohol counseling and discharge planning. I met with the patient in his hospital room. He is dressed and ready for discharge. No tox screen was done but patient denies and drug or alcohol abuse. 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 Asking for a cab voucher but I suggested that he pay for cab himself and get receipt so that he can be reimbursed by Workers Comp.  Larry Arnold ,[place]  Pager [phone]

## 2014-10-30 NOTE — Progress Notes (Signed)
Dictating Practitioner:  Coby Shrewsberry Esmeralda LinksWILSON Karin Pinedo, M.D.        Date of Note:  10/29/2014    49 year old male who fell with multiple rib fractures. The patient's pain  control is adequate this morning. He is using his incentive spirometer with  over 2.5 L tidal volumes.    We will get a chest x-ray this morning to insure no development of  pneumothorax or hemothorax. We will advance him to a regular diet, heplock  his I.V. fluids and transition him to PO pain medications.        Electronically signed by:  Charna BusmanDD WILSON Polette Nofsinger, M.D. 10/30/2014 09:34 A          DD: 10/29/2014 TD: 12:00 P   DT:  10/30/2014 09:13 A   DocNo.:  [id number]  TWC/srf    cc:

## 2014-10-30 NOTE — Discharge Instructions (Signed)
Diagnosis and Reason for Admission    You were admitted to the hospital for the following reason(s):  Fall with rib fractures    Your full diagnosis list is located on this After Visit Summary in the Hospital Problems section.    What Happened During Your Hospital Stay    The main tests and treatments done for you during this hospitalization were:      X-ray Knee 1 Or 2 Views - Left  Mild soft tissue swelling anterior to the tibial femoral joint, otherwise no  evidence of acute fracture or dislocation.  Mild tricompartmental osteoarthritis.     X-ray Tibia & Fibula 2 Views - Left  Soft tissue swelling at the medial aspect of the proximal tibia and knee,  otherwise no evidence of acute fracture or dislocation.  Mild tibial femoral osteoarthritis.      X-ray Ankle Complete Minimum 3 Views - Left  No evidence of acute fracture or dislocation of the left ankle.      Ct Head W/o Contrast  1. No intracranial hemorrhage, midline shift or mass effect.      X-ray Chest Single View Frontal  No acute cardiopulmonary disease.      X-ray Chest Single View Frontal  Age-indeterminate mildly displaced fracture of the left posterior eleventh  rib. Correlate with point tenderness.  No acute cardiopulmonary abnormality.    Ct Chest W/o Contrast  1. Multiple bilateral acute nondisplaced rib fractures (fifth through tenth  ribs on the right and ninth and eleventh ribs on the left). Additional  multiple left anterior rib angulation deformities, age indeterminate.  Associated atelectasis without evidence of pneumothorax.  2. Sub 0.4 cm right lower lobe pulmonary nodule. 3. Gallbladder sludge/stones without evidence of acute cholecystitis.   Fleischner Society Guidelines:  A. LOW RISK PATIENTS: <= 4 mm - No follow-up required >4-6 mm - Follow-up CT at 12 months; if unchanged, no further follow-up  required. >6-8 mm - Follow-up CT at 6-12 months, then at 18-24 months; if unchanged, no  further follow-up required. >8 mm - Follow-up CT at  around 3, 9 and 24 months.  B. HIGH RISK PATIENTS: <= 4 mm - Follow-up CT at 12 months; if unchanged, no further follow-up  required. >4-6 mm - Follow-up CT at 6-12 months, then at 18-24 months; if unchanged, no  further follow-up required. >6-8 mm - Follow-up CT at around 3, 9 and 24 months. >8 mm - Follow-up CT at around 3, 9 and 24 months.  * Recommendations are for incidentally detected nodules in persons 47 years of  age or older * Non-solid (ground-glass) or semi-solid nodules may require longer follow-up  to exclude adenocarcinoma in situ  Reference: Radiology 2005; [id number]:[id number]       X-ray Thoracic Spine 2 Views   Age-indeterminate mildly displaced fracture of the left posterior eleventh  rib.    X-ray Lumbosacral Spine 2 Or 3 Views  Age-indeterminate mildly displaced fracture of the left posterior eleventh  rib, correlate with point tenderness.    Ct C-spine W/o Contrast  1. No acute osseus abnormality of cervical spine.  2. Scattered carotid and vertebral artery calcification, early for age.      X-ray Pelvis 1 Or 2 Views  No evidence of acute displaced pelvic fracture.      [place] Abdomen Limited  No sonographic evidence of gross abdominal injury.  Gallbladder sludge.      Instructions for After Discharge    Your diet at home should be a regular  diet.    Your activity level at home should be:  regular activity.    Specific activity restrictions:    Do not drive while taking narcotic pain medications.     Take pain medication as needed     Use your incentive spirometer regularly to keep your lungs expanded. You can decrease use as the pain decreases    Restart your home medications as prescribed by your primary care physician  Wound or tube care instructions:  None    Your medication list is located on this After Visit Summary in the Current Discharge Medication List section.  Your nurse will review this information with you before you leave the hospital.    It is very important for you to keep a current  medication list with you in order to assist your doctors with your medical care.  Bring this After Visit Summary with you to your follow up appointments.    Reasons to Contact a Doctor Urgently    Contact your primary care physician or clinic, or return to the nearest Emergency Department if:  Increased or uncontrolled pain.  Severe abdominal pain.  Nausea and vomiting.  Fevers or chills.  Shortness of breath or difficulty breathing.  Chest pains or palpitations.    If you have any questions about your hospital care, your medications, or if you have new or concerning symptoms soon after going home from the hospital, and you need to contact your hospital physician, contact the [facility] operator at [phone].    Once you are able to see your primary care physician (PCP) or primary clinic, they will then be responsible for further medication refills, or appointment referrals.    What Needs to Happen Next After Discharge -- Appointments and Follow Up    Any appointments already scheduled at [facility] clinics will be listed in the Future Appointments section at the top of this After Visit Summary.  Any appointments that have been requested, but have not yet been scheduled, will be listed below that under Post Discharge Referrals.    Brief follow up appointment for staple or suture removal:  Does not apply; you do not have any sutures or staples that need to be removed.    Follow-up with your primary care physician in your home state to discuss your pulmonary nodule findings as well as your gallstones.    Medical Home Information    Your primary care provider or clinic currently on file at [facility] is: PCP in [place], [place]    Handouts Given to You (if applicable)

## 2014-10-31 NOTE — Progress Notes (Signed)
Dictating Practitioner:  Paralee Pendergrass Esmeralda LinksWILSON Navya Timmons, M.D.        Date of Note:  10/30/2014    A 49 y/o male admitted after a fall.  He has multiple rib fractures.  He is  doing very well today.  Ambulating without difficulty.  Using his incentive  spirometry well.  He is tolerating a regular diet.  We will plan to  discharge him with PO pain medications today.        Electronically signed by:  Charna BusmanDD WILSON Jawana Reagor, M.D. 10/31/2014 10:59 A          DD: 10/30/2014 TD: 12:00 P   DT:  10/31/2014 10:02 A   DocNo.:  [id number]  TWC/lds    cc:

## 2014-11-02 NOTE — Interdisciplinary (Signed)
Social Work follow-up:    Pt called requesting a note for work, states he has a f/u appointment with the workers comp physician this Friday 1/29. Trauma MD provided note, which was faxed to pt at fax [phone] per his request.    Pt can be reached at [phone] or via his wife Brenda's cell phone [phone].

## 2014-11-03 DIAGNOSIS — Z136 Encounter for screening for cardiovascular disorders: Secondary | ICD-10-CM

## 2014-11-03 DIAGNOSIS — R079 Chest pain, unspecified: Secondary | ICD-10-CM

## 2014-11-03 LAB — ECG 12-LEAD
ATRIAL RATE: 81 {beats}/min
ECG INTERPRETATION: NORMAL
P AXIS: 37 degrees
PR INTERVAL: 156 ms
QRS INTERVAL/DURATION: 92 ms
QT: 366 ms
QTC INTERVAL: 425 ms
R AXIS: -1 degrees
T AXIS: 21 degrees
VENTRICULAR RATE: 81 {beats}/min

## 2014-11-04 ENCOUNTER — Ambulatory Visit
Admission: RE | Admit: 2014-11-04 | Discharge: 2014-11-04 | Disposition: A | Discharge status: Home / Self Care | Payer: Self-pay | Source: Ambulatory Visit | Attending: Family Medicine | Admitting: Family Medicine

## 2014-11-04 ENCOUNTER — Ambulatory Visit (INDEPENDENT_AMBULATORY_CARE_PROVIDER_SITE_OTHER): Payer: Worker's Compensation | Admitting: Family Medicine

## 2014-11-04 ENCOUNTER — Encounter: Payer: Self-pay | Admitting: Family Medicine

## 2014-11-04 VITALS — BP 139/83 | HR 99 | Temp 97.9°F | Ht 71.0 in | Wt 203.3 lb

## 2014-11-04 DIAGNOSIS — K5903 Drug induced constipation: Secondary | ICD-10-CM

## 2014-11-04 DIAGNOSIS — S9002XD Contusion of left ankle, subsequent encounter: Secondary | ICD-10-CM

## 2014-11-04 DIAGNOSIS — S300XXD Contusion of lower back and pelvis, subsequent encounter: Secondary | ICD-10-CM | POA: Diagnosis not present

## 2014-11-04 DIAGNOSIS — K5909 Other constipation: Secondary | ICD-10-CM

## 2014-11-04 DIAGNOSIS — S300XXA Contusion of lower back and pelvis, initial encounter: Secondary | ICD-10-CM | POA: Insufficient documentation

## 2014-11-04 DIAGNOSIS — S2239XD Fracture of one rib, unspecified side, subsequent encounter for fracture with routine healing: Secondary | ICD-10-CM | POA: Diagnosis not present

## 2014-11-04 DIAGNOSIS — S9002XA Contusion of left ankle, initial encounter: Secondary | ICD-10-CM | POA: Insufficient documentation

## 2014-11-04 DIAGNOSIS — T402X5A Adverse effect of other opioids, initial encounter: Secondary | ICD-10-CM

## 2014-11-04 MED ORDER — CYCLOBENZAPRINE HCL 10 MG PO TABS: 10.0000 mg | ORAL_TABLET | Freq: Three times a day (TID) | ORAL | Status: DC | PRN

## 2014-11-04 MED ORDER — POLYETHYLENE GLYCOL 3350 17 GM/SCOOP PO POWD: 17.0000 g | Freq: Two times a day (BID) | ORAL | Status: DC | PRN

## 2014-11-04 MED ORDER — OXYCODONE-ACETAMINOPHEN 10-325 MG PO TABS: 1.0000 | ORAL_TABLET | Freq: Three times a day (TID) | ORAL | Status: DC | PRN

## 2014-11-04 NOTE — Patient Instructions (Signed)
You need to get xrays performed of your left foot and ankle.  Make sure you take the percocet as directed. You will likely be in significant pain for a while. I've also sent a muscle relaxer, flexeril, to your pharmacy to take as directed. This will help with those spasms.  I'll give you a call when I see the xray results and we can develop a plan for follow up at that time.   I don't expect you to be performing many active duties for at [facility] 6 weeks.   Call the clinic at [phone] if your elevated bruise on the left knee gets bigger or begins draining. Also if you have problems breathing or develop a fever.   Take care of yourself,  - Dr. Bonner Puna

## 2014-11-04 NOTE — Progress Notes (Signed)
  [name] - 49 y.o. male MRN [id number]  Date of birth: 1966-06-05  SUBJECTIVE:     Date of injury: 10/28/2014 while at work.  He has hired a Chief Executive Officer in the interest of worker's cpomensation.   [name] fell on 10/28/2014 at work from 8 feet to paved ground landing on his back. He was immediately transported to the nearest hospital where they found rib fractures and additional imaging did not show other fractures. He was given analgesic medication and discharged. His pain has been severe and constant.  ROS:     Per HPI  PERTINENT  PMH / PSH FH / / SH:  Past Medical, Surgical, Social, and Family History Reviewed & Updated in the EMR.   OBJECTIVE: BP 139/83 mmHg  Pulse 99  Temp(Src) 97.9 F (36.6 C) (Oral)  Ht 5\' 11"  (1.803 m)  Wt 203 lb 4.8 oz (92.216 kg)  BMI 28.37 kg/m2  Physical Exam:  Vital signs are reviewed. Gen: 49 yo man in obvious pain Pulm: Limited, symmetrical excursions. No respiratory distress MSK: Exquisite tenderness to palpation of right and left rib cage. Back muscles spasm with no midline tenderness. Neck with full ROM.  Left ankle: Diffusely swollen with ecchymosis, lateral malleolar tenderness. No tenderness along the tibia or fibula, medial malleolus. No pain specifically of navicular or cuboid bones nor any metatarsals. Skin: ~6cm x 6cm edematous abrasion with granulation tissue on the medial aspect below the left knee. No fluctuance, drainage, or surrounding erythema.  Abd: Soft, benign  ASSESSMENT & PLAN:  See problem based charting & AVS for pt instructions.

## 2014-11-07 ENCOUNTER — Telehealth: Payer: Self-pay | Admitting: Family Medicine

## 2014-11-07 NOTE — Telephone Encounter (Signed)
Negative XR of ankle. Suspect extensive soft tissue injury.

## 2014-11-08 ENCOUNTER — Telehealth: Payer: Self-pay | Admitting: Family Medicine

## 2014-11-08 NOTE — Telephone Encounter (Signed)
Pt is requesting results from his xray test to ankle.

## 2014-11-08 NOTE — Telephone Encounter (Signed)
I think we sent a letter that hasn't made it to him yet. His xrays were negative. He has no fracture in his ankle. Could you please relay that to him?

## 2014-11-08 NOTE — Telephone Encounter (Signed)
FWD to DrMarland Kitchen Bonner Puna who ordered this x-ray

## 2014-11-08 NOTE — Telephone Encounter (Signed)
Spoke with patient and gave him xray results

## 2014-11-14 ENCOUNTER — Encounter (HOSPITAL_COMMUNITY): Payer: Self-pay | Admitting: Emergency Medicine

## 2014-11-14 ENCOUNTER — Encounter: Payer: Self-pay | Admitting: Family Medicine

## 2014-11-14 ENCOUNTER — Ambulatory Visit (INDEPENDENT_AMBULATORY_CARE_PROVIDER_SITE_OTHER): Payer: Worker's Compensation | Admitting: Family Medicine

## 2014-11-14 ENCOUNTER — Emergency Department (HOSPITAL_COMMUNITY): Payer: Worker's Compensation

## 2014-11-14 ENCOUNTER — Emergency Department (HOSPITAL_COMMUNITY): Payer: Self-pay

## 2014-11-14 ENCOUNTER — Emergency Department (HOSPITAL_COMMUNITY)
Admission: EM | Admit: 2014-11-14 | Discharge: 2014-11-14 | Disposition: A | Discharge status: Home / Self Care | Payer: Worker's Compensation | Attending: Emergency Medicine | Admitting: Emergency Medicine

## 2014-11-14 VITALS — BP 166/99 | HR 101 | Temp 97.8°F | Ht 71.0 in | Wt 212.0 lb

## 2014-11-14 DIAGNOSIS — Y30XXXA Falling, jumping or pushed from a high place, undetermined intent, initial encounter: Secondary | ICD-10-CM | POA: Insufficient documentation

## 2014-11-14 DIAGNOSIS — Y9389 Activity, other specified: Secondary | ICD-10-CM | POA: Diagnosis not present

## 2014-11-14 DIAGNOSIS — E119 Type 2 diabetes mellitus without complications: Secondary | ICD-10-CM | POA: Insufficient documentation

## 2014-11-14 DIAGNOSIS — I10 Essential (primary) hypertension: Secondary | ICD-10-CM | POA: Insufficient documentation

## 2014-11-14 DIAGNOSIS — Z794 Long term (current) use of insulin: Secondary | ICD-10-CM | POA: Insufficient documentation

## 2014-11-14 DIAGNOSIS — Z79899 Other long term (current) drug therapy: Secondary | ICD-10-CM | POA: Diagnosis not present

## 2014-11-14 DIAGNOSIS — Z91040 Latex allergy status: Secondary | ICD-10-CM | POA: Insufficient documentation

## 2014-11-14 DIAGNOSIS — R000 Tachycardia, unspecified: Secondary | ICD-10-CM | POA: Insufficient documentation

## 2014-11-14 DIAGNOSIS — Z87891 Personal history of nicotine dependence: Secondary | ICD-10-CM | POA: Insufficient documentation

## 2014-11-14 DIAGNOSIS — L989 Disorder of the skin and subcutaneous tissue, unspecified: Secondary | ICD-10-CM

## 2014-11-14 DIAGNOSIS — Y9289 Other specified places as the place of occurrence of the external cause: Secondary | ICD-10-CM | POA: Insufficient documentation

## 2014-11-14 DIAGNOSIS — M79662 Pain in left lower leg: Secondary | ICD-10-CM

## 2014-11-14 DIAGNOSIS — M79605 Pain in left leg: Secondary | ICD-10-CM

## 2014-11-14 DIAGNOSIS — Y990 Civilian activity done for income or pay: Secondary | ICD-10-CM | POA: Diagnosis not present

## 2014-11-14 DIAGNOSIS — M25472 Effusion, left ankle: Secondary | ICD-10-CM

## 2014-11-14 DIAGNOSIS — S2249XD Multiple fractures of ribs, unspecified side, subsequent encounter for fracture with routine healing: Secondary | ICD-10-CM | POA: Insufficient documentation

## 2014-11-14 DIAGNOSIS — M7989 Other specified soft tissue disorders: Secondary | ICD-10-CM | POA: Diagnosis not present

## 2014-11-14 DIAGNOSIS — M25572 Pain in left ankle and joints of left foot: Secondary | ICD-10-CM

## 2014-11-14 DIAGNOSIS — S8012XA Contusion of left lower leg, initial encounter: Secondary | ICD-10-CM | POA: Diagnosis not present

## 2014-11-14 DIAGNOSIS — S8992XA Unspecified injury of left lower leg, initial encounter: Secondary | ICD-10-CM | POA: Diagnosis present

## 2014-11-14 DIAGNOSIS — M25562 Pain in left knee: Secondary | ICD-10-CM

## 2014-11-14 MED ORDER — CEPHALEXIN 500 MG PO CAPS: 500.0000 mg | ORAL_CAPSULE | Freq: Four times a day (QID) | ORAL | Status: DC

## 2014-11-14 MED ORDER — CYCLOBENZAPRINE HCL 10 MG PO TABS
10.0000 mg | ORAL_TABLET | Freq: Once | ORAL | Status: AC
  Administered 2014-11-14: 10 mg via ORAL
  Filled 2014-11-14: qty 1

## 2014-11-14 MED ORDER — LISINOPRIL 20 MG PO TABS: 20.0000 mg | ORAL_TABLET | Freq: Every day | ORAL | Status: DC

## 2014-11-14 MED ORDER — OXYCODONE-ACETAMINOPHEN 5-325 MG PO TABS
2.0000 | ORAL_TABLET | Freq: Once | ORAL | Status: AC
  Administered 2014-11-14: 2 via ORAL
  Filled 2014-11-14: qty 2

## 2014-11-14 MED ORDER — LIDOCAINE-EPINEPHRINE (PF) 2 %-1:200000 IJ SOLN
10.0000 mL | Freq: Once | INTRAMUSCULAR | Status: AC
  Administered 2014-11-14: 10 mL via INTRADERMAL
  Filled 2014-11-14: qty 20

## 2014-11-14 NOTE — Discharge Instructions (Signed)
FOLLOW UP WITH YOUR DOCTOR FOR RECHECK IN 2 DAYS, SOONER WITH ANY FEVER, SEVERE PAIN OR HIGH FEVER. CONTINUE CURRENT MEDICATIONS AND TAKE KEFLEX AS PRESCRIBED. RETURN HERE AS NEEDED.

## 2014-11-14 NOTE — ED Notes (Signed)
Patient transported to X-ray 

## 2014-11-14 NOTE — ED Notes (Signed)
Pt c/o left leg swelling after having injury from fall with broken ribs; area is red and swollen; pt sent here to r/o DVT

## 2014-11-14 NOTE — Progress Notes (Signed)
Bilateral lower extremity venous duplex completed:  No evidence of DVT, superficial thrombosis, or Baker's cyst.   

## 2014-11-14 NOTE — ED Provider Notes (Signed)
CSN: [id number]     Arrival date & time 11/14/14  1647 History   First MD Initiated Contact with Patient 11/14/14 2049     Chief Complaint  Patient presents with  . Leg Swelling     (Consider location/radiation/quality/duration/timing/severity/associated sxs/prior Treatment) Patient is a 49 y.o. male presenting with leg pain. The history is provided by the patient. No language interpreter was used.  Leg Pain Location:  Leg Injury: yes   Leg location:  L lower leg Associated symptoms: no fever   Associated symptoms comment:  The patient fell approximately 8 feet while at work [place] an [place]-of-town location, landing on concrete. Injury occurred 1-2 weeks prior. He sustained multiple rib fractures and a large contusion to left leg resulting in a hematoma to anteromedial proximal LE. He reports x-rays of his leg injury were negative. On returning home he had further x-rays to left ankle that were also negative. He presents tonight after seeing his doctor again today for evaluation of increased pain of hematoma and marked swelling of the distal portion of the leg. He was sent to the ED to evaluate for possible DVT vs missed non-displaced fracture at the site. He denies SOB, worse chest pain, N, V, cough or fever. He reports the hematoma has reduced in size considerably since the injury.   Past Medical History  Diagnosis Date  . Hypertension   . Diabetes mellitus     Type 2 DM  x 24 years  . Foreign body in left foot     with  infection   Past Surgical History  Procedure Laterality Date  . No past surgeries    . I&d extremity  04/23/2012    Procedure: IRRIGATION AND DEBRIDEMENT EXTREMITY;  Surgeon: Newt Minion, MD;  Location: [facility];  Service: Orthopedics;  Laterality: Left;  Irrigation and Debridement Left Foot  . I&d extremity  09/18/2012    Procedure: IRRIGATION AND DEBRIDEMENT EXTREMITY;  Surgeon: Newt Minion, MD;  Location: [facility];  Service: Orthopedics;  Laterality: Left;   Family  History  Problem Relation Age of Onset  . Diabetes type II Mother   . Diabetes type II Sister    History  Substance Use Topics  . Smoking status: Former Smoker    Types: Cigarettes    Quit date: 09/03/2012  . Smokeless tobacco: Former Systems developer    Quit date: 09/03/2012  . Alcohol Use: 4.8 oz/week    6 Cans of beer, 2 Shots of liquor per week     Comment: weekends.    Review of Systems  Constitutional: Negative for fever and chills.  HENT: Negative.   Respiratory: Negative.  Negative for shortness of breath.   Gastrointestinal: Negative.  Negative for abdominal pain.  Musculoskeletal:       See HPI.  Skin: Positive for color change and wound.  Neurological: Negative.  Negative for weakness and numbness.      Allergies  Latex  Home Medications   Prior to Admission medications   Medication Sig Start Date End Date Taking? Authorizing Provider  cyclobenzaprine (FLEXERIL) 10 MG tablet Take 1 tablet (10 mg total) by mouth 3 (three) times daily as needed for muscle spasms. 11/04/14   Patrecia Pour, MD  Ibuprofen-Diphenhydramine Cit (IBUPROFEN PM) 200-38 MG TABS Take 2 tablets by mouth at bedtime as needed (for sleep and pain).    Historical Provider, MD  insulin aspart (NOVOLOG) 100 UNIT/ML injection Inject 3-6 Units into the skin 2 (two) times daily.  Historical Provider, MD  insulin aspart (NOVOLOG) 100 UNIT/ML injection Use sliding scale twice a day with meals 03/05/14   Tatyana A Kirichenko, PA-C  insulin glargine (LANTUS) 100 UNIT/ML injection Inject 0.26 mLs (26 Units total) into the skin daily. 03/29/13   Vivi Ferns, MD  insulin glargine (LANTUS) 100 UNIT/ML injection Inject 0.26 mLs (26 Units total) into the skin at bedtime. 03/05/14   Tatyana A Kirichenko, PA-C  lisinopril (PRINIVIL,ZESTRIL) 20 MG tablet Take 1 tablet (20 mg total) by mouth daily. 11/14/14   [place], MD  metFORMIN (GLUCOPHAGE) 500 MG tablet Take 500 mg by mouth 2 (two) times daily with a meal.     Historical Provider, MD  metFORMIN (GLUCOPHAGE) 500 MG tablet Take 1 tablet (500 mg total) by mouth 2 (two) times daily with a meal. 03/05/14   Tatyana A Kirichenko, PA-C  oxyCODONE-acetaminophen (PERCOCET) 10-325 MG per tablet Take 1 tablet by mouth every 8 (eight) hours as needed for pain. 11/04/14   Patrecia Pour, MD  polyethylene glycol powder (GLYCOLAX/MIRALAX) powder Take 17 g by mouth 2 (two) times daily as needed for moderate constipation. Take as needed to produce 1 normal bowel movement per day. 11/04/14   Patrecia Pour, MD   BP 163/101 mmHg  Pulse 102  Temp(Src) 97.7 F (36.5 C) (Oral)  Resp 22  Ht 5\' 11"  (1.803 m)  Wt 212 lb (96.163 kg)  BMI 29.58 kg/m2  SpO2 98% Physical Exam  Constitutional: He is oriented to person, place, and time. He appears well-developed and well-nourished.  HENT:  Head: Normocephalic.  Neck: Normal range of motion. Neck supple.  Cardiovascular: Tachycardia present.   Pulmonary/Chest: Effort normal.  Abdominal: Soft. Bowel sounds are normal. There is no tenderness. There is no rebound and no guarding.  Musculoskeletal: Normal range of motion. He exhibits edema.  Left lower extremity swelling. There is a large, swollen area to proximal anteromedial lower leg with fluctuance along upper border. Lower area is firm. The area is erythematous, mildly warm to touch. No calf tenderness. Foot/ankle swollen without focal tenderness.   Neurological: He is alert and oriented to person, place, and time.  Skin: Skin is warm and dry. No rash noted.  Psychiatric: He has a normal mood and affect.    ED Course  Procedures (including critical care time) Labs Review Labs Reviewed - No data to display  Imaging Review No results found.   EKG Interpretation None     INCISION AND DRAINAGE Performed by: Charlann Lange A Consent: Verbal consent obtained. Risks and benefits: risks, benefits and alternatives were discussed Type: abscess  Body area: left  LE  Anesthesia: local infiltration  Incision was made with a scalpel.  Local anesthetic: lidocaine 2% w/epinephrine  Anesthetic total: 2 ml  Complexity: complex Blunt dissection to break up loculations  Drainage: purulent  Drainage amount: clots only  Packing material: none  Patient tolerance: Patient tolerated the procedure well with no immediate complications.    MDM   Final diagnoses:  None    1. Hematoma left LE  No DVT per doppler study. No fracture on repeat x-ray of leg. I&D of hematoma produces only clots - no pus ruling out abscess. His pain is controlled. He is appropriate for discharge.    Dewaine Oats, PA-C 11/15/14 [id number]  Debby Freiberg, MD 11/17/14 [phone]

## 2014-11-14 NOTE — Progress Notes (Signed)
[name] - 49 y.o. male MRN [id number]  Date of birth: 1966-03-12  SUBJECTIVE:     Date of injury: 10/28/2014 while at work. Pt has an attorney representing him as this is a worker's compensation case.   [name] fell on 10/28/2014 at work from 8 feet to paved ground landing on his back. He was seen immediately in the ED, there, and was diagnosed with rib fractures on both sides but no LE fractures. He was given an Rx for pain medication and discharged. He states he flew an airplane back to [place]. He was seen here in our office by Dr. Bonner Puna on 1/29. He had swelling of his left ankle and calf, as well as a large "knot" on his anterior medial left leg just inferior to his knee that was swollen very large and painful. Xrays were obtained of his left lower leg and ankle, showing soft tissue swelling but no fractures. Since then, the swollen area just inferior to his knee has gotten smaller but is still swollen, and is now more tender and "soft" to the touch, as well as slightly more warm and red. His whole lower leg has continued to be swollen, including his ankle, and his pain is worse with movement / weight bearing. He has multiple abrasions on his lower leg, but has had no open sores, bleeding, or drainage from any areas. He has no systemic signs / symptoms of infection, but is concerned the area near his knee could be infected and wonders if it is causing his leg and ankle to remain swollen.  ROS:     Per HPI. Specifically denies fever / chills, N/V, change in bowel or bladder habits, frank SOB, or sensation of rapid heartbeat. He does have chest wall pain from rib fractures as above.  PERTINENT  PMH / PSH FH / / SH:  Past Medical, Surgical, Social, and Family History Reviewed & Updated in the EMR.   OBJECTIVE: BP 166/99 mmHg  Pulse 101  Temp(Src) 97.8 F (36.6 C) (Oral)  Ht 5\' 11"  (1.803 m)  Wt 212 lb (96.163 kg)  BMI 29.58 kg/m2  Physical Exam:  Vital signs are reviewed. Manual  recheck pulse ~96 Gen: adult male in NAD but uncomfortable especially with change in positions Cardio: RRR, no murmur appreciated Pulm: CTAB, some shallow breathing due to rib pain but no increased work of breathing, speaking in full sentences MSK: marked tenderness to palpation of rib cage, bilaterally Lower extremities:  Right extremity normal other than scattered, well-healing abrasions, with normal ROM, strength, and sensation grossly to touch  Left extremity swollen from knee to ankle, moderately tender in calf without frank redness and no palpable cords  Left ankle swollen and very tender to touch, with marked reduced ROM secondary to pain  Skin: ~ 4 cm x 5 cm lesion just inferior to left medial knee joint line, warm to the touch, very tender, with areas of fluctuance  Skin overlying this lesion with healing abrasions but no open ulceration, bleeding, or drainage  Nondiagnostic ultrasound of left lower leg lesion inferior to knee shows a few distinct areas suggestive of fluid or blood collection. NO observable vascular flow within areas concerning for fluid collection.  ASSESSMENT & PLAN: 49yo male with left lower extremity skin lesion / abrasion concerning for abscess formation or hematoma, also with diffuse lower extremity swelling and pain on the left - DDx for leg swelling / pain in general includes DVT, reactive inflammation from trauma / possible  infection, occult fracture not previously visible on xray - doubt PE given otherwise general good appearance, though pt was mildly tachycardic (recorded 101 immediately after ambulating into exam room, and with pain) and does have chest wall pain (though he has known rib fractures)  Plan: - Originally planned repeat xrays of knee, tibia / fibula, and ankle on the left, urgent outpt venous duplex ultrasound immediately after this visit to eval for DVT, and in-office aspiration of fluid collection to eval for hematoma or abscess - if  aspiration returned blood, planned allowing time for resolution of hematoma; if aspiration returned pus, planned I&D +/- packing, abx, and close f/u - if DVT found, would have recommended admission for further work-up / treatment  When brought to my attention that this is a worker's comp claim, it was relayed to me that without prior approval from worker's comp for the LE [place] study the pt might receive a bill for $1400. - Pt's lawyer's office was contacted and recommended asking pt to proceed to the ED for immediate work-up for possible DVT rather than waiting for approval - the above was precepted with Dr. Ree Kida, who reviewed the US findings and agreed with concern for DVT as well as referral to the ED for further eval  Emmaline Kluver, MD PGY-3, [facility] 11/14/2014, 4:58 PM

## 2014-11-14 NOTE — Patient Instructions (Signed)
Please proceed to the emergency room and show the provider there this paper.  Mr. Armada has increased swelling, warmth, and pain in his left lower leg after a fall at work. As this is a worker's comp issue, at the recommendation of his lawyer, I am asking him to be evaluated in the ED (emergency care to be done in place of waiting for prior approval from worker's comp). I am concerned for possible DVT and recommend a lower extremity venous duplex ultrasound. I would also recommend repeat xrays to check for nondisplaced fractures not seen previously. For the area of redness / swelling at his knee, I was preparing to do an aspiration -- if this came back as blood, I planned to let the hematoma resolve, and if it came back as pus, I planned to do an I&D and put him on antibiotics. I will defer to the ED provider. If there are any questions, please call [phone] (the family medicine clinic). The above was precepted with attending Dr. Ree Kida. --Dr. Venetia Maxon (FM 3rd year resident)

## 2014-11-15 NOTE — Progress Notes (Signed)
I personally saw and evaluated the patient. The plan of care was discussed with the resident team. I agree with the assessment and plan as documented by the resident.   Dossie Arbour MD

## 2014-11-17 ENCOUNTER — Ambulatory Visit (INDEPENDENT_AMBULATORY_CARE_PROVIDER_SITE_OTHER): Payer: Worker's Compensation | Admitting: Family Medicine

## 2014-11-17 ENCOUNTER — Encounter: Payer: Self-pay | Admitting: Family Medicine

## 2014-11-17 ENCOUNTER — Ambulatory Visit: Payer: Self-pay | Admitting: Family Medicine

## 2014-11-17 VITALS — BP 160/92 | HR 99 | Temp 98.2°F | Ht 71.0 in | Wt 210.4 lb

## 2014-11-17 DIAGNOSIS — L989 Disorder of the skin and subcutaneous tissue, unspecified: Secondary | ICD-10-CM

## 2014-11-17 DIAGNOSIS — T148XXA Other injury of unspecified body region, initial encounter: Secondary | ICD-10-CM | POA: Insufficient documentation

## 2014-11-17 NOTE — Progress Notes (Signed)
Patient ID: [name], male   DOB: [birth year], 49 y.o.   MRN: [id number]   HPI  Patient presents today for a lab left leg lesion  Patient was seen in the clinic with days ago and sent to the ER for concerns of DVT with left leg swelling and pain. In the ER venous duplex was performed and found to be negative. He had a large hematoma after his recent accident which was lanced and several clots were removed.  Since that time the swelling in his leg is improved, is tolerating antibiotics easily. In he's been changing the dressing on his own. He denies fever, chills, sweats, change in by mouth tolerance, dyspnea, or chest pain.  States that his leg is much better than prior to the incision and drainage  He also states that he has not been taking his blood pressure medicine, but he filled it today as his wife just Paid. No headache, chest pain, dyspnea, + leg edema as above.  His rib pain is manageable with current meds.  ROS: Per HPI  Objective: BP 160/92 mmHg  Pulse 99  Temp(Src) 98.2 F (36.8 C) (Oral)  Ht 5\' 11"  (1.803 m)  Wt 210 lb 6.4 oz (95.437 kg)  BMI 29.36 kg/m2 Gen: NAD, alert, cooperative with exam HEENT: NCAT CV: RRR, good S1/S2, no murmur Resp: CTABL, no wheezes, non-labored Ext: Left lower extremity with 1+ pitting edema, right lower extremity no edema, left lower extremity with approximately 1/2 cm incision draining minimal to moderate sanguinous drainage, bandaging place with sanguinous drainage, no tenderness/heat, slight surrounding induration Neuro: Alert and oriented, No gross deficits  Assessment and plan:  Leg skin lesion, left Leg lesion after an I&D of a hematoma 2 days ago in the ER Signs of infection, patient tolerating antibiotics easily Some sanguinous drainage, change dressing Follow-up one week and PCP

## 2014-11-17 NOTE — Assessment & Plan Note (Signed)
Leg lesion after an I&D of a hematoma 2 days ago in the ER Signs of infection, patient tolerating antibiotics easily Some sanguinous drainage, change dressing Follow-up one week and PCP

## 2014-11-17 NOTE — Patient Instructions (Addendum)
Nice to meet you  The wound looks good, worrisome signs would be redness, heat, and increasing pain at the site. If you develop any of these or fever please seek medical help right away.   Follow-up with your PCP in one week

## 2014-12-08 ENCOUNTER — Encounter: Payer: Self-pay | Admitting: Family Medicine

## 2014-12-08 ENCOUNTER — Ambulatory Visit (INDEPENDENT_AMBULATORY_CARE_PROVIDER_SITE_OTHER): Payer: Worker's Compensation | Admitting: Family Medicine

## 2014-12-08 VITALS — BP 151/86 | HR 112 | Temp 98.4°F | Ht 71.0 in | Wt 205.8 lb

## 2014-12-08 DIAGNOSIS — F4322 Adjustment disorder with anxiety: Secondary | ICD-10-CM

## 2014-12-08 DIAGNOSIS — S2239XD Fracture of one rib, unspecified side, subsequent encounter for fracture with routine healing: Secondary | ICD-10-CM

## 2014-12-08 DIAGNOSIS — T1490 Injury, unspecified: Secondary | ICD-10-CM | POA: Diagnosis not present

## 2014-12-08 DIAGNOSIS — W1789XA Other fall from one level to another, initial encounter: Secondary | ICD-10-CM

## 2014-12-08 MED ORDER — OXYCODONE-ACETAMINOPHEN 10-325 MG PO TABS: 1.0000 | ORAL_TABLET | Freq: Three times a day (TID) | ORAL | Status: DC | PRN

## 2014-12-08 MED ORDER — MECLIZINE HCL 32 MG PO TABS: 32.0000 mg | ORAL_TABLET | Freq: Three times a day (TID) | ORAL | Status: DC | PRN

## 2014-12-08 NOTE — Patient Instructions (Addendum)
-   I would recommend that you call [facility]:  [phone]. They should have openings for counseling therapy.  - Take percocet as needed for pain and meclizine as needed for dizziness.  - We will follow up in 4 weeks to discuss how you're doing.  - Follow up with your PCP for high blood pressure and diabetes management.

## 2014-12-08 NOTE — Progress Notes (Signed)
Subjective: [name] is a 49 y.o. male patient of Dr. Lucita Lora presenting for follow up of multiple fractures related to a fall at work. I last saw him for this 11/04/14.  He reports improved pain globally, and specifically in his ribs bilaterally. His back still hurts at baseline with worsening with movement. He has not lifted anything heavy. He is able to ambulate and has been doing so more lately with the help of prescription analgesics. No other traumas or injuries. Has been sleeping on his back only due to pain when he sleeps any other way. This is limiting the quality of his sleep.  He has also had significant vertigo upon laying down and some lightheadedness upon getting up. This feels like the room spinning and is associated with some nausea but no emesis.  He reports frequent terrifying dreams related to falling from heights. This concerns him as he has never had a fear of heights and is unable to be on his elevated back porch now. His mood is "fine" but staying at home all day and the financial strain from not working are significant. He has no history of depression, but desires a referral to "talk to someone" about these issues. His interpersonal interactions with his wife and family are at times more strained from this pressure as well. No SI/HI at all.   - ROS: Per HPI, additionally no cough, dyspnea, chest pain, abd pain, change in bowel habits.   Objective: BP 151/86 mmHg  Pulse 112  Temp(Src) 98.4 F (36.9 C) (Oral)  Ht 5\' 11"  (1.803 m)  Wt 205 lb 12.8 oz (93.35 kg)  BMI 28.72 kg/m2 Gen: Well-appearing 49 y.o. male in some considerable pain. HEENT: MMM, posterior oropharynx clear, normal alignment. Pulm: Non-labored, pain with deep respirations; CTAB, no wheezes  CV: Regular rate, no murmur appreciated; distal pulses intact/symmetric GI: +BS; soft, non-tender, non-distended Skin: No rashes, wounds, ulcers MSK: Normal gait and station; no digital clubbing/cyanosis, no frank  joint deformity/effusions Back: Very faint ecchymosis overlying lateral left and right sided ribs. Spine with normal alignment and no deformity. No tenderness to vertebral process palpation. Paraspinous muscles are tender with significant spasm. Range of motion is full at neck and diminished at lumbar sacral regions. Neuro:  Sensation and motor function 5/5 bilateral lower extremities. Patellar and achilles DTR's 2+ Psych: Neatly groomed and appropriately dressed. Maintains good eye contact and is cooperative and attentive. Speech is normal volume and rate. Mood is depressed with a mildly restricted affect. Thought process is logical and goal directed. No suicidal or homicidal ideation. Does not appear to be responding to any internal stimuli.  Assessment/Plan: [name] is a 49 y.o. male here for follow up of fall.  See problem list for plan.

## 2014-12-09 DIAGNOSIS — F4322 Adjustment disorder with anxiety: Secondary | ICD-10-CM | POA: Insufficient documentation

## 2014-12-09 DIAGNOSIS — W1789XA Other fall from one level to another, initial encounter: Secondary | ICD-10-CM | POA: Insufficient documentation

## 2014-12-09 NOTE — Assessment & Plan Note (Signed)
Discussed normal progression of adjustment to traumatic events, and the healthy ways to cope. Referred to [facility] Would not start pharmacotherapy for many months unless symptoms worsened.

## 2014-12-09 NOTE — Assessment & Plan Note (Addendum)
Continues to improve over several weeks still with considerable physical disability. Suspect this to continue for at least another 4 weeks. Will follow up at that time. No current indication for further diagnostics. Just continue with rest, muscle relaxer, analgesia.

## 2015-01-04 ENCOUNTER — Encounter: Payer: Self-pay | Admitting: Family Medicine

## 2015-01-04 ENCOUNTER — Ambulatory Visit (INDEPENDENT_AMBULATORY_CARE_PROVIDER_SITE_OTHER): Payer: Worker's Compensation | Admitting: Family Medicine

## 2015-01-04 VITALS — BP 157/97 | HR 94 | Temp 97.5°F | Ht 71.0 in | Wt 200.8 lb

## 2015-01-04 DIAGNOSIS — W1789XA Other fall from one level to another, initial encounter: Secondary | ICD-10-CM

## 2015-01-04 DIAGNOSIS — T148 Other injury of unspecified body region: Secondary | ICD-10-CM | POA: Diagnosis not present

## 2015-01-04 DIAGNOSIS — S2239XD Fracture of one rib, unspecified side, subsequent encounter for fracture with routine healing: Secondary | ICD-10-CM | POA: Diagnosis not present

## 2015-01-04 DIAGNOSIS — T1490 Injury, unspecified: Secondary | ICD-10-CM

## 2015-01-04 DIAGNOSIS — T148XXA Other injury of unspecified body region, initial encounter: Secondary | ICD-10-CM

## 2015-01-04 MED ORDER — OXYCODONE-ACETAMINOPHEN 10-325 MG PO TABS: 1.0000 | ORAL_TABLET | Freq: Three times a day (TID) | ORAL | Status: DC | PRN

## 2015-01-04 MED ORDER — CYCLOBENZAPRINE HCL 10 MG PO TABS: 10.0000 mg | ORAL_TABLET | Freq: Three times a day (TID) | ORAL | Status: DC | PRN

## 2015-01-04 NOTE — Assessment & Plan Note (Signed)
Has made interval improvement but ongoing pain in his trunk. He is continuing breathing exercises. Back pain is also still significant. Letter written affirming 4 weeks more for recovery.

## 2015-01-04 NOTE — Progress Notes (Signed)
Subjective: [name] is a 49 y.o. male patient of Dr. Lucita Lora presenting for follow up of trauma related to fall at work. Date of injury: 10/28/2014.   Still with significant pain and mostly bored of being at home. Works on deep breathing and plays pan flute to help with this. Has pain related to old evacuated hematoma site on left leg, which is getting bigger very slowly.   - ROS: No fevers, chest pain, cough, shortness of breath.  Objective: BP 157/97 mmHg  Pulse 94  Temp(Src) 97.5 F (36.4 C) (Oral)  Ht 5\' 11"  (1.803 m)  Wt 200 lb 12.8 oz (91.082 kg)  BMI 28.02 kg/m2 Gen: Pleasant 49 y.o. male in some pain with movements CV: RRR, no murmur Pulm: Nonlabored, sufficient and symmetrical expansion, CTAB MSK: Spine with no deformities, no midline tenderness. Significant paraspinal spasm and tenderness. Significant tenderness overlaying lateral ribs without crepitus.  LLE: Round indurated area below and medial to left knee. No fluctuance. Somewhat tender to palpation without surrounding erythema or limitation of knee ROM.  Neuro: Antalgic gait, no focal deficits.   Assessment/Plan: Shaker Sarker is a 49 y.o. male here for continued follow up of trauma with broken ribs.  See problem list for plan.

## 2015-01-04 NOTE — Assessment & Plan Note (Signed)
of left lower leg. Improved from original size. Some pain but no concern for deep venous thrombosis. Recommended compression and monitoring. Could consider re-incision if continues to expand.

## 2015-01-04 NOTE — Patient Instructions (Addendum)
-   We will just keep an eye on your leg. - Take percocet as needed for pain and flexeril as needed for muscle scpasms.  - We will follow up in 4 weeks to discuss how you're doing.  - Follow up with your PCP for high blood pressure and diabetes management.

## 2015-01-16 ENCOUNTER — Ambulatory Visit: Payer: Self-pay | Admitting: Family Medicine

## 2015-01-18 ENCOUNTER — Emergency Department (HOSPITAL_COMMUNITY)
Admission: EM | Admit: 2015-01-18 | Discharge: 2015-01-18 | Disposition: A | Discharge status: Home / Self Care | Payer: Self-pay | Attending: Emergency Medicine | Admitting: Emergency Medicine

## 2015-01-18 ENCOUNTER — Encounter (HOSPITAL_COMMUNITY): Payer: Self-pay | Admitting: Emergency Medicine

## 2015-01-18 DIAGNOSIS — IMO0001 Reserved for inherently not codable concepts without codable children: Secondary | ICD-10-CM

## 2015-01-18 DIAGNOSIS — Z87828 Personal history of other (healed) physical injury and trauma: Secondary | ICD-10-CM | POA: Insufficient documentation

## 2015-01-18 DIAGNOSIS — Z8619 Personal history of other infectious and parasitic diseases: Secondary | ICD-10-CM | POA: Insufficient documentation

## 2015-01-18 DIAGNOSIS — Z87891 Personal history of nicotine dependence: Secondary | ICD-10-CM | POA: Insufficient documentation

## 2015-01-18 DIAGNOSIS — I10 Essential (primary) hypertension: Secondary | ICD-10-CM | POA: Insufficient documentation

## 2015-01-18 DIAGNOSIS — Z79899 Other long term (current) drug therapy: Secondary | ICD-10-CM | POA: Insufficient documentation

## 2015-01-18 DIAGNOSIS — L0201 Cutaneous abscess of face: Secondary | ICD-10-CM | POA: Insufficient documentation

## 2015-01-18 DIAGNOSIS — E119 Type 2 diabetes mellitus without complications: Secondary | ICD-10-CM | POA: Insufficient documentation

## 2015-01-18 DIAGNOSIS — Z794 Long term (current) use of insulin: Secondary | ICD-10-CM | POA: Insufficient documentation

## 2015-01-18 DIAGNOSIS — L0291 Cutaneous abscess, unspecified: Secondary | ICD-10-CM

## 2015-01-18 DIAGNOSIS — R030 Elevated blood-pressure reading, without diagnosis of hypertension: Secondary | ICD-10-CM

## 2015-01-18 DIAGNOSIS — Z91040 Latex allergy status: Secondary | ICD-10-CM | POA: Insufficient documentation

## 2015-01-18 MED ORDER — SULFAMETHOXAZOLE-TRIMETHOPRIM 800-160 MG PO TABS: 1.0000 | ORAL_TABLET | Freq: Two times a day (BID) | ORAL | Status: AC

## 2015-01-18 MED ORDER — LIDOCAINE-EPINEPHRINE 2 %-1:100000 IJ SOLN
20.0000 mL | Freq: Once | INTRAMUSCULAR | Status: DC
  Filled 2015-01-18: qty 20

## 2015-01-18 MED ORDER — HYDROCODONE-ACETAMINOPHEN 5-325 MG PO TABS
2.0000 | ORAL_TABLET | Freq: Once | ORAL | Status: AC
  Administered 2015-01-18: 2 via ORAL
  Filled 2015-01-18: qty 2

## 2015-01-18 NOTE — ED Notes (Signed)
Ben,PA at the bedside.

## 2015-01-18 NOTE — ED Notes (Signed)
Pt reports he shaved Sunday and noticed a bump that evening on the left side of his face along his jaw line, pt reports the bump has continued to grow and increase in pain, denies fever, drainage from wound, SOB, or difficulty speaking/swallowing. A&O X4. Also reports hx of HTN, has not taking BP medications in awhile.

## 2015-01-18 NOTE — Discharge Instructions (Signed)
Abscess An abscess is an infected area that contains a collection of pus and debris.It can occur in almost any part of the body. An abscess is also known as a furuncle or boil. CAUSES  An abscess occurs when tissue gets infected. This can occur from blockage of oil or sweat glands, infection of hair follicles, or a minor injury to the skin. As the body tries to fight the infection, pus collects in the area and creates pressure under the skin. This pressure causes pain. People with weakened immune systems have difficulty fighting infections and get certain abscesses more often.  SYMPTOMS Usually an abscess develops on the skin and becomes a painful mass that is red, warm, and tender. If the abscess forms under the skin, you may feel a moveable soft area under the skin. Some abscesses break open (rupture) on their own, but most will continue to get worse without care. The infection can spread deeper into the body and eventually into the bloodstream, causing you to feel ill.  DIAGNOSIS  Your caregiver will take your medical history and perform a physical exam. A sample of fluid may also be taken from the abscess to determine what is causing your infection. TREATMENT  Your caregiver may prescribe antibiotic medicines to fight the infection. However, taking antibiotics alone usually does not cure an abscess. Your caregiver may need to make a small cut (incision) in the abscess to drain the pus. In some cases, gauze is packed into the abscess to reduce pain and to continue draining the area. HOME CARE INSTRUCTIONS   Only take over-the-counter or prescription medicines for pain, discomfort, or fever as directed by your caregiver.  If you were prescribed antibiotics, take them as directed. Finish them even if you start to feel better.  If gauze is used, follow your caregiver's directions for changing the gauze.  To avoid spreading the infection:  Keep your draining abscess covered with a  bandage.  Wash your hands well.  Do not share personal care items, towels, or whirlpools with others.  Avoid skin contact with others.  Keep your skin and clothes clean around the abscess.  Keep all follow-up appointments as directed by your caregiver. SEEK MEDICAL CARE IF:   You have increased pain, swelling, redness, fluid drainage, or bleeding.  You have muscle aches, chills, or a general ill feeling.  You have a fever. MAKE SURE YOU:   Understand these instructions.  Will watch your condition.  Will get help right away if you are not doing well or get worse. Document Released: 07/03/2005 Document Revised: 03/24/2012 Document Reviewed: 12/06/2011 [facility] Patient Information 2015 [place], [place]. This information is not intended to replace advice given to you by your health care provider. Make sure you discuss any questions you have with your health care provider.  Please take all of your antibiotics as directed. Return to ED or other health care facility in 3 days for reevaluation/wound check. Return sooner if symptoms worsen or new symptoms arise. He may also follow-up with [facility] and wellness in order to establish primary care.

## 2015-01-18 NOTE — ED Provider Notes (Signed)
CSN: [id number]     Arrival date & time 01/18/15  [id number] History   First MD Initiated Contact with Patient 01/18/15 0636     Chief Complaint  Patient presents with  . Abscess     (Consider location/radiation/quality/duration/timing/severity/associated sxs/prior Treatment) HPI [name] is a 49 y.o. male with history of diabetes comes in for evaluation of face abscess. Patient states on Sunday after shaving he noticed a red bump on the right side of his face that has gotten progressively larger, more red and painful. He reports his discomfort does radiate throughout the right side of his face. He reports he has had this experience before. He has tried using warm compresses in order to get him to come to a head, but this has not relieved his discomfort. He reports his discomfort as an 8/10 at this time. Denies any fevers, chills, headache, nausea or vomiting, chest pain, shortness of breath, sore throat, ear pain.   Past Medical History  Diagnosis Date  . Hypertension   . Diabetes mellitus     Type 2 DM  x 24 years  . Foreign body in left foot     with  infection   Past Surgical History  Procedure Laterality Date  . No past surgeries    . I&d extremity  04/23/2012    Procedure: IRRIGATION AND DEBRIDEMENT EXTREMITY;  Surgeon: Newt Minion, MD;  Location: [facility];  Service: Orthopedics;  Laterality: Left;  Irrigation and Debridement Left Foot  . I&d extremity  09/18/2012    Procedure: IRRIGATION AND DEBRIDEMENT EXTREMITY;  Surgeon: Newt Minion, MD;  Location: [facility];  Service: Orthopedics;  Laterality: Left;   Family History  Problem Relation Age of Onset  . Diabetes type II Mother   . Diabetes type II Sister    History  Substance Use Topics  . Smoking status: Former Smoker    Types: Cigarettes    Quit date: 09/03/2012  . Smokeless tobacco: Former Systems developer    Quit date: 09/03/2012  . Alcohol Use: 4.8 oz/week    6 Cans of beer, 2 Shots of liquor per week     Comment: weekends.     Review of Systems  Constitutional: Negative for fever.  Respiratory: Negative for shortness of breath.   Cardiovascular: Negative for chest pain.  Skin: Positive for wound. Negative for rash.       Facial abscess      Allergies  Latex  Home Medications   Prior to Admission medications   Medication Sig Start Date End Date Taking? Authorizing Provider  cyclobenzaprine (FLEXERIL) 10 MG tablet Take 1 tablet (10 mg total) by mouth 3 (three) times daily as needed for muscle spasms. 01/04/15  Yes Patrecia Pour, MD  insulin aspart (NOVOLOG) 100 UNIT/ML injection Use sliding scale twice a day with meals Patient taking differently: Inject 6 Units into the skin 2 (two) times daily with breakfast and lunch. Use sliding scale twice a day with meals 03/05/14  Yes Tatyana Kirichenko, PA-C  insulin glargine (LANTUS) 100 UNIT/ML injection Inject 0.26 mLs (26 Units total) into the skin at bedtime. 03/05/14  Yes Tatyana Kirichenko, PA-C  lisinopril (PRINIVIL,ZESTRIL) 20 MG tablet Take 1 tablet (20 mg total) by mouth daily. 11/14/14  Yes Sharon [address], MD  metFORMIN (GLUCOPHAGE) 500 MG tablet Take 1 tablet (500 mg total) by mouth 2 (two) times daily with a meal. 03/05/14  Yes Tatyana Kirichenko, PA-C  insulin glargine (LANTUS) 100 UNIT/ML injection Inject 0.26 mLs (26  Units total) into the skin daily. Patient not taking: Reported on 01/18/2015 03/29/13   Vivi Ferns, MD  meclizine (ANTIVERT) 32 MG tablet Take 1 tablet (32 mg total) by mouth 3 (three) times daily as needed. Patient not taking: Reported on 01/18/2015 12/08/14   Patrecia Pour, MD  oxyCODONE-acetaminophen (PERCOCET) 10-325 MG per tablet Take 1 tablet by mouth every 8 (eight) hours as needed for pain. Patient not taking: Reported on 01/18/2015 01/04/15   Patrecia Pour, MD  polyethylene glycol powder (GLYCOLAX/MIRALAX) powder Take 17 g by mouth 2 (two) times daily as needed for moderate constipation. Take as needed to produce 1 normal bowel  movement per day. Patient not taking: Reported on 01/18/2015 11/04/14   Patrecia Pour, MD  sulfamethoxazole-trimethoprim (BACTRIM DS,SEPTRA DS) 800-160 MG per tablet Take 1 tablet by mouth 2 (two) times daily. 01/18/15 01/25/15  [name] Lakie Mclouth, PA-C   BP 183/96 mmHg  Pulse 95  Temp(Src) 97.5 F (36.4 C) (Oral)  Resp 18  Ht 5\' 11"  (1.803 m)  Wt 205 lb (92.987 kg)  BMI 28.60 kg/m2  SpO2 100% Physical Exam  Constitutional:  Awake, alert, nontoxic appearance.  HENT:  Head: Atraumatic.  Area of erythema and induration approximately 3 cm in diameter. No drainage or oozing noted. No fluctuance  Eyes: Right eye exhibits no discharge. Left eye exhibits no discharge.  Neck: Neck supple.  Pulmonary/Chest: Effort normal. He exhibits no tenderness.  Abdominal: Soft. There is no tenderness. There is no rebound.  Musculoskeletal: He exhibits no tenderness.  Baseline ROM, no obvious new focal weakness.  Neurological:  Mental status and motor strength appears baseline for patient and situation.  Skin: No rash noted.  Psychiatric: He has a normal mood and affect.  Nursing note and vitals reviewed.   ED Course  Procedures (including critical care time) EMERGENCY DEPARTMENT US SOFT TISSUE INTERPRETATION "Study: Limited Ultrasound of the noted body part in comments below"  INDICATIONS: Soft tissue infection Multiple views of the body part are obtained with a multi-frequency linear probe  PERFORMED BY:  Myself  IMAGES ARCHIVED?: No  SIDE:Right   BODY PART:Other soft tisse (comment in note)  FINDINGS: Abcess present  LIMITATIONS:  Body Habitus  INTERPRETATION:  Abcess present  COMMENT:  Abscess located to right jaw approximately at angle of the mandible. Attempted to save/archived pictures, but space is full and unable to save pictures at this time.  INCISION AND DRAINAGE Performed by: Verl Dicker Consent: Verbal consent obtained. Risks and benefits: risks, benefits and  alternatives were discussed Type: abscess  Body area: Right jaw  Anesthesia: local infiltration  Incision was made with a scalpel.  Local anesthetic: lidocaine 2 % with epinephrine  Anesthetic total: 3 ml  Complexity: complex Blunt dissection to break up loculations  Drainage: purulent  Drainage amount: Mild to moderate   Packing material: none  Patient tolerance: Patient tolerated the procedure well with no immediate complications.      Labs Review Labs Reviewed - No data to display  Imaging Review No results found.   EKG Interpretation None     Meds given in ED:  Medications  lidocaine-EPINEPHrine (XYLOCAINE W/EPI) 2 %-1:100000 (with pres) injection 20 mL (not administered)  HYDROcodone-acetaminophen (NORCO/VICODIN) 5-325 MG per tablet 2 tablet (2 tablets Oral Given 01/18/15 0728)    New Prescriptions   SULFAMETHOXAZOLE-TRIMETHOPRIM (BACTRIM DS,SEPTRA DS) 800-160 MG PER TABLET    Take 1 tablet by mouth 2 (two) times daily.   Filed Vitals:  01/18/15 0627  BP: 183/96  Pulse: 95  Temp: 97.5 F (36.4 C)  TempSrc: Oral  Resp: 18  Height: 5\' 11"  (1.803 m)  Weight: 205 lb (92.987 kg)  SpO2: 100%    MDM  Vitals stable  -afebrile Pt resting comfortably in ED. PE-- abscess to right jaw. Drained at bedside by myself. Patient tolerated procedure well. Sterile dressing applied with topical antibiotics. Due to diabetes/immunocompromised state Will DC with antibiotics. Discussed wound check in 3 days or sooner if symptoms worsen or if new symptoms arise. Also discussed follow-up with primary care/[facility] and wellness for further evaluation and management of high blood pressure. No evidence of hypertensive emergency.  I discussed all relevant lab findings and imaging results with pt and they verbalized understanding. Discussed f/u with PCP within 48 hrs and return precautions, pt very amenable to plan.  Final diagnoses:  Abscess  Elevated blood pressure         Comer Locket, PA-C 01/18/15 [id number]  Evelina Bucy, MD 01/18/15 [phone]

## 2015-01-26 ENCOUNTER — Emergency Department (HOSPITAL_COMMUNITY): Admission: EM | Admit: 2015-01-26 | Discharge: 2015-01-26 | Payer: Self-pay

## 2015-01-27 ENCOUNTER — Ambulatory Visit
Admission: RE | Admit: 2015-01-27 | Discharge: 2015-01-27 | Disposition: A | Discharge status: Home / Self Care | Payer: Worker's Compensation | Source: Ambulatory Visit | Attending: Family Medicine | Admitting: Family Medicine

## 2015-01-27 ENCOUNTER — Ambulatory Visit (INDEPENDENT_AMBULATORY_CARE_PROVIDER_SITE_OTHER): Payer: Worker's Compensation | Admitting: Family Medicine

## 2015-01-27 ENCOUNTER — Encounter: Payer: Self-pay | Admitting: Family Medicine

## 2015-01-27 VITALS — BP 136/87 | HR 103 | Temp 98.3°F | Wt 200.0 lb

## 2015-01-27 DIAGNOSIS — R0781 Pleurodynia: Secondary | ICD-10-CM

## 2015-01-27 MED ORDER — OXYCODONE-ACETAMINOPHEN 5-325 MG PO TABS: 1.0000 | ORAL_TABLET | Freq: Three times a day (TID) | ORAL | Status: DC | PRN

## 2015-01-27 NOTE — Assessment & Plan Note (Signed)
Dislocation vs non traumatic refracture of the area - Will send for X-rays of this region to evaluate - Refill percocet 5-325 x 20 tablets today - F/U with PCP in 3 days

## 2015-01-27 NOTE — Progress Notes (Signed)
[name] is a 49 y.o. male who presents today for posterior rib pain.  Posterior rib pain - Pt with known hx of multiple rib fx from fall in [place] from work experience.  He fell off a ladder about 8 1/2-9 ft, and was evaluated in the ED there.  Had x-rays done there which showed these multiple fx.  Pt states yesterday he was going to change a light bulb and had some lightheaded feeling when he was on the ladder.  L knee felt weak, at which point he attempted to stabilize himself.  Felt pop in the back of his L thorax and has been having pain since then.  Not well controlled on medication and painful with laying on or sleeping.   Past Medical History  Diagnosis Date  . Hypertension   . Diabetes mellitus     Type 2 DM  x 24 years  . Foreign body in left foot     with  infection    History  Smoking status  . Former Smoker  . Types: Cigarettes  . Quit date: 09/03/2012  Smokeless tobacco  . Former Systems developer  . Quit date: 09/03/2012    Family History  Problem Relation Age of Onset  . Diabetes type II Mother   . Diabetes type II Sister     Current Outpatient Prescriptions on File Prior to Visit  Medication Sig Dispense Refill  . cyclobenzaprine (FLEXERIL) 10 MG tablet Take 1 tablet (10 mg total) by mouth 3 (three) times daily as needed for muscle spasms. 60 tablet 0  . insulin aspart (NOVOLOG) 100 UNIT/ML injection Use sliding scale twice a day with meals (Patient taking differently: Inject 6 Units into the skin 2 (two) times daily with breakfast and lunch. Use sliding scale twice a day with meals) 10 mL 3  . insulin glargine (LANTUS) 100 UNIT/ML injection Inject 0.26 mLs (26 Units total) into the skin daily. (Patient not taking: Reported on 01/18/2015) 10 mL 99  . insulin glargine (LANTUS) 100 UNIT/ML injection Inject 0.26 mLs (26 Units total) into the skin at bedtime. 10 mL 3  . lisinopril (PRINIVIL,ZESTRIL) 20 MG tablet Take 1 tablet (20 mg total) by mouth daily. 30 tablet 1  .  meclizine (ANTIVERT) 32 MG tablet Take 1 tablet (32 mg total) by mouth 3 (three) times daily as needed. (Patient not taking: Reported on 01/18/2015) 30 tablet 0  . metFORMIN (GLUCOPHAGE) 500 MG tablet Take 1 tablet (500 mg total) by mouth 2 (two) times daily with a meal. 60 tablet 2  . oxyCODONE-acetaminophen (PERCOCET) 10-325 MG per tablet Take 1 tablet by mouth every 8 (eight) hours as needed for pain. (Patient not taking: Reported on 01/18/2015) 60 tablet 0  . polyethylene glycol powder (GLYCOLAX/MIRALAX) powder Take 17 g by mouth 2 (two) times daily as needed for moderate constipation. Take as needed to produce 1 normal bowel movement per day. (Patient not taking: Reported on 01/18/2015) 3350 g 1   No current facility-administered medications on file prior to visit.    ROS: Per HPI.  All other systems reviewed and are negative.   Physical Exam Filed Vitals:   01/27/15 1057  BP: 136/87  Pulse: 103  Temp: 98.3 F (36.8 C)    Physical Examination: General appearance - alert, well appearing, and in no distress Back exam - L 5th/6th rib protrusion with TTP around this area posterior thorax, lateral to spinous processes.  No Spinal process TTP.  Full ROM and neurovascular intact  B/L LE

## 2015-01-30 ENCOUNTER — Ambulatory Visit (INDEPENDENT_AMBULATORY_CARE_PROVIDER_SITE_OTHER): Payer: Worker's Compensation | Admitting: Family Medicine

## 2015-01-30 ENCOUNTER — Encounter: Payer: Self-pay | Admitting: Family Medicine

## 2015-01-30 ENCOUNTER — Telehealth: Payer: Self-pay | Admitting: Family Medicine

## 2015-01-30 VITALS — BP 135/76 | HR 98 | Ht 71.0 in | Wt 202.1 lb

## 2015-01-30 DIAGNOSIS — IMO0002 Reserved for concepts with insufficient information to code with codable children: Secondary | ICD-10-CM

## 2015-01-30 DIAGNOSIS — S2239XD Fracture of one rib, unspecified side, subsequent encounter for fracture with routine healing: Secondary | ICD-10-CM

## 2015-01-30 DIAGNOSIS — K5909 Other constipation: Secondary | ICD-10-CM

## 2015-01-30 DIAGNOSIS — R0781 Pleurodynia: Secondary | ICD-10-CM

## 2015-01-30 DIAGNOSIS — G8929 Other chronic pain: Secondary | ICD-10-CM

## 2015-01-30 DIAGNOSIS — Z7189 Other specified counseling: Secondary | ICD-10-CM | POA: Diagnosis not present

## 2015-01-30 DIAGNOSIS — T402X5A Adverse effect of other opioids, initial encounter: Secondary | ICD-10-CM

## 2015-01-30 DIAGNOSIS — E1165 Type 2 diabetes mellitus with hyperglycemia: Secondary | ICD-10-CM | POA: Diagnosis not present

## 2015-01-30 DIAGNOSIS — K5903 Drug induced constipation: Secondary | ICD-10-CM

## 2015-01-30 LAB — POCT GLYCOSYLATED HEMOGLOBIN (HGB A1C): HEMOGLOBIN A1C: 10.8

## 2015-01-30 MED ORDER — LISINOPRIL 20 MG PO TABS: 20.0000 mg | ORAL_TABLET | Freq: Every day | ORAL | Status: DC

## 2015-01-30 MED ORDER — POLYETHYLENE GLYCOL 3350 17 GM/SCOOP PO POWD: 17.0000 g | Freq: Two times a day (BID) | ORAL | Status: DC | PRN

## 2015-01-30 MED ORDER — INSULIN DETEMIR 100 UNIT/ML ~~LOC~~ SOLN: 10.0000 [IU] | Freq: Two times a day (BID) | SUBCUTANEOUS | Status: DC

## 2015-01-30 MED ORDER — OXYCODONE-ACETAMINOPHEN 10-325 MG PO TABS: 1.0000 | ORAL_TABLET | Freq: Two times a day (BID) | ORAL | Status: DC | PRN

## 2015-01-30 MED ORDER — METFORMIN HCL 500 MG PO TABS: 500.0000 mg | ORAL_TABLET | Freq: Two times a day (BID) | ORAL | Status: DC

## 2015-01-30 NOTE — Telephone Encounter (Signed)
Discussed results of X-ray showing 10th rib fx with pt.  He is still in pain and from description sounds like a low impact fx or refracture.  Would consider 25 OH vit D testing and possibly DEXA for fragility fx, however could insinuate that this is a re-fx from his previous fall 2 months ago.  To see PCP today, at which point pt states he will discuss management options and further evaluation.  Thanks [employer]. Awanda Mink, DO of [facility] 01/30/2015, 8:59 AM

## 2015-01-30 NOTE — Progress Notes (Signed)
   Subjective:    Patient ID: [name], male    DOB: [birth year], 49 y.o.   MRN: [id number]  HPI  Left rib fracture: Patient presents to family medicine clinic for follow-up on acute left rib fracture. X-rays resulted with recurrent 10th rib fracture after losing his balance on a ladder and catching himself on the table. Patient continues to be in pain, with decreased range of motion, taking oxycodone for pain. He has been out of work since January for a fall from 9 feet, with multiple rib fractures at that time as well as other extremity trauma. Patient has an appointment with orthopedics to follow-up with his left extremity trauma. She has been avoiding taking Flexeril because it makes him too sleepy. Having difficulty falling asleep due to discomfort.  Hypertension: Patient takes lisinopril 20 mg daily for his blood pressure, currently controlled. Denies any chest pain, shortness breath or palpitations. No changes in vision.  Diabetes: Patient admits to not taking insulin in about a year, he states his prescription ran out and he has lost his insurance. He did just start taking his metformin again last week. Patient is currently without any type of insurance, except for [employer] for a work-related injury. His last creatinine was 1.11 in January 2016. He reports his blood sugars are anywhere from 200-400 fasting glucose in the morning. He used to take metformin on thousand milligrams twice a day, Lantus 26 units, and sliding scale NovoLog 6-10 units before breakfast and lunch. She denies any nonhealing wounds currently, he does admit to some mild tingling of his left upper extremity.  Former smoker Past Medical History  Diagnosis Date  . Hypertension   . Diabetes mellitus     Type 2 DM  x 24 years  . Foreign body in left foot     with  infection  . Multiple rib fractures    Allergies  Allergen Reactions  . Latex Rash    Pt states he is allergic to latex condoms   Past Surgical  History  Procedure Laterality Date  . No past surgeries    . I&d extremity  04/23/2012    Procedure: IRRIGATION AND DEBRIDEMENT EXTREMITY;  Surgeon: Newt Minion, MD;  Location: [facility];  Service: Orthopedics;  Laterality: Left;  Irrigation and Debridement Left Foot  . I&d extremity  09/18/2012    Procedure: IRRIGATION AND DEBRIDEMENT EXTREMITY;  Surgeon: Newt Minion, MD;  Location: [facility];  Service: Orthopedics;  Laterality: Left;   Review of Systems Per history of present illness    Objective:   Physical Exam BP 135/76 mmHg  Pulse 98  Ht 5\' 11"  (1.803 m)  Wt 202 lb 1.6 oz (91.672 kg)  BMI 28.20 kg/m2 Gen: NAD. Nontoxic in appearance, pleasant male, mildly overweight, appears uncomfortable HEENT: AT. [place].  Bilateral eyes without injections or icterus. CV: RRR  Ext: No erythema. No edema. Resolving hematoma left medial knee. Multiple hyperpigmented scars bilateral shins. Neurovascularly intact distally. MSK: No erythema, no edema, no soft tissue swelling. Guarded. Tenderness to palpation left thoracic area over palpable rib dislocation. Difficulty with all ranges of motion to secondary to pain.     Assessment & Plan:

## 2015-01-30 NOTE — Assessment & Plan Note (Signed)
Viewed x-rays, left refracture of 10th rib. Patient in pain today, encouraged him to continue 5 mg of Flexeril before bed, heat application for 20 minutes duration at a time. Refilled oxycodone 10-3 25, #60. Had him signed pain contract today. Encouraged him to use this for severe pain only, he can continue use Tylenol and Motrin in between. Advised him not to go over 3 g Tylenol day. Did not perform UDS at this time, patient is without insurance, will perform UDS once he becomes an orange card holder. Out of work for an additional 4 weeks, at this time we will reevaluate and hopefully get him back to work. Patient has been out of work since January. He does work with heavy machinery and heavy lifting, uncertain if we'll be able to get back to full duty. May need functional capacity evaluation, but currently he does not have insurance. Uncertain if this will be covered under his Worker's Comp., but patient was given information on orange card today, which would likely cover functional capacity eval if needed. Follow-up in 3 weeks

## 2015-01-30 NOTE — Assessment & Plan Note (Signed)
Pain contract signed today, patient needing oxycodone greater than 6 weeks secondary to multiple rib fractures, with repeat rib fracture. Weeks ago. Pain contract reviewed, weighted on urine sample secondary to no insurance at this time. Patient will apply for orange card, and then can complete UDS. Oxycodone 10-3 25, #60 was given today. He is to return in 4 weeks

## 2015-01-30 NOTE — Telephone Encounter (Signed)
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

## 2015-01-30 NOTE — Patient Instructions (Signed)
He will need to get a hold of Eusebio Friendly in the map program. Pamala Hurry will be able to help you with getting orange card, which is a charity card that'll help you with cost at this clinic and lab work. He will need to do this right away, information has been given you today to complete this. I have given you one vial of Levemir insulin, you will take 10 units twice a day. This will be taken in addition to your regular metformin dose which has been called in. I have also called in your lisinopril for your hypertension. I want you to follow-up in pharmacy clinic in 2 weeks, with fasting morning glucoses retina in a book for evaluation so that we may adjust her insulin. When she become an orange card holder, will be able to get your insulin for you. I have refilled your pain medications today, oxycodone 10-3 25, as well as mural asked to help with constipation. We had to sign a narcotics contract today, this is secondary to needing pain medication greater than 6 weeks. You are to not receive any narcotic medications outside of this clinic, if doing so that you will be violating her pain contract, and we will not provide you with narcotics any further.

## 2015-01-30 NOTE — Assessment & Plan Note (Addendum)
A1c 10.8 today. Levemir sample 1 vial, NDC [id number] 7-92. Lot  [id number]. Patient needs to contact Eusebio Friendly for orange card application. Map program information was given him today Once [place] card is available, we will need to do some baseline labs, last creatinine 1.11 in January 2016. Follow-up in pharmacy clinic for management 2 weeks.

## 2015-01-31 NOTE — Telephone Encounter (Signed)
LVM for Katie to call back to inform her of below

## 2015-01-31 NOTE — Telephone Encounter (Signed)
Received a message from front office staff from [employer]. There is no call back number or way to contact the adjuster in the phone note. The only medication that was related to his Workmans Comp claim is the oxycodone, the others are relate to chronic conditions. Please make her aware. Thanks.

## 2015-02-02 NOTE — Telephone Encounter (Signed)
LVM on secure phone line informing [name] of below message

## 2015-02-14 ENCOUNTER — Ambulatory Visit: Payer: Self-pay | Admitting: Pharmacist

## 2015-02-23 ENCOUNTER — Ambulatory Visit
Payer: Worker's Compensation | Attending: Orthopaedic Surgery | Admitting: Rehabilitative and Restorative Service Providers"

## 2015-02-23 ENCOUNTER — Encounter: Payer: Self-pay | Admitting: Rehabilitative and Restorative Service Providers"

## 2015-02-23 DIAGNOSIS — R531 Weakness: Secondary | ICD-10-CM | POA: Insufficient documentation

## 2015-02-23 DIAGNOSIS — R2681 Unsteadiness on feet: Secondary | ICD-10-CM | POA: Diagnosis not present

## 2015-02-23 DIAGNOSIS — R262 Difficulty in walking, not elsewhere classified: Secondary | ICD-10-CM | POA: Diagnosis not present

## 2015-02-23 DIAGNOSIS — M25562 Pain in left knee: Secondary | ICD-10-CM

## 2015-02-23 NOTE — Therapy (Signed)
[place], [place], [address] Phone: [phone]   Fax:  [phone]  Physical Therapy Evaluation  Patient Details  Name: [name] MRN: [id number] Date of Birth: 05-19-1966 Referring Provider:  Marybelle Killings, MD  Encounter Date: 02/23/2015      PT End of Session - 02/23/15 1706    Visit Number 1   Number of Visits 12   Date for PT Re-Evaluation 03/23/15   Authorization Type Workers Comp   Authorization - Visit Number 12   Authorization - Number of Visits 12   PT Start Time 1415   PT Stop Time 1515   PT Time Calculation (min) 60 min   Activity Tolerance Patient tolerated treatment well;Patient limited by pain      Past Medical History  Diagnosis Date  . Hypertension   . Diabetes mellitus     Type 2 DM  x 24 years  . Foreign body in left foot     with  infection  . Multiple rib fractures   . Allergy     Past Surgical History  Procedure Laterality Date  . No past surgeries    . I&d extremity  04/23/2012    Procedure: IRRIGATION AND DEBRIDEMENT EXTREMITY;  Surgeon: Newt Minion, MD;  Location: [facility];  Service: Orthopedics;  Laterality: Left;  Irrigation and Debridement Left Foot  . I&d extremity  09/18/2012    Procedure: IRRIGATION AND DEBRIDEMENT EXTREMITY;  Surgeon: Newt Minion, MD;  Location: [facility];  Service: Orthopedics;  Laterality: Left;  [name] Eye surgery      There were no vitals filed for this visit.  Visit Diagnosis:  Pain in joint, lower leg, left - Plan: PT plan of care cert/re-cert  Weakness - Plan: PT plan of care cert/re-cert  Difficulty in walking - Plan: PT plan of care cert/re-cert  Unsteadiness on feet - Plan: PT plan of care cert/re-cert      Subjective Assessment - 02/23/15 1428    Subjective 5-6/10 L LE pain increases with WB and decreases with rest and massage; pain more medially along distal hamstring and adductor; numbness increases in L LE with knee flexion  and DF; medial knee swelling   Pertinent History no significant PMH for L LE pain   Limitations Lifting;Standing;Walking;House hold activities   How long can you sit comfortably? every 2 hours must get up   How long can you stand comfortably? 1/2 hour or less   How long can you walk comfortably? 10-15 min   Diagnostic tests all diagonistic testing -   Patient Stated Goals "to get better and healed."   Currently in Pain? No/denies   Multiple Pain Sites Yes            [facility] PT Assessment - 02/23/15 0001    Assessment   Medical Diagnosis --  L LE contusion   Onset Date --  10/28/14   Next MD Visit --  02/27/15 11 AM   Precautions   Precautions --  can't lift over 20 lbs   Balance Screen   Has the patient fallen in the past 6 months --  10/28/14, 01/25/15   Has the patient had a decrease in activity level because of a fear of falling?  --  yes; balance and pain related   Is the patient reluctant to leave their home because of a fear of falling?  --  yes   Prior Function   Level of Independence --  I with all activities   Cognition   Overall Cognitive Status --  A & O x 4   Sensation   Light Touch Impaired Detail  decreased sensation L5 dermatome   Functional Tests   Functional tests --  squats asymmetrical with increased WB R LE   Posture/Postural Control   Posture/Postural Control --  decreased L LE WB with gait   ROM / Strength   AROM / PROM / Strength AROM;Strength   AROM   AROM Assessment Site Knee;Ankle   Right/Left Shoulder --   Right/Left Knee Left   Left Knee Extension 0   Left Knee Flexion 115   Right/Left Ankle Left   Left Ankle Dorsiflexion -23   Strength   Strength Assessment Site Hip;Knee   Right/Left Hip Left   Left Hip ABduction 3+/5   Left Hip ADduction 3/5   Right/Left Knee Left   Left Knee Flexion 4+/5   Left Knee Extension 4+/5   Flexibility   Soft Tissue Assessment /Muscle Length --  SLR + for Hamstring tightness, hip adductor tightness     Palpation   Palpation --  firmness to bruising upon L medial knee and gastroc   Ambulation/Gait   Ambulation/Gait Yes   Gait Pattern Decreased weight shift to left                           PT Education - 02/23/15 1704    Education provided Yes   Education Details HEP issued; discussed icing 10-15 min 2x/day; issued Quad sets, heel slide and Hamstring stretch supine with reps of exercises 10x and stretch 2x30 sec all 2x/day   Person(s) Educated Patient   Methods Explanation;Demonstration;Handout   Comprehension Verbalized understanding;Returned demonstration          PT Short Term Goals - 02/23/15 1710    PT SHORT TERM GOAL #1   Title Pt will report </= 3/10 L knee pain with standing x 30 min for rec activities   Baseline up to 6/10 L knee   Time 2   Period Weeks   Status New   PT SHORT TERM GOAL #2   Title Pt will be able to amb x 30 min with 50% less difficulty with L LE   Baseline 15 min   Time 2   Period Weeks   Status New   PT SHORT TERM GOAL #3   Title pt will be able to amb up/down curbs without feelings of knee buckling x 50% of the time   Baseline unable   Time 2   Period Weeks   Status New           PT Long Term Goals - 02/23/15 1712    PT LONG TERM GOAL #1   Title pt will report 75% improvement in all WB activities of L knee with pain   Baseline unable   Time 6   Period Weeks   Status New   PT LONG TERM GOAL #2   Title pt will be I with advanced HEP for knee pain and strengthening to assist with improved mobility   Baseline unmet   Time 6   Period Weeks   Status New   PT LONG TERM GOAL #3   Title pt will demo improved L LE strength 5/5 to assist with RTW   Baseline unmet   Time 6   Period Weeks   Status New  Plan - 02/23/15 1708    Clinical Impression Statement Pt demonstrates pain and limited ROM of L knee which hinders functional mobility. pt with limited ROM and abnormal patella mobs all  directions L LE.   Pt will benefit from skilled therapeutic intervention in order to improve on the following deficits Decreased range of motion;Difficulty walking;Impaired flexibility;Improper body mechanics;Decreased endurance;Increased edema;Impaired sensation;Pain   Rehab Potential Good   Clinical Impairments Affecting Rehab Potential pain   PT Frequency 3x / week   PT Duration 6 weeks   PT Treatment/Interventions Moist Heat;Therapeutic activities;Patient/family education;Passive range of motion;Therapeutic exercise;Ultrasound;Gait training;Balance training;Manual techniques;Cryotherapy;Neuromuscular re-education;Electrical Stimulation   PT Next Visit Plan Review HEP, hip adductor stretching, patella mobs, gentle LE strengthening, TENS   PT Home Exercise Plan HEP issued: see education section   Consulted and Agree with Plan of Care Patient         Problem List Patient Active Problem List   Diagnosis Date Noted  . Encounter for chronic pain management 01/30/2015  . Rib pain on left side 01/27/2015  . Injury resulting from fall from height 12/09/2014  . Adjustment disorder with anxiety 12/09/2014  . Hematoma 11/17/2014  . Traumatic ecchymosis of lower back 11/04/2014  . Traumatic ecchymosis of left ankle 11/04/2014  . Broken rib 10/28/2014  . Splinter of right foot 07/05/2014  . Pain in right hip 12/10/2012  . Erectile dysfunction 10/21/2012  . HTN (hypertension), benign 04/14/2012  . DM (diabetes mellitus), type 2, uncontrolled 04/13/2012    Myra Rude, PT, DPT 02/23/2015, 5:29 PM  [facility] [address] [place], [place], [address] Phone: [phone]   Fax:  [phone]

## 2015-02-27 ENCOUNTER — Encounter: Payer: Self-pay | Admitting: Family Medicine

## 2015-02-27 ENCOUNTER — Ambulatory Visit (INDEPENDENT_AMBULATORY_CARE_PROVIDER_SITE_OTHER): Payer: Self-pay | Admitting: Family Medicine

## 2015-02-27 VITALS — BP 130/82 | HR 68 | Temp 98.2°F | Ht 70.0 in | Wt 203.0 lb

## 2015-02-27 DIAGNOSIS — M79605 Pain in left leg: Secondary | ICD-10-CM | POA: Insufficient documentation

## 2015-02-27 DIAGNOSIS — S2239XD Fracture of one rib, unspecified side, subsequent encounter for fracture with routine healing: Secondary | ICD-10-CM

## 2015-02-27 MED ORDER — OXYCODONE-ACETAMINOPHEN 10-325 MG PO TABS: 1.0000 | ORAL_TABLET | Freq: Two times a day (BID) | ORAL | Status: DC | PRN

## 2015-02-27 NOTE — Assessment & Plan Note (Signed)
Continued rib pain and protrusion. Assessed by orthopedic surgeon recently and no intervention planned. We will continue to monitor and give analgesics as required.

## 2015-02-27 NOTE — Assessment & Plan Note (Signed)
Undergoing PT.

## 2015-02-27 NOTE — Patient Instructions (Signed)
Keep attending therapy and making the appointments for other providers.

## 2015-02-27 NOTE — Progress Notes (Signed)
Subjective: Aftab Nesseth is a 49 y.o. male patient of Dr. Lucita Lora presenting for follow up of trauma related to fall at work. Date of injury: 10/28/2014. He has filed for [employer].   Mr. Clary still complains of significant pain related mostly to left side of trunk where ribs were broken. This impedes sleep, but is helped with narcotic analgesics. Flexeril is not very helpful. He also has left leg pain and a resolving site of bruising on the left calf. He was seen at an orthopedic surgeon's office who took chest xrays and reportedly believed monitoring is all that was necessary. He will be seen by neurosurgery soon and has recently begun physical therapy twice weekly which is tough for him. He reports intermittent "going to sleep" of his left foot with numbness and tingling.   - ROS: No fevers, chest pain, cough, shortness of breath, or falling.  Objective: BP 130/82 mmHg  Pulse 68  Temp(Src) 98.2 F (36.8 C) (Oral)  Ht 5\' 10"  (1.778 m)  Wt 203 lb (92.08 kg)  BMI 29.13 kg/m2 Gen: Pleasant 49 y.o. male in some pain with movements CV: RRR, no murmur Pulm: Nonlabored, sufficient and symmetrical expansion, CTAB MSK: Spine with no deformities, no midline tenderness. Still significant paraspinal spasm and tenderness. Significant tenderness overlying protruding left lateral ribs without crepitus.  Left leg with improved induration over previous hematoma site, overlying numbness.  Neuro: Antalgic gait favoring left leg, no focal deficits. patellar DTRs symmetrically 2+. Sensation intact to monofilament testing on both feet.   Assessment/Plan: Aldie Soisson is a 49 y.o. male here for continued follow up of trauma with broken ribs.  See problem list for plan.

## 2015-03-13 ENCOUNTER — Ambulatory Visit: Payer: Worker's Compensation | Attending: Family Medicine | Admitting: Physical Therapy

## 2015-03-13 DIAGNOSIS — M25562 Pain in left knee: Secondary | ICD-10-CM

## 2015-03-13 DIAGNOSIS — R531 Weakness: Secondary | ICD-10-CM | POA: Insufficient documentation

## 2015-03-13 DIAGNOSIS — R2681 Unsteadiness on feet: Secondary | ICD-10-CM

## 2015-03-13 DIAGNOSIS — R262 Difficulty in walking, not elsewhere classified: Secondary | ICD-10-CM | POA: Diagnosis not present

## 2015-03-13 NOTE — Patient Instructions (Addendum)
Abduction: Clam (Eccentric) - Side-Lying   Lie on side with knees bent. Lift top knee, keeping feet together. Keep trunk steady. Slowly lower for 3-5 seconds. _10__ reps per set, _2__ sets per day, __7_ days per week. Add ___ lbs when you achieve ___ repetitions.  Copyright  VHI. All rights reserved.  ADDUCTION: Isometric   With ball between knees, squeeze them inward. Hold _5__ seconds. Complete _2__ sets of _10__ repetitions. Perform __2_ sessions per day.  http://gtsc.exer.us/124   Copyright  VHI. All rights reserved.  Bridge   Lie back, legs bent. Inhale, pressing hips up. Keeping ribs in, lengthen lower back. Exhale, rolling down along spine from top. Repeat __10__ times. Do __2__ sessions per day.  Copyright  VHI. All rights reserved.

## 2015-03-13 NOTE — Therapy (Signed)
[place], [place], [address] Phone: [phone]   Fax:  [phone]  Physical Therapy Treatment  Patient Details  Name: [name] MRN: [id number] Date of Birth: 09/14/1966 Referring Provider:  Ma Hillock, DO  Encounter Date: 03/13/2015      PT End of Session - 03/13/15 1435    Visit Number 2   Number of Visits 12   Date for PT Re-Evaluation 03/23/15   Authorization Type Workers Comp   Authorization - Visit Number 2   Authorization - Number of Visits 12   PT Start Time 0215   PT Stop Time 0310   PT Time Calculation (min) 55 min      Past Medical History  Diagnosis Date  . Hypertension   . Diabetes mellitus     Type 2 DM  x 24 years  . Foreign body in left foot     with  infection  . Multiple rib fractures   . Allergy     Past Surgical History  Procedure Laterality Date  . No past surgeries    . I&d extremity  04/23/2012    Procedure: IRRIGATION AND DEBRIDEMENT EXTREMITY;  Surgeon: Newt Minion, MD;  Location: [facility];  Service: Orthopedics;  Laterality: Left;  Irrigation and Debridement Left Foot  . I&d extremity  09/18/2012    Procedure: IRRIGATION AND DEBRIDEMENT EXTREMITY;  Surgeon: Newt Minion, MD;  Location: [facility];  Service: Orthopedics;  Laterality: Left;  [name] Eye surgery      There were no vitals filed for this visit.  Visit Diagnosis:  Pain in joint, lower leg, left  Weakness  Difficulty in walking  Unsteadiness on feet      Subjective Assessment - 03/13/15 1442    Currently in Pain? Yes   Pain Score 5    Pain Location Leg   Pain Orientation Left;Medial   Pain Descriptors / Indicators Burning;Throbbing;Tingling   Aggravating Factors  walking standing    Pain Relieving Factors rubbing it                         [facility] Adult PT Treatment/Exercise - 03/13/15 1437    Lumbar Exercises: Aerobic   Stationary Bike Nustep L 5 x 5 min LE only    Knee/Hip Exercises: Stretches   Active Hamstring Stretch 3 reps;30 seconds   Active Hamstring Stretch Limitations with strap   Knee/Hip Exercises: Supine   Quad Sets 10 reps   Heel Slides 10 reps   Bridges 10 reps   Other Supine Knee Exercises ball squeeze x 10   Other Supine Knee Exercises butterfly stretch 3 x 30   Knee/Hip Exercises: Sidelying   Clams x10   Modalities   Modalities Cryotherapy   Cryotherapy   Number Minutes Cryotherapy 15 Minutes   Cryotherapy Location Knee   Type of Cryotherapy Ice pack                PT Education - 03/13/15 1456    Education provided Yes   Education Details ball squeeze, sidelying clam, bridge   Person(s) Educated Patient   Methods Explanation;Handout   Comprehension Verbalized understanding          PT Short Term Goals - 02/23/15 1710    PT SHORT TERM GOAL #1   Title Pt will report </= 3/10 L knee pain with standing x 30 min for rec activities   Baseline up to 6/10 L  knee   Time 2   Period Weeks   Status New   PT SHORT TERM GOAL #2   Title Pt will be able to amb x 30 min with 50% less difficulty with L LE   Baseline 15 min   Time 2   Period Weeks   Status New   PT SHORT TERM GOAL #3   Title pt will be able to amb up/down curbs without feelings of knee buckling x 50% of the time   Baseline unable   Time 2   Period Weeks   Status New           PT Long Term Goals - 02/23/15 1712    PT LONG TERM GOAL #1   Title pt will report 75% improvement in all WB activities of L knee with pain   Baseline unable   Time 6   Period Weeks   Status New   PT LONG TERM GOAL #2   Title pt will be I with advanced HEP for knee pain and strengthening to assist with improved mobility   Baseline unmet   Time 6   Period Weeks   Status New   PT LONG TERM GOAL #3   Title pt will demo improved L LE strength 5/5 to assist with RTW   Baseline unmet   Time 6   Period Weeks   Status New               Plan - 03/13/15 1502     Clinical Impression Statement Reviewed pts HEP, corrected technique with hamstring stretch and added strap. Instructed pt in hip strengtheing to address deficits found on Eval and added to HEP. Pt c/o left foot N/T with repeated reps on all exercies today. He is limited by pain due to rib fractures.    PT Next Visit Plan Review HEP, hip adductor stretching, patella mobs, gentle LE strengthening, TENS/ PT HAS REFERRAL FOR [facility]-trying to get approval to come here        Problem List Patient Active Problem List   Diagnosis Date Noted  . Left leg pain 02/27/2015  . Encounter for chronic pain management 01/30/2015  . Rib pain on left side 01/27/2015  . Injury resulting from fall from height 12/09/2014  . Adjustment disorder with anxiety 12/09/2014  . Hematoma 11/17/2014  . Traumatic ecchymosis of lower back 11/04/2014  . Traumatic ecchymosis of left ankle 11/04/2014  . Broken rib 10/28/2014  . Splinter of right foot 07/05/2014  . Pain in right hip 12/10/2012  . Erectile dysfunction 10/21/2012  . HTN (hypertension), benign 04/14/2012  . DM (diabetes mellitus), type 2, uncontrolled 04/13/2012    Dorene Ar, PTA 03/13/2015, 3:20 PM  [facility] [address] [place], [place], [address] Phone: [phone]   Fax:  [phone]

## 2015-03-15 ENCOUNTER — Ambulatory Visit: Payer: Worker's Compensation | Admitting: Physical Therapy

## 2015-03-15 DIAGNOSIS — R2681 Unsteadiness on feet: Secondary | ICD-10-CM

## 2015-03-15 DIAGNOSIS — R531 Weakness: Secondary | ICD-10-CM

## 2015-03-15 DIAGNOSIS — M25562 Pain in left knee: Secondary | ICD-10-CM | POA: Diagnosis not present

## 2015-03-15 DIAGNOSIS — R262 Difficulty in walking, not elsewhere classified: Secondary | ICD-10-CM

## 2015-03-15 NOTE — Therapy (Signed)
[place], [place], [address] Phone: [phone]   Fax:  [phone]  Physical Therapy Treatment  Patient Details  Name: [name] MRN: [id number] Date of Birth: 17-Jan-1966 Referring Provider:  Ma Hillock, DO  Encounter Date: 03/15/2015      PT End of Session - 03/15/15 1454    Visit Number 3   Number of Visits 12   Date for PT Re-Evaluation 03/23/15   PT Start Time [id number]   PT Stop Time 1510   PT Time Calculation (min) 55 min   Activity Tolerance Patient tolerated treatment well   Behavior During Therapy [facility] for tasks assessed/performed      Past Medical History  Diagnosis Date  . Hypertension   . Diabetes mellitus     Type 2 DM  x 24 years  . Foreign body in left foot     with  infection  . Multiple rib fractures   . Allergy     Past Surgical History  Procedure Laterality Date  . No past surgeries    . I&d extremity  04/23/2012    Procedure: IRRIGATION AND DEBRIDEMENT EXTREMITY;  Surgeon: Newt Minion, MD;  Location: [place];  Service: Orthopedics;  Laterality: Left;  Irrigation and Debridement Left Foot  . I&d extremity  09/18/2012    Procedure: IRRIGATION AND DEBRIDEMENT EXTREMITY;  Surgeon: Newt Minion, MD;  Location: [place];  Service: Orthopedics;  Laterality: Left;  [name] Eye surgery      There were no vitals filed for this visit.  Visit Diagnosis:  Pain in joint, lower leg, left  Weakness  Difficulty in walking  Unsteadiness on feet      Subjective Assessment - 03/15/15 1419    Subjective "things are going alright, its like a tooth ache, I just deal with it now"   Currently in Pain? Yes   Pain Score 5    Pain Location Knee   Pain Orientation Left;Medial   Pain Descriptors / Indicators Aching;Burning;Throbbing   Pain Type Chronic pain   Pain Onset More than a month ago   Pain Frequency Intermittent            [facility] PT Assessment - 03/15/15 0001    AROM   Left  Knee Flexion 128                     [facility] Adult PT Treatment/Exercise - 03/15/15 1420    Lumbar Exercises: Aerobic   Stationary Bike Nustep L 6 x 5 min LE only   Knee/Hip Exercises: Stretches   Active Hamstring Stretch 2 reps;30 seconds   Active Hamstring Stretch Limitations with strap   Hip Flexor Stretch 2 reps;30 seconds   Knee/Hip Exercises: Supine   Quad Sets 10 reps   Short Arc Quad Sets AROM;Strengthening;2 sets;10 reps   Heel Slides AROM;Strengthening;Left;10 reps  performed in sitting   Bridges AROM;Strengthening;Both;2 sets;15 reps   Straight Leg Raises AROM;Strengthening;Left;1 set;10 reps   Other Supine Knee Exercises ball squeeze x 10  5 sec hold   Other Supine Knee Exercises sit to stand x 10 from table  demonsrated leaning to the R after 7 reps   Knee/Hip Exercises: Sidelying   Clams 2 x 15   with no resistance   Cryotherapy   Number Minutes Cryotherapy 15 Minutes   Cryotherapy Location Knee   Type of Cryotherapy Ice pack   Manual Therapy   Manual Therapy Joint mobilization  Joint Mobilization grade 2-3 patellar mobilization                 PT Education - 03/15/15 1454    Education provided No          PT Short Term Goals - 02/23/15 1710    PT SHORT TERM GOAL #1   Title Pt will report </= 3/10 L knee pain with standing x 30 min for rec activities   Baseline up to 6/10 L knee   Time 2   Period Weeks   Status New   PT SHORT TERM GOAL #2   Title Pt will be able to amb x 30 min with 50% less difficulty with L LE   Baseline 15 min   Time 2   Period Weeks   Status New   PT SHORT TERM GOAL #3   Title pt will be able to amb up/down curbs without feelings of knee buckling x 50% of the time   Baseline unable   Time 2   Period Weeks   Status New           PT Long Term Goals - 02/23/15 1712    PT LONG TERM GOAL #1   Title pt will report 75% improvement in all WB activities of L knee with pain   Baseline unable   Time 6    Period Weeks   Status New   PT LONG TERM GOAL #2   Title pt will be I with advanced HEP for knee pain and strengthening to assist with improved mobility   Baseline unmet   Time 6   Period Weeks   Status New   PT LONG TERM GOAL #3   Title pt will demo improved L LE strength 5/5 to assist with RTW   Baseline unmet   Time 6   Period Weeks   Status New               Plan - 03/15/15 1455    Clinical Impression Statement [name] tolerated treatment well today with report of weakness during sidelying clam exercises and sit to stands demonstrating shifting to the R to take pressure of the L leg. performed lateral mcconnel tape job on the L knee which he reported  significant  relief plan to progress with strengthening as tolerated.    PT Next Visit Plan hip adductor stretching, patella mobs, gentle LE strengthening, TENS/ PT HAS REFERRAL FOR VESTIBUAR REHAB-trying to get approval to come here   Consulted and Agree with Plan of Care Patient        Problem List Patient Active Problem List   Diagnosis Date Noted  . Left leg pain 02/27/2015  . Encounter for chronic pain management 01/30/2015  . Rib pain on left side 01/27/2015  . Injury resulting from fall from height 12/09/2014  . Adjustment disorder with anxiety 12/09/2014  . Hematoma 11/17/2014  . Traumatic ecchymosis of lower back 11/04/2014  . Traumatic ecchymosis of left ankle 11/04/2014  . Broken rib 10/28/2014  . Splinter of right foot 07/05/2014  . Pain in right hip 12/10/2012  . Erectile dysfunction 10/21/2012  . HTN (hypertension), benign 04/14/2012  . DM (diabetes mellitus), type 2, uncontrolled 04/13/2012   Starr Lake PT, DPT, LAT, ATC  03/15/2015  3:01 PM    [facility] [address] [place], [place], [address] Phone: [phone]   Fax:  [phone]

## 2015-03-20 ENCOUNTER — Ambulatory Visit: Payer: Worker's Compensation | Admitting: Physical Therapy

## 2015-03-20 DIAGNOSIS — M25562 Pain in left knee: Secondary | ICD-10-CM | POA: Diagnosis not present

## 2015-03-20 DIAGNOSIS — R2681 Unsteadiness on feet: Secondary | ICD-10-CM

## 2015-03-20 DIAGNOSIS — R531 Weakness: Secondary | ICD-10-CM

## 2015-03-20 DIAGNOSIS — R262 Difficulty in walking, not elsewhere classified: Secondary | ICD-10-CM

## 2015-03-20 NOTE — Therapy (Signed)
[place], [place], [address] Phone: [phone]   Fax:  [phone]  Physical Therapy Treatment  Patient Details  Name: [name] MRN: [id number] Date of Birth: [birth year] Referring Provider:  Ma Hillock, DO  Encounter Date: 03/20/2015      PT End of Session - 03/20/15 1451    Visit Number 4   Number of Visits 12   Date for PT Re-Evaluation 03/23/15   Authorization Type Workers Comp   Authorization - Visit Number 4   Authorization - Number of Visits 12   PT Start Time 1415   PT Stop Time 1505   PT Time Calculation (min) 50 min   Activity Tolerance Patient tolerated treatment well   Behavior During Therapy [facility] for tasks assessed/performed      Past Medical History  Diagnosis Date  . Hypertension   . Diabetes mellitus     Type 2 DM  x 24 years  . Foreign body in left foot     with  infection  . Multiple rib fractures   . Allergy     Past Surgical History  Procedure Laterality Date  . No past surgeries    . I&d extremity  04/23/2012    Procedure: IRRIGATION AND DEBRIDEMENT EXTREMITY;  Surgeon: Newt Minion, MD;  Location: [place];  Service: Orthopedics;  Laterality: Left;  Irrigation and Debridement Left Foot  . I&d extremity  09/18/2012    Procedure: IRRIGATION AND DEBRIDEMENT EXTREMITY;  Surgeon: Newt Minion, MD;  Location: [place];  Service: Orthopedics;  Laterality: Left;  [name] Eye surgery      There were no vitals filed for this visit.  Visit Diagnosis:  Pain in joint, lower leg, left  Weakness  Difficulty in walking  Unsteadiness on feet      Subjective Assessment - 03/20/15 1420    Subjective "I was a little sore since the last visit" He reported that he liked the mcconnell tape job.   Currently in Pain? Yes   Pain Score 5    Pain Location Knee   Pain Orientation Left;Medial   Pain Descriptors / Indicators Aching;Burning;Throbbing   Pain Type Chronic pain   Aggravating Factors  walking/ standing                         [facility] Adult PT Treatment/Exercise - 03/20/15 0001    Lumbar Exercises: Aerobic   Stationary Bike Nustep L 4 x 6 min LE only   Knee/Hip Exercises: Supine   Quad Sets 10 reps   Short Arc Quad Sets AROM;Strengthening;2 sets;10 reps   Heel Slides AROM;Strengthening;Left;10 reps   Bridges AROM;Strengthening;Both;2 sets;15 reps   Straight Leg Raises AROM;Strengthening;Left;1 set;10 reps   Other Supine Knee Exercises ball squeeze x 10   Other Supine Knee Exercises sit to stand x 10 from table   Modalities   Modalities Moist Heat;Electrical Stimulation   Moist Heat Therapy   Number Minutes Moist Heat 10 Minutes   Moist Heat Location Knee   Electrical Stimulation   Electrical Stimulation Location L knee   Electrical Stimulation Action IFC   Electrical Stimulation Parameters 100% scan, sweep, 10 min to tolerance   Electrical Stimulation Goals Pain   Manual Therapy   Manual Therapy Joint mobilization;Taping   Joint Mobilization grade 2-3 patellar mobilization    McConnell Left knee patella lateral mcconnell tape  PT Education - 03/20/15 1451    Education provided Yes   Education Details e-stim education, mcconnell tape education   Person(s) Educated Patient   Methods Explanation   Comprehension Verbalized understanding          PT Short Term Goals - 03/20/15 1455    PT SHORT TERM GOAL #1   Title Pt will report </= 3/10 L knee pain with standing x 30 min for rec activities   Baseline up to 6/10 L knee   Time 2   Period Weeks   Status On-going   PT SHORT TERM GOAL #2   Title Pt will be able to amb x 30 min with 50% less difficulty with L LE   Baseline 15 min   Time 2   Period Weeks   Status On-going   PT SHORT TERM GOAL #3   Title pt will be able to amb up/down curbs without feelings of knee buckling x 50% of the time   Baseline unable   Time 2   Period Weeks   Status  On-going           PT Long Term Goals - 03/20/15 1456    PT LONG TERM GOAL #1   Title pt will report 75% improvement in all WB activities of L knee with pain   Baseline unable   Time 6   Period Weeks   Status On-going   PT LONG TERM GOAL #2   Title pt will be I with advanced HEP for knee pain and strengthening to assist with improved mobility   Baseline unmet   Time 6   Period Weeks   Status On-going   PT LONG TERM GOAL #3   Title pt will demo improved L LE strength 5/5 to assist with RTW   Baseline unmet   Time 6   Period Weeks   Status On-going               Plan - 03/20/15 [place] presents to therapy today with report tha the is feeling more sore since the last visit. he tolerated treatment well today and reported pain relief following treatment in combination with the mcconnell lateral patellar tape job. plan to progress with strengthening as tolerated. He reported tha the is going to be seen by [facility] for his [facility] re[facility] and was told that he had no choice in where he can go.    PT Next Visit Plan hip adductor stretching, patella mobs, gentle LE strengthening, RE-evaluation, FOTO, goals        Problem List Patient Active Problem List   Diagnosis Date Noted  . Left leg pain 02/27/2015  . Encounter for chronic pain management 01/30/2015  . Rib pain on left side 01/27/2015  . Injury resulting from fall from height 12/09/2014  . Adjustment disorder with anxiety 12/09/2014  . Hematoma 11/17/2014  . Traumatic ecchymosis of lower back 11/04/2014  . Traumatic ecchymosis of left ankle 11/04/2014  . Broken rib 10/28/2014  . Splinter of right foot 07/05/2014  . Pain in right hip 12/10/2012  . Erectile dysfunction 10/21/2012  . HTN (hypertension), benign 04/14/2012  . DM (diabetes mellitus), type 2, uncontrolled 04/13/2012   Starr Lake PT, DPT, LAT, ATC  03/20/2015  3:05 PM    [place] [facility] [address] [place], [place], [address] Phone: [phone]   Fax:  [phone]

## 2015-03-22 ENCOUNTER — Ambulatory Visit: Payer: Worker's Compensation | Admitting: Physical Therapy

## 2015-03-22 DIAGNOSIS — M25562 Pain in left knee: Secondary | ICD-10-CM | POA: Diagnosis not present

## 2015-03-22 DIAGNOSIS — R2681 Unsteadiness on feet: Secondary | ICD-10-CM

## 2015-03-22 DIAGNOSIS — R262 Difficulty in walking, not elsewhere classified: Secondary | ICD-10-CM

## 2015-03-22 DIAGNOSIS — R531 Weakness: Secondary | ICD-10-CM

## 2015-03-22 NOTE — Therapy (Signed)
[place], [place], [address] Phone: [phone]   Fax:  [phone]  Physical Therapy Treatment  Patient Details  Name: [name] MRN: [id number] Date of Birth: June 26, 1966 Referring Provider:  Ma Hillock, DO  Encounter Date: 03/22/2015      PT End of Session - 03/22/15 1424    Visit Number 5   Number of Visits 12   Date for PT Re-Evaluation 03/23/15   PT Start Time 0217   PT Stop Time 0313   PT Time Calculation (min) 56 min      Past Medical History  Diagnosis Date  . Hypertension   . Diabetes mellitus     Type 2 DM  x 24 years  . Foreign body in left foot     with  infection  . Multiple rib fractures   . Allergy     Past Surgical History  Procedure Laterality Date  . No past surgeries    . I&d extremity  04/23/2012    Procedure: IRRIGATION AND DEBRIDEMENT EXTREMITY;  Surgeon: Newt Minion, MD;  Location: [place];  Service: Orthopedics;  Laterality: Left;  Irrigation and Debridement Left Foot  . I&d extremity  09/18/2012    Procedure: IRRIGATION AND DEBRIDEMENT EXTREMITY;  Surgeon: Newt Minion, MD;  Location: [place];  Service: Orthopedics;  Laterality: Left;  [name] Eye surgery      There were no vitals filed for this visit.  Visit Diagnosis:  Pain in joint, lower leg, left  Weakness  Unsteadiness on feet  Difficulty in walking      Subjective Assessment - 03/22/15 1419    Subjective Its a little sore. I tried to strengthen my knees by climbing while holding weights. The tape makes it feel better but it tore my skin.    Currently in Pain? Yes   Pain Score 7   6 or 7    Pain Orientation Left;Medial                         [facility] Adult PT Treatment/Exercise - 03/22/15 1430    Lumbar Exercises: Aerobic   Stationary Bike Nustep L5 LE only x 6 min   Knee/Hip Exercises: Standing   Lateral Step Up Left;10 reps;Hand Hold: 1;Step Height: 4"   Forward Step Up  Left;10 reps;Hand Hold: 1;Step Height: 4"   Knee/Hip Exercises: Seated   Long Arc Quad 2 sets;10 reps   Long Arc Quad Weight 3 lbs.   Other Seated Knee Exercises knee flexion with green band x 15   Knee/Hip Exercises: Supine   Short Arc Quad Sets AROM;Strengthening;2 sets;10 reps   Short Arc Quad Sets Limitations 5#   Bridges 15 reps   Bridges Limitations with ball squeeze   Straight Leg Raises AROM;Strengthening;Left;1 set;10 reps   Straight Leg Raises Limitations 2#   Other Supine Knee Exercises sit to stand x 10 from table   Knee/Hip Exercises: Sidelying   Hip ABduction Strengthening;Both;10 reps   Cryotherapy   Number Minutes Cryotherapy 15 Minutes   Cryotherapy Location Knee   Type of Cryotherapy Ice pack   Knee/Hip Exercises: Machines for Strengthening   Cybex Leg Press 1 plate, 2 plates x 20 each bilateral then 1 plate left only with seat raised.                   PT Short Term Goals - 03/20/15 1455    PT SHORT TERM  GOAL #1   Title Pt will report </= 3/10 L knee pain with standing x 30 min for rec activities   Baseline up to 6/10 L knee   Time 2   Period Weeks   Status On-going   PT SHORT TERM GOAL #2   Title Pt will be able to amb x 30 min with 50% less difficulty with L LE   Baseline 15 min   Time 2   Period Weeks   Status On-going   PT SHORT TERM GOAL #3   Title pt will be able to amb up/down curbs without feelings of knee buckling x 50% of the time   Baseline unable   Time 2   Period Weeks   Status On-going           PT Long Term Goals - 03/20/15 1456    PT LONG TERM GOAL #1   Title pt will report 75% improvement in all WB activities of L knee with pain   Baseline unable   Time 6   Period Weeks   Status On-going   PT LONG TERM GOAL #2   Title pt will be I with advanced HEP for knee pain and strengthening to assist with improved mobility   Baseline unmet   Time 6   Period Weeks   Status On-going   PT LONG TERM GOAL #3   Title pt will  demo improved L LE strength 5/5 to assist with RTW   Baseline unmet   Time 6   Period Weeks   Status On-going               Plan - 03/22/15 1448    Clinical Impression Statement Pt presents today with concern about gaining strength prior to the end of PT visits. He reports attempting weighted step ups at home and felt bilateral hip pain as well as LOB. Educated pt on exercise progression and the goal of PT is to not increase his pain with strengthening. Encouraged pt to focus on his prescribed HEP. Pt progressed to Leg press with 1-2 plates and no increased pain. Also trial of 4 inch step ups with good tolerance. Added weights to Mat exercises with pt reporting knee pain at 6.5/10 prior to ice pack.    PT Next Visit Plan hip adductor stretching, patella mobs, gentle LE strengthening, RE-evaluation, FOTO, goals- no tape-sensiive to skin tears/irritation        Problem List Patient Active Problem List   Diagnosis Date Noted  . Left leg pain 02/27/2015  . Encounter for chronic pain management 01/30/2015  . Rib pain on left side 01/27/2015  . Injury resulting from fall from height 12/09/2014  . Adjustment disorder with anxiety 12/09/2014  . Hematoma 11/17/2014  . Traumatic ecchymosis of lower back 11/04/2014  . Traumatic ecchymosis of left ankle 11/04/2014  . Broken rib 10/28/2014  . Splinter of right foot 07/05/2014  . Pain in right hip 12/10/2012  . Erectile dysfunction 10/21/2012  . HTN (hypertension), benign 04/14/2012  . DM (diabetes mellitus), type 2, uncontrolled 04/13/2012    Dorene Ar, PTA 03/22/2015, 3:00 PM  [place], [place], [address] Phone: [phone]   Fax:  [phone]

## 2015-03-27 ENCOUNTER — Ambulatory Visit: Payer: Worker's Compensation | Admitting: Physical Therapy

## 2015-03-27 DIAGNOSIS — R531 Weakness: Secondary | ICD-10-CM

## 2015-03-27 DIAGNOSIS — R262 Difficulty in walking, not elsewhere classified: Secondary | ICD-10-CM

## 2015-03-27 DIAGNOSIS — M25562 Pain in left knee: Secondary | ICD-10-CM | POA: Diagnosis not present

## 2015-03-27 DIAGNOSIS — R2681 Unsteadiness on feet: Secondary | ICD-10-CM

## 2015-03-27 NOTE — Therapy (Signed)
[place], [place], [address] Phone: [phone]   Fax:  [phone]  Physical Therapy Treatment/ re-evaluation  Patient Details  Name: [name] MRN: [id number] Date of Birth: August 05, 1966 Referring Provider:  Ma Hillock, DO  Encounter Date: 03/27/2015      PT End of Session - 03/27/15 1433    Visit Number 6   Number of Visits 14   Date for PT Re-Evaluation 04/24/15   Authorization Type Workers Comp   Authorization - Visit Number 4   Authorization - Number of Visits 12   PT Start Time 1415      Past Medical History  Diagnosis Date  . Hypertension   . Diabetes mellitus     Type 2 DM  x 24 years  . Foreign body in left foot     with  infection  . Multiple rib fractures   . Allergy     Past Surgical History  Procedure Laterality Date  . No past surgeries    . I&d extremity  04/23/2012    Procedure: IRRIGATION AND DEBRIDEMENT EXTREMITY;  Surgeon: Newt Minion, MD;  Location: [place];  Service: Orthopedics;  Laterality: Left;  Irrigation and Debridement Left Foot  . I&d extremity  09/18/2012    Procedure: IRRIGATION AND DEBRIDEMENT EXTREMITY;  Surgeon: Newt Minion, MD;  Location: [place];  Service: Orthopedics;  Laterality: Left;  [name] Eye surgery      There were no vitals filed for this visit.  Visit Diagnosis:  Pain in joint, lower leg, left - Plan: PT plan of care cert/re-cert  Weakness - Plan: PT plan of care cert/re-cert  Unsteadiness on feet - Plan: PT plan of care cert/re-cert  Difficulty in walking - Plan: PT plan of care cert/re-cert      Subjective Assessment - 03/27/15 1426    Subjective "I was really sore in my quads the last visit, but am doing okay today"   Currently in Pain? Yes   Pain Score 4    Pain Location Knee   Pain Orientation Left;Medial   Pain Descriptors / Indicators Aching;Burning;Throbbing   Pain Type Chronic pain   Pain Onset More than a month ago   Pain Frequency Intermittent   Aggravating Factors  walking/ standing            [facility] PT Assessment - 03/27/15 1441    Assessment   Medical Diagnosis L LE contusion   Onset Date/Surgical Date 10/28/14   Next MD Visit 2 weeks   Prior Function   Level of Independence Independent   Cognition   Overall Cognitive Status Within Functional Limits for tasks assessed   Sensation   Light Touch Impaired Detail  limited over L5 dermatome in LLE   AROM   Left Knee Extension 0   Left Knee Flexion 123   Strength   Left Knee Flexion 4+/5  pain during testing   Left Knee Extension 4+/5  pain during testing                     [facility] Adult PT Treatment/Exercise - 03/27/15 1427    Lumbar Exercises: Aerobic   Stationary Bike Nustep L5 LE only x 8 min   Lumbar Exercises: Supine   Bridge 10 reps;2 seconds   Straight Leg Raise 10 reps   Knee/Hip Exercises: Stretches   Active Hamstring Stretch 2 reps;30 seconds   Active Hamstring Stretch Limitations with strap   Sports administrator  2 reps;30 seconds   Knee/Hip Exercises: Machines for Strengthening   Cybex Leg Press 1 plate, 2 plates x 20 each bilateral then 1 plate left only with seat raised.    Knee/Hip Exercises: Sidelying   Hip ABduction Strengthening;Both;10 reps;2 sets  1                PT Education - 03/27/15 1454    Education provided Yes   Education Details HEP review   Person(s) Educated Patient   Methods Explanation   Comprehension Verbalized understanding          PT Short Term Goals - 03/27/15 1458    PT SHORT TERM GOAL #1   Title Pt will report </= 3/10 L knee pain with standing x 30 min for rec activities   Baseline up to 6/10 L knee   Time 2   Period Weeks   Status On-going   PT SHORT TERM GOAL #2   Title Pt will be able to amb x 30 min with 50% less difficulty with L LE   Baseline 15 min   Time 2   Period Weeks   Status On-going   PT SHORT TERM GOAL #3   Title pt will be able to amb  up/down curbs without feelings of knee buckling x 50% of the time   Baseline unable   Time 2   Period Weeks   Status On-going           PT Long Term Goals - 03/27/15 1458    PT LONG TERM GOAL #1   Title pt will report 75% improvement in all WB activities of L knee with pain   Baseline unable   Time 6   Period Weeks   Status On-going   PT LONG TERM GOAL #2   Title pt will be I with advanced HEP for knee pain and strengthening to assist with improved mobility   Baseline unmet   Time 6   Period Weeks   Status On-going   PT LONG TERM GOAL #3   Title pt will demo improved L LE strength 5/5 to assist with RTW   Baseline unmet   Time 6   Period Weeks   Status On-going               Plan - 03/27/15 [place] continues to make progress with static strength and functional flexion. however continues to demonstrate pain during AROM and MMT assessment. He reports continued soreness following each session with intemrittent numbness and tingling into the L foot, palpation revealed increased tenderness in the popliteal fossa with referral of pain into the foot. He would benefit from continued physical therapy to progress with dynamic strength, increased AROM, Decreased pain and balance.    Pt will benefit from skilled therapeutic intervention in order to improve on the following deficits Decreased range of motion;Difficulty walking;Impaired flexibility;Improper body mechanics;Decreased endurance;Increased edema;Impaired sensation;Pain   PT Frequency 2x / week   PT Duration 4 weeks   PT Treatment/Interventions Moist Heat;Therapeutic activities;Patient/family education;Passive range of motion;Therapeutic exercise;Ultrasound;Gait training;Balance training;Manual techniques;Cryotherapy;Neuromuscular re-education;Electrical Stimulation   PT Next Visit Plan hip adductor stretching, patella mobs, gentle LE strengthening, RE-evaluation, FOTO, goals- no  tape-sensiive to skin tears/irritation   PT Home Exercise Plan HEP review   Consulted and Agree with Plan of Care Patient        Problem List Patient Active Problem List   Diagnosis Date Noted  . Left leg pain 02/27/2015  .  Encounter for chronic pain management 01/30/2015  . Rib pain on left side 01/27/2015  . Injury resulting from fall from height 12/09/2014  . Adjustment disorder with anxiety 12/09/2014  . Hematoma 11/17/2014  . Traumatic ecchymosis of lower back 11/04/2014  . Traumatic ecchymosis of left ankle 11/04/2014  . Broken rib 10/28/2014  . Splinter of right foot 07/05/2014  . Pain in right hip 12/10/2012  . Erectile dysfunction 10/21/2012  . HTN (hypertension), benign 04/14/2012  . DM (diabetes mellitus), type 2, uncontrolled 04/13/2012   Starr Lake PT, DPT, LAT, ATC  03/27/2015  4:13 PM      [place] [facility] [address] [place], [place], [address] Phone: [phone]   Fax:  [phone]

## 2015-03-29 ENCOUNTER — Ambulatory Visit: Payer: Worker's Compensation | Admitting: Physical Therapy

## 2015-03-29 DIAGNOSIS — M25562 Pain in left knee: Secondary | ICD-10-CM | POA: Diagnosis not present

## 2015-03-29 DIAGNOSIS — R2681 Unsteadiness on feet: Secondary | ICD-10-CM

## 2015-03-29 DIAGNOSIS — R531 Weakness: Secondary | ICD-10-CM

## 2015-03-29 DIAGNOSIS — R262 Difficulty in walking, not elsewhere classified: Secondary | ICD-10-CM

## 2015-03-29 NOTE — Therapy (Signed)
[place], [place], [address] Phone: [phone]   Fax:  [phone]  Physical Therapy Treatment  Patient Details  Name: [name] MRN: [id number] Date of Birth: [birth year] Referring Provider:  Ma Hillock, DO  Encounter Date: 03/29/2015      PT End of Session - 03/29/15 1551    Visit Number 7   Number of Visits 14   Date for PT Re-Evaluation 04/24/15   PT Start Time [id number]   PT Stop Time 1510   PT Time Calculation (min) 55 min   Activity Tolerance Patient tolerated treatment well   Behavior During Therapy [facility] for tasks assessed/performed      Past Medical History  Diagnosis Date  . Hypertension   . Diabetes mellitus     Type 2 DM  x 24 years  . Foreign body in left foot     with  infection  . Multiple rib fractures   . Allergy     Past Surgical History  Procedure Laterality Date  . No past surgeries    . I&d extremity  04/23/2012    Procedure: IRRIGATION AND DEBRIDEMENT EXTREMITY;  Surgeon: Newt Minion, MD;  Location: [place];  Service: Orthopedics;  Laterality: Left;  Irrigation and Debridement Left Foot  . I&d extremity  09/18/2012    Procedure: IRRIGATION AND DEBRIDEMENT EXTREMITY;  Surgeon: Newt Minion, MD;  Location: [place];  Service: Orthopedics;  Laterality: Left;  [name] Eye surgery      There were no vitals filed for this visit.  Visit Diagnosis:  Pain in joint, lower leg, left  Weakness  Unsteadiness on feet  Difficulty in walking      Subjective Assessment - 03/29/15 1422    Subjective "my knee is doing ok since the last visit, I am about a 3-4 today" pt reports not going to therapy yesterday for his dizziness since he was on edge and had to go pick up his mom from the airprot in [place]   Currently in Pain? Yes   Pain Score 4    Pain Location Knee   Pain Orientation Left;Medial   Pain Descriptors / Indicators Aching;Burning;Throbbing   Pain Type Chronic pain   Pain Onset More than a month ago   Pain Frequency Intermittent                         [facility] Adult PT Treatment/Exercise - 03/29/15 0001    Balance   Balance Assessed Yes   Static Standing Balance   Single Leg Stance - Right Leg --  unable to perform   Single Leg Stance - Left Leg --  unable to perform   Tandem Stance - Right Leg 15  with significant postural sway   Tandem Stance - Left Leg 12  with significant postural sway   Rhomberg - Eyes Opened 30  mild postural sway   Rhomberg - Eyes Closed 12  with severe postural sway   Lumbar Exercises: Aerobic   Stationary Bike Bike L2 x 6 min   Lumbar Exercises: Supine   Bridge 10 reps;2 seconds  x 2 sets   Straight Leg Raise 10 reps  x 2 sets   Knee/Hip Exercises: Stretches   Active Hamstring Stretch 2 reps;30 seconds   Active Hamstring Stretch Limitations with strap   Quad Stretch 30 seconds;4 reps  performed in prone   Knee/Hip Exercises: Machines for Strengthening   Cybex Knee Extension  1 plate, x 6  noted pain and increased fatigue   Cybex Knee Flexion 2 plates x 6   Cybex Leg Press 1 plate, 2 plates x 20 each bilateral then 1 plate left only with seat raised.    Knee/Hip Exercises: Standing   Heel Raises 2 sets;10 reps   Lateral Step Up Left;10 reps;Hand Hold: 1;Step Height: 4"   Forward Step Up Left;10 reps;Hand Hold: 1;Step Height: 4"   Step Down 5 reps;Left;Step Height: 4"  soreness noted following exercise   Knee/Hip Exercises: Supine   Bridges 15 reps   Bridges Limitations with ball squeeze   Straight Leg Raises AROM;Strengthening;Left;1 set;10 reps   Straight Leg Raises Limitations 2#   Other Supine Knee/Hip Exercises sit to stand x 10 from table   Manual Therapy   Joint Mobilization grade 2-3 patellar mobilization    McConnell Left knee patella lateral mcconnell tape                PT Education - 03/29/15 1551    Education provided Yes   Education Details balance training  education   Person(s) Educated Patient   Methods Explanation   Comprehension Verbalized understanding          PT Short Term Goals - 03/27/15 1458    PT SHORT TERM GOAL #1   Title Pt will report </= 3/10 L knee pain with standing x 30 min for rec activities   Baseline up to 6/10 L knee   Time 2   Period Weeks   Status On-going   PT SHORT TERM GOAL #2   Title Pt will be able to amb x 30 min with 50% less difficulty with L LE   Baseline 15 min   Time 2   Period Weeks   Status On-going   PT SHORT TERM GOAL #3   Title pt will be able to amb up/down curbs without feelings of knee buckling x 50% of the time   Baseline unable   Time 2   Period Weeks   Status On-going           PT Long Term Goals - 03/27/15 1458    PT LONG TERM GOAL #1   Title pt will report 75% improvement in all WB activities of L knee with pain   Baseline unable   Time 6   Period Weeks   Status On-going   PT LONG TERM GOAL #2   Title pt will be I with advanced HEP for knee pain and strengthening to assist with improved mobility   Baseline unmet   Time 6   Period Weeks   Status On-going   PT LONG TERM GOAL #3   Title pt will demo improved L LE strength 5/5 to assist with RTW   Baseline unmet   Time 6   Period Weeks   Status On-going               Plan - 03/29/15 1552    Clinical Impression Statement Gaddiel presents to therapy today with report of 4/10 pain in the Left knee. He requested to get the mcconnell tape for pain relief even though he reported the tape pulled his skin a little last. Educated regarding balance training and how ti will be incorporated into treatment. He performed all exercises well with mild report of pain and increased  fatigue. During balance assessment he demonstrates significant posturals sway with tandem stance and mod postural sway during rhomberg with eyes open.  PT Next Visit Plan hip adductor stretching, patella mobs, gentle LE strengthening, knee  strengthening.         Problem List Patient Active Problem List   Diagnosis Date Noted  . Left leg pain 02/27/2015  . Encounter for chronic pain management 01/30/2015  . Rib pain on left side 01/27/2015  . Injury resulting from fall from height 12/09/2014  . Adjustment disorder with anxiety 12/09/2014  . Hematoma 11/17/2014  . Traumatic ecchymosis of lower back 11/04/2014  . Traumatic ecchymosis of left ankle 11/04/2014  . Broken rib 10/28/2014  . Splinter of right foot 07/05/2014  . Pain in right hip 12/10/2012  . Erectile dysfunction 10/21/2012  . HTN (hypertension), benign 04/14/2012  . DM (diabetes mellitus), type 2, uncontrolled 04/13/2012   Starr Lake PT, DPT, LAT, ATC  03/29/2015  4:03 PM    [facility] [address] [place], [place], [address] Phone: [phone]   Fax:  [phone]

## 2015-04-03 ENCOUNTER — Ambulatory Visit: Payer: Worker's Compensation | Admitting: Physical Therapy

## 2015-04-03 DIAGNOSIS — R2681 Unsteadiness on feet: Secondary | ICD-10-CM

## 2015-04-03 DIAGNOSIS — M25562 Pain in left knee: Secondary | ICD-10-CM | POA: Diagnosis not present

## 2015-04-03 DIAGNOSIS — R531 Weakness: Secondary | ICD-10-CM

## 2015-04-03 DIAGNOSIS — R262 Difficulty in walking, not elsewhere classified: Secondary | ICD-10-CM

## 2015-04-03 NOTE — Patient Instructions (Signed)
Remove tape if irritating, mat shower and wear up to 5 days,

## 2015-04-04 NOTE — Therapy (Addendum)
[place], [place], [address] Phone: [phone]   Fax:  [phone]  Physical Therapy Treatment  Patient Details  Name: [name] MRN: [id number] Date of Birth: Jan 28, 1966 Referring Provider:  Ma Hillock, DO  Encounter Date: 04/03/2015  Visit: 8 Number of visits 14 Date of re-evaluation: 7/18-2016      PT End of Session - 04/04/15 0749    PT Start Time 1420   PT Stop Time 1505   PT Time Calculation (min) 45 min   Activity Tolerance Patient tolerated treatment well   Behavior During Therapy [facility] for tasks assessed/performed      Past Medical History  Diagnosis Date  . Hypertension   . Diabetes mellitus     Type 2 DM  x 24 years  . Foreign body in left foot     with  infection  . Multiple rib fractures   . Allergy     Past Surgical History  Procedure Laterality Date  . No past surgeries    . I&d extremity  04/23/2012    Procedure: IRRIGATION AND DEBRIDEMENT EXTREMITY;  Surgeon: Newt Minion, MD;  Location: [place];  Service: Orthopedics;  Laterality: Left;  Irrigation and Debridement Left Foot  . I&d extremity  09/18/2012    Procedure: IRRIGATION AND DEBRIDEMENT EXTREMITY;  Surgeon: Newt Minion, MD;  Location: [place];  Service: Orthopedics;  Laterality: Left;  [name] Eye surgery      There were no vitals filed for this visit.  Visit Diagnosis:  Pain in joint, lower leg, left  Weakness  Unsteadiness on feet  Difficulty in walking      Subjective Assessment - 04/03/15 1425    Subjective Has Vestibular re-Hab too.  4/10  [place] is changing.  Using less medication.  Exercises make numbness worse. (Moving around)   Pain Score 4    Pain Location Knee   Pain Orientation Medial;Left   Pain Descriptors / Indicators Aching;Sore;Throbbing;Burning;Numbness  toothack constant   Pain Frequency Constant   Aggravating Factors  walking stading sometimes pain at night if he has to get up   Pain Relieving Factors rubbing, elevation ice heat medication   Multiple Pain Sites Yes  Lt broken rib                         [facility] Adult PT Treatment/Exercise - 04/03/15 1431    Knee/Hip Exercises: Stretches   Piriformis Stretch Limitations step  rocker board, and with strap   Knee/Hip Exercises: Standing   Terminal Knee Extension 10 reps   Theraband Level (Terminal Knee Extension) Level 2 (Red)   Terminal Knee Extension Limitations shakey reported foot  numb after rest only in little 2 toes.     Lateral Step Up 10 reps  6 inches,  uses arms   Functional Squat 10 reps  cues   Rocker Board --  1X stopped wobbly   SLS with Vectors on Lt moving Rt on pillow case 5 reps 3 directions.   Manual Therapy   Manual Therapy --  2 Y's 1 fan quad acitvation, leg inhibition, fan for bruise                  PT Short Term Goals - 03/27/15 1458    PT SHORT TERM GOAL #1   Title Pt will report </= 3/10 L knee pain with standing x 30 min for rec activities   Baseline up to  6/10 L knee   Time 2   Period Weeks   Status On-going   PT SHORT TERM GOAL #2   Title Pt will be able to amb x 30 min with 50% less difficulty with L LE   Baseline 15 min   Time 2   Period Weeks   Status On-going   PT SHORT TERM GOAL #3   Title pt will be able to amb up/down curbs without feelings of knee buckling x 50% of the time   Baseline unable   Time 2   Period Weeks   Status On-going           PT Long Term Goals - 03/27/15 1458    PT LONG TERM GOAL #1   Title pt will report 75% improvement in all WB activities of L knee with pain   Baseline unable   Time 6   Period Weeks   Status On-going   PT LONG TERM GOAL #2   Title pt will be I with advanced HEP for knee pain and strengthening to assist with improved mobility   Baseline unmet   Time 6   Period Weeks   Status On-going   PT LONG TERM GOAL #3   Title pt will demo improved L LE strength 5/5 to assist with RTW    Baseline unmet   Time 6   Period Weeks   Status On-going               Plan - 04/04/15 0750    Clinical Impression Statement Pain continues medial knee.  Very weak in extension.  Terminal knee extension with knee control was focus today. No new goals met.  Vertigo limits some exercises and he is at risk for falling.   PT Next Visit Plan assess tape, terminal knee strengthening.  stretching for home   Consulted and Agree with Plan of Care Patient        Problem List Patient Active Problem List   Diagnosis Date Noted  . Left leg pain 02/27/2015  . Encounter for chronic pain management 01/30/2015  . Rib pain on left side 01/27/2015  . Injury resulting from fall from height 12/09/2014  . Adjustment disorder with anxiety 12/09/2014  . Hematoma 11/17/2014  . Traumatic ecchymosis of lower back 11/04/2014  . Traumatic ecchymosis of left ankle 11/04/2014  . Broken rib 10/28/2014  . Splinter of right foot 07/05/2014  . Pain in right hip 12/10/2012  . Erectile dysfunction 10/21/2012  . HTN (hypertension), benign 04/14/2012  . DM (diabetes mellitus), type 2, uncontrolled 04/13/2012    HARRIS,KAREN 04/04/2015, 7:56 AM  [facility] [address] [place], [place], [address] Phone: [phone]   Fax:  [phone]     Melvenia Needles, PTA 04/04/2015 7:56 AM Phone: [phone] Fax: [phone]    PHYSICAL THERAPY DISCHARGE SUMMARY  Visits from Start of Care: 6  Current functional level related to goals / functional outcomes: See goals   Remaining deficits: Limited strength, balance and strength in the L knee   Education / Equipment: HEP  Plan: Patient agrees to discharge.  Patient goals were not met. Patient is being discharged due to not returning since the last visit.  ?????       Kristoffer Leamon PT, DPT, LAT, ATC  08/17/2015  1:31 PM

## 2015-04-05 ENCOUNTER — Ambulatory Visit: Payer: Worker's Compensation | Admitting: Physical Therapy

## 2015-04-17 ENCOUNTER — Ambulatory Visit: Payer: Self-pay | Admitting: Physical Therapy

## 2015-04-19 ENCOUNTER — Ambulatory Visit: Payer: Self-pay | Admitting: Physical Therapy

## 2015-04-24 ENCOUNTER — Ambulatory Visit: Payer: Self-pay | Admitting: Physical Therapy

## 2015-04-26 ENCOUNTER — Encounter: Payer: Self-pay | Admitting: Physical Therapy

## 2015-04-28 ENCOUNTER — Other Ambulatory Visit: Payer: Self-pay | Admitting: Surgery

## 2015-04-28 ENCOUNTER — Ambulatory Visit
Admission: RE | Admit: 2015-04-28 | Discharge: 2015-04-28 | Disposition: A | Discharge status: Home / Self Care | Payer: Self-pay | Source: Ambulatory Visit | Attending: Surgery | Admitting: Surgery

## 2015-04-28 DIAGNOSIS — R609 Edema, unspecified: Secondary | ICD-10-CM

## 2015-04-28 DIAGNOSIS — R52 Pain, unspecified: Secondary | ICD-10-CM

## 2015-05-01 ENCOUNTER — Encounter: Payer: Self-pay | Admitting: Physical Therapy

## 2015-05-01 ENCOUNTER — Other Ambulatory Visit: Payer: Self-pay

## 2015-05-03 ENCOUNTER — Other Ambulatory Visit: Payer: Self-pay | Admitting: Orthopaedic Surgery

## 2015-05-03 ENCOUNTER — Encounter: Payer: Self-pay | Admitting: Physical Therapy

## 2015-05-03 DIAGNOSIS — M25562 Pain in left knee: Secondary | ICD-10-CM

## 2015-05-08 ENCOUNTER — Encounter: Payer: Self-pay | Admitting: Physical Therapy

## 2015-05-10 ENCOUNTER — Ambulatory Visit
Admission: RE | Admit: 2015-05-10 | Discharge: 2015-05-10 | Disposition: A | Discharge status: Home / Self Care | Payer: Worker's Compensation | Source: Ambulatory Visit | Attending: Orthopaedic Surgery | Admitting: Orthopaedic Surgery

## 2015-05-10 ENCOUNTER — Encounter: Payer: Self-pay | Admitting: Physical Therapy

## 2015-05-10 DIAGNOSIS — M25562 Pain in left knee: Secondary | ICD-10-CM

## 2015-06-29 ENCOUNTER — Emergency Department (HOSPITAL_COMMUNITY)
Admission: EM | Admit: 2015-06-29 | Discharge: 2015-06-29 | Disposition: A | Discharge status: Home / Self Care | Payer: Self-pay | Attending: Emergency Medicine | Admitting: Emergency Medicine

## 2015-06-29 ENCOUNTER — Encounter (HOSPITAL_COMMUNITY): Payer: Self-pay | Admitting: Emergency Medicine

## 2015-06-29 ENCOUNTER — Emergency Department (HOSPITAL_COMMUNITY): Payer: Self-pay

## 2015-06-29 ENCOUNTER — Encounter: Payer: Self-pay | Admitting: Family Medicine

## 2015-06-29 ENCOUNTER — Ambulatory Visit (INDEPENDENT_AMBULATORY_CARE_PROVIDER_SITE_OTHER): Payer: Self-pay | Admitting: Family Medicine

## 2015-06-29 VITALS — BP 103/65 | HR 112 | Temp 101.7°F | Wt 195.0 lb

## 2015-06-29 DIAGNOSIS — Z79899 Other long term (current) drug therapy: Secondary | ICD-10-CM | POA: Insufficient documentation

## 2015-06-29 DIAGNOSIS — N12 Tubulo-interstitial nephritis, not specified as acute or chronic: Secondary | ICD-10-CM

## 2015-06-29 DIAGNOSIS — I10 Essential (primary) hypertension: Secondary | ICD-10-CM | POA: Insufficient documentation

## 2015-06-29 DIAGNOSIS — K8020 Calculus of gallbladder without cholecystitis without obstruction: Secondary | ICD-10-CM

## 2015-06-29 DIAGNOSIS — E119 Type 2 diabetes mellitus without complications: Secondary | ICD-10-CM | POA: Insufficient documentation

## 2015-06-29 DIAGNOSIS — R509 Fever, unspecified: Secondary | ICD-10-CM

## 2015-06-29 DIAGNOSIS — Z87891 Personal history of nicotine dependence: Secondary | ICD-10-CM | POA: Insufficient documentation

## 2015-06-29 DIAGNOSIS — Z794 Long term (current) use of insulin: Secondary | ICD-10-CM | POA: Insufficient documentation

## 2015-06-29 DIAGNOSIS — Z87828 Personal history of other (healed) physical injury and trauma: Secondary | ICD-10-CM | POA: Insufficient documentation

## 2015-06-29 DIAGNOSIS — Z91040 Latex allergy status: Secondary | ICD-10-CM | POA: Insufficient documentation

## 2015-06-29 HISTORY — DX: Unspecified tear of unspecified meniscus, current injury, unspecified knee, initial encounter: S83.209A

## 2015-06-29 LAB — URINALYSIS W MICROSCOPIC (NOT AT ARMC)
BILIRUBIN URINE: NEGATIVE
KETONES UR: NEGATIVE mg/dL
LEUKOCYTES UA: NEGATIVE
NITRITE: NEGATIVE
Specific Gravity, Urine: 1.021 (ref 1.005–1.030)
Urobilinogen, UA: 0.2 mg/dL (ref 0.0–1.0)
pH: 5.5 (ref 5.0–8.0)

## 2015-06-29 LAB — CBC WITH DIFFERENTIAL/PLATELET
BASOS PCT: 0 %
Basophils Absolute: 0.1 10*3/uL (ref 0.0–0.1)
EOS PCT: 9 %
Eosinophils Absolute: 1.2 10*3/uL — ABNORMAL HIGH (ref 0.0–0.7)
HEMATOCRIT: 38.8 % — AB (ref 39.0–52.0)
Hemoglobin: 14.1 g/dL (ref 13.0–17.0)
Lymphocytes Relative: 7 %
Lymphs Abs: 0.9 10*3/uL (ref 0.7–4.0)
MCH: 32.1 pg (ref 26.0–34.0)
MCHC: 36.3 g/dL — AB (ref 30.0–36.0)
MCV: 88.4 fL (ref 78.0–100.0)
MONOS PCT: 6 %
Monocytes Absolute: 0.8 10*3/uL (ref 0.1–1.0)
NEUTROS ABS: 10.8 10*3/uL — AB (ref 1.7–7.7)
Neutrophils Relative %: 78 %
PLATELETS: 170 10*3/uL (ref 150–400)
RBC: 4.39 MIL/uL (ref 4.22–5.81)
RDW: 12.2 % (ref 11.5–15.5)
WBC: 13.7 10*3/uL — ABNORMAL HIGH (ref 4.0–10.5)

## 2015-06-29 LAB — COMPREHENSIVE METABOLIC PANEL
ALT: 21 U/L (ref 17–63)
AST: 16 U/L (ref 15–41)
Albumin: 3.1 g/dL — ABNORMAL LOW (ref 3.5–5.0)
Alkaline Phosphatase: 98 U/L (ref 38–126)
Anion gap: 10 (ref 5–15)
BILIRUBIN TOTAL: 0.8 mg/dL (ref 0.3–1.2)
BUN: 18 mg/dL (ref 6–20)
CHLORIDE: 99 mmol/L — AB (ref 101–111)
CO2: 25 mmol/L (ref 22–32)
Calcium: 9 mg/dL (ref 8.9–10.3)
Creatinine, Ser: 1.33 mg/dL — ABNORMAL HIGH (ref 0.61–1.24)
GFR calc Af Amer: >60 mL/min (ref >60)
Glucose, Bld: 313 mg/dL — ABNORMAL HIGH (ref 65–99)
POTASSIUM: 3.8 mmol/L (ref 3.5–5.1)
Sodium: 134 mmol/L — ABNORMAL LOW (ref 135–145)
TOTAL PROTEIN: 6.9 g/dL (ref 6.5–8.1)

## 2015-06-29 LAB — I-STAT CG4 LACTIC ACID, ED
LACTIC ACID, VENOUS: 0.86 mmol/L (ref 0.5–2.0)
Lactic Acid, Venous: 1.78 mmol/L (ref 0.5–2.0)

## 2015-06-29 LAB — LIPASE, BLOOD: LIPASE: 23 U/L (ref 22–51)

## 2015-06-29 MED ORDER — INSULIN GLARGINE 100 UNIT/ML ~~LOC~~ SOLN: 26.0000 [IU] | Freq: Every day | SUBCUTANEOUS | Status: DC

## 2015-06-29 MED ORDER — HYDROMORPHONE HCL 1 MG/ML IJ SOLN
0.5000 mg | Freq: Once | INTRAMUSCULAR | Status: AC
  Administered 2015-06-29: 0.5 mg via INTRAVENOUS
  Filled 2015-06-29: qty 1

## 2015-06-29 MED ORDER — CEPHALEXIN 500 MG PO CAPS: 500.0000 mg | ORAL_CAPSULE | Freq: Three times a day (TID) | ORAL | Status: DC

## 2015-06-29 MED ORDER — ACETAMINOPHEN 325 MG PO TABS
650.0000 mg | ORAL_TABLET | Freq: Once | ORAL | Status: AC
  Administered 2015-06-29: 650 mg via ORAL
  Filled 2015-06-29: qty 2

## 2015-06-29 MED ORDER — IOHEXOL 300 MG/ML  SOLN
75.0000 mL | Freq: Once | INTRAMUSCULAR | Status: AC | PRN
  Administered 2015-06-29: 75 mL via INTRAVENOUS

## 2015-06-29 MED ORDER — ONDANSETRON HCL 4 MG/2ML IJ SOLN
4.0000 mg | Freq: Once | INTRAMUSCULAR | Status: AC
  Administered 2015-06-29: 4 mg via INTRAVENOUS
  Filled 2015-06-29: qty 2

## 2015-06-29 MED ORDER — SODIUM CHLORIDE 0.9 % IV BOLUS (SEPSIS)
1000.0000 mL | Freq: Once | INTRAVENOUS | Status: AC
  Administered 2015-06-29: 1000 mL via INTRAVENOUS

## 2015-06-29 MED ORDER — DEXTROSE 5 % IV SOLN
1.0000 g | Freq: Once | INTRAVENOUS | Status: AC
  Administered 2015-06-29: 1 g via INTRAVENOUS
  Filled 2015-06-29: qty 10

## 2015-06-29 MED ORDER — ONDANSETRON HCL 4 MG PO TABS
4.0000 mg | ORAL_TABLET | Freq: Three times a day (TID) | ORAL | Status: DC | PRN
Start: 2015-06-29 — End: 2016-05-22

## 2015-06-29 MED ORDER — IOHEXOL 300 MG/ML  SOLN: 25.0000 mL | Freq: Once | INTRAMUSCULAR | Status: DC | PRN

## 2015-06-29 MED ORDER — ACETAMINOPHEN 500 MG PO TABS
1000.0000 mg | ORAL_TABLET | Freq: Once | ORAL | Status: AC
  Administered 2015-06-29: 1000 mg via ORAL
  Filled 2015-06-29: qty 2

## 2015-06-29 MED ORDER — INSULIN ASPART 100 UNIT/ML ~~LOC~~ SOLN: 8.0000 [IU] | Freq: Three times a day (TID) | SUBCUTANEOUS | Status: DC

## 2015-06-29 MED ORDER — INSULIN DETEMIR 100 UNIT/ML ~~LOC~~ SOLN: 10.0000 [IU] | Freq: Two times a day (BID) | SUBCUTANEOUS | Status: DC

## 2015-06-29 NOTE — Patient Instructions (Signed)
Thank you for coming to see me today. It was a pleasure. Today we talked about:   Febrile illness: I am a little worried as I'm not sure where you could have an infection. I would like you to be evaluated in the emergency department for this.   If you have any questions or concerns, please do not hesitate to call the office at [phone].  Sincerely,  Cordelia Poche, MD

## 2015-06-29 NOTE — ED Notes (Addendum)
Patient states he has had cough, nausea, vomiting, fever (103.5 is highest, 101.7  today), chills, cold sweats, headaches x 1 month; denies diarrhea. Denies abdominal pain. Patient states he began to feel lightheaded x2 days. Patient does not appear short of breath; NAD.

## 2015-06-29 NOTE — Care Management Note (Signed)
Case Management Note  Patient Details  Name: [name] MRN: [id number] Date of Birth: 1966-03-27  Subjective/Objective:                  Patient states he has had cough, nausea, vomiting, fever (103.5 is highest, 101.7 today), chills, cold sweats, headaches x 1 month; denies diarrhea. Denies abdominal pain. Patient states he began to feel lightheaded x2 days.//Home with spouse.  Action/Plan:  Send to ED for further evaluation, including UA, labs and CXR//Follow for diposition needs  Expected Discharge Date:       06/29/15           Expected Discharge Plan:  Home/Self Care  In-House Referral:  PCP / [facility]  Discharge planning Services  CM Consult, Follow-up appt scheduled  Post Acute Care Choice:  NA Choice offered to:  NA  DME Arranged:  N/A DME Agency:  NA  HH Arranged:  NA HH Agency:  NA  Status of Service:  Completed, signed off  Medicare Important Message Given:    Date Medicare IM Given:    Medicare IM give by:    Date Additional Medicare IM Given:    Additional Medicare Important Message give by:     If discussed at [place] of Stay Meetings, dates discussed:    Additional Comments: Camellia J. Clydene Laming, RN, BSN, [place] [phone] ED CM consulted regarding PCP establishment and insurance enrollment. Pt presented to [facility] ED today with cough, nausea, vomiting. NCM met with pt at bedside; pt confirms not having access to f/u care with PCP or insurance coverage. Discussed with patient importance and benefits of establishing PCP, and not utilizing the ED for primary care needs. Pt verbalized understanding and is in agreement. Discussed other options, provided list of local  affordable PCPs.  Pt voiced interest in the [facility] and [facility].  NCM advised that [facility]  Internal Medicine providers are seeing pts at [facility]. Pt verbalized understanding. NCM set up appointment with Cammie Sickle, NP 10/5 at 1045.  Fuller Mandril, RN 06/29/2015,  3:53 PM

## 2015-06-29 NOTE — Progress Notes (Signed)
    Subjective   [name] is a 49 y.o. male that presents for a same day visit  1. Fever: Symptoms started about one month ago. Symptoms include fevers up to 103.5, nausea, vomiting with greenish emesis, headaches, chills and productive coughing. He was taking Dayquil and Asprin and symptoms improved until 3 days ago with symptoms. He has been trying to stay hydrated but vomits a lot of what he intakes. His blood sugars have been in the 300-400 range. He has constant burning chest pain from epigastric up to neck. He has some mild right upper quadrant abdominal pain. Intermittent rhinorrhea. He reports a 5lb weight loss in the last month with no TB exposure or recent travel.  ROS Per HPI  Social History  Substance Use Topics  . Smoking status: Former Smoker    Types: Cigarettes    Quit date: 09/03/2012  . Smokeless tobacco: Former Systems developer    Quit date: 09/03/2012  . Alcohol Use: 4.8 oz/week    6 Cans of beer, 2 Shots of liquor per week     Comment: weekends.    Allergies  Allergen Reactions  . Latex Rash    Pt states he is allergic to latex condoms    Objective   BP 103/65 mmHg  Pulse 112  Temp(Src) 101.7 F (38.7 C) (Oral)  Wt 195 lb (88.451 kg)  General: Ill appearing, no distress but is wretching HEENT: TMs not visible secondary to cerumen. Nares patent. Oropharynx clear, moist and without erythema. No cervical adenopathy. Respiratory/Chest: Clear to auscultation bilaterally without focal deficits. No wheezing.  Cardiovascular: Regular rhythm, tachycardia. No murmur.  Gastrointestinal: Soft, mild LLQ and RLQ tenderness with no rebound or guarding. Bowel sounds throughout.  Assessment and Plan   No orders of the defined types were placed in this encounter.    Fever SIRS Emesis  Send to ED for further evaluation, including UA, labs and CXR  With symptoms present for the past month, less likely acute bacterial illness, but would not feel comfortable sending  patient home with antipyretics and follow-up. Would like labs and CXR

## 2015-06-29 NOTE — ED Notes (Signed)
Reviewed rx's and discharge instructions with patient and family member. Patient verbalized understanding.

## 2015-06-29 NOTE — ED Notes (Signed)
Patient transported to Ultrasound 

## 2015-06-29 NOTE — ED Provider Notes (Signed)
CSN: [id number]     Arrival date & time 06/29/15  1041 History   First MD Initiated Contact with Patient 06/29/15 1109     Chief Complaint  Patient presents with  . Emesis  . Cough     (Consider location/radiation/quality/duration/timing/severity/associated sxs/prior Treatment) HPI 49 year old male with history of hypertension and insulin-dependent diabetes who presents with generalized malaise, abdominal pain, and cough. He reports one month history of feeling unwell, with subjective fevers, nausea, occasional vomiting, fatigue, chills, myalgias, and weight loss. He has not been taking his insulin during this time because he is no longer covered by medicaid and cannot afford his diabetes medications. Blood glucose at home typically 300-400s. During this time, also having nausea and vomiting after meals. More constipated recently.  Symptoms seemed to improve one week ago, but newly onset again over past 3 days.  Over the past week, he has had recurrent subjective fevers and chills, post-prandial nausea and vomiting, aches and pains over body. Today, he reports having right sided abdominal pain.  He has had urinary frequency, but no dysuria.  No diarrhea. Also having coughing, congestion, and runny nose.   Was in [employer] one month ago, but no hiking history or rash. No travel out of country. No history of incarceration. No TB exposures.    Past Medical History  Diagnosis Date  . Hypertension   . Diabetes mellitus     Type 2 DM  x 24 years  . Foreign body in left foot     with  infection  . Multiple rib fractures   . Allergy   . Meniscus tear    Past Surgical History  Procedure Laterality Date  . No past surgeries    . I&d extremity  04/23/2012    Procedure: IRRIGATION AND DEBRIDEMENT EXTREMITY;  Surgeon: Newt Minion, MD;  Location: [place];  Service: Orthopedics;  Laterality: Left;  Irrigation and Debridement Left Foot  . I&d extremity  09/18/2012    Procedure: IRRIGATION AND  DEBRIDEMENT EXTREMITY;  Surgeon: Newt Minion, MD;  Location: [place];  Service: Orthopedics;  Laterality: Left;  [name] Eye surgery    . Meniscus repair     Family History  Problem Relation Age of Onset  . Diabetes type II Mother   . Arthritis Mother   . Diabetes Mother   . Hyperlipidemia Mother   . Hypertension Mother   . Diabetes type II Sister   . Diabetes Father    Social History  Substance Use Topics  . Smoking status: Former Smoker    Types: Cigarettes    Quit date: 09/03/2012  . Smokeless tobacco: Former Systems developer    Quit date: 09/03/2012  . Alcohol Use: 4.8 oz/week    6 Cans of beer, 2 Shots of liquor per week     Comment: weekends.    Review of Systems 10/14 systems reviewed and are negative other than those stated in the HPI   Allergies  Latex  Home Medications   Prior to Admission medications   Medication Sig Start Date End Date Taking? Authorizing Provider  cephALEXin (KEFLEX) 500 MG capsule Take 1 capsule (500 mg total) by mouth 3 (three) times daily. 06/29/15   Forde Dandy, MD  cyclobenzaprine (FLEXERIL) 10 MG tablet Take 1 tablet (10 mg total) by mouth 3 (three) times daily as needed for muscle spasms. 01/04/15   Patrecia Pour, MD  insulin aspart (NOVOLOG) 100 UNIT/ML injection Use sliding scale twice a day with meals Patient  taking differently: Inject 6 Units into the skin 2 (two) times daily with breakfast and lunch. Use sliding scale twice a day with meals 03/05/14   Tatyana Kirichenko, PA-C  insulin aspart (NOVOLOG) 100 UNIT/ML injection Inject 8 Units into the skin 3 (three) times daily with meals. Use sliding scale twice daily with meals 06/29/15   Forde Dandy, MD  insulin detemir (LEVEMIR) 100 UNIT/ML injection Inject 0.1 mLs (10 Units total) into the skin 2 (two) times daily. Patient not taking: Reported on 02/23/2015 01/30/15   [name], DO  insulin detemir (LEVEMIR) 100 UNIT/ML injection Inject 0.1 mLs (10 Units total) into the skin 2 (two) times daily.  06/29/15   Forde Dandy, MD  insulin glargine (LANTUS) 100 UNIT/ML injection Inject 0.26 mLs (26 Units total) into the skin at bedtime. Patient not taking: Reported on 02/23/2015 03/05/14   Tatyana Kirichenko, PA-C  insulin glargine (LANTUS) 100 UNIT/ML injection Inject 0.26 mLs (26 Units total) into the skin at bedtime. 06/29/15   Forde Dandy, MD  lisinopril (PRINIVIL,ZESTRIL) 20 MG tablet Take 1 tablet (20 mg total) by mouth daily. 01/30/15   [name], DO  meclizine (ANTIVERT) 32 MG tablet Take 1 tablet (32 mg total) by mouth 3 (three) times daily as needed. Patient not taking: Reported on 01/18/2015 12/08/14   Patrecia Pour, MD  metFORMIN (GLUCOPHAGE) 500 MG tablet Take 1 tablet (500 mg total) by mouth 2 (two) times daily with a meal. 01/30/15   [name], DO  ondansetron (ZOFRAN) 4 MG tablet Take 1 tablet (4 mg total) by mouth every 8 (eight) hours as needed for nausea or vomiting. 06/29/15   Forde Dandy, MD  oxyCODONE-acetaminophen (PERCOCET) 10-325 MG per tablet Take 1 tablet by mouth 2 (two) times daily as needed for pain. 02/27/15   Patrecia Pour, MD  polyethylene glycol powder (GLYCOLAX/MIRALAX) powder Take 17 g by mouth 2 (two) times daily as needed for moderate constipation. Take as needed to produce 1 normal bowel movement per day. Patient not taking: Reported on 02/23/2015 01/30/15   [name], DO   BP 133/73 mmHg  Pulse 86  Temp(Src) 98.3 F (36.8 C) (Oral)  Resp 13  Ht 5\' 11"  (1.803 m)  Wt 196 lb 8 oz (89.132 kg)  BMI 27.42 kg/m2  SpO2 91% Physical Exam Physical Exam  Nursing note and vitals reviewed. Constitutional: Well developed, well nourished, non-toxic, and in no acute distress Head: Normocephalic and atraumatic.  Mouth/Throat: Oropharynx is clear and moist.  Neck: Normal range of motion. Neck supple.  Cardiovascular: Normal rate and regular rhythm.  No edema. Pulmonary/Chest: Effort normal and breath sounds normal.  Abdominal: Soft. Obese. Tenderness in right  upper and right lower quadrant. No rebound or guarding. Left CVA tenderness to percussion.  Musculoskeletal: Normal range of motion.  Neurological: Alert, no facial droop, fluent speech, moves all extremities symmetrically Skin: Skin is warm and dry.  Psychiatric: Cooperative  ED Course  Procedures (including critical care time) Labs Review Labs Reviewed  CBC WITH DIFFERENTIAL/PLATELET - Abnormal; Notable for the following:    WBC 13.7 (*)    HCT 38.8 (*)    MCHC 36.3 (*)    Neutro Abs 10.8 (*)    Eosinophils Absolute 1.2 (*)    All other components within normal limits  COMPREHENSIVE METABOLIC PANEL - Abnormal; Notable for the following:    Sodium 134 (*)    Chloride 99 (*)    Glucose, Bld  313 (*)    Creatinine, Ser 1.33 (*)    Albumin 3.1 (*)    All other components within normal limits  URINALYSIS W MICROSCOPIC - Abnormal; Notable for the following:    APPearance CLOUDY (*)    Glucose, UA >1000 (*)    Hgb urine dipstick SMALL (*)    Protein, ur >300 (*)    Bacteria, UA FEW (*)    Squamous Epithelial / LPF FEW (*)    All other components within normal limits  URINE CULTURE  LIPASE, BLOOD  I-STAT CG4 LACTIC ACID, ED  I-STAT CG4 LACTIC ACID, ED    Imaging Review Dg Chest 2 View  06/29/2015   CLINICAL DATA:  Fevers, nausea, vomiting, headaches, chills and productive cough for the past month.  EXAM: CHEST  2 VIEW  COMPARISON:  01/27/2015.  FINDINGS: Normal sized heart. Clear lungs. Mild lower thoracic spine degenerative changes.  IMPRESSION: No acute abnormality.   Electronically Signed   By: Claudie Revering M.D.   On: 06/29/2015 11:58   Ct Abdomen Pelvis W Contrast  06/29/2015   CLINICAL DATA:  Pt c/o RLQ pain, n/v/fever/cough; pt improved but then "relapsed" 4 days ago. Fevers up to 103.5  EXAM: CT ABDOMEN AND PELVIS WITH CONTRAST  TECHNIQUE: Multidetector CT imaging of the abdomen and pelvis was performed using the standard protocol following bolus administration of  intravenous contrast.  CONTRAST:  32mL OMNIPAQUE IOHEXOL 300 MG/ML  SOLN  COMPARISON:  None.  FINDINGS: Lung bases:  Clear.  Heart normal size.  Liver and spleen:  Normal.  Gallbladder: Distended. There are dependent stones. Increased density of the contents consistent with viscus bile. No convincing wall thickening or adjacent inflammation. No bile duct dilation.  Pancreas and adrenal glands:  Unremarkable.  Kidneys, ureters, bladder:  Normal.  Prostate gland: There is a 3.1 x 1.9 x 2.5 cm cystic mass arises from the left posterior prostate. The etiology and clinical significance of this is unclear.  Lymph nodes:  No adenopathy.  Ascites:  None.  Gastrointestinal: Stomach, small bowel and colon are unremarkable. Normal appendix visualized.  Musculoskeletal: Mild disc degenerative changes at L3-L4 and L4-L5. No osteoblastic or osteolytic lesions.  IMPRESSION: 1. No definite acute findings. 2. Gallbladder is distended with dependent stones and evidence of viscus bile, but no CT evidence of gallbladder inflammation. 3. 3.1 cm cystic mass arises from the left posterior prostate gland. This is likely a chronic finding. If there is rectal region pain, prostatitis with a prostatic abscess should be considered. 4. L3-L4-L4-L5 disc degenerative changes. 5. No other abnormalities.   Electronically Signed   By: Lajean Manes M.D.   On: 06/29/2015 14:56   US Abdomen Limited Ruq  06/29/2015   CLINICAL DATA:  Fever and right upper quadrant pain. History of hypertension and diabetes.  EXAM: US ABDOMEN LIMITED - RIGHT UPPER QUADRANT  COMPARISON:  CT of the abdomen and pelvis 06/29/2015  FINDINGS: Gallbladder:  Gallbladder is distended. Gallbladder wall is 2.0 mm in thickness. Gallbladder is filled with sludge and layering tiny stones which are too small to measure. No sonographic Murphy sign. The patient has been given pain medicine prior to exam.  Common bile duct:  Diameter: 4.3 mm  Liver:  No focal lesion identified. Within  normal limits in parenchymal echogenicity.  IMPRESSION: 1. Distended gallbladder containing sludge and stones. Gallbladder wall is normal thickness. 2. Negative sonographic Murphy's sign may not be reliable as the patient has been given pain medicine prior to exam.  3. No choledocholithiasis identified. Normal appearance of the liver.   Electronically Signed   By: Nolon Nations M.D.   On: 06/29/2015 17:34   I have personally reviewed and evaluated these images and lab results as part of my medical decision-making.    MDM   Final diagnoses:  Fever  Calculus of gallbladder without cholecystitis without obstruction  Pyelonephritis    In short, this is a 49 year old male who presents with fever, cough, nausea, vomiting, and abdominal pain. He is non-toxic and in no acute distress. Initially tachycardia and febrile, resolved with IVF and tylenol. Cardiopulmonary exam unremarkable. He appears dry on exam. Abdomen soft and non-peritoneal but with RUQ and RLQ tenderness to palpation. Also significant CVA tenderness reported on the left flank. Symptoms have been ongoing for one month, and it is possible hyperglycemia in the setting of medication non-compliance may have a role in why he feels poorly, has weight loss, and increased urine frequency. Although, this does not explain his fever today. CXR shows no PNA or other acute processes. CT abd pelvis performed, showing no acute intraabdominal processes but presence of cholelithiasis with distended gallbladder. RUQ [place] negative for acute cholecystitis.   Blood work reveals normal lactate, mild AKI (prerenal given vomiting and decreased PO intake), and mild leukocytosis of 13. UA shows numerous WBC unable to count with bacteria that may be consistent with UTI and given flank pain, fever, and myalgias may be secondary to pyelonephritis (though not really felt like could contribute to one month of symptoms without significant sepsis/shock). He will be treated as  symptoms may be consistent with that with course of antibiotics. No major risk factors for TB. No major traveling history or camping history. No IVDU or other major risk factors or secondary findings c/f for endocarditis. Serious bacterial infection seem less likely given one month of complaints without severe sepsis or shock. Symptoms may also be consistent with viral infection.   With IVF, reports significant improvement in symptoms. Pain after analgesics resolved and he eats a sandwich without any symptoms. Case management saw patient given loss of medicaid and arranged close outpatient follow-up and resources. Felt appropriate for discharge home given improvement in symptoms and tolerating PO. Strict return and follow-up instructions reviewed. He expressed understanding of all discharge instructions and felt comfortable with the plan of care.    Forde Dandy, MD 06/29/15 [phone]

## 2015-06-29 NOTE — Discharge Instructions (Signed)
Take medications as prescribed. Return without fail for worsening symptoms, including worsening pain, vomiting and unable to keep down food/fluids, persistent fevers despite treatment, or any other symptoms concerning to you.  Cholelithiasis Cholelithiasis (also called gallstones) is a form of gallbladder disease. The gallbladder is a small organ that helps you digest fats. Symptoms of gallstones are:  Feeling sick to your stomach (nausea).  Throwing up (vomiting).  Belly pain.  Yellowing of the skin (jaundice).  Sudden pain. You may feel the pain for minutes to hours.  Fever.  Pain to the touch. HOME CARE  Only take medicines as told by your doctor.  Eat a low-fat diet until you see your doctor again. Eating fat can result in pain.  Follow up with your doctor as told. Attacks usually happen time after time. Surgery is usually needed for permanent treatment. GET HELP RIGHT AWAY IF:   Your pain gets worse.  Your pain is not helped by medicines.  You have a fever and lasting symptoms for more than 2-3 days.  You have a fever and your symptoms suddenly get worse.  You keep feeling sick to your stomach and throwing up. MAKE SURE YOU:   Understand these instructions.  Will watch your condition.  Will get help right away if you are not doing well or get worse. Document Released: 03/11/2008 Document Revised: 05/26/2013 Document Reviewed: 03/17/2013 [facility] Patient Information 2015 [place], [place]. This information is not intended to replace advice given to you by your health care provider. Make sure you discuss any questions you have with your health care provider.  Pyelonephritis, Adult Pyelonephritis is a kidney infection. A kidney infection can happen quickly, or it can last for a long time. HOME CARE   Take your medicine (antibiotics) as told. Finish it even if you start to feel better.  Keep all doctor visits as told.  Drink enough fluids to keep your pee (urine)  clear or pale yellow.  Only take medicine as told by your doctor. GET HELP RIGHT AWAY IF:   You have a fever or lasting symptoms for more than 2-3 days.  You have a fever and your symptoms suddenly get worse.  You cannot take your medicine or drink fluids as told.  You have chills and shaking.  You feel very weak or pass out (faint).  You do not feel better after 2 days. MAKE SURE YOU:  Understand these instructions.  Will watch your condition.  Will get help right away if you are not doing well or get worse. Document Released: 10/31/2004 Document Revised: 03/24/2012 Document Reviewed: 03/13/2011 [facility] Patient Information 2015 [place], [place]. This information is not intended to replace advice given to you by your health care provider. Make sure you discuss any questions you have with your health care provider.

## 2015-06-30 LAB — URINE CULTURE

## 2015-06-30 LAB — CBG MONITORING, ED: Glucose-Capillary: 230 mg/dL — ABNORMAL HIGH (ref 65–99)

## 2015-07-03 ENCOUNTER — Telehealth: Payer: Self-pay | Admitting: Surgery

## 2015-07-03 NOTE — Telephone Encounter (Signed)
ED CM contacted patient with follow up appointment date 07/12/15 at 11:00 am at the [facility]. Patient verbalized understanding teach back done. No further CM needs identified

## 2015-07-12 ENCOUNTER — Ambulatory Visit: Payer: Self-pay | Admitting: Family Medicine

## 2015-07-31 ENCOUNTER — Ambulatory Visit: Payer: Self-pay | Admitting: Family Medicine

## 2015-08-27 IMAGING — CR DG FOOT COMPLETE 3+V*L*
3 series · 3 of 3 positions shown · non-contrast
Comparison: No priors.

CLINICAL DATA: 48-year-old male with history of trauma from a fall
8 days ago complaining of left foot pain.

EXAM:
LEFT FOOT - COMPLETE 3+ VIEW

[view not recorded (1 of 3)]
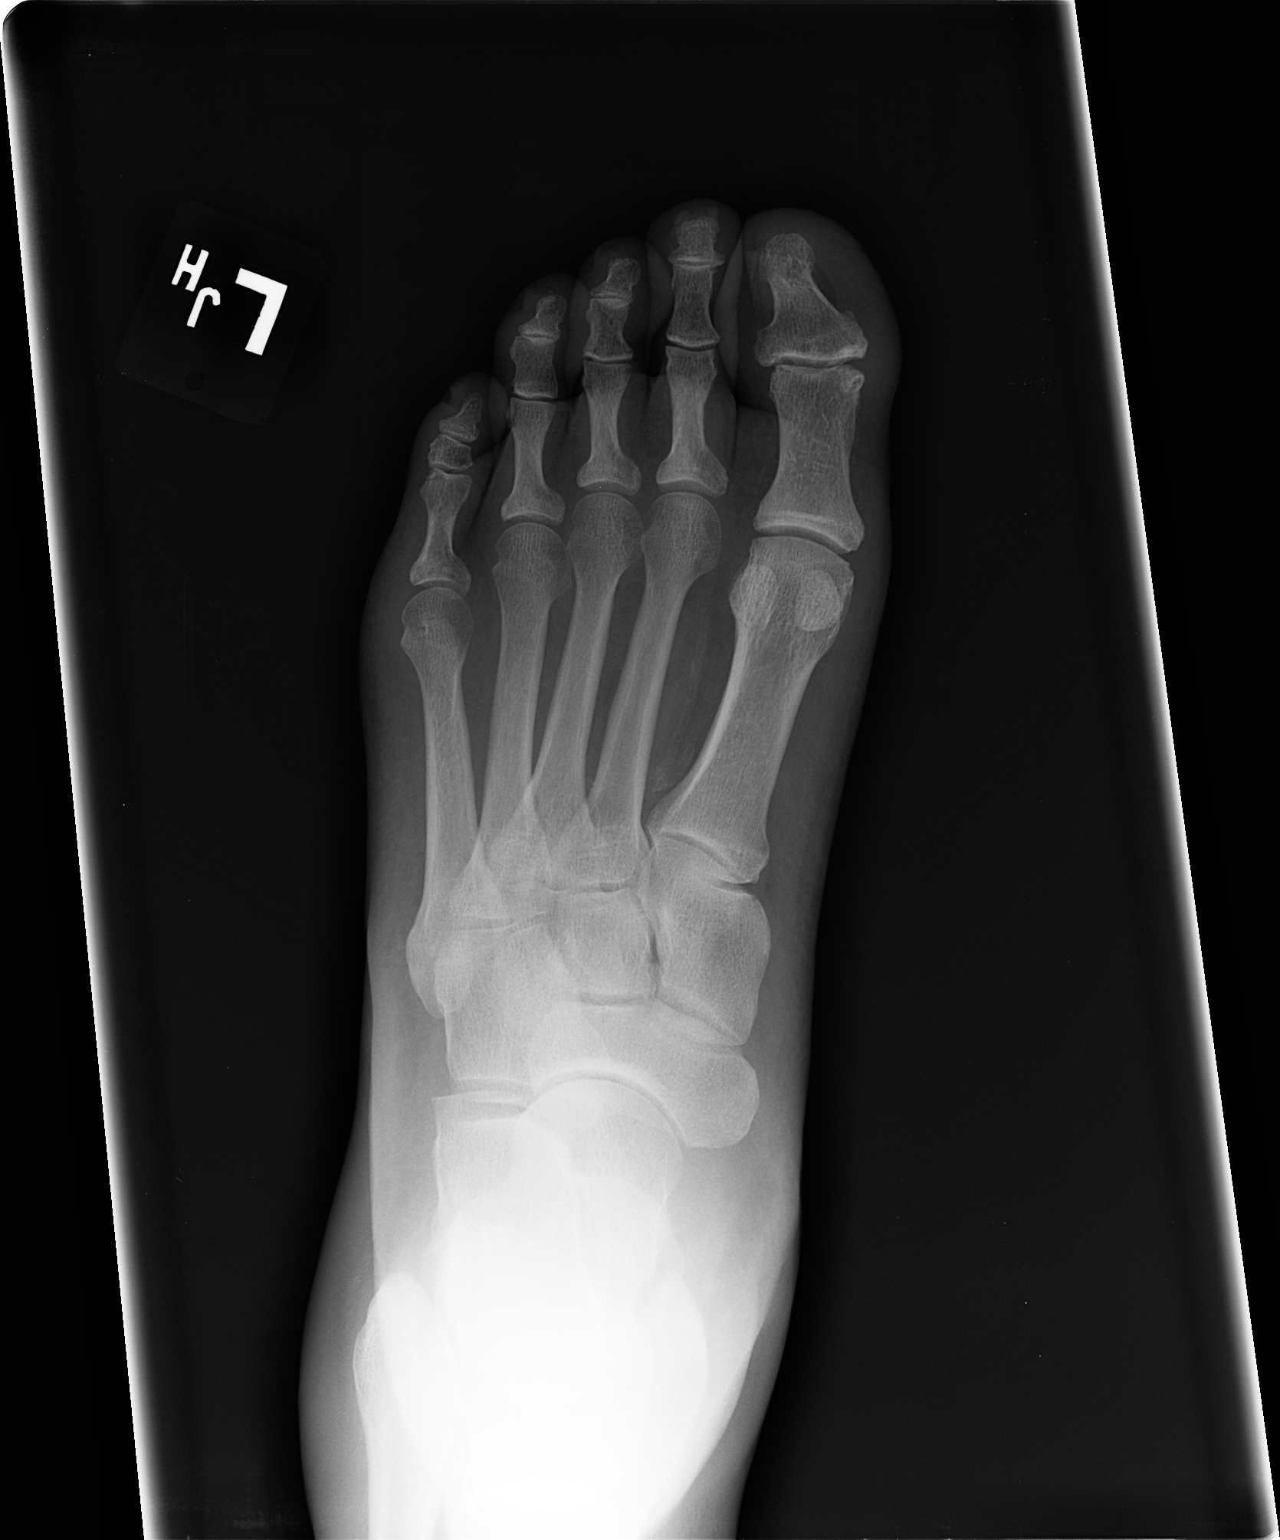

[view not recorded (2 of 3)]
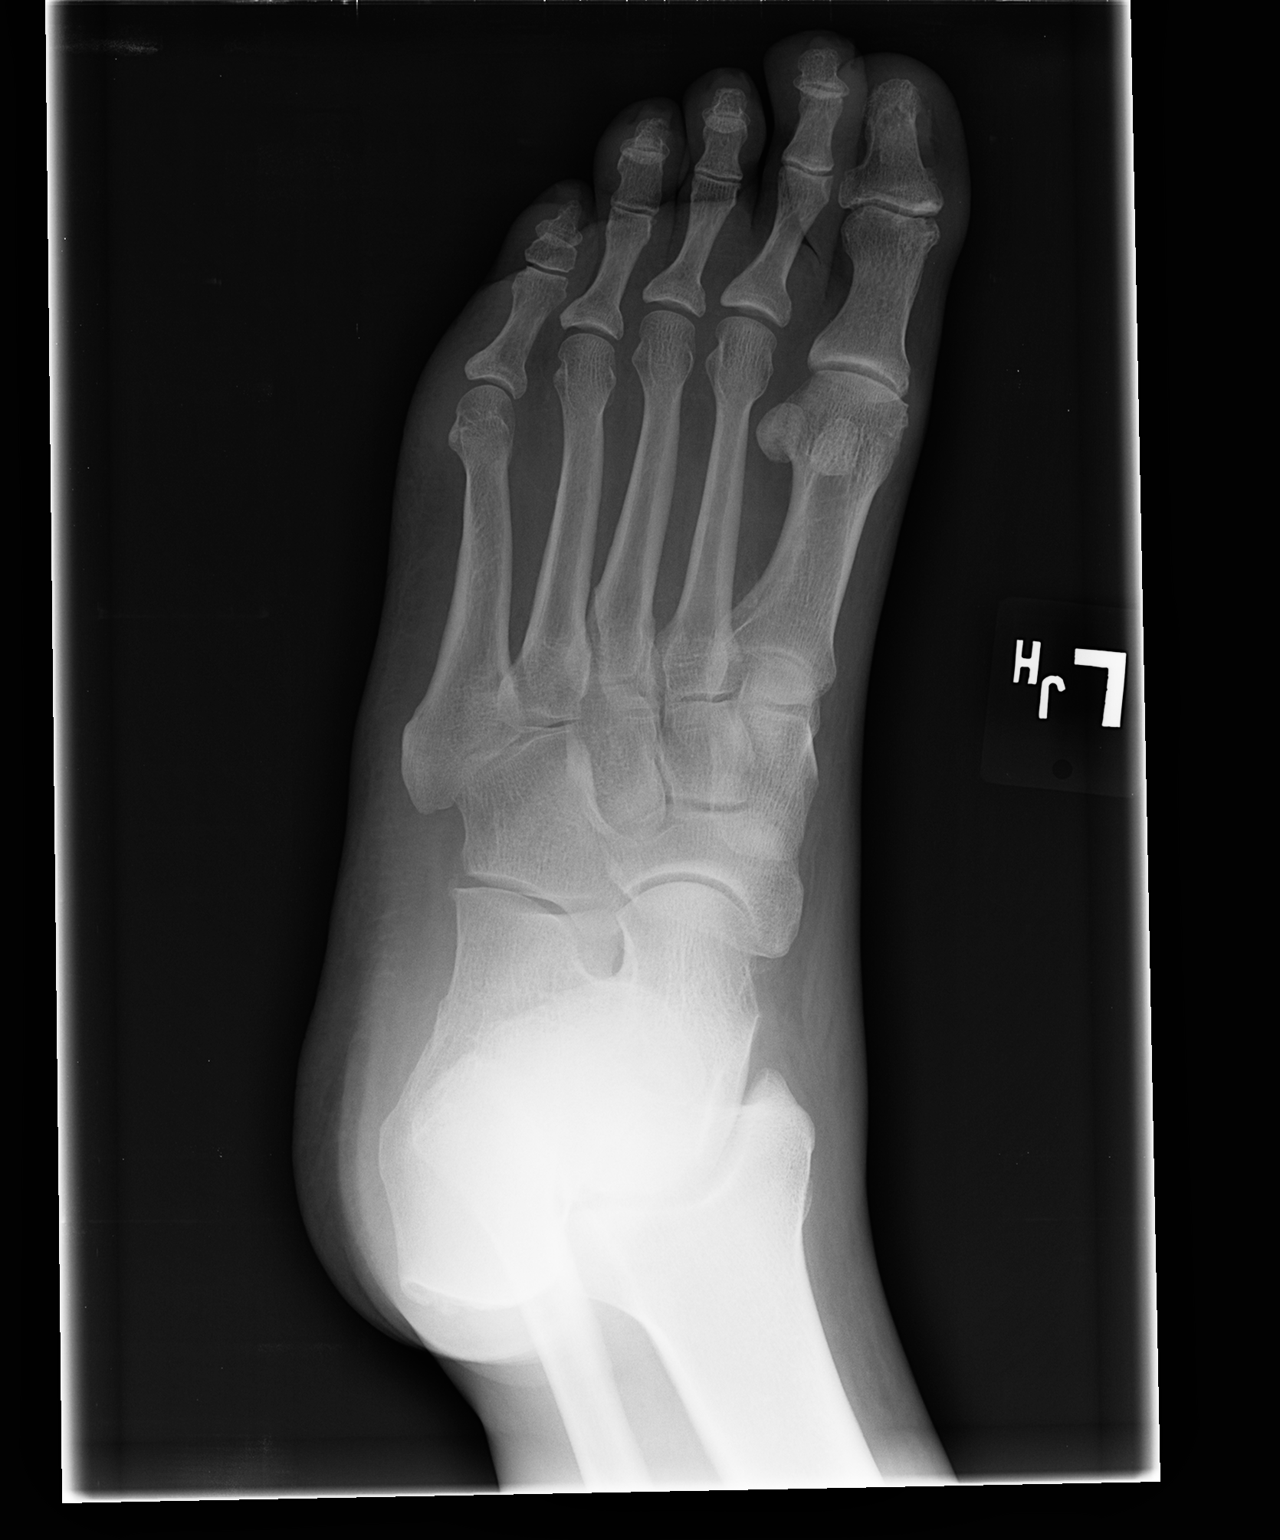

[view not recorded (3 of 3)]
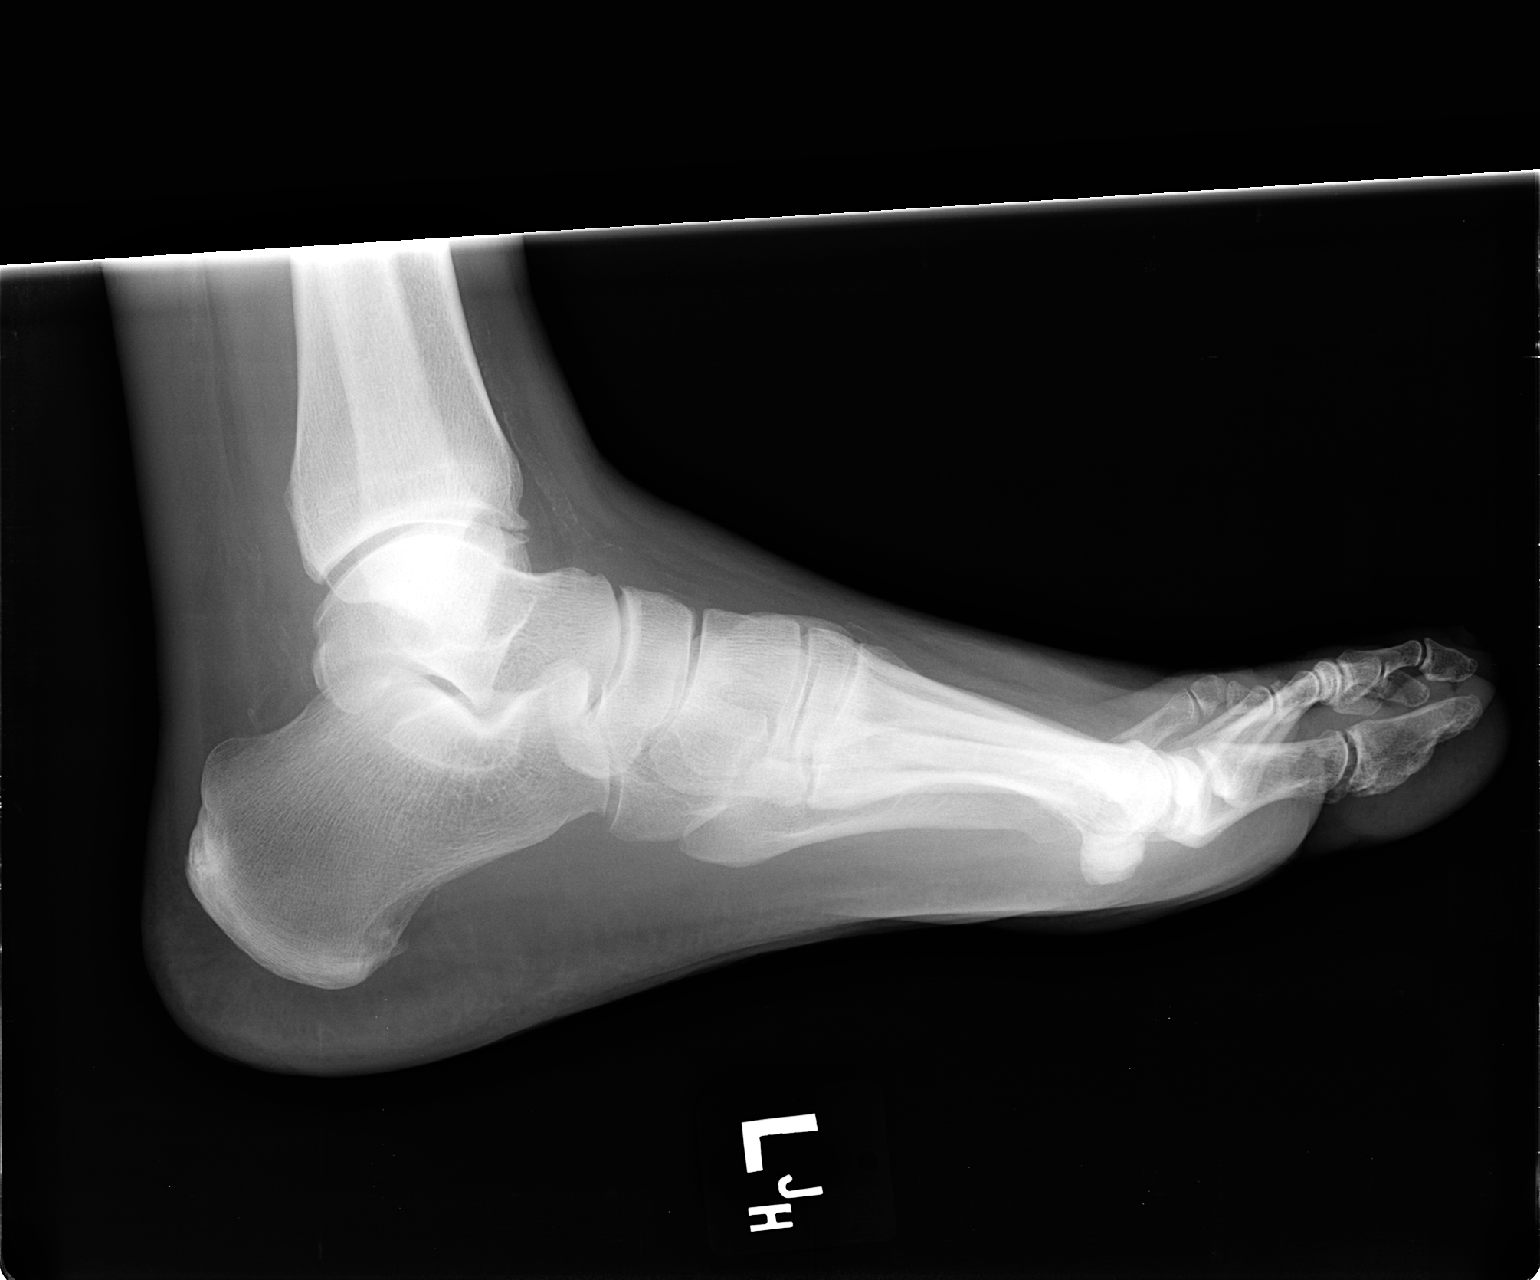

[3 of 3 positions shown; findings below may reference images not displayed]

FINDINGS: Multiple views of the left foot demonstrate no acute displaced
fracture, subluxation, dislocation, or soft tissue abnormality.
IMPRESSION: No acute radiographic abnormality of the left foot.

## 2016-04-06 HISTORY — PX: MENISCUS REPAIR: SHX5179

## 2016-05-22 ENCOUNTER — Emergency Department (HOSPITAL_COMMUNITY)
Admission: EM | Admit: 2016-05-22 | Discharge: 2016-05-22 | Disposition: A | Discharge status: Home / Self Care | Payer: Self-pay | Attending: Emergency Medicine | Admitting: Emergency Medicine

## 2016-05-22 ENCOUNTER — Emergency Department (HOSPITAL_COMMUNITY): Payer: Self-pay

## 2016-05-22 ENCOUNTER — Encounter (HOSPITAL_COMMUNITY): Payer: Self-pay

## 2016-05-22 DIAGNOSIS — M79675 Pain in left toe(s): Secondary | ICD-10-CM | POA: Insufficient documentation

## 2016-05-22 DIAGNOSIS — E109 Type 1 diabetes mellitus without complications: Secondary | ICD-10-CM | POA: Insufficient documentation

## 2016-05-22 DIAGNOSIS — Z91040 Latex allergy status: Secondary | ICD-10-CM | POA: Insufficient documentation

## 2016-05-22 DIAGNOSIS — I10 Essential (primary) hypertension: Secondary | ICD-10-CM | POA: Insufficient documentation

## 2016-05-22 DIAGNOSIS — Z87891 Personal history of nicotine dependence: Secondary | ICD-10-CM | POA: Insufficient documentation

## 2016-05-22 LAB — URINE MICROSCOPIC-ADD ON

## 2016-05-22 LAB — I-STAT CHEM 8, ED
BUN: 13 mg/dL (ref 6–20)
CALCIUM ION: 1.11 mmol/L — AB (ref 1.13–1.30)
CHLORIDE: 100 mmol/L — AB (ref 101–111)
CREATININE: 1.2 mg/dL (ref 0.61–1.24)
Glucose, Bld: 284 mg/dL — ABNORMAL HIGH (ref 65–99)
HEMATOCRIT: 43 % (ref 39.0–52.0)
Hemoglobin: 14.6 g/dL (ref 13.0–17.0)
Potassium: 4.1 mmol/L (ref 3.5–5.1)
SODIUM: 138 mmol/L (ref 135–145)
TCO2: 26 mmol/L (ref 0–100)

## 2016-05-22 LAB — I-STAT ARTERIAL BLOOD GAS, ED
BICARBONATE: 24.8 meq/L — AB (ref 20.0–24.0)
O2 Saturation: 94 %
PH ART: 7.414 (ref 7.350–7.450)
TCO2: 26 mmol/L (ref 0–100)
pCO2 arterial: 38.8 mmHg (ref 35.0–45.0)
pO2, Arterial: 69 mmHg — ABNORMAL LOW (ref 80.0–100.0)

## 2016-05-22 LAB — URINALYSIS, ROUTINE W REFLEX MICROSCOPIC
Bilirubin Urine: NEGATIVE
Glucose, UA: >1000 mg/dL — AB
KETONES UR: NEGATIVE mg/dL
LEUKOCYTES UA: NEGATIVE
NITRITE: NEGATIVE
SPECIFIC GRAVITY, URINE: 1.036 — AB (ref 1.005–1.030)
pH: 5.5 (ref 5.0–8.0)

## 2016-05-22 MED ORDER — SULFAMETHOXAZOLE-TRIMETHOPRIM 800-160 MG PO TABS
1.0000 | ORAL_TABLET | Freq: Once | ORAL | Status: AC
Start: 2016-05-22 — End: 2016-05-22
  Administered 2016-05-22: 1 via ORAL
  Filled 2016-05-22: qty 1

## 2016-05-22 MED ORDER — SULFAMETHOXAZOLE-TRIMETHOPRIM 800-160 MG PO TABS: 1.0000 | ORAL_TABLET | Freq: Two times a day (BID) | ORAL | 0 refills | Status: DC

## 2016-05-22 MED ORDER — OXYCODONE-ACETAMINOPHEN 7.5-325 MG PO TABS: 1.0000 | ORAL_TABLET | Freq: Four times a day (QID) | ORAL | 0 refills | Status: DC | PRN

## 2016-05-22 MED ORDER — OXYCODONE-ACETAMINOPHEN 5-325 MG PO TABS
2.0000 | ORAL_TABLET | Freq: Once | ORAL | Status: AC
Start: 2016-05-22 — End: 2016-05-22
  Administered 2016-05-22: 2 via ORAL
  Filled 2016-05-22: qty 2

## 2016-05-22 NOTE — ED Provider Notes (Signed)
[place] DEPT Provider Note   CSN: [id number] Arrival date & time: 05/22/16  0548  History   Chief Complaint Chief Complaint  Patient presents with  . Medication Refill  . Toe Pain    HPI [name] is a 50 y.o. male with PMH of HTN and poorly controlled Type I DM.  HPI   Patient presenting with L foot pain and hyperglycemia.   Patient reports five days ago, he noted severe pain in his L foot. His foot was evaluated he thinks by a physical therapist (patient is poor historian), who noted a large callus on the affected foot. Over the next few days, the pain continued to worsen, and callus began to appear more like an ulcer. Patient's wife reports last night the area was draining pus. Patient decided to come to ED this morning because the pain became unbearable.   Patient has history of Type I DM but has been out of medication for the past week. He no longer has Medicaid because his wife exceeds the financial limit, and has had difficulty affording medications for at least the past year. He received a three month free sample of insulin about a year ago, and has been trying to make it last the entire year. He only takes insulin when he thinks he really needs it, but has now completely run out. He does continue to measure his blood sugar, and has had readings consistently in the 300s-400s over the past week. Has never been diagnosed with DKA before or hospitalized. No prior diabetic foot wounds. Currently endorsing polyuria, polydipsia, vision changes, headaches, abdominal pain, and confusion. Also endorsing worsening numbness and tingling of lower extremities bilaterally.   Of note, patient previously seen at [facility] clinic, though has not been seen in about a year he reports due to financial difficulties.  Past Medical History:  Diagnosis Date  . Allergy   . Diabetes mellitus    Type 2 DM  x 24 years  . Foreign body in left foot    with  infection  . Hypertension   . Meniscus tear    . Multiple rib fractures     Patient Active Problem List   Diagnosis Date Noted  . Left leg pain 02/27/2015  . Encounter for chronic pain management 01/30/2015  . Rib pain on left side 01/27/2015  . Injury resulting from fall from height 12/09/2014  . Adjustment disorder with anxiety 12/09/2014  . Hematoma 11/17/2014  . Traumatic ecchymosis of lower back 11/04/2014  . Traumatic ecchymosis of left ankle 11/04/2014  . Broken rib 10/28/2014  . Splinter of right foot 07/05/2014  . Pain in right hip 12/10/2012  . Erectile dysfunction 10/21/2012  . HTN (hypertension), benign 04/14/2012  . DM (diabetes mellitus), type 2, uncontrolled ([place]) 04/13/2012    Past Surgical History:  Procedure Laterality Date  . EYE SURGERY    . I&D EXTREMITY  04/23/2012   Procedure: IRRIGATION AND DEBRIDEMENT EXTREMITY;  Surgeon: Newt Minion, MD;  Location: [place];  Service: Orthopedics;  Laterality: Left;  Irrigation and Debridement Left Foot  . I&D EXTREMITY  09/18/2012   Procedure: IRRIGATION AND DEBRIDEMENT EXTREMITY;  Surgeon: Newt Minion, MD;  Location: [place];  Service: Orthopedics;  Laterality: Left;  . MENISCUS REPAIR    . NO PAST SURGERIES       Home Medications    Prior to Admission medications   Medication Sig Start Date End Date Taking? Authorizing Provider  insulin aspart (NOVOLOG) 100 UNIT/ML injection  Use sliding scale twice a day with meals Patient taking differently: Inject 8 Units into the skin 2 (two) times daily with breakfast and lunch. Use sliding scale twice a day with meals 03/05/14  Yes Tatyana Kirichenko, PA-C  insulin detemir (LEVEMIR) 100 UNIT/ML injection Inject 0.1 mLs (10 Units total) into the skin 2 (two) times daily. 01/30/15  Yes [name], DO  insulin glargine (LANTUS) 100 UNIT/ML injection Inject 0.26 mLs (26 Units total) into the skin at bedtime. 06/29/15  Yes Forde Dandy, MD  insulin aspart (NOVOLOG) 100 UNIT/ML injection Inject 8 Units into the skin 3 (three)  times daily with meals. Use sliding scale twice daily with meals 06/29/15   Forde Dandy, MD  insulin glargine (LANTUS) 100 UNIT/ML injection Inject 0.26 mLs (26 Units total) into the skin at bedtime. Patient not taking: Reported on 02/23/2015 03/05/14   Tatyana Kirichenko, PA-C  lisinopril (PRINIVIL,ZESTRIL) 20 MG tablet Take 1 tablet (20 mg total) by mouth daily. 01/30/15   [name], DO  metFORMIN (GLUCOPHAGE) 500 MG tablet Take 1 tablet (500 mg total) by mouth 2 (two) times daily with a meal. 01/30/15   [name], DO  oxyCODONE-acetaminophen (PERCOCET) 7.5-325 MG tablet Take 1 tablet by mouth every 6 (six) hours as needed for severe pain. 05/22/16   [name], MD  sulfamethoxazole-trimethoprim (BACTRIM DS,SEPTRA DS) 800-160 MG tablet Take 1 tablet by mouth 2 (two) times daily. 05/22/16   [name], MD    Family History Family History  Problem Relation Age of Onset  . Diabetes type II Mother   . Arthritis Mother   . Diabetes Mother   . Hyperlipidemia Mother   . Hypertension Mother   . Diabetes type II Sister   . Diabetes Father     Social History Social History  Substance Use Topics  . Smoking status: Former Smoker    Types: Cigarettes    Quit date: 09/03/2012  . Smokeless tobacco: Former Systems developer    Quit date: 09/03/2012  . Alcohol use 4.8 oz/week    6 Cans of beer, 2 Shots of liquor per week     Comment: weekends.     Allergies   Latex  Review of Systems Review of Systems  Constitutional: Positive for fatigue.  Eyes: Positive for visual disturbance.  Respiratory: Negative for shortness of breath.   Cardiovascular: Negative for chest pain.  Gastrointestinal: Positive for abdominal pain. Negative for nausea and vomiting.  Endocrine: Positive for polydipsia and polyuria.  Skin: Positive for wound (L foot ulcer).  Neurological: Positive for dizziness, weakness, light-headedness, numbness and headaches.  Psychiatric/Behavioral: Positive for  agitation, confusion and decreased concentration.   Physical Exam Updated Vital Signs BP 131/98   Pulse 91   Temp 98.2 F (36.8 C) (Oral)   Resp 17   Ht 5\' 11"  (1.803 m)   Wt 89.8 kg   SpO2 100%   BMI 27.62 kg/m   Physical Exam  Constitutional: He is oriented to person, place, and time. He appears well-developed and well-nourished. No distress.  HENT:  Head: Normocephalic and atraumatic.  Dry mucous membranes  Eyes: Conjunctivae and EOM are normal. Pupils are equal, round, and reactive to light. Right eye exhibits no discharge. Left eye exhibits no discharge.  Neck: Normal range of motion. Neck supple.  Cardiovascular: Normal rate, regular rhythm, normal heart sounds and intact distal pulses.   No murmur heard. Pulmonary/Chest: Effort normal and breath sounds normal. No respiratory distress. He has  no wheezes.  Abdominal: Soft. Bowel sounds are normal. He exhibits no distension. There is no tenderness.  Musculoskeletal: He exhibits no edema.  DP pulses present bilaterally.   Neurological: He is alert and oriented to person, place, and time.  Lack of sensation to light touch of feet bilaterally  Skin:  Ulcer of plantar surface of L foot ~1cmx1.5cm at base of great toe. No drainage; mild surrounding erythema; exquisitely tender to palpation.   Psychiatric: He has a normal mood and affect. His behavior is normal.   ED Treatments / Results  Labs (all labs ordered are listed, but only abnormal results are displayed) Labs Reviewed  URINALYSIS, ROUTINE W REFLEX MICROSCOPIC (NOT AT [facility]) - Abnormal; Notable for the following:       Result Value   APPearance CLOUDY (*)    Specific Gravity, Urine 1.036 (*)    Glucose, UA >1000 (*)    Hgb urine dipstick SMALL (*)    Protein, ur >300 (*)    All other components within normal limits  URINE MICROSCOPIC-ADD ON - Abnormal; Notable for the following:    Squamous Epithelial / LPF 6-30 (*)    Bacteria, UA RARE (*)    Casts GRANULAR CAST  (*)    All other components within normal limits  I-STAT CHEM 8, ED - Abnormal; Notable for the following:    Chloride 100 (*)    Glucose, Bld 284 (*)    Calcium, Ion 1.11 (*)    All other components within normal limits  I-STAT ARTERIAL BLOOD GAS, ED - Abnormal; Notable for the following:    pO2, Arterial 69.0 (*)    Bicarbonate 24.8 (*)    All other components within normal limits    EKG  EKG Interpretation None       Radiology Dg Foot Complete Left  Result Date: 05/22/2016 CLINICAL DATA:  Diabetic ulcer level of first metatarsal for 5 days EXAM: LEFT FOOT - COMPLETE 3+ VIEW COMPARISON:  November 04, 2014 FINDINGS: Frontal, oblique, and lateral views were obtained. There is soft tissue edema volar to the distal metatarsals. No soft tissue air or radiopaque foreign body is seen in these areas. No fracture or dislocation. There is mild narrowing of the first IP joint. Other joint spaces appear unremarkable. No erosive change or bony destruction. There are small calcaneal spurs. There are foci of arterial vascular calcification. IMPRESSION: No erosive change or bony destruction. Soft tissue swelling volar to the distal metatarsals without radiographically demonstrable soft tissue air or radiopaque foreign body. No fracture or dislocation. Mild narrowing first IP joint. Multiple areas of vascular calcification consistent with diabetes mellitus. Electronically Signed   By: Lowella Grip III M.D.   On: 05/22/2016 10:38    Procedures Procedures (including critical care time)  Medications Ordered in ED Medications  sulfamethoxazole-trimethoprim (BACTRIM DS,SEPTRA DS) 800-160 MG per tablet 1 tablet (not administered)  oxyCODONE-acetaminophen (PERCOCET/ROXICET) 5-325 MG per tablet 2 tablet (2 tablets Oral Given 05/22/16 1101)   Initial Impression / Assessment and Plan / ED Course  I have reviewed the triage vital signs and the nursing notes.  Pertinent labs & imaging results that were  available during my care of the patient were reviewed by me and considered in my medical decision making (see chart for details).  Clinical Course   CBG - 284 UA - >1000 glucose, no ketones  ABG - WNL (pH 7.4, bicarb 25)  Final Clinical Impressions(s) / ED Diagnoses   Final diagnoses:  Great toe  pain, left   Patient presenting with diabetic foot wound to L foot. Also with difficult social situation, as patient Type I DM but cannot afford medications. Not in DKA. Imaging with no signs of osteo. Given erythema and tenderness on physical exam, likely cellulitis, however patient not septic-appearing so appropriate for discharge. Given one dose of Bactrim in ED, and discharged with 9 additional days of Bactrim BID. Also given Percocet 5-325 #20. Consider referral to wound clinic for management.   Patient formerly seen at [facility] (last seen late 2016), so appointment scheduled at [facility] tomorrow. Patient can hopefully meet with financial assistance coordinator at that time to determine ways to obtain his medications. Was also provided with orange card form in ED by ED social worker.   New Prescriptions New Prescriptions   OXYCODONE-ACETAMINOPHEN (PERCOCET) 7.5-325 MG TABLET    Take 1 tablet by mouth every 6 (six) hours as needed for severe pain.   SULFAMETHOXAZOLE-TRIMETHOPRIM (BACTRIM DS,SEPTRA DS) 800-160 MG TABLET    Take 1 tablet by mouth 2 (two) times daily.     [name], MD 05/22/16 1235    Nat Christen, MD 05/26/16 [phone]

## 2016-05-22 NOTE — ED Notes (Signed)
Went to [employer]

## 2016-05-22 NOTE — Discharge Instructions (Addendum)
Please go to your follow-up appointment at the [facility] tomorrow morning (8/17) at 11:30 AM. You will be seeing Dr. Wendy Poet. You can also talk with our financial assistance coordinator to help make your medications more affordable.   [place],  [phone]  Also, please continue to take your antibiotics (Bactrim). Take one dose tonight, then twice a day for the next 10 days. Bactrim costs $4 at [facility].

## 2016-05-22 NOTE — Progress Notes (Signed)
Spoke to patient regarding primary care resources and the [facility] orange card. Patient sts he currently has no pcp and lost his insurance a few months ago. Follow up appointment made with [facility] for Sept 29, 17 @ 11:00am, pt verbalized understanding of the upcoming appointnment. Spoke to patients provider who is from [facility] practice who sts she could possibly get a sooner appointment with the practice. Orange card application provided and explained, pt instructed to contact me once application is complete for an appointment to obtain the orange card. My contact information provided for any future questions or concerns. No other [place] Specialist needs identified at this time.    [place] Specialist [id number] [phone]

## 2016-05-22 NOTE — ED Notes (Signed)
Requesting pain meds. MD notified

## 2016-05-22 NOTE — ED Notes (Signed)
Brought patient back to room; patient undressed, in gown, on continuous pulse oximetry and blood pressure cuff 

## 2016-05-22 NOTE — ED Triage Notes (Signed)
Pt  States he is type 1 DM; Pt states he lost Medicaid so he has not seen PCP in over a year; pt states he ran out of DM meds last week; Pt states he has has increased neuropathy and notice a wound appearing on left big toe; Pt states he has had multiple surgeries on bilateral feet. Pt states pain in foot is 9/10 on arrival; Pt a&ox 4 on arrival.

## 2016-05-22 NOTE — ED Notes (Signed)
D/C instructions reviewed with pt. And pt. Verbalizes understanding of medications and follow up appts.

## 2016-05-23 ENCOUNTER — Ambulatory Visit (INDEPENDENT_AMBULATORY_CARE_PROVIDER_SITE_OTHER): Payer: Self-pay | Admitting: Family Medicine

## 2016-05-23 ENCOUNTER — Encounter: Payer: Self-pay | Admitting: Family Medicine

## 2016-05-23 VITALS — BP 93/62 | HR 106 | Ht 71.0 in | Wt 197.0 lb

## 2016-05-23 DIAGNOSIS — L97529 Non-pressure chronic ulcer of other part of left foot with unspecified severity: Secondary | ICD-10-CM

## 2016-05-23 DIAGNOSIS — E1140 Type 2 diabetes mellitus with diabetic neuropathy, unspecified: Secondary | ICD-10-CM

## 2016-05-23 DIAGNOSIS — E1165 Type 2 diabetes mellitus with hyperglycemia: Secondary | ICD-10-CM

## 2016-05-23 DIAGNOSIS — E11621 Type 2 diabetes mellitus with foot ulcer: Secondary | ICD-10-CM

## 2016-05-23 DIAGNOSIS — E118 Type 2 diabetes mellitus with unspecified complications: Secondary | ICD-10-CM

## 2016-05-23 LAB — POCT GLYCOSYLATED HEMOGLOBIN (HGB A1C): HEMOGLOBIN A1C: 9.6

## 2016-05-23 NOTE — Patient Instructions (Signed)
Your left foot has a "mal perforans" ulcer.  This may take a while to heal.   To heal this ulcer, you will need to not bear weight on the left foot for 23 out of 24 hours a day.  Apply antibiotic ointment daily to wound and then re-wrap.  For your Diabetes, Start NPH Insulin, 14 units injected each morning and 6 units each evening Start the metformin, one tablet daily for one week, then one talbet twice a day  Go to [employer] to apply for the "[employer] as soon as possible.  Bring all documents requested on the paper that addresses applying for the [employer].   Follow up at the [facility] on Monday to reassess your diabetic foot ulcer and your blood sugar control.

## 2016-05-24 ENCOUNTER — Encounter: Payer: Self-pay | Admitting: Family Medicine

## 2016-05-24 DIAGNOSIS — L97509 Non-pressure chronic ulcer of other part of unspecified foot with unspecified severity: Secondary | ICD-10-CM

## 2016-05-24 DIAGNOSIS — E11621 Type 2 diabetes mellitus with foot ulcer: Secondary | ICD-10-CM | POA: Insufficient documentation

## 2016-05-24 MED ORDER — INSULIN NPH (HUMAN) (ISOPHANE) 100 UNIT/ML ~~LOC~~ SUSP: 14.0000 [IU] | Freq: Once | SUBCUTANEOUS | Status: DC

## 2016-05-24 MED ORDER — INSULIN NPH (HUMAN) (ISOPHANE) 100 UNIT/ML ~~LOC~~ SUSP: 6.0000 [IU] | Freq: Every evening | SUBCUTANEOUS | Status: DC

## 2016-05-24 NOTE — Assessment & Plan Note (Addendum)
Lab Results  Component Value Date   HGBA1C 9.6 05/23/2016   Doubt that patient is truly a Type I as he claimed given his lack of acidosis off insulin therapy.   Patient previously on Lantus 26 units daily with metformin 1000 mg twice a day Rx Relion NPH 14 units in Am and 6 units in PM.  Titration by PCP or Dr Valentina Lucks Rx Metformin 500 mg tab daily for 7 days then 1 tab twice a day. Titration to Max dose or tolerance by PCP.  Patient encouraged to measure and record morning fasting CBGs, then bring them with him to his next ov next week.    Usual labs for DM chronic management once [employer].

## 2016-05-24 NOTE — Progress Notes (Signed)
Subjective:    [name] is a 50 y.o. male who presents for evaluation of a foot ulcer. The patient has had an ulcer of his left forefoot sole, area of first metatarsal head for 4 days.  This has been treated at home, and at ED yesterday with doxycycline.  Past history significant for diabetes and left foot wound treatments . The patient reports pain, but denies drainage and fever.  The following portions of the patient's history were reviewed and updated as appropriate: allergies, current medications, past family history, past medical history, past social history, past surgical history and problem list.   Review of Systems Pertinent items are noted in HPI.    Objective:    BP 93/62   Pulse (!) 106   Ht 5' 11" (1.803 m)   Wt 197 lb (89.4 kg)   BMI 27.48 kg/m   General:  alert and appears stated age  Lungs:    Heart:    Abdomen:   Pulse exam:: 2+ and symmetric  Neurologic:  plantar sensation intact on touch  testing; diminished   Lower extremity ulcer located on left plantar surface at area first metatarsal head Mild increased warmth overlying plantar surfce at first MT head area of sole.  No obvious erythema around area. No edema of area.  Thicken, dystrophic nails.  Overlying callus of head of first MT was paired down with #15 blade after prep with betadine and alcohol swab.  Topical anesthetic with spray Ethyl chloride.   Paring surface of callus revealed a ~3 mm circular hole that was A 4 mm deep.  No expressing discharge from opening.  Hole probed with sterile Qtip without obvious palpation of bone.     Assessment/Plan: 1. Mal perforans, diabetic foot ulcer  Diabetic foot ulcer without signs of infection other pain and mild increase warmth at site.  Complicated by: callus formation of left MT head. .        Foot care instructions provided. debridement of callus  Complete Doxycycline 10 day oral therapy started in ED yesterday Non-weight bearing 23 of 24 hours a day on  left foot.  Topical Bacitracin Antibiotic ointment applied daily to wound opening.  Follow up on 8/21 at [facility] to reassessment of wound and referral to [place] center, if his [place] has been  Patient may be able to bear weight on left foot using an Orthowedge Forefoot Off-Loading Healing Shoe while awaiting referral to wound center. .  None available in [facility] currently.        Patient ID: [name], male   DOB: [birth year], 50 y.o.   MRN: [id number]  CHRONIC DIABETES  Patient reports diabetes since age 54.  He says he was diagnosed with DM type One. He has been without insulin for many months, with his last ov at [facility] 06/29/15, and his last ov [facility] for Diabetes management was 01/30/15 when, similar to current office visit, he had not been taking his insulin for a year because he lost his insurance.  This is what he said this visit as well. .  Disease Monitoring  Blood Sugar Ranges: he rarely checks his CBGs but when he does they are in 200's mostly with occasional 300s.   Polyuria: yes   Visual problems: yes   Medication Compliance: no  Medication Side Effects  Hypoglycemia: no   Preventitive Health Care             Daily Aspirin: No  Statin: No             Dental evaluation in 6-months:NO             Recent eGFR: Estimated Creatinine Clearance: 80.2 mL/min (by C-G formula based on SCr of 1.2 mg/dL).05/22/16              ACEI: History of taking Lisinopril  Eye Exam: no  Foot Exam: no    PSH: Arthoscopic left knee debridment last month by Dr Daldorf.   SH: Patient has open workman's compensation case for fall at work.  Patient self identifies as a Native American.  

## 2016-05-24 NOTE — Assessment & Plan Note (Addendum)
1. Mal perforans, diabetic foot ulcer  Diabetic foot ulcer without signs of infection other pain and mild increase warmth at site. No obvious palpable bone on probing wound today.   Origin of Mal Perforans is the callus formation of left MT head. .      If Orange card obtain and concern for osteomyelitis underlying wound develops, then imaging for osteo would be appropriate, along with Ankle-Brachial Index to assess arterial supply to left foot.   If [place] Card covers podiatry b Celesta Gentile, DPM , then would refer patient to him for chronic monitoring and care of diabetic feet.  I do not know if diabetic shoes can be obtained on the [facility] card.  This may need to be asked of Ms Laurance Flatten, [place].   Foot care instructions provided. debridement of callus today. Keep calluses of feet debrided.   Complete Doxycycline 10 day oral therapy started in ED yesterday Non-weight bearing 23 of 24 hours a day on left foot.  Topical Bacitracin Antibiotic ointment applied daily to wound opening.  Follow up on 8/21 at [facility] to reassessment of wound and referral to [place] center, if his [place] has been and wound is failing to show improvement with 2 weeks of current care of off-loading left foot.   Information about enrolling in [employer] given to patient.    Patient may be able to bear weight on left foot using an Orthowedge Forefoot Off-Loading Healing Shoe while awaiting referral to wound center. .  None available in [facility] currently.

## 2016-05-27 ENCOUNTER — Ambulatory Visit (INDEPENDENT_AMBULATORY_CARE_PROVIDER_SITE_OTHER): Payer: Self-pay | Admitting: Student

## 2016-05-27 VITALS — BP 94/61 | HR 100 | Temp 98.4°F | Wt 196.0 lb

## 2016-05-27 DIAGNOSIS — E1165 Type 2 diabetes mellitus with hyperglycemia: Secondary | ICD-10-CM

## 2016-05-27 DIAGNOSIS — E118 Type 2 diabetes mellitus with unspecified complications: Secondary | ICD-10-CM

## 2016-05-27 LAB — LIPID PANEL
CHOLESTEROL: 260 mg/dL — AB (ref 125–200)
HDL: 36 mg/dL — AB (ref >=40)
TRIGLYCERIDES: 460 mg/dL — AB (ref <150)
Total CHOL/HDL Ratio: 7.2 Ratio — ABNORMAL HIGH (ref <=5.0)

## 2016-05-27 NOTE — Progress Notes (Signed)
   Subjective:    Patient ID: [name] is a 50 y.o. old male.  CC:   HPI #Follow up on diabetes: FBG and RBG in the range of 143 to 220's. His most recent results are in 140 for FBG and RBG. Uses 14 units in the morning and 6 units of NPH at night. He is also on metformin 500 mg twice daily. Denies sign of hyperglycemia hypoglycemia has a diabetic ulcer that is getting better. He is still applying for the orange card. He doesn't have diabetic shoe. His wound is getting better. Denies discharge or fever. He still taking his Bactrim.  Has DM since he was 50 years of age.  Laser eye surgery 4 years ago. Hasn't been to an eye doctor since then. Reports numbness in his legs, off and on. Able to see the bottom of his feet easily.   PMH: reviewed SH: denies smoking  Review of Systems Per HPI Objective:   Vitals:   05/27/16 1150  BP: 94/61  Pulse: 100  Temp: 98.4 F (36.9 C)  TempSrc: Oral  Weight: 196 lb (88.9 kg)    GEN: appears well, no apparent distress. CVS: RRR, normal heart sounds,  2+ dorsalis pedis pulses bilaterally. RESP: no increased work of breathing, good air movement bilaterally, no crackles or wheeze     SKIN: Diabetic wound at the base of his left big toe healing with callus. No sign of cellulitis or surrounding tissue infection.  NEURO: alert and oriented appropriately, no gross defecits  PSYCH: appropriate mood and affect      Assessment & Plan:  DM (diabetes mellitus), type 2, uncontrolled Blood sugars improving per patient (from 220s to 143 most recently). No sign of hypo-or hyperglycemia. Gave diabetic blood glucose and list of diets according to glycemic index.  -Urine microalbumin to creatinine ratio today -LDL to stratify is ASCVD risk score. -Wound care and ophthalmology referral when he gets his orange card. -No changes to his diabetes medication today -Advised to check his foot frequently -Follow up in 3 months  ;

## 2016-05-27 NOTE — Patient Instructions (Addendum)
It was great seeing you today! We have addressed the following issues today  1. Diabetes: Continue taking the metformin and insulin as they are. I also recommend checking the blood sugar 3 times a day (before breakfast, before your lunch or dinner and 2 hours after your lunch or dinner). Please use a blood sugar diary book to record your blood glucoses and bring the book to the clinic when he come back for your next visit. I also recommend checking do feet and the ulcer daily. Please give [name] a call if you want is not healing. Otherwise I will see her back in 3 months for follow-up on her diabetes.     I am checking the cholesterol levels today. I may get in touch with you if I need to add new medication    If we did any lab work today, and the results require attention, either me or my nurse will get in touch with you. If everything is normal, you will get a letter in mail. If you don't hear from [name] in two weeks, please give [name] a call. Otherwise, I look forward to talking with you again at our next visit. If you have any questions or concerns before then, please call the clinic at [phone].  Please bring all your medications to every doctors visit   Sign up for My Chart to have easy access to your labs results, and communication with your Primary care physician.    Please check-out at the front desk before leaving the clinic.   Take Care,       Exercise at least 150 minutes per week, including weight resistance exercises 3 or 4 times per week.   Choose healthier foods such as 100% whole grains, vegetables, fruits, beans, nut seeds, olive oil, most vegetable oils, fat-free dietary, wild game and fish.  Avoid sweet tea, other sweetened beverages, soda, fruit juice, cold cereal and milk and trans fat.  Try to lose at least 7-10% of your current body weight.

## 2016-05-27 NOTE — Assessment & Plan Note (Addendum)
Blood sugars improving per patient (from 220s to 143 most recently). No sign of hypo-or hyperglycemia. Gave diabetic blood glucose and list of diets according to glycemic index.  -Urine microalbumin to creatinine ratio today -LDL to stratify is ASCVD risk score. -Wound care and ophthalmology referral when he gets his orange card. -No changes to his diabetes medication today -Advised to check his foot frequently -Follow up in 3 months

## 2016-05-28 ENCOUNTER — Telehealth: Payer: Self-pay | Admitting: Student

## 2016-05-28 ENCOUNTER — Encounter: Payer: Self-pay | Admitting: Student

## 2016-05-28 DIAGNOSIS — E785 Hyperlipidemia, unspecified: Secondary | ICD-10-CM

## 2016-05-28 DIAGNOSIS — E781 Pure hyperglyceridemia: Secondary | ICD-10-CM | POA: Insufficient documentation

## 2016-05-28 LAB — MICROALBUMIN / CREATININE URINE RATIO: Creatinine, Urine: 294 mg/dL (ref 20–370)

## 2016-05-28 MED ORDER — ATORVASTATIN CALCIUM 40 MG PO TABS: 40.0000 mg | ORAL_TABLET | Freq: Every day | ORAL | 3 refills | Status: DC

## 2016-05-28 NOTE — Telephone Encounter (Signed)
Called patient in regards to discuss about starting statin as his ASCVD risk is high. Patient is agreeable to this. I have sent prescription for Lipitor 40 mg to [facility] on [address] per patient's request.

## 2016-05-28 NOTE — Telephone Encounter (Signed)
Called his Lipitor to [facility] on [address]. Patient has no insurance. Gave pharmacy GoodRx coupon that has brought patient's price to $12.80 for 30-day supply. Called and notified patient about this and he was appreciative. Also recommended taking fish oil for his elevated triglyceride.

## 2016-06-27 ENCOUNTER — Ambulatory Visit: Payer: Self-pay

## 2016-06-27 ENCOUNTER — Other Ambulatory Visit: Payer: Self-pay | Admitting: Student

## 2016-06-27 NOTE — Telephone Encounter (Signed)
Patient requesting refill on Bactrim. DM wound still not healed completely. Please advise.

## 2016-06-28 NOTE — Telephone Encounter (Signed)
Patient needs to come in for a SDA to have the Diabetic foot ulcer examined. Patient was seen last by Dr. Cyndia Skeeters on May 27, 2016, no recommendations given for further antibiotics. Received 10 days of Bactrim in the ED on August 16th.

## 2016-06-28 NOTE — Telephone Encounter (Signed)
Patient calling again regarding refill on Bactrim. Uses [facility]

## 2016-07-02 ENCOUNTER — Ambulatory Visit: Payer: Worker's Compensation | Attending: Orthopaedic Surgery | Admitting: Physical Therapy

## 2016-07-02 DIAGNOSIS — M6281 Muscle weakness (generalized): Secondary | ICD-10-CM | POA: Diagnosis present

## 2016-07-02 DIAGNOSIS — M25562 Pain in left knee: Secondary | ICD-10-CM | POA: Diagnosis not present

## 2016-07-02 DIAGNOSIS — M25662 Stiffness of left knee, not elsewhere classified: Secondary | ICD-10-CM

## 2016-07-02 NOTE — Therapy (Signed)
[place], [place], [address] Phone: [phone]   Fax:  [phone]  Physical Therapy Evaluation  Patient Details  Name: [name] MRN: [id number] Date of Birth: 07/08/1966 Referring Provider: Rodell Perna Md  Encounter Date: 07/02/2016      PT End of Session - 07/02/16 1516    Visit Number 1   Number of Visits 13   Date for PT Re-Evaluation 08/13/16   Authorization Type workers comp   PT Start Time 1500   PT Stop Time 1547   PT Time Calculation (min) 47 min   Activity Tolerance Patient tolerated treatment well   Behavior During Therapy [facility] for tasks assessed/performed      Past Medical History:  Diagnosis Date  . Allergy   . Diabetes mellitus    Type 2 DM, Patient reports DM Type 1 diagnosd age at age 16  . Diabetic foot ulcers ([place])   . Foreign body in left foot    with  infection  . Hypertension   . Meniscus tear   . Multiple rib fractures   . Personal history of diabetic foot ulcer, Left Foot     Past Surgical History:  Procedure Laterality Date  . EYE SURGERY    . I&D EXTREMITY  04/23/2012   Procedure: IRRIGATION AND DEBRIDEMENT EXTREMITY;  Surgeon: Newt Minion, MD;  Location: [place];  Service: Orthopedics;  Laterality: Left;  Irrigation and Debridement Left Foot  . I&D EXTREMITY  09/18/2012   Procedure: IRRIGATION AND DEBRIDEMENT EXTREMITY;  Surgeon: Newt Minion, MD;  Location: [facility];  Service: Orthopedics;  Laterality: Left;  . MENISCUS REPAIR Left 04/2016   Orthopedist, Dr [name] in [place], [place]    There were no vitals filed for this visit.       Subjective Assessment - 07/02/16 1508    Subjective pt is a 50 y.o M with CC of L knee pain s/p L menisectomy on 03/11/2016. Since the surgery pt reports he is doing okay but still has difficulty putting lots of pressure on L, and heavy lifting.  conitnues to get intermittent swelling which will increase pain with prolong  standing/ walking.    Limitations Lifting;Walking;House hold activities;Standing;Other (comment)  stairs, hill   How long can you sit comfortably? 120 min   How long can you stand comfortably? 20 min   How long can you walk comfortably? 10-15 min   Diagnostic tests since the surgery   Patient Stated Goals get back into walking, increase endurance/ stamina, get muscle tone back, get back into performing again.    Currently in Pain? Yes   Pain Score 2    Pain Location Knee   Pain Orientation Left   Pain Descriptors / Indicators Throbbing;Aching   Pain Type Surgical pain   Pain Onset More than a month ago   Pain Frequency Intermittent   Aggravating Factors  prolonged standing/ walking, stairs   Pain Relieving Factors ibuprofen, ice            [facility] PT Assessment - 07/02/16 0001      Assessment   Medical Diagnosis S/P L menisectomy   Referring Provider Rodell Perna Md   Onset Date/Surgical Date 03/11/16   Hand Dominance Right   Next MD Visit make on following PT   Prior Therapy yes  L knee     Precautions   Precautions None     Restrictions   Weight Bearing Restrictions No     Balance  Screen   Has the patient fallen in the past 6 months Yes   How many times? 1   Has the patient had a decrease in activity level because of a fear of falling?  No   Is the patient reluctant to leave their home because of a fear of falling?  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 assessed     Observation/Other  Assessments   Focus on Therapeutic Outcomes (FOTO)  66% limited  predicted 42% limited     Posture/Postural Control   Posture/Postural Control Postural limitations     ROM / Strength   AROM / PROM / Strength AROM;PROM;Strength     AROM   AROM Assessment Site Knee   Right/Left Knee Right;Left   Right Knee Extension 0   Right Knee Flexion 138   Left Knee Extension -13   Left Knee Flexion 113     PROM   PROM Assessment Site Knee   Right/Left Knee Left;Right   Left Knee Extension -9   Left Knee Flexion 125     Strength   Strength Assessment Site Knee;Hip   Right/Left Hip Right;Left   Right Hip Flexion 4/5   Right Hip Extension 4/5   Right Hip ABduction 4-/5   Right Hip ADduction 4/5   Left Hip Flexion 3+/5   Left Hip Extension 3/5   Left Hip ABduction 3+/5   Left Hip ADduction 4/5   Right/Left Knee Right;Left   Right Knee Flexion 5/5   Right Knee Extension 5/5   Left Knee Flexion 3+/5  pain during testing   Left Knee Extension 3+/5  pain during testing     Palpation   Patella mobility hypomobility of the L patella compared bil in all planes with significant limitation with inferior / superior motion   Palpation comment soreness along the medial joint line and the medial gastroc head, semi-tendinosus/ bicep femoris                   [facility] Adult PT Treatment/Exercise - 07/02/16 0001      Knee/Hip Exercises: Stretches   Active Hamstring Stretch 3 reps;30 seconds  with strap   Gastroc Stretch 2 reps;30 seconds   Soleus Stretch 2 reps;30 seconds     Knee/Hip Exercises: Sidelying   Hip ABduction Left;2 sets;10 reps                PT Education - 07/02/16 1745    Education provided Yes   Education Details evaluation findings, POC, goals, HEP with proper form and treatment rationale, avoid using pillow beneath knee at night to assist with getting extension   Person(s) Educated Patient   Methods Explanation;Verbal cues;Handout;Demonstration    Comprehension Verbalized understanding;Verbal cues required;Returned demonstration          PT Short Term Goals - 07/02/16 1753      PT SHORT TERM GOAL #1   Title pt will be I with inital HEP (07/23/2016)   Baseline *  Time 3   Period Weeks   Status New     PT SHORT TERM GOAL #2   Title pt will increase L knee strength and bil hip strength to >/= 4-/5 with </= 4/10 pain during testing to promote functional strength for functional mobility (07/23/2016)   Time 3   Period Weeks   Status New     PT SHORT TERM GOAL #3   Title pt will be able to walk / stand for >/= 20 min with </= 4/10 pain to promote endurance (07/23/2016)   Time 3   Period Weeks   Status New           PT Long Term Goals - 07/02/16 1755      PT LONG TERM GOAL #1   Title pt will be I with all HEP given as of last visit (08/13/2016)   Baseline *   Time 6   Period Weeks   Status New     PT LONG TERM GOAL #2   Title pt will improve L knee and bil hip strength to >/= 4/5 strength with </= 2/10 pain for prolong walking / standing activities (08/13/2016)   Time 6   Period Weeks   Status New     PT LONG TERM GOAL #3   Title pt will increase L knee flexion to >/= 125 degrees and extension to </= -5 degrees with </= 2/10 pain for functional and efficent gait pattern (08/13/2016)   Baseline *   Time 6   Period Weeks   Status New     PT LONG TERM GOAL #4   Title he will be able to walk/ stand >/= 30 min and navigate up/down >/= 15 steps reciprocally with </= 1 HHA to assist with functional mobility and endurance required for potential work related activite and tasks (08/13/2016)   Time 6   Period Weeks   Status New     PT LONG TERM GOAL #5   Title pt will increase his FOTO score to </= 42% limitation to demontrate improvement in function at discharge (08/13/2016)   Time 6   Period Weeks   Status New               Plan - 07/02/16 1747    Clinical Impression Statement pt presents to [facility] as a low  complexity evaluation s/p L meniscetomy on 03/11/2016. he demonstrates limted L knee AROM/ PROM with pain/ tightness noted at end ranges with -13 degrees extension lag. weakness with testing of bil hips and L knee. currently exhibits antalgic gait pattern with trendelenburg gait pattern on the L with decreased stride and limited heel strike onthe L. soreness with tightness noted in L hamstrings and gastroc/ soleus. pt would benefit from physical therapy to decrease L knee pain, improve mobility, strength and endurance and overall maximize his function by addressing the deficits listed.    Rehab Potential Good   PT Frequency 2x / week   PT Duration 6 weeks   PT Treatment/Interventions ADLs/Self Care Home Management;Cryotherapy;Electrical Stimulation;Iontophoresis 4mg /ml Dexamethasone;Moist Heat;Therapeutic activities;Therapeutic exercise;Patient/family education;Passive range of motion;Dry needling;Taping;Manual techniques;Balance training;Gait training;Stair training   PT Next Visit Plan assess/ review HEP, hamstring stretching, strengthening of hips/ knee, modalites PRN,    PT Home Exercise Plan sit to stand, hamstring stretching, calf stretching (bent/ straight) sidelying hip abduction, quad set    Consulted and Agree with Plan of Care Patient      Patient will benefit from skilled therapeutic intervention in order to  improve the following deficits and impairments:  Pain, Improper body mechanics, Postural dysfunction, Decreased range of motion, Decreased endurance, Decreased activity tolerance, Hypomobility, Decreased strength, Decreased mobility, Difficulty walking, Increased fascial restricitons  Visit Diagnosis: Pain in left knee - Plan: PT plan of care cert/re-cert  Muscle weakness (generalized) - Plan: PT plan of care cert/re-cert  Stiffness of left knee, not elsewhere classified - Plan: PT plan of care cert/re-cert     Problem List Patient Active Problem List   Diagnosis Date Noted  .  Hyperlipidemia 05/28/2016  . Hypertriglyceridemia 05/28/2016  . Diabetic foot ulcers ([place])   . Left leg pain 02/27/2015  . Encounter for chronic pain management 01/30/2015  . Rib pain on left side 01/27/2015  . Injury resulting from fall from height 12/09/2014  . Adjustment disorder with anxiety 12/09/2014  . Broken rib 10/28/2014  . Pain in right hip 12/10/2012  . Erectile dysfunction 10/21/2012  . HTN (hypertension), benign 04/14/2012  . DM (diabetes mellitus), type 2, uncontrolled ([place]) 04/13/2012   Starr Lake PT, DPT, LAT, ATC  07/02/16  6:08 PM      [facility] [address] [place], [place], [address] Phone: [phone]   Fax:  [phone]  Name: Orby Tangen MRN: [id number] Date of Birth: April 03, 1966

## 2016-07-05 ENCOUNTER — Ambulatory Visit: Payer: Self-pay | Admitting: Family Medicine

## 2016-07-09 ENCOUNTER — Ambulatory Visit: Payer: Self-pay | Attending: Orthopaedic Surgery | Admitting: Physical Therapy

## 2016-07-09 DIAGNOSIS — M25662 Stiffness of left knee, not elsewhere classified: Secondary | ICD-10-CM | POA: Insufficient documentation

## 2016-07-09 DIAGNOSIS — M25562 Pain in left knee: Secondary | ICD-10-CM | POA: Insufficient documentation

## 2016-07-09 DIAGNOSIS — M6281 Muscle weakness (generalized): Secondary | ICD-10-CM | POA: Insufficient documentation

## 2016-07-09 DIAGNOSIS — G8929 Other chronic pain: Secondary | ICD-10-CM | POA: Insufficient documentation

## 2016-07-09 NOTE — Therapy (Signed)
[place], [place], [address] Phone: [phone]   Fax:  [phone]  Physical Therapy Treatment  Patient Details  Name: [name] MRN: [id number] Date of Birth: 18-Jan-1966 Referring Provider: Rodell Perna Md  Encounter Date: 07/09/2016      PT End of Session - 07/09/16 1529    Visit Number 2   Number of Visits 13   Date for PT Re-Evaluation 08/13/16   Authorization Type workers comp   PT Start Time 1500   PT Stop Time 1550   PT Time Calculation (min) 50 min   Activity Tolerance Patient tolerated treatment well   Behavior During Therapy [name] for tasks assessed/performed      Past Medical History:  Diagnosis Date  . Allergy   . Diabetes mellitus    Type 2 DM, Patient reports DM Type 1 diagnosd age at age 17  . Diabetic foot ulcers ([place])   . Foreign body in left foot    with  infection  . Hypertension   . Meniscus tear   . Multiple rib fractures   . Personal history of diabetic foot ulcer, Left Foot     Past Surgical History:  Procedure Laterality Date  . EYE SURGERY    . I&D EXTREMITY  04/23/2012   Procedure: IRRIGATION AND DEBRIDEMENT EXTREMITY;  Surgeon: Earl Minion, MD;  Location: [facility];  Service: Orthopedics;  Laterality: Left;  Irrigation and Debridement Left Foot  . I&D EXTREMITY  09/18/2012   Procedure: IRRIGATION AND DEBRIDEMENT EXTREMITY;  Surgeon: Earl Minion, MD;  Location: [facility];  Service: Orthopedics;  Laterality: Left;  . MENISCUS REPAIR Left 04/2016   Orthopedist, Dr [name] in [place], [place]    There were no vitals filed for this visit.      Subjective Assessment - 07/09/16 1457    Subjective "The exercises have been going well, I've been sore and have been trying to walk more"    Currently in Pain? Yes   Pain Score 3    Pain Location Knee   Pain Orientation Left   Pain Descriptors / Indicators Aching   Pain Type Chronic pain   Pain Frequency Intermittent   Aggravating Factors  standing/ walking/ stairs   Pain Relieving Factors ibuprofen, ice                         [facility] Adult PT Treatment/Exercise - 07/09/16 0001      Knee/Hip Exercises: Seated   Sit to Sand 2 sets;10 reps;with UE support     Knee/Hip Exercises: Supine   Bridges with Ball Squeeze Strengthening;Both;10 reps;1 set   Straight Leg Raises Right;Left;2 sets;10 reps  with quad set reset with each rep     Knee/Hip Exercises: Sidelying   Hip ABduction Left;2 sets;10 reps     Modalities   Modalities Moist Heat     Moist Heat Therapy   Number Minutes Moist Heat 10 Minutes   Moist Heat Location Knee  with knee on stretch                 PT Education - 07/09/16 1528    Education provided Yes   Education Details benefits of breath during exercise    Person(s) Educated Patient   Methods Explanation   Comprehension Verbalized understanding          PT Short Term Goals - 07/02/16 1753      PT SHORT TERM GOAL #1  Title pt will be I with inital HEP (07/23/2016)   Baseline *   Time 3   Period Weeks   Status New     PT SHORT TERM GOAL #2   Title pt will increase L knee strength and bil hip strength to >/= 4-/5 with </= 4/10 pain during testing to promote functional strength for functional mobility (07/23/2016)   Time 3   Period Weeks   Status New     PT SHORT TERM GOAL #3   Title pt will be able to walk / stand for >/= 20 min with </= 4/10 pain to promote endurance (07/23/2016)   Time 3   Period Weeks   Status New           PT Long Term Goals - 07/02/16 1755      PT LONG TERM GOAL #1   Title pt will be I with all HEP given as of last visit (08/13/2016)   Baseline *   Time 6   Period Weeks   Status New     PT LONG TERM GOAL #2   Title pt will improve L knee and bil hip strength to >/= 4/5 strength with </= 2/10 pain for prolong walking / standing activities (08/13/2016)   Time 6   Period Weeks   Status New     PT LONG  TERM GOAL #3   Title pt will increase L knee flexion to >/= 125 degrees and extension to </= -5 degrees with </= 2/10 pain for functional and efficent gait pattern (08/13/2016)   Baseline *   Time 6   Period Weeks   Status New     PT LONG TERM GOAL #4   Title he will be able to walk/ stand >/= 30 min and navigate up/down >/= 15 steps reciprocally with </= 1 HHA to assist with functional mobility and endurance required for potential work related activite and tasks (08/13/2016)   Time 6   Period Weeks   Status New     PT LONG TERM GOAL #5   Title pt will increase his FOTO score to </= 42% limitation to demontrate improvement in function at discharge (08/13/2016)   Time 6   Period Weeks   Status New               Plan - 07/09/16 1546    Clinical Impression Statement [name] reports he has been consistent with his HEP, and has been practicing sleeping with a smaller pillow beneath his knee despite being told not to use one. worked on hip/ knee strengthening with proper mechanics for sit to stand activites. utilized MHP post session to calm down pain and tightness with pt on stretch.    PT Next Visit Plan ,hamstring stretching, strengthening of hips/ knee, modalites PRN,    Consulted and Agree with Plan of Care Patient      Patient will benefit from skilled therapeutic intervention in order to improve the following deficits and impairments:  Pain, Improper body mechanics, Postural dysfunction, Decreased range of motion, Decreased endurance, Decreased activity tolerance, Hypomobility, Decreased strength, Decreased mobility, Difficulty walking, Increased fascial restricitons  Visit Diagnosis: Chronic pain of left knee  Muscle weakness (generalized)  Stiffness of left knee, not elsewhere classified     Problem List Patient Active Problem List   Diagnosis Date Noted  . Hyperlipidemia 05/28/2016  . Hypertriglyceridemia 05/28/2016  . Diabetic foot ulcers ([place])   . Left leg  pain 02/27/2015  . Encounter for  chronic pain management 01/30/2015  . Rib pain on left side 01/27/2015  . Injury resulting from fall from height 12/09/2014  . Adjustment disorder with anxiety 12/09/2014  . Broken rib 10/28/2014  . Pain in right hip 12/10/2012  . Erectile dysfunction 10/21/2012  . HTN (hypertension), benign 04/14/2012  . DM (diabetes mellitus), type 2, uncontrolled ([place]) 04/13/2012   Starr Lake PT, DPT, LAT, ATC  07/09/16  3:50 PM      [facility] [address] [place], [place], [address] Phone: [phone]   Fax:  [phone]  Name: [name] MRN: [id number] Date of Birth: 01/06/1966

## 2016-07-11 ENCOUNTER — Ambulatory Visit: Payer: Self-pay | Admitting: Physical Therapy

## 2016-07-15 ENCOUNTER — Encounter (HOSPITAL_COMMUNITY): Payer: Self-pay | Admitting: Emergency Medicine

## 2016-07-15 ENCOUNTER — Emergency Department (HOSPITAL_COMMUNITY): Payer: Self-pay

## 2016-07-15 ENCOUNTER — Inpatient Hospital Stay (HOSPITAL_COMMUNITY)
Admission: EM | Admit: 2016-07-15 | Discharge: 2016-07-18 | DRG: 872 | Disposition: A | Discharge status: Home / Self Care | Payer: Self-pay | Attending: Family Medicine | Admitting: Family Medicine

## 2016-07-15 DIAGNOSIS — Z79899 Other long term (current) drug therapy: Secondary | ICD-10-CM

## 2016-07-15 DIAGNOSIS — L97529 Non-pressure chronic ulcer of other part of left foot with unspecified severity: Secondary | ICD-10-CM | POA: Diagnosis present

## 2016-07-15 DIAGNOSIS — Z9889 Other specified postprocedural states: Secondary | ICD-10-CM

## 2016-07-15 DIAGNOSIS — L0390 Cellulitis, unspecified: Secondary | ICD-10-CM | POA: Diagnosis present

## 2016-07-15 DIAGNOSIS — L03116 Cellulitis of left lower limb: Secondary | ICD-10-CM | POA: Diagnosis present

## 2016-07-15 DIAGNOSIS — L97509 Non-pressure chronic ulcer of other part of unspecified foot with unspecified severity: Secondary | ICD-10-CM

## 2016-07-15 DIAGNOSIS — Z8639 Personal history of other endocrine, nutritional and metabolic disease: Secondary | ICD-10-CM | POA: Diagnosis present

## 2016-07-15 DIAGNOSIS — Z8249 Family history of ischemic heart disease and other diseases of the circulatory system: Secondary | ICD-10-CM

## 2016-07-15 DIAGNOSIS — I10 Essential (primary) hypertension: Secondary | ICD-10-CM | POA: Diagnosis present

## 2016-07-15 DIAGNOSIS — A419 Sepsis, unspecified organism: Principal | ICD-10-CM | POA: Diagnosis present

## 2016-07-15 DIAGNOSIS — E785 Hyperlipidemia, unspecified: Secondary | ICD-10-CM | POA: Diagnosis present

## 2016-07-15 DIAGNOSIS — E1140 Type 2 diabetes mellitus with diabetic neuropathy, unspecified: Secondary | ICD-10-CM | POA: Diagnosis present

## 2016-07-15 DIAGNOSIS — Z87891 Personal history of nicotine dependence: Secondary | ICD-10-CM

## 2016-07-15 DIAGNOSIS — Z833 Family history of diabetes mellitus: Secondary | ICD-10-CM

## 2016-07-15 DIAGNOSIS — E11621 Type 2 diabetes mellitus with foot ulcer: Secondary | ICD-10-CM | POA: Diagnosis present

## 2016-07-15 DIAGNOSIS — Z8261 Family history of arthritis: Secondary | ICD-10-CM

## 2016-07-15 DIAGNOSIS — Z7984 Long term (current) use of oral hypoglycemic drugs: Secondary | ICD-10-CM

## 2016-07-15 DIAGNOSIS — E1165 Type 2 diabetes mellitus with hyperglycemia: Secondary | ICD-10-CM | POA: Diagnosis present

## 2016-07-15 DIAGNOSIS — E7800 Pure hypercholesterolemia, unspecified: Secondary | ICD-10-CM | POA: Diagnosis present

## 2016-07-15 DIAGNOSIS — Z91040 Latex allergy status: Secondary | ICD-10-CM

## 2016-07-15 DIAGNOSIS — E11622 Type 2 diabetes mellitus with other skin ulcer: Secondary | ICD-10-CM | POA: Diagnosis present

## 2016-07-15 DIAGNOSIS — IMO0002 Reserved for concepts with insufficient information to code with codable children: Secondary | ICD-10-CM | POA: Diagnosis present

## 2016-07-15 LAB — COMPREHENSIVE METABOLIC PANEL
ALK PHOS: 78 U/L (ref 38–126)
ALT: 18 U/L (ref 17–63)
ANION GAP: 8 (ref 5–15)
AST: 18 U/L (ref 15–41)
Albumin: 3 g/dL — ABNORMAL LOW (ref 3.5–5.0)
BILIRUBIN TOTAL: 1 mg/dL (ref 0.3–1.2)
BUN: 12 mg/dL (ref 6–20)
CALCIUM: 8.6 mg/dL — AB (ref 8.9–10.3)
CO2: 24 mmol/L (ref 22–32)
Chloride: 99 mmol/L — ABNORMAL LOW (ref 101–111)
Creatinine, Ser: 1.14 mg/dL (ref 0.61–1.24)
GFR calc non Af Amer: >60 mL/min (ref >60)
Glucose, Bld: 348 mg/dL — ABNORMAL HIGH (ref 65–99)
POTASSIUM: 3.7 mmol/L (ref 3.5–5.1)
SODIUM: 131 mmol/L — AB (ref 135–145)
TOTAL PROTEIN: 6.6 g/dL (ref 6.5–8.1)

## 2016-07-15 LAB — I-STAT CG4 LACTIC ACID, ED
LACTIC ACID, VENOUS: 2.79 mmol/L — AB (ref 0.5–1.9)
Lactic Acid, Venous: 1.36 mmol/L (ref 0.5–1.9)

## 2016-07-15 LAB — URINE MICROSCOPIC-ADD ON

## 2016-07-15 LAB — CBC WITH DIFFERENTIAL/PLATELET
BASOS PCT: 0 %
Basophils Absolute: 0 10*3/uL (ref 0.0–0.1)
EOS ABS: 0.1 10*3/uL (ref 0.0–0.7)
EOS PCT: 1 %
HCT: 39.8 % (ref 39.0–52.0)
Hemoglobin: 14.4 g/dL (ref 13.0–17.0)
Lymphocytes Relative: 8 %
Lymphs Abs: 0.9 10*3/uL (ref 0.7–4.0)
MCH: 32.6 pg (ref 26.0–34.0)
MCHC: 36.2 g/dL — ABNORMAL HIGH (ref 30.0–36.0)
MCV: 90 fL (ref 78.0–100.0)
MONO ABS: 0.8 10*3/uL (ref 0.1–1.0)
MONOS PCT: 7 %
Neutro Abs: 9.3 10*3/uL — ABNORMAL HIGH (ref 1.7–7.7)
Neutrophils Relative %: 84 %
Platelets: 159 10*3/uL (ref 150–400)
RBC: 4.42 MIL/uL (ref 4.22–5.81)
RDW: 12.4 % (ref 11.5–15.5)
WBC: 11.1 10*3/uL — ABNORMAL HIGH (ref 4.0–10.5)

## 2016-07-15 LAB — URINALYSIS, ROUTINE W REFLEX MICROSCOPIC
Bilirubin Urine: NEGATIVE
Glucose, UA: >1000 mg/dL — AB
Ketones, ur: NEGATIVE mg/dL
Leukocytes, UA: NEGATIVE
Nitrite: NEGATIVE
Protein, ur: >300 mg/dL — AB
SPECIFIC GRAVITY, URINE: 1.03 (ref 1.005–1.030)
pH: 6.5 (ref 5.0–8.0)

## 2016-07-15 LAB — I-STAT BETA HCG BLOOD, ED (MC, WL, AP ONLY): I-stat hCG, quantitative: <5 m[IU]/mL (ref <5)

## 2016-07-15 MED ORDER — ACETAMINOPHEN 325 MG PO TABS
650.0000 mg | ORAL_TABLET | Freq: Once | ORAL | Status: AC
  Administered 2016-07-15: 650 mg via ORAL

## 2016-07-15 MED ORDER — SODIUM CHLORIDE 0.9 % IV BOLUS (SEPSIS)
2000.0000 mL | Freq: Once | INTRAVENOUS | Status: AC
  Administered 2016-07-15: 2000 mL via INTRAVENOUS

## 2016-07-15 MED ORDER — PIPERACILLIN-TAZOBACTAM 3.375 G IVPB 30 MIN
3.3750 g | Freq: Once | INTRAVENOUS | Status: AC
  Administered 2016-07-15: 3.375 g via INTRAVENOUS
  Filled 2016-07-15: qty 50

## 2016-07-15 MED ORDER — ACETAMINOPHEN 325 MG PO TABS
ORAL_TABLET | ORAL | Status: AC
  Filled 2016-07-15: qty 2

## 2016-07-15 MED ORDER — VANCOMYCIN HCL IN DEXTROSE 1-5 GM/200ML-% IV SOLN
1000.0000 mg | Freq: Once | INTRAVENOUS | Status: AC
  Administered 2016-07-15: 1000 mg via INTRAVENOUS
  Filled 2016-07-15: qty 200

## 2016-07-15 NOTE — ED Provider Notes (Signed)
[facility] DEPT Provider Note   CSN: [id number] Arrival date & time: 07/15/16  2120   By signing my name below, I, [name], attest that this documentation has been prepared under the direction and in the presence of Varney Biles, MD  Electronically Signed: [name], ED Scribe. 07/15/16. 11:24 PM.                              History   Chief Complaint Chief Complaint  Patient presents with  . Fever  . Foot Pain    possible cellulitis                                The history is provided by the patient. No language interpreter was used.                   HPI Comments:  [name] is a 50 y.o. male, with a hx of DM, who presents to the Emergency Department complaining of "burning" left foot which worsened 1 day ago. He notes the pain radiates up to his L thigh. Pt notes he has a draining open sore to his left foot. He states he first noticed the sore several months ago, visited a PCP in 07/7 and was prescribed antibiotics which he has since finished. He notes associated diaphoresis, chills, fever with a TMAX 100.8, body aches, coughing, vomiting and redness to his left lower extremity  x 3 days. He has taken ibuprofen with no relief. Pt denies any other complaints at this time.   Past Medical History:  Diagnosis Date  . Allergy   . Diabetes mellitus    Type 2 DM, Patient reports DM Type 1 diagnosd age at age 38  . Diabetic foot ulcers ([place])   . Foreign body in left foot    with  infection  . Hypertension   . Meniscus tear   . Multiple rib fractures   . Personal history of diabetic foot ulcer, Left Foot     Patient Active Problem List   Diagnosis Date Noted  . Cellulitis 07/15/2016  . Hyperlipidemia 05/28/2016  . Hypertriglyceridemia 05/28/2016  . Diabetic foot ulcers ([place])   . Left leg pain 02/27/2015  . Encounter for chronic pain management 01/30/2015  . Rib pain on left side 01/27/2015  . Injury resulting from fall from height 12/09/2014  . Adjustment  disorder with anxiety 12/09/2014  . Broken rib 10/28/2014  . Pain in right hip 12/10/2012  . Erectile dysfunction 10/21/2012  . HTN (hypertension), benign 04/14/2012  . DM (diabetes mellitus), type 2, uncontrolled ([place]) 04/13/2012    Past Surgical History:  Procedure Laterality Date  . EYE SURGERY    . I&D EXTREMITY  04/23/2012   Procedure: IRRIGATION AND DEBRIDEMENT EXTREMITY;  Surgeon: Newt Minion, MD;  Location: [facility];  Service: Orthopedics;  Laterality: Left;  Irrigation and Debridement Left Foot  . I&D EXTREMITY  09/18/2012   Procedure: IRRIGATION AND DEBRIDEMENT EXTREMITY;  Surgeon: Newt Minion, MD;  Location: [facility];  Service: Orthopedics;  Laterality: Left;  . MENISCUS REPAIR Left 04/2016   Orthopedist, Dr [name] in [place], [place]     Home Medications    Prior to Admission medications   Medication Sig Start Date End Date Taking? Authorizing Provider  ibuprofen (ADVIL,MOTRIN) 200 MG tablet Take 400 mg by mouth every 6 (six) hours as needed for  fever or moderate pain.   Yes Historical Provider, MD  insulin NPH Human (HUMULIN N,NOVOLIN N) 100 UNIT/ML injection Inject 4-12 Units into the skin 2 (two) times daily before a meal.   Yes Historical Provider, MD  metFORMIN (GLUCOPHAGE) 500 MG tablet Take 1 tablet (500 mg total) by mouth 2 (two) times daily with a meal. 01/30/15  Yes [name], DO  atorvastatin (LIPITOR) 40 MG tablet Take 1 tablet (40 mg total) by mouth daily. Patient not taking: Reported on 07/15/2016 05/28/16   [name], MD  lisinopril (PRINIVIL,ZESTRIL) 20 MG tablet Take 1 tablet (20 mg total) by mouth daily. Patient not taking: Reported on 07/15/2016 01/30/15   [name], DO  sulfamethoxazole-trimethoprim (BACTRIM DS,SEPTRA DS) 800-160 MG tablet Take 1 tablet by mouth 2 (two) times daily. Patient not taking: Reported on 07/15/2016 05/22/16   [name], MD    Family History Family History  Problem Relation Age of Onset  . Diabetes type  II Mother   . Arthritis Mother   . Diabetes Mother   . Hyperlipidemia Mother   . Hypertension Mother   . Diabetes type II Sister   . Diabetes Father     Social History Social History  Substance Use Topics  . Smoking status: Former Smoker    Types: Cigarettes    Quit date: 09/03/2012  . Smokeless tobacco: Former Systems developer    Quit date: 09/03/2012  . Alcohol use 4.8 oz/week    6 Cans of beer, 2 Shots of liquor per week     Comment: weekends.     Allergies   Latex   Review of Systems Review of Systems  Constitutional: Positive for chills, diaphoresis and fever.  Respiratory: Positive for cough.   Gastrointestinal: Positive for vomiting.  Skin: Positive for color change (redness) and wound.   10 Systems reviewed and are negative for acute change except as noted in the HPI.  Physical Exam Updated Vital Signs BP 161/83   Pulse 89   Temp 100.8 F (38.2 C) (Oral)   Resp 20   Ht 5\' 11"  (1.803 m)   Wt 198 lb (89.8 kg)   SpO2 100%   BMI 27.62 kg/m   Physical Exam  Constitutional: He is oriented to person, place, and time. He appears well-developed and well-nourished.  HENT:  Head: Normocephalic.  Eyes: EOM are normal.  Neck: Normal range of motion.  No meningismus.   Cardiovascular: Regular rhythm.  Tachycardia present.   No murmur heard. Pulmonary/Chest: Effort normal and breath sounds normal.  Abdominal: Soft. He exhibits no distension.  Musculoskeletal: Normal range of motion. He exhibits tenderness.  Neurological: He is alert and oriented to person, place, and time.  Cranial nerves 2-12 intact.   Skin: Skin is warm.  RLE erythema over leg with TTP.  Skin warm to touch in LLE. 3x3 callus on plantar surface under the metatarsal bone. No active drainage. No significant erythema or calor. TTP. 2+ DP in LLE   Psychiatric: He has a normal mood and affect.  Nursing note and vitals reviewed.    ED Treatments / Results  DIAGNOSTIC STUDIES:  Oxygen Saturation is  99% on RA, normal by my interpretation.    COORDINATION OF CARE:  11:14 PM Discussed treatment plan with pt at bedside and pt agreed to plan.  Labs (all labs ordered are listed, but only abnormal results are displayed) Labs Reviewed  COMPREHENSIVE METABOLIC PANEL - Abnormal; Notable for the following:  Result Value   Sodium 131 (*)    Chloride 99 (*)    Glucose, Bld 348 (*)    Calcium 8.6 (*)    Albumin 3.0 (*)    All other components within normal limits  CBC WITH DIFFERENTIAL/PLATELET - Abnormal; Notable for the following:    WBC 11.1 (*)    MCHC 36.2 (*)    Neutro Abs 9.3 (*)    All other components within normal limits  URINALYSIS, ROUTINE W REFLEX MICROSCOPIC (NOT AT [facility]) - Abnormal; Notable for the following:    Color, Urine AMBER (*)    APPearance CLOUDY (*)    Glucose, UA >1000 (*)    Hgb urine dipstick MODERATE (*)    Protein, ur >300 (*)    All other components within normal limits  URINE MICROSCOPIC-ADD ON - Abnormal; Notable for the following:    Squamous Epithelial / LPF 6-30 (*)    Bacteria, UA FEW (*)    Casts GRANULAR CAST (*)    All other components within normal limits  I-STAT CG4 LACTIC ACID, ED - Abnormal; Notable for the following:    Lactic Acid, Venous 2.79 (*)    All other components within normal limits  CULTURE, BLOOD (ROUTINE X 2)  CULTURE, BLOOD (ROUTINE X 2)  URINE CULTURE  SEDIMENTATION RATE  C-REACTIVE PROTEIN  I-STAT BETA HCG BLOOD, ED ([facility], [facility], [facility] ONLY)  I-STAT CG4 LACTIC ACID, ED    EKG  EKG Interpretation None       Radiology Dg Chest 2 View  Result Date: 07/15/2016 CLINICAL DATA:  Sharp pain under right breast with productive cough, chills and fever EXAM: CHEST  2 VIEW COMPARISON:  06/29/2015 FINDINGS: The heart size and mediastinal contours are within normal limits. Both lungs are clear. The visualized skeletal structures are unremarkable. IMPRESSION: No active cardiopulmonary disease. Electronically Signed   By: Donavan Foil M.D.   On: 07/15/2016 22:45   Dg Foot 2 Views Left  Result Date: 07/15/2016 CLINICAL DATA:  Left foot pain radiating to the thenar, open sore on sole of the foot at the distal first metatarsal for over 1 month EXAM: LEFT FOOT - 2 VIEW COMPARISON:  05/22/2016 FINDINGS: No acute displaced fracture or malalignment. Vascular calcifications are present. No soft tissue gas. No osteopenia or bone destruction is evident. Mild degenerative changes at the first MTP joint. IMPRESSION: No acute osseous abnormality Electronically Signed   By: Donavan Foil M.D.   On: 07/15/2016 22:47    Procedures Procedures (including critical care time)  Medications Ordered in ED Medications  sodium chloride 0.9 % bolus 2,000 mL (not administered)  piperacillin-tazobactam (ZOSYN) IVPB 3.375 g (not administered)  vancomycin (VANCOCIN) IVPB 1000 mg/200 mL premix (not administered)  acetaminophen (TYLENOL) tablet 650 mg (650 mg Oral Given 07/15/16 2134)     Initial Impression / Assessment and Plan / ED Course  I have reviewed the triage vital signs and the nursing notes.  Pertinent labs & imaging results that were available during my care of the patient were reviewed by me and considered in my medical decision making (see chart for details).  Clinical Course   I personally performed the services described in this documentation, which was scribed in my presence. The recorded information has been reviewed and is accurate.  Pt comes in with cc of fevers, malaise, chills. He also has LLE unilateral pain. Exam reveals cellulitis/erysipelas type findings over the L leg.  In addition, differential diagnosis also includes osteomyelitis of  the foot and infected diabetic wound - he will likely need debridement.   Final Clinical Impressions(s) / ED Diagnoses   Final diagnoses:  Cellulitis of left lower extremity    New Prescriptions New Prescriptions   No medications on file     Varney Biles,  MD 07/15/16 [phone]

## 2016-07-15 NOTE — ED Triage Notes (Addendum)
Pt reports pain to left foot from unhealed wound ongoing for several months, seen in July for same and given antibiotics. States in the last couple of days he has had a fever, sweating, shivering. States highest he saw fever was 100.9. States taking aspirin for pain but not effective. Left leg hot to the touch. Hx DM.

## 2016-07-15 NOTE — H&P (Signed)
[facility] Admission History and Physical Service Pager: [phone]  Patient name: [name] Medical record number: [id number] Date of birth: 1966-09-23 Age: 51 y.o. Gender: male  Primary Care Provider: [name], MD Consultants: None  Code Status: Full  Chief Complaint: Left leg pain  Assessment and Plan: [name] is a 50 y.o. male with a past medical history significant for Diabetes type II, Hypertension, right knee meniscus tear s/p repair, multiple rib fractures who presented left leg erythema and pain consistent with cellulitis.  #Leg cellulitis, acute, moderate to severe, nonpurulent Patient reports severe left leg pain for the past 3 days and started to develop erythema in his left foot and lower leg on Sunday. Patient had a boil on the plantar surface of his left foot that was lanced in July and has been draining serosanguineous and purulent non odorous fluid for the past 2 months. Patient completed antibiotic course (Bactrim) and has been taking care of wound since July. Patient is diabetic which would explaine slow healing process. Fever and chills accompanied erythema noted on his left lower leg after a callus formed and sealed draining wound about a week ago. On admission patient had a WBC count 11.1, febrile to 100.69F and tachycardic. On initial presentation, patient meets SIRS criteria, but was hemodynamically stable and did not appear toxic. Sepsis protocol initiated due to SIRS criteria and elevated lactic acid to 2.79. Qsofa score of 0 at admission. Clinical presentation consistent with left leg cellulitis secondary to a poorly healed and infected plantar surface wound. Patient left leg was swollen compare to right leg, patient had a negative Homan's sign on exam, however could not rule out PE with exquisite pain when left calf was palpated.  --Admit to FMTS, admitting physician Dr. Billee Cashing Diarmid --Start patient on Vancomycin 1g and Zosyn 3.375 g,  reassess in the am --F/u on Blood and Urine Cultures --Follow up on doppler US to rule out DVT  --CRP and Sed Rate --MRI of the Left foot and leg to r/o osteo and possible abscess given elevated CRP to 13.6 and ESR of 47 and concern for fluctuance underneath foot ulcer  --Continue IVF with additional 1L bolus to complete 30 cc/kg bolus protocol  --Oxycodone 5 mg q4 prn for moderate pain --Tylenol 650 mg q6 for mild pain --Zofran 4 mg q6 prn --Monitor vitals --Consider consulting Ortho if bone involved -PT consult   #Sepsis, acute, resolving Patient initally presented with fever 100.8, tachycardia, chills and WBC 11.1 with a slight left shit in the setting of left leg cellulitis. Lactic acid was 2.78. Patient was bolus and started on IVF, and antibiotics. Repeat vitals were within normal limit. Patient clinical stable and did not appear toxic. UA was unremarkable and showed sign of dehydration consistent with decrease po intake in the past 3 days and multiple episodes of emesis. -- Will continue IVF and bolus as needed --Trend lactic acid  --Continue Vanc, Zosyn   #DM type II, chronic Patient last A1c 9.6 (05/23/2016) down from 10.8 (01/30/2015). Will continue current regimen and will try to optimize regimen prior to discharge. UA with >1000 glucose and CBG 348 at admission.  --Continue NPH 12U in the morning and 4U at night; patient seemed unclear of current insulin doses and two different doses were listed in home medications; opted to order lower dose reported, adjust prn  --Start patient on SSI --CBG monitoring  #Hyperlipidemia, chronic Patient with high cholesterol 260 and very elevated tryglycerides 460. Will  continue current treatment regimen --Continue atorvastatin 40 mg once daily  -patient was not taking statin prior to admission, discuss benefits of statin therapy with patient   #Hypertension, chronic Patient blood pressure on admission 126/86 in the setting of pain and SIRS.  Patient has not been taking BP meds.  -- Will hold lisinopril 20 mg; will likely need anti-hypertensive regimen at discharge   FEN/GI: Heart healthy, Carb modified, NS 125 cc/hour Prophylaxis: Lovenox  Disposition: Admit to telemetry for further management of left leg cellulitis  History of Present Illness:  [name] is a 50 y.o. male with a past medical history significant for Diabetes type II, Hypertension, Right knee meniscus tear s/p repair, multiple rib fractures who presented with left leg pain, fever and chills. Patient states that for the past 3 days he has been experiencing left leg pain which started on the plantar surface of his left foot. Patient had a boil at the base of his first metatarsal on his left foot that was lanced in July at the [facility] and subsequently treated with antibiotics. Patient has been keeping wound clean and caring for it as instructed. However, patient reports continued drainage of serosanguinous and purulent fluid from the wound for the past 2 months. Patient has been protecting wound with some padding while wearing shoes to avoid reinjury. About week ago, patient reports formation of a callus with decrease drainage from wound. Since Saturday, patient has been experiencing severe lancinating pain starting on his left foot and radiating up his leg. Patient has been also experiencing some low grade fever highest measured 100.23F), chills and diaphoresis. Patient has been unable to bear any weight on leg because of pain. Patient reports some nausea and NBNB emesis. Patient have noted some redness on his foot around the callus, however redness has spread to the anterior lower leg on Sunday and has been rapidly spreading on today (monday). Patient have denied shortness of breath, chest pain, headaches and diarrhea.  In the ED, patient was started on vancomycin and Zosyn. Patient also received Toradol for pain control and Reglan for nausea. Blood and urine  cultures were sent and chest x-ray and left foot x-ray were also obtained.  Review Of Systems: Per HPI with the following additions:  Review of Systems  Constitutional: Positive for chills, fever and malaise/fatigue.  HENT: Negative.   Eyes: Negative.   Respiratory: Negative.   Cardiovascular: Negative.   Gastrointestinal: Negative.   Genitourinary: Negative.   Musculoskeletal:       Left leg pain   Skin:       Left leg erythema bellow knee  Neurological: Positive for weakness.  Endo/Heme/Allergies: Negative.   Psychiatric/Behavioral: Negative.     Patient Active Problem List   Diagnosis Date Noted  . Cellulitis 07/15/2016  . Hyperlipidemia 05/28/2016  . Hypertriglyceridemia 05/28/2016  . Diabetic foot ulcers ([place])   . Left leg pain 02/27/2015  . Encounter for chronic pain management 01/30/2015  . Rib pain on left side 01/27/2015  . Injury resulting from fall from height 12/09/2014  . Adjustment disorder with anxiety 12/09/2014  . Broken rib 10/28/2014  . Pain in right hip 12/10/2012  . Erectile dysfunction 10/21/2012  . HTN (hypertension), benign 04/14/2012  . DM (diabetes mellitus), type 2, uncontrolled ([place]) 04/13/2012    Past Medical History: Past Medical History:  Diagnosis Date  . Allergy   . Diabetes mellitus    Type 2 DM, Patient reports DM Type 1 diagnosd age at age 70  .  Diabetic foot ulcers ([place])   . Foreign body in left foot    with  infection  . Hypertension   . Meniscus tear   . Multiple rib fractures   . Personal history of diabetic foot ulcer, Left Foot     Past Surgical History: Past Surgical History:  Procedure Laterality Date  . EYE SURGERY    . I&D EXTREMITY  04/23/2012   Procedure: IRRIGATION AND DEBRIDEMENT EXTREMITY;  Surgeon: Newt Minion, MD;  Location: [facility];  Service: Orthopedics;  Laterality: Left;  Irrigation and Debridement Left Foot  . I&D EXTREMITY  09/18/2012   Procedure: IRRIGATION AND DEBRIDEMENT EXTREMITY;  Surgeon:  Newt Minion, MD;  Location: [facility];  Service: Orthopedics;  Laterality: Left;  . MENISCUS REPAIR Left 04/2016   Orthopedist, Dr [name] in [place], [place]    Social History: Social History  Substance Use Topics  . Smoking status: Former Smoker    Types: Cigarettes    Quit date: 09/03/2012  . Smokeless tobacco: Former Systems developer    Quit date: 09/03/2012  . Alcohol use 4.8 oz/week    6 Cans of beer, 2 Shots of liquor per week     Comment: weekends.   Additional social history: Drinks 6 pack over most weekends. Not currently smoking.   Please also refer to relevant sections of EMR.  Family History: Family History  Problem Relation Age of Onset  . Diabetes type II Mother   . Arthritis Mother   . Diabetes Mother   . Hyperlipidemia Mother   . Hypertension Mother   . Diabetes type II Sister   . Diabetes Father    Allergies and Medications: Allergies  Allergen Reactions  . Latex Rash    Pt states he is allergic to latex condoms   Current Facility-Administered Medications on File Prior to Encounter  Medication Dose Route Frequency Provider Last Rate Last Dose  . insulin NPH Human (HUMULIN N,NOVOLIN N) injection 14 Units  14 Units Subcutaneous Once Todd D McDiarmid, MD      . insulin NPH Human (HUMULIN N,NOVOLIN N) injection 6 Units  6 Units Subcutaneous QPM Blane Ohara McDiarmid, MD       Current Outpatient Prescriptions on File Prior to Encounter  Medication Sig Dispense Refill  . metFORMIN (GLUCOPHAGE) 500 MG tablet Take 1 tablet (500 mg total) by mouth 2 (two) times daily with a meal. 60 tablet 2  . atorvastatin (LIPITOR) 40 MG tablet Take 1 tablet (40 mg total) by mouth daily. (Patient not taking: Reported on 07/15/2016) 30 tablet 3  . lisinopril (PRINIVIL,ZESTRIL) 20 MG tablet Take 1 tablet (20 mg total) by mouth daily. (Patient not taking: Reported on 07/15/2016) 30 tablet 3  . sulfamethoxazole-trimethoprim (BACTRIM DS,SEPTRA DS) 800-160 MG tablet Take 1 tablet by mouth 2 (two) times  daily. (Patient not taking: Reported on 07/15/2016) 20 tablet 0    Objective: BP 150/82   Pulse 91   Temp 100.8 F (38.2 C) (Oral)   Resp 20   Ht _0  (1.803 m)   Wt 198 lb (89.8 kg)   SpO2 100%   BMI 27.62 kg/m            Physical Exam: General Appearance:  Pleasant gentleman, in mild discomfort, but  cooperative and able to answer questions and participate in exam. Head/face:  NCAT Eyes:  PERRL and EOMI Mouth/Throat:  Mucosa moist, no lesions; pharynx without erythema, edema or exudate. Neck:  Supple, no mass, non-tender, no bruits, no jvd, Adenopathy-  absent Lungs:  Normal expansion.  Clear to auscultation.  No rales, rhonchi, or wheezing. Heart:   Normal S1 and S2.  Regular rate and rhythm without murmur, gallop or rub. Abdomen:  Soft, non-tender, normal bowel sounds; no bruits, organomegaly or masses. Skin: Left lower leg erythema below knee and above ankle in the anterior aspect of the leg, warm to the touch, non-purulent. Plantar surface ulcer with overlying callous without drainage but fluctuance appreciated underneath. Callus is TTP and has some surrounding mild erythema.  Extremities: Extremities warm to touch, pink, with no edema. and pulses present in all extremities  Musculoskeletal:  No Homen sign, but decrease range of motion in left leg secondary to pain. Left left mildly swollen in comparison to right leg. No pain in shin with passive flexion of left big toe.  Neurologic:  Alert and oriented x 3,  reflexes normal and symmetric, strength and  sensation grossly normal, cranial nerves II-XII intact   Labs and Imaging: CBC BMET   Recent Labs Lab 07/15/16 2142  WBC 11.1*  HGB 14.4  HCT 39.8  PLT 159  CRP 13.6 (H) Sed rate: 47 (H) Lactic acid: 2.79   Recent Labs Lab 07/15/16 2142  NA 131*  K 3.7  CL 99*  CO2 24  BUN 12  CREATININE 1.14  GLUCOSE 348*  CALCIUM 8.6*     Dg Chest 2 View  Result Date: 07/15/2016 CLINICAL DATA:  Sharp pain  under right breast with productive cough, chills and fever EXAM: CHEST  2 VIEW COMPARISON:  06/29/2015 FINDINGS: The heart size and mediastinal contours are within normal limits. Both lungs are clear. The visualized skeletal structures are unremarkable. IMPRESSION: No active cardiopulmonary disease. Electronically Signed   By: Donavan Foil M.D.   On: 07/15/2016 22:45   Dg Foot 2 Views Left  Result Date: 07/15/2016 CLINICAL DATA:  Left foot pain radiating to the thenar, open sore on sole of the foot at the distal first metatarsal for over 1 month EXAM: LEFT FOOT - 2 VIEW COMPARISON:  05/22/2016 FINDINGS: No acute displaced fracture or malalignment. Vascular calcifications are present. No soft tissue gas. No osteopenia or bone destruction is evident. Mild degenerative changes at the first MTP joint. IMPRESSION: No acute osseous abnormality Electronically Signed   By: Donavan Foil M.D.   On: 07/15/2016 22:47   Marjie Skiff, MD 07/15/2016, 11:38 PM PGY-1, [place] Intern pager: [phone], text pages welcome  Upper Level Addendum:  I have seen and evaluated this patient along with Dr. Andy Gauss and reviewed the above note, making necessary revisions in green.   Phill Myron, D.O. 07/16/2016, 8:13 AM PGY-2, [place]

## 2016-07-16 ENCOUNTER — Observation Stay (HOSPITAL_BASED_OUTPATIENT_CLINIC_OR_DEPARTMENT_OTHER): Payer: Self-pay

## 2016-07-16 ENCOUNTER — Ambulatory Visit: Payer: Self-pay | Admitting: Physical Therapy

## 2016-07-16 ENCOUNTER — Observation Stay (HOSPITAL_COMMUNITY): Payer: Self-pay

## 2016-07-16 DIAGNOSIS — M79609 Pain in unspecified limb: Secondary | ICD-10-CM

## 2016-07-16 DIAGNOSIS — E11621 Type 2 diabetes mellitus with foot ulcer: Secondary | ICD-10-CM

## 2016-07-16 DIAGNOSIS — L03032 Cellulitis of left toe: Secondary | ICD-10-CM | POA: Insufficient documentation

## 2016-07-16 DIAGNOSIS — E1165 Type 2 diabetes mellitus with hyperglycemia: Secondary | ICD-10-CM

## 2016-07-16 DIAGNOSIS — L97529 Non-pressure chronic ulcer of other part of left foot with unspecified severity: Secondary | ICD-10-CM

## 2016-07-16 DIAGNOSIS — L03116 Cellulitis of left lower limb: Secondary | ICD-10-CM

## 2016-07-16 DIAGNOSIS — I10 Essential (primary) hypertension: Secondary | ICD-10-CM

## 2016-07-16 DIAGNOSIS — E118 Type 2 diabetes mellitus with unspecified complications: Secondary | ICD-10-CM

## 2016-07-16 DIAGNOSIS — E785 Hyperlipidemia, unspecified: Secondary | ICD-10-CM

## 2016-07-16 LAB — GLUCOSE, CAPILLARY
GLUCOSE-CAPILLARY: 242 mg/dL — AB (ref 65–99)
GLUCOSE-CAPILLARY: 199 mg/dL — AB (ref 65–99)
Glucose-Capillary: 212 mg/dL — ABNORMAL HIGH (ref 65–99)
Glucose-Capillary: 183 mg/dL — ABNORMAL HIGH (ref 65–99)

## 2016-07-16 LAB — CBC
HEMATOCRIT: 36.7 % — AB (ref 39.0–52.0)
HEMOGLOBIN: 13.2 g/dL (ref 13.0–17.0)
MCH: 32.6 pg (ref 26.0–34.0)
MCHC: 36 g/dL (ref 30.0–36.0)
MCV: 90.6 fL (ref 78.0–100.0)
Platelets: 145 10*3/uL — ABNORMAL LOW (ref 150–400)
RBC: 4.05 MIL/uL — ABNORMAL LOW (ref 4.22–5.81)
RDW: 12.3 % (ref 11.5–15.5)
WBC: 13 10*3/uL — ABNORMAL HIGH (ref 4.0–10.5)

## 2016-07-16 LAB — BASIC METABOLIC PANEL
ANION GAP: 8 (ref 5–15)
BUN: 13 mg/dL (ref 6–20)
CALCIUM: 7.9 mg/dL — AB (ref 8.9–10.3)
CHLORIDE: 100 mmol/L — AB (ref 101–111)
CO2: 25 mmol/L (ref 22–32)
Creatinine, Ser: 1.12 mg/dL (ref 0.61–1.24)
GFR calc Af Amer: >60 mL/min (ref >60)
GFR calc non Af Amer: >60 mL/min (ref >60)
GLUCOSE: 256 mg/dL — AB (ref 65–99)
Potassium: 3.4 mmol/L — ABNORMAL LOW (ref 3.5–5.1)
Sodium: 133 mmol/L — ABNORMAL LOW (ref 135–145)

## 2016-07-16 LAB — APTT: aPTT: 33 seconds (ref 24–36)

## 2016-07-16 LAB — SEDIMENTATION RATE: Sed Rate: 47 mm/hr — ABNORMAL HIGH (ref 0–16)

## 2016-07-16 LAB — PROTIME-INR
INR: 1.08
PROTHROMBIN TIME: 14.1 s (ref 11.4–15.2)

## 2016-07-16 LAB — C-REACTIVE PROTEIN: CRP: 13.6 mg/dL — ABNORMAL HIGH (ref <1.0)

## 2016-07-16 MED ORDER — UREA 10 % EX LOTN
TOPICAL_LOTION | Freq: Two times a day (BID) | CUTANEOUS | Status: DC
  Administered 2016-07-16 – 2016-07-17 (×4): via TOPICAL
  Filled 2016-07-16: qty 177

## 2016-07-16 MED ORDER — PIPERACILLIN-TAZOBACTAM 3.375 G IVPB 30 MIN: 3.3750 g | Freq: Once | INTRAVENOUS | Status: DC

## 2016-07-16 MED ORDER — ONDANSETRON HCL 4 MG/2ML IJ SOLN
4.0000 mg | Freq: Four times a day (QID) | INTRAMUSCULAR | Status: DC | PRN
  Administered 2016-07-16: 4 mg via INTRAVENOUS
  Filled 2016-07-16: qty 2

## 2016-07-16 MED ORDER — ACETAMINOPHEN 325 MG PO TABS
650.0000 mg | ORAL_TABLET | Freq: Four times a day (QID) | ORAL | Status: DC | PRN
  Administered 2016-07-16 – 2016-07-18 (×7): 650 mg via ORAL
  Filled 2016-07-16 (×7): qty 2

## 2016-07-16 MED ORDER — ATORVASTATIN CALCIUM 40 MG PO TABS
40.0000 mg | ORAL_TABLET | Freq: Every day | ORAL | Status: DC
  Administered 2016-07-16 – 2016-07-18 (×3): 40 mg via ORAL
  Filled 2016-07-16 (×3): qty 1

## 2016-07-16 MED ORDER — HYDROMORPHONE HCL 1 MG/ML IJ SOLN
0.5000 mg | INTRAMUSCULAR | Status: AC
  Administered 2016-07-16: 0.5 mg via INTRAVENOUS
  Filled 2016-07-16: qty 1

## 2016-07-16 MED ORDER — METOCLOPRAMIDE HCL 5 MG/ML IJ SOLN
10.0000 mg | Freq: Once | INTRAMUSCULAR | Status: AC
  Administered 2016-07-16: 10 mg via INTRAVENOUS
  Filled 2016-07-16: qty 2

## 2016-07-16 MED ORDER — ACETAMINOPHEN 650 MG RE SUPP: 650.0000 mg | Freq: Four times a day (QID) | RECTAL | Status: DC | PRN

## 2016-07-16 MED ORDER — PIPERACILLIN-TAZOBACTAM 3.375 G IVPB
3.3750 g | Freq: Three times a day (TID) | INTRAVENOUS | Status: DC
  Administered 2016-07-16 – 2016-07-17 (×3): 3.375 g via INTRAVENOUS
  Filled 2016-07-16 (×5): qty 50

## 2016-07-16 MED ORDER — METOCLOPRAMIDE HCL 5 MG/ML IJ SOLN
5.0000 mg | Freq: Three times a day (TID) | INTRAMUSCULAR | Status: AC
Start: 2016-07-16 — End: 2016-07-17
  Administered 2016-07-16 – 2016-07-17 (×3): 5 mg via INTRAVENOUS
  Filled 2016-07-16 (×3): qty 2

## 2016-07-16 MED ORDER — VANCOMYCIN HCL IN DEXTROSE 1-5 GM/200ML-% IV SOLN
1000.0000 mg | Freq: Two times a day (BID) | INTRAVENOUS | Status: DC
Start: 2016-07-16 — End: 2016-07-17
  Administered 2016-07-16 (×2): 1000 mg via INTRAVENOUS
  Filled 2016-07-16 (×3): qty 200

## 2016-07-16 MED ORDER — OXYCODONE HCL 5 MG PO TABS
5.0000 mg | ORAL_TABLET | ORAL | Status: DC | PRN
  Administered 2016-07-16 – 2016-07-18 (×10): 5 mg via ORAL
  Filled 2016-07-16 (×10): qty 1

## 2016-07-16 MED ORDER — COLLAGENASE 250 UNIT/GM EX OINT
TOPICAL_OINTMENT | Freq: Every day | CUTANEOUS | Status: DC
  Filled 2016-07-16: qty 30

## 2016-07-16 MED ORDER — SODIUM CHLORIDE 0.9 % IV SOLN
INTRAVENOUS | Status: DC
  Administered 2016-07-16: 125 mL/h via INTRAVENOUS
  Administered 2016-07-16: 125 mL via INTRAVENOUS

## 2016-07-16 MED ORDER — INSULIN NPH (HUMAN) (ISOPHANE) 100 UNIT/ML ~~LOC~~ SUSP
12.0000 [IU] | Freq: Every day | SUBCUTANEOUS | Status: DC
  Administered 2016-07-16 – 2016-07-18 (×3): 12 [IU] via SUBCUTANEOUS

## 2016-07-16 MED ORDER — KETOROLAC TROMETHAMINE 15 MG/ML IJ SOLN
15.0000 mg | Freq: Once | INTRAMUSCULAR | Status: AC
  Administered 2016-07-16: 15 mg via INTRAVENOUS
  Filled 2016-07-16: qty 1

## 2016-07-16 MED ORDER — VANCOMYCIN HCL IN DEXTROSE 1-5 GM/200ML-% IV SOLN: 1000.0000 mg | Freq: Once | INTRAVENOUS | Status: DC

## 2016-07-16 MED ORDER — SODIUM CHLORIDE 0.9 % IV BOLUS (SEPSIS): 1000.0000 mL | Freq: Once | INTRAVENOUS | Status: AC

## 2016-07-16 MED ORDER — INSULIN ASPART 100 UNIT/ML ~~LOC~~ SOLN
0.0000 [IU] | Freq: Three times a day (TID) | SUBCUTANEOUS | Status: DC
  Administered 2016-07-16 (×2): 3 [IU] via SUBCUTANEOUS
  Administered 2016-07-16: 2 [IU] via SUBCUTANEOUS
  Administered 2016-07-17: 3 [IU] via SUBCUTANEOUS
  Administered 2016-07-17: 2 [IU] via SUBCUTANEOUS
  Administered 2016-07-17: 1 [IU] via SUBCUTANEOUS
  Administered 2016-07-18 (×2): 2 [IU] via SUBCUTANEOUS

## 2016-07-16 MED ORDER — GADOBENATE DIMEGLUMINE 529 MG/ML IV SOLN
20.0000 mL | Freq: Once | INTRAVENOUS | Status: AC
  Administered 2016-07-16: 19 mL via INTRAVENOUS

## 2016-07-16 MED ORDER — INSULIN NPH (HUMAN) (ISOPHANE) 100 UNIT/ML ~~LOC~~ SUSP
4.0000 [IU] | Freq: Every day | SUBCUTANEOUS | Status: DC
  Administered 2016-07-16 – 2016-07-17 (×2): 4 [IU] via SUBCUTANEOUS
  Filled 2016-07-16: qty 10

## 2016-07-16 MED ORDER — ENOXAPARIN SODIUM 40 MG/0.4ML ~~LOC~~ SOLN
40.0000 mg | SUBCUTANEOUS | Status: DC
  Administered 2016-07-16 – 2016-07-18 (×3): 40 mg via SUBCUTANEOUS
  Filled 2016-07-16 (×3): qty 0.4

## 2016-07-16 NOTE — ED Notes (Signed)
Attempted to call report to [facility]. No answer on floor.

## 2016-07-16 NOTE — Progress Notes (Signed)
PT Cancellation Note  Patient Details Name: [name] MRN: [id number] DOB: [birth year]   Cancelled Treatment:    Reason Eval/Treat Not Completed: Patient at procedure or test/unavailable. RN reports pt currently in [place]. He is also scheduled to undergo MRI today. PT to follow up as time allows.   Lorriane Shire 07/16/2016, 11:09 AM

## 2016-07-16 NOTE — Progress Notes (Signed)
**  Preliminary report by tech**  Left lower extremity venous duplex completed. There is no evidence of a deep or superficial vein thrombosis involving the left lower extremity. All visualized vessels appear patent and compressible. There is no evidence of a Baker's cyst on the left.  Incidental finding of an enlarged lymph node in the left groin.   ARTERIAL  ABI completed: Right ABI of 1.32 is suggestive of borderline arterial calcification with normal triphasic waveforms at rest. Left ABI of 1.24 is suggestive of normal arterial flow at rest. Right TBI of 1.07 and left TBI of 0.92 are suggestive of normal arterial flow at rest.   RIGHT    LEFT    PRESSURE WAVEFORM  PRESSURE WAVEFORM  BRACHIAL 157 Triphasic BRACHIAL 154 Triphasic  DP 207 Triphasic DP 195 Triphasic  PT 207 Triphasic PT 179 Triphasic  GREAT TOE 168 NA GREAT TOE 145 NA    RIGHT LEFT  ABI 1.32 1.24     Legrand Como, RVT 07/16/2016, 11:34 AM

## 2016-07-16 NOTE — Progress Notes (Signed)
Family Medicine Teaching Service Daily Progress Note Intern Pager: [phone]  Patient name: [name] Medical record number: [id number] Date of birth: 1965/12/08 Age: 50 y.o. Gender: male  Primary Care Provider: [name], MD Consultants: Orthopedic surgery Code Status: Full   Pt Overview and Major Events to Date:  10/10: MRI left leg  Arterial and   Venous doppler ultrasound of the left lower,   ABI   Assessment and Plan: #Leg cellulitis, acute, moderate to severe, nonpurulent Patient vitals are stable, and pain has improved overnight with medications. --Continue Vancomycin and Zosyn --Follow up on MRI --Follow up on arterial doppler --Follow up on venous doppler US --Continue Oxycodone 5 mg q4 prn for moderate pain --Tylenol 650 mg q6 for mild pain --Zofran 4 mg q6 prn --Follow up on orthopedic surgery recs  #Sepsis, acute, resolving Lactic acid has trended down from 2.79 on admission to 1.36. Patient continue to be afebrile --Will continue IVF and bolus as needed --Trend lactic acid  --Continue Vanc, Zosyn   #DM type II, chronic Continue current regimen --Continue NPH 12U in the morning and 4U at night  --Continue SSI --CBG monitoring  #Hyperlipidemia, chronic --Continue atorvastatin 40 mg once daily   #Hypertension, chronic Patient blood pressure on admission 126/86 in the setting of pain and SIRS. Patient has not been taking BP meds.  -- Will hold lisinopril 20 mg; will likely need anti-hypertensive regimen at discharge   FEN/GI: Heart healthy, Carb modified, NS 125 cc/hour Prophylaxis: Lovenox  Disposition: Admit to inpatient, pending ortho and surgery consult and further medical management  Subjective:  No acute events overnight. Patient was stable, pain was fairly well controlled. Patient had episode of emesis with breakfast and still experiencing some nausea.  Objective: Temp:  [98.9 F (37.2 C)-100.8 F (38.2 C)] 100.7 F (38.2 C) (10/10  0605) Pulse Rate:  [89-106] 101 (10/10 0605) Resp:  [15-26] 22 (10/10 0605) BP: (126-174)/(71-88) 174/86 (10/10 0605) SpO2:  [97 %-100 %] 98 % (10/10 0605) Weight:  [198 lb (89.8 kg)] 198 lb (89.8 kg) (10/09 2128) Physical Exam: General Appearance:  Pleasant gentleman, in mild discomfort, but  cooperative and able to answer questions and participate in exam. Lungs:  Normal expansion.  Clear to auscultation.  No rales, rhonchi, or wheezing. Heart:   Normal S1 and S2.  Regular rate and rhythm without murmur, gallop or rub. Abdomen:  Soft, non-tender, normal bowel sounds; no bruits, organomegaly or masses. Skin: Left lower leg erythema below knee and above ankle in the anterior aspect of the leg, warm to the touch, non-purulent. Plantar surface ulcer with overlying callous without drainage but fluctuance appreciated underneath. Callus is TTP and has some surrounding mild erythema.  Extremities: Extremities warm to touch, pink, with no edema. and pulses present in all extremities  Musculoskeletal:  No Homen sign, but decrease range of motion in left leg secondary to pain. Left left mildly swollen in comparison to right leg. No pain in shin with passive flexion of left big toe.  Neurologic:  Alert and oriented x 3,  reflexes normal and symmetric, strength and  sensation grossly normal, cranial nerves II-XII intact  Laboratory:  Recent Labs Lab 07/15/16 2142 07/16/16 0151  WBC 11.1* 13.0*  HGB 14.4 13.2  HCT 39.8 36.7*  PLT 159 145*    Recent Labs Lab 07/15/16 2142 07/16/16 0151  NA 131* 133*  K 3.7 3.4*  CL 99* 100*  CO2 24 25  BUN 12 13  CREATININE 1.14 1.12  CALCIUM 8.6* 7.9*  PROT 6.6  --   BILITOT 1.0  --   ALKPHOS 78  --   ALT 18  --   AST 18  --   GLUCOSE 348* 256*   Dg Chest 2 View  Result Date: 07/15/2016 CLINICAL DATA:  Sharp pain under right breast with productive cough, chills and fever EXAM: CHEST  2 VIEW COMPARISON:  06/29/2015 FINDINGS: The heart size and  mediastinal contours are within normal limits. Both lungs are clear. The visualized skeletal structures are unremarkable. IMPRESSION: No active cardiopulmonary disease. Electronically Signed   By: Donavan Foil M.D.   On: 07/15/2016 22:45   Dg Foot 2 Views Left  Result Date: 07/15/2016 CLINICAL DATA:  Left foot pain radiating to the thenar, open sore on sole of the foot at the distal first metatarsal for over 1 month EXAM: LEFT FOOT - 2 VIEW COMPARISON:  05/22/2016 FINDINGS: No acute displaced fracture or malalignment. Vascular calcifications are present. No soft tissue gas. No osteopenia or bone destruction is evident. Mild degenerative changes at the first MTP joint. IMPRESSION: No acute osseous abnormality Electronically Signed   By: Donavan Foil M.D.   On: 07/15/2016 22:47    Marjie Skiff, MD 07/16/2016, 12:22 PM PGY-1, [place] Intern pager: [phone], text pages welcome

## 2016-07-16 NOTE — Progress Notes (Signed)
Pharmacy Antibiotic Note  [name] is a 50 y.o. male admitted on 07/15/2016 with L foot cellulitis.  Pharmacy has been consulted for Vancomycin and Zosyn dosing.  Vanc 1.5gm and Zosyn 3.375gm given in ED ~2330  Plan: Zosyn 3.375gm IV q8h - doses over 4 hours Vancomycin 1gm IV q12h Will f/u micro data, renal function, and pt's clinical condition Vanc trough prn  Height: 5\' 11"  (180.3 cm) Weight: 198 lb (89.8 kg) IBW/kg (Calculated) : 75.3  Temp (24hrs), Avg:100.8 F (38.2 C), Min:100.8 F (38.2 C), Max:100.8 F (38.2 C)   Recent Labs Lab 07/15/16 2142 07/15/16 2210 07/15/16 2339  WBC 11.1*  --   --   CREATININE 1.14  --   --   LATICACIDVEN  --  2.79* 1.36    Estimated Creatinine Clearance: 84.4 mL/min (by C-G formula based on SCr of 1.14 mg/dL).    Allergies  Allergen Reactions  . Latex Rash    Pt states he is allergic to latex condoms    Antimicrobials this admission: 10/9 Zosyn >>  10/19 Vanc >>   Dose adjustments this admission: n/a  Microbiology results: 10/9 BCx x2:  10/9 UCx:    Thank you for allowing pharmacy to be a part of this patient's care.  Sherlon Handing, PharmD, BCPS Clinical pharmacist, pager [phone] 07/16/2016 12:46 AM

## 2016-07-17 ENCOUNTER — Encounter (HOSPITAL_COMMUNITY): Payer: Self-pay | Admitting: *Deleted

## 2016-07-17 DIAGNOSIS — E1142 Type 2 diabetes mellitus with diabetic polyneuropathy: Secondary | ICD-10-CM

## 2016-07-17 DIAGNOSIS — L97521 Non-pressure chronic ulcer of other part of left foot limited to breakdown of skin: Secondary | ICD-10-CM

## 2016-07-17 LAB — BASIC METABOLIC PANEL
Anion gap: 6 (ref 5–15)
BUN: 10 mg/dL (ref 6–20)
CHLORIDE: 98 mmol/L — AB (ref 101–111)
CO2: 28 mmol/L (ref 22–32)
CREATININE: 0.92 mg/dL (ref 0.61–1.24)
Calcium: 7.9 mg/dL — ABNORMAL LOW (ref 8.9–10.3)
GFR calc Af Amer: >60 mL/min (ref >60)
GLUCOSE: 198 mg/dL — AB (ref 65–99)
POTASSIUM: 4.4 mmol/L (ref 3.5–5.1)
Sodium: 132 mmol/L — ABNORMAL LOW (ref 135–145)

## 2016-07-17 LAB — GLUCOSE, CAPILLARY
GLUCOSE-CAPILLARY: 238 mg/dL — AB (ref 65–99)
GLUCOSE-CAPILLARY: 138 mg/dL — AB (ref 65–99)
GLUCOSE-CAPILLARY: 262 mg/dL — AB (ref 65–99)
Glucose-Capillary: 170 mg/dL — ABNORMAL HIGH (ref 65–99)

## 2016-07-17 LAB — CBC
HCT: 33.5 % — ABNORMAL LOW (ref 39.0–52.0)
Hemoglobin: 12.1 g/dL — ABNORMAL LOW (ref 13.0–17.0)
MCH: 31.9 pg (ref 26.0–34.0)
MCHC: 36.1 g/dL — AB (ref 30.0–36.0)
MCV: 88.4 fL (ref 78.0–100.0)
PLATELETS: 143 10*3/uL — AB (ref 150–400)
RBC: 3.79 MIL/uL — AB (ref 4.22–5.81)
RDW: 12.4 % (ref 11.5–15.5)
WBC: 13.7 10*3/uL — ABNORMAL HIGH (ref 4.0–10.5)

## 2016-07-17 LAB — URINE CULTURE: Culture: 80000 — AB

## 2016-07-17 MED ORDER — DOXYCYCLINE HYCLATE 100 MG PO TABS
100.0000 mg | ORAL_TABLET | Freq: Two times a day (BID) | ORAL | Status: DC
  Administered 2016-07-17 – 2016-07-18 (×3): 100 mg via ORAL
  Filled 2016-07-17 (×3): qty 1

## 2016-07-17 NOTE — Consult Note (Signed)
ORTHOPAEDIC CONSULTATION  REQUESTING PHYSICIAN: Blane Ohara McDiarmid, MD  Chief Complaint: Cellulitis ulceration left great toe first metatarsal head  HPI: [name] is a 50 y.o. male who presents with pain ulcer and cellulitis left great toe first metatarsal head. Patient states initially this was draining but the draining stopping his pain increased. Patient has diabetes with insensate neuropathy status post other foot ulcerations and status post irrigation and debridement of the left foot 2013.[name] Patient is status post a back injury in [place] and is currently disabled.  Past Medical History:  Diagnosis Date  . Allergy   . Diabetes mellitus    Type 2 DM, Patient reports DM Type 1 diagnosd age at age 34  . Diabetic foot ulcers ([place])   . Foreign body in left foot    with  infection  . Hypertension   . Meniscus tear   . Multiple rib fractures   . Personal history of diabetic foot ulcer, Left Foot    Past Surgical History:  Procedure Laterality Date  . EYE SURGERY    . I&D EXTREMITY  04/23/2012   Procedure: IRRIGATION AND DEBRIDEMENT EXTREMITY;  Surgeon: Newt Minion, MD;  Location: [facility];  Service: Orthopedics;  Laterality: Left;  Irrigation and Debridement Left Foot  . I&D EXTREMITY  09/18/2012   Procedure: IRRIGATION AND DEBRIDEMENT EXTREMITY;  Surgeon: Newt Minion, MD;  Location: [facility];  Service: Orthopedics;  Laterality: Left;  . MENISCUS REPAIR Left 04/2016   Orthopedist, Dr [name] in [place], [place]   Social History   Social History  . Marital status: Married    Spouse name: [name]  . Number of children: [name]  . Years of education: [name]   Social History Main Topics  . Smoking status: Former Smoker    Types: Cigarettes    Quit date: 09/03/2012  . Smokeless tobacco: Former Systems developer    Quit date: 09/03/2012  . Alcohol use 4.8 oz/week    6 Cans of beer, 2 Shots of liquor per week     Comment: weekends.  . Drug use: No  . Sexual activity: Yes   Other  Topics Concern  . None   Social History Narrative  . None   Family History  Problem Relation Age of Onset  . Diabetes type II Mother   . Arthritis Mother   . Diabetes Mother   . Hyperlipidemia Mother   . Hypertension Mother   . Diabetes type II Sister   . Diabetes Father    - negative except otherwise stated in the family history section Allergies  Allergen Reactions  . Latex Rash    Pt states he is allergic to latex condoms   Prior to Admission medications   Medication Sig Start Date End Date Taking? Authorizing Provider  ibuprofen (ADVIL,MOTRIN) 200 MG tablet Take 400 mg by mouth every 6 (six) hours as needed for fever or moderate pain.   Yes Historical Provider, MD  insulin NPH Human (HUMULIN N,NOVOLIN N) 100 UNIT/ML injection Inject 4-12 Units into the skin 2 (two) times daily before a meal.   Yes Historical Provider, MD  metFORMIN (GLUCOPHAGE) 500 MG tablet Take 1 tablet (500 mg total) by mouth 2 (two) times daily with a meal. 01/30/15  Yes [name], DO  atorvastatin (LIPITOR) 40 MG tablet Take 1 tablet (40 mg total) by mouth daily. Patient not taking: Reported on 07/15/2016 05/28/16   [name], MD  lisinopril (PRINIVIL,ZESTRIL) 20 MG tablet Take 1  tablet (20 mg total) by mouth daily. Patient not taking: Reported on 07/15/2016 01/30/15   [name], DO  sulfamethoxazole-trimethoprim (BACTRIM DS,SEPTRA DS) 800-160 MG tablet Take 1 tablet by mouth 2 (two) times daily. Patient not taking: Reported on 07/15/2016 05/22/16   [name], MD   Dg Chest 2 View  Result Date: 07/15/2016 CLINICAL DATA:  Sharp pain under right breast with productive cough, chills and fever EXAM: CHEST  2 VIEW COMPARISON:  06/29/2015 FINDINGS: The heart size and mediastinal contours are within normal limits. Both lungs are clear. The visualized skeletal structures are unremarkable. IMPRESSION: No active cardiopulmonary disease. Electronically Signed   By: Donavan Foil M.D.   On:  07/15/2016 22:45   Mr Foot Left W Wo Contrast  Result Date: 07/16/2016 CLINICAL DATA:  Radiating left foot pain.  For 1 month. EXAM: MRI OF THE LEFT FOREFOOT WITHOUT AND WITH CONTRAST TECHNIQUE: Multiplanar, multisequence MR imaging was performed both before and after administration of intravenous contrast. CONTRAST:  16mL MULTIHANCE GADOBENATE DIMEGLUMINE 529 MG/ML IV SOLN COMPARISON:  None. FINDINGS: Bones/Joint/Cartilage No marrow signal abnormality. No fracture or dislocation. Normal alignment. No joint effusion. Ligaments Collateral ligaments are intact. Muscles and Tendons Flexor, peroneal and extensor compartment tendons are intact. Soft tissue Soft tissue ulcer along the plantar aspect of the first MTP joint with mild surrounding edema. No surrounding fluid collection to suggest an abscess. No soft tissue mass. IMPRESSION: 1. Soft tissue ulcer along the plantar aspect of the first MTP joint with mild cellulitis. No drainable fluid collection to suggest an abscess. No evidence of osteomyelitis of the left forefoot. Electronically Signed   By: Kathreen Devoid   On: 07/16/2016 16:34   Dg Foot 2 Views Left  Result Date: 07/15/2016 CLINICAL DATA:  Left foot pain radiating to the thenar, open sore on sole of the foot at the distal first metatarsal for over 1 month EXAM: LEFT FOOT - 2 VIEW COMPARISON:  05/22/2016 FINDINGS: No acute displaced fracture or malalignment. Vascular calcifications are present. No soft tissue gas. No osteopenia or bone destruction is evident. Mild degenerative changes at the first MTP joint. IMPRESSION: No acute osseous abnormality Electronically Signed   By: Donavan Foil M.D.   On: 07/15/2016 22:47   - pertinent xrays, CT, MRI studies were reviewed and independently interpreted  Positive ROS: All other systems have been reviewed and were otherwise negative with the exception of those mentioned in the HPI and as above.  Physical Exam: General: Alert, no acute  distress Psychiatric: Patient is competent for consent with normal mood and affect Lymphatic: No axillary or cervical lymphadenopathy Cardiovascular: No pedal edema Respiratory: No cyanosis, no use of accessory musculature GI: No organomegaly, abdomen is soft and non-tender  Skin: Patient has some cellulitis in the left lower extremity below the knee. The outlined area appears to be resolving. Patient has a ulcer on the plantar aspect of the left great toe. This is not draining patient complains of pain with palpation.   Neurologic: Patient does not have protective sensation bilateral lower extremities.   MUSCULOSKELETAL:  On examination patient has a strong dorsalis pedis pulse his ankle brachial indices show adequate circulation with triphasic flow. The MRI scan is reviewed shows no evidence of osteomyelitis no evidence of deep abscess area  After informed consent scissors and pickups were used to debride the skin and soft tissue back to bleeding viable granulation tissue the ulcer is 2 cm in diameter 5 mm deep this goes down  to healthy granulation tissue this does not probe to bone or tendon. There is no deep abscess below the granulation tissue.   Assessment: Assessment: Diabetic insensate neuropathy with Earleen Newport grade 1 ulcer left great toe plantar aspect MTP joint status post debridement of skin and soft tissue at bedside with no evidence of osteomyelitis or deep abscess.  Plan: Plan: Patient may begin Dial soap cleansing daily with dry dressing change daily we will order a Darco shoe for weightbearing to the heel only patient can be discharged to home once his acute cellulitis has resolved and I will follow-up in the office next week.  Thank you for the consult and the opportunity to see Mr. Kweku Stankey, [place] [phone] 9:24 AM

## 2016-07-17 NOTE — Evaluation (Signed)
Physical Therapy Evaluation Patient Details Name: [name] MRN: [id number] DOB: 1966/09/06 Today's Date: 07/17/2016   History of Present Illness  Pt is a 50 y/o male admitted for cellulitis of L LE and callus ulcer on L foot. PMH including but not limited to DM and HTN.  Clinical Impression  Pt presented sitting OOB in recliner when PT entered room. Prior to admission, pt stated that he was independent with all functional mobility. Pt moving well during evaluation and did not require any physical assistance during functional mobility. Pt also successfully completed stair training during session. Pt would continue to benefit from skilled physical therapy services at this time while admitted to address his below listed limitations in order to improve his overall safety and independence with functional mobility.      Follow Up Recommendations Supervision for mobility/OOB    Equipment Recommendations  Rolling walker with 5" wheels    Recommendations for Other Services       Precautions / Restrictions Precautions Precautions: Fall Required Braces or Orthoses: Other Brace/Splint Other Brace/Splint: DARCO shoe Restrictions Weight Bearing Restrictions: Yes LLE Weight Bearing: Weight bearing as tolerated Other Position/Activity Restrictions: WBAT through heel      Mobility  Bed Mobility               General bed mobility comments: pt sitting OOB in recliner when PT entered room  Transfers Overall transfer level: Needs assistance Equipment used: Rolling walker (2 wheeled) Transfers: Sit to/from Stand Sit to Stand: Supervision         General transfer comment: pt required increased time and VC'ing for bilateral hand placement  Ambulation/Gait Ambulation/Gait assistance: Min guard Ambulation Distance (Feet): 150 Feet Assistive device: Rolling walker (2 wheeled) Gait Pattern/deviations: Step-through pattern;Decreased step length - right;Decreased stance time -  left;Decreased weight shift to left Gait velocity: decreased Gait velocity interpretation: Below normal speed for age/gender General Gait Details: pt required VC'ing for sequencing with RW  Stairs Stairs: Yes Stairs assistance: Min guard Stair Management: Two rails;Step to pattern;Forwards Number of Stairs: 3 General stair comments: pt ascended with R LE leading and descended with L LE leading  Wheelchair Mobility    Modified Rankin (Stroke Patients Only)       Balance Overall balance assessment: Needs assistance Sitting-balance support: Feet supported;No upper extremity supported Sitting balance-Leahy Scale: Fair     Standing balance support: During functional activity;No upper extremity supported Standing balance-Leahy Scale: Fair                               Pertinent Vitals/Pain Pain Assessment: Faces Faces Pain Scale: Hurts little more Pain Location: L medial thigh (pt reported having a "muscle strain") Pain Descriptors / Indicators: Sore;Grimacing Pain Intervention(s): Monitored during session;Repositioned    Home Living Family/patient expects to be discharged to:: Private residence Living Arrangements: Spouse/significant other;Children Available Help at Discharge: Family;Available PRN/intermittently Type of Home: House Home Access: Stairs to enter Entrance Stairs-Rails: Can reach both Entrance Stairs-Number of Steps: 1 Home Layout: One level Home Equipment: Cane - single point;Crutches      Prior Function Level of Independence: Independent               Hand Dominance        Extremity/Trunk Assessment   Upper Extremity Assessment: Overall WFL for tasks assessed           Lower Extremity Assessment: LLE deficits/detail   LLE Deficits / Details: pt  is now WBAT L LE with DARCO shoe. He was able to move L LE against gravity independently during functional mobility.  Cervical / Trunk Assessment: Normal  Communication    Communication: No difficulties  Cognition Arousal/Alertness: Awake/alert Behavior During Therapy: WFL for tasks assessed/performed Overall Cognitive Status: Within Functional Limits for tasks assessed                      General Comments      Exercises     Assessment/Plan    PT Assessment Patient needs continued PT services  PT Problem List Decreased strength;Decreased range of motion;Decreased activity tolerance;Decreased balance;Decreased mobility;Decreased coordination;Decreased knowledge of use of DME;Pain          PT Treatment Interventions DME instruction;Gait training;Stair training;Therapeutic activities;Functional mobility training;Therapeutic exercise;Balance training;Neuromuscular re-education;Patient/family education    PT Goals (Current goals can be found in the Care Plan section)  Acute Rehab PT Goals Patient Stated Goal: return home PT Goal Formulation: With patient Time For Goal Achievement: 07/24/16 Potential to Achieve Goals: Good    Frequency Min 5X/week   Barriers to discharge        Co-evaluation               End of Session Equipment Utilized During Treatment: Gait belt Activity Tolerance: Patient tolerated treatment well Patient left: in chair;with call bell/phone within reach Nurse Communication: Mobility status         Time: 6415-8309 PT Time Calculation (min) (ACUTE ONLY): 17 min   Charges:   PT Evaluation $PT Eval Moderate Complexity: 1 Procedure     PT G CodesClearnce Sorrel Dwayne Bulkley 07/17/2016, 1:07 PM Sherie Don, [place], DPT [phone]

## 2016-07-17 NOTE — Progress Notes (Signed)
Family Medicine Teaching Service Daily Progress Note Intern Pager: [phone]  Patient name: [name] Medical record number: [id number] Date of birth: March 12, 1966 Age: 50 y.o. Gender: male  Primary Care Provider: [name], MD Consultants: Orthopedic surgery Code Status: Full   Pt Overview and Major Events to Date:  10/10: MRI left leg  Arterial and   Venous doppler ultrasound of the left lower,   ABI   Assessment and Plan: #Leg cellulitis, acute, moderate to severe, nonpurulent Patient has been stable, with no evidence of osteomyelitis or abscess seen on imaging. ABI showed normal arterial flow with some mild calcification on the right. No evidence of DVTs found in the left lower leg. Orthopedics recommend patient start Dial soap cleansing daily with dry dressing change daily. Patient wil be discharge with Darco boot with weight bearing to the heel which will alleviate on MTP ulcer and promote quicker healing. --Start patient on Doxycycline 100mg  BID po --d/c IV vancomycin and zosyn  --Will consult Dr. Sharol Given in the am to assess for possible debridement, appreciate rec  --Continue Oxycodone 5 mg q4 prn for moderate pain --Tylenol 650 mg q6 for mild pain --Zofran 4 mg q6 prn --PT/OT  #Sepsis, acute, resolved Lactic acid has trended down from 2.79 on admission to 1.36. Patient continue to be afebrile. Blood cultures showed no growth to date. --Will continue IVF and bolus as needed --Continue Vancomycin and  Zosyn, consider de-escalating  #DM type II, chronic Blood glucose well controlled while inpatient. Would continue current regimen. --Continue NPH 12U in the morning and 4U at night  --Continue SSI --CBG monitoring  #Hyperlipidemia, chronic --Continue atorvastatin 40 mg once daily   #Hypertension, chronic Patient blood pressure on admission 126/86 in the setting of pain and SIRS. Patient has not been taking BP meds.  -- Will hold lisinopril 20 mg; will likely need  anti-hypertensive regimen at discharge   FEN/GI: Heart healthy, Carb modified, NS 125 cc/hour Prophylaxis: Lovenox  Disposition: anticipate discharge tomorrow (07/17/2016)  Subjective:  No acute events overnight. Patient's pain is well controlled, nausea has resolved without any episode of emesis. Patient denies emesis, fever and chills. Erythema on his leg has improved. Objective: Temp:  [98.6 F (37 C)-100.7 F (38.2 C)] 98.6 F (37 C) (10/10 2022) Pulse Rate:  [87-101] 87 (10/10 2022) Resp:  [18-22] 18 (10/10 2022) BP: (139-174)/(71-86) 139/85 (10/10 2022) SpO2:  [98 %-99 %] 99 % (10/10 2022) Physical Exam: General Appearance:  Pleasant gentleman, in mild discomfort, but  cooperative and able to answer questions and participate in exam. Lungs:  Normal expansion.  Clear to auscultation.  No rales, rhonchi, or wheezing. Heart:   Normal S1 and S2.  Regular rate and rhythm without murmur, gallop or rub. Abdomen:  Soft, non-tender, normal bowel sounds; no bruits, organomegaly or masses. Skin: Left lower leg erythema below knee and above ankle in the anterior aspect of the leg. Plantar surface ulcer without callous or drainage and no fluctuance appreciated on exam. Extremities: Extremities warm to touch, pink, with no edema. and pulses present in all extremities  Musculoskeletal:  No Homen sign,range of motion in left leg has improved with resolving cellulitis .  Neurologic:  Alert and oriented x 3,  reflexes normal and symmetric, strength and  sensation grossly normal, cranial nerves II-XII intact  Laboratory:  Recent Labs Lab 07/15/16 2142 07/16/16 0151  WBC 11.1* 13.0*  HGB 14.4 13.2  HCT 39.8 36.7*  PLT 159 145*    Recent Labs Lab 07/15/16  2142 07/16/16 0151  NA 131* 133*  K 3.7 3.4*  CL 99* 100*  CO2 24 25  BUN 12 13  CREATININE 1.14 1.12  CALCIUM 8.6* 7.9*  PROT 6.6  --   BILITOT 1.0  --   ALKPHOS 78  --   ALT 18  --   AST 18  --   GLUCOSE 348* 256*   Mr  Foot Left W Wo Contrast  Result Date: 07/16/2016 CLINICAL DATA:  Radiating left foot pain.  For 1 month. EXAM: MRI OF THE LEFT FOREFOOT WITHOUT AND WITH CONTRAST TECHNIQUE: Multiplanar, multisequence MR imaging was performed both before and after administration of intravenous contrast. CONTRAST:  64mL MULTIHANCE GADOBENATE DIMEGLUMINE 529 MG/ML IV SOLN COMPARISON:  None. FINDINGS: Bones/Joint/Cartilage No marrow signal abnormality. No fracture or dislocation. Normal alignment. No joint effusion. Ligaments Collateral ligaments are intact. Muscles and Tendons Flexor, peroneal and extensor compartment tendons are intact. Soft tissue Soft tissue ulcer along the plantar aspect of the first MTP joint with mild surrounding edema. No surrounding fluid collection to suggest an abscess. No soft tissue mass. IMPRESSION: 1. Soft tissue ulcer along the plantar aspect of the first MTP joint with mild cellulitis. No drainable fluid collection to suggest an abscess. No evidence of osteomyelitis of the left forefoot. Electronically Signed   By: Kathreen Devoid   On: 07/16/2016 16:34    Marjie Skiff, MD 07/17/2016, 3:43 AM PGY-1, [place] Intern pager: [phone], text pages welcome

## 2016-07-17 NOTE — Progress Notes (Signed)
Orthopedic Tech Progress Note Patient Details:  [name] [birth year] [id number]  Ortho Devices Type of Ortho Device: Darco shoe Ortho Device/Splint Location: lle Ortho Device/Splint Interventions: Application   [name] 07/17/2016, 10:05 AM

## 2016-07-17 NOTE — Progress Notes (Signed)
Stopped by to ask if patient wished to receive visit from [employer] when requesting that for another 5N patient. Patient was asleep. Patient or nurse can request this service at any time from the Office of [place]. Visits are made on M, W, R. Chaplain available for follow-up.   07/17/16 1600  Clinical Encounter Type  Visited With Patient not available  Visit Type Initial  Referral From Chaplain  Stress Factors  Patient Stress Factors Health changes  Family Stress Factors None identified

## 2016-07-17 NOTE — Progress Notes (Signed)
PT Cancellation Note  Patient Details Name: [name] MRN: [id number] DOB: [birth year]   Cancelled Treatment:    Reason Eval/Treat Not Completed: Other (comment). Pt stating his L foot is very sore from the MD "cutting out the sore" approximately 30 minutes ago. Pt is now WBAT L LE through heel with DARCO shoe donned. Pt awaiting delivery of DARCO shoe. PT will re-attempt at a later time as appropriate.   Clearnce Sorrel [place] 07/17/2016, 9:48 AM Sherie Don, PT, DPT [phone]

## 2016-07-18 ENCOUNTER — Encounter (HOSPITAL_COMMUNITY): Payer: Self-pay | Admitting: General Practice

## 2016-07-18 ENCOUNTER — Ambulatory Visit: Payer: Self-pay | Admitting: Physical Therapy

## 2016-07-18 LAB — GLUCOSE, CAPILLARY
Glucose-Capillary: 171 mg/dL — ABNORMAL HIGH (ref 65–99)
Glucose-Capillary: 197 mg/dL — ABNORMAL HIGH (ref 65–99)

## 2016-07-18 MED ORDER — DOXYCYCLINE HYCLATE 100 MG PO TABS: 100.0000 mg | ORAL_TABLET | Freq: Two times a day (BID) | ORAL | 0 refills | Status: AC

## 2016-07-18 MED ORDER — UREA 10 % EX LOTN: TOPICAL_LOTION | Freq: Two times a day (BID) | CUTANEOUS | 0 refills | Status: DC

## 2016-07-18 NOTE — Care Management Note (Signed)
Case Management Note  Patient Details  Name: [name] MRN: [id number] Date of Birth: 04-Nov-1965  Subjective/Objective:      Left leg cellulitis            Action/Plan: Discharge Planning: AVS reviewed: NCM spoke to pt and states he will purchase RW out of pocket. Pt can afford medications at home. No insurance coverage.    Expected Discharge Date:  07/18/2016             Expected Discharge Plan:  Home/Self Care  In-House Referral:  NA  Discharge planning Services  CM Consult  Post Acute Care Choice:  NA Choice offered to:  NA  DME Arranged:  N/A DME Agency:  NA  HH Arranged:  NA HH Agency:  NA  Status of Service:  Completed, signed off  If discussed at [place] of Stay Meetings, dates discussed:    Additional Comments:  Erenest Rasher, RN 07/18/2016, 11:37 AM

## 2016-07-18 NOTE — Progress Notes (Signed)
Inpatient Diabetes Program Recommendations  AACE/ADA: New Consensus Statement on Inpatient Glycemic Control (2015)  Target Ranges:  Prepandial:   less than 140 mg/dL      Peak postprandial:   less than 180 mg/dL (1-2 hours)      Critically ill patients:  140 - 180 mg/dL   Lab Results  Component Value Date   GLUCAP 171 (H) 07/18/2016   HGBA1C 9.6 05/23/2016    Review of Glycemic Control  Results for BRICYN, LABRADA (MRN [id number]) as of 07/18/2016 10:35  Ref. Range 07/17/2016 06:05 07/17/2016 11:58 07/17/2016 16:40 07/17/2016 22:18 07/18/2016 06:36  Glucose-Capillary Latest Ref Range: 65 - 99 mg/dL 170 (H) 238 (H) 138 (H) 262 (H) 171 (H)    Diabetes history: Type 2  Outpatient Diabetes medications: Humulin 4-12 units bid, Metformin 500mg  bid, NPH 4 units at hs  Current orders for Inpatient glycemic control: NPH 12 units qam, 4 units qhs, Novolog 0-9 units tid  Inpatient Diabetes Program Recommendations:  Post prandial blood sugars elevated consider adding Novolog 2 units tid with meals.  Continue Novolog correction as ordered.  Gentry Fitz, RN, BA, MHA, CDE Diabetes Coordinator Inpatient Diabetes Program  [phone] (Team Pager) [phone] ([place]) 07/18/2016 10:37 AM

## 2016-07-18 NOTE — Discharge Summary (Signed)
[facility] Discharge Summary  Patient name: [name] Medical record number: [id number] Date of birth: March 29, 1966 Age: 50 y.o. Gender: male Date of Admission: 07/15/2016  Date of Discharge:07/18/2016 Admitting Physician: Blane Ohara McDiarmid, MD  Primary Care Provider: [name], MD Consultants: Orthopedic Surgery  Indication for Hospitalization: Leg pain  Discharge Diagnoses/Problem List:  Left leg Cellulitis DMII HTN HLD   Disposition: Home   Discharge Condition: Stable  Discharge Exam:  General Appearance: Pleasant gentleman, in mild discomfort, but cooperative and able to answer questions and participate in exam. Lungs:Normal expansion. Clear to auscultation. No rales, rhonchi, or wheezing. Heart:Normal S1 and S2. Regular rate and rhythm without murmur, gallop or rub. Abdomen:Soft, non-tender, normal bowel sounds; no bruits, organomegaly or masses. Skin: Left lower leg erythema below knee and above ankle in the anterior aspect of the leg. Plantar surface ulcer without callous or drainage and no fluctuance appreciated on exam. Extremities:Extremities warm to touch, pink, with no edema. and pulses presentin all extremities  Musculoskeletal:No Homen sign,range of motion in left leg has improved with resolving cellulitis .  Neurologic:Alert and oriented x 3, reflexes normal and symmetric, strength and sensation grossly normal, cranial nerves II-XII intact  Brief Hospital Course:  [name] is a 50 y.o. male with a past medical history significant for Diabetes type II, Hypertension, right knee meniscus tear s/p repair, multiple rib fractures who presented left leg erythema and pain consistent with cellulitis.  #Leg cellulitis, acute, moderate to severe, nonpurulent Patient admitted for left leg pain accompanied with erythema consistent with tibial cellulitis most likely secondary to left plantar diabetic ulcer. Initial  presentation with extensive erythema in the anterior shin and first toe MTP distribution on the plantar surface. X ray in the ED did not show any bony involvement. Patient started on Vancomycin and Zosyn. Given systemic symptoms on presentation, rapid spread of erythema and non healing diabetic ulcer,there were concerns for osteomyelitis. MRI of the left food was negative for osteomyelitis. PE was also rule out, with normal doppler US of left leg. Patient also had normal ABI. Left plantar ulcer was removed and ulcer was cleaned and bandage. Orthopedic Surgery ( Dr.[name]) was consulted with no further surgical management needed. Patient prescribed Darco boot to aid in plantar ulcer healing with clear instruction on how to care for wound. Patient transitioned to Doxycycline prior to discharge and will complete a 10 days total course. Patient was also seen by PT while inpatient. --Complete Doxycycline 100mg  BID po end date 07/24/2016  #Sepsis, acute, resolved  Lactic acid quickly trended down from 2.79 on admission to 1.36. Patient continue to be afebrile and rest of vitals were normal. Blood cultures showed no growth. Patient will complete antibiotic course as seen above.  #DM type II, chronic Patient continued home regimen and was placed on SSI. Blood glucose well controlled while inpatient. Patient will resume regimen after discharge. --Resume NPH 12U in the morning and 4U at night  --Resume Metformin 500 mg BID  #Hyperlipidemia, chronic --Continue atorvastatin 40 mg once daily   #Hypertension, chronic Patient blood pressure on admission 126/86 in the setting of pain and SIRS. Patient has not been taking BP meds.  -- Resume lisinopril 20 mg  Issues for Follow Up:  1. Complete Doxycycline course 7 more days. End date 07/25/2016 2. Follow up with Dr. Sharol Given Office in about a week   Significant Procedures:  MRI left foot  ABI left foot TBI left foot Doppler US left leg  Significant Labs and  Imaging:   Recent Labs Lab 07/15/16 2142 07/16/16 0151 07/17/16 0405  WBC 11.1* 13.0* 13.7*  HGB 14.4 13.2 12.1*  HCT 39.8 36.7* 33.5*  PLT 159 145* 143*    Recent Labs Lab 07/15/16 2142 07/16/16 0151 07/17/16 0405  NA 131* 133* 132*  K 3.7 3.4* 4.4  CL 99* 100* 98*  CO2 24 25 28   GLUCOSE 348* 256* 198*  BUN 12 13 10   CREATININE 1.14 1.12 0.92  CALCIUM 8.6* 7.9* 7.9*  ALKPHOS 78  --   --   AST 18  --   --   ALT 18  --   --   ALBUMIN 3.0*  --   --    Imaging: Dg Chest 2 View  Result Date: 07/15/2016 CLINICAL DATA:  Sharp pain under right breast with productive cough, chills and fever EXAM: CHEST  2 VIEW COMPARISON:  06/29/2015 FINDINGS: The heart size and mediastinal contours are within normal limits. Both lungs are clear. The visualized skeletal structures are unremarkable. IMPRESSION: No active cardiopulmonary disease. Electronically Signed   By: Donavan Foil M.D.   On: 07/15/2016 22:45   Mr Foot Left W Wo Contrast  Result Date: 07/16/2016 CLINICAL DATA:  Radiating left foot pain.  For 1 month. EXAM: MRI OF THE LEFT FOREFOOT WITHOUT AND WITH CONTRAST TECHNIQUE: Multiplanar, multisequence MR imaging was performed both before and after administration of intravenous contrast. CONTRAST:  21mL MULTIHANCE GADOBENATE DIMEGLUMINE 529 MG/ML IV SOLN COMPARISON:  None. FINDINGS: Bones/Joint/Cartilage No marrow signal abnormality. No fracture or dislocation. Normal alignment. No joint effusion. Ligaments Collateral ligaments are intact. Muscles and Tendons Flexor, peroneal and extensor compartment tendons are intact. Soft tissue Soft tissue ulcer along the plantar aspect of the first MTP joint with mild surrounding edema. No surrounding fluid collection to suggest an abscess. No soft tissue mass. IMPRESSION: 1. Soft tissue ulcer along the plantar aspect of the first MTP joint with mild cellulitis. No drainable fluid collection to suggest an abscess. No evidence of osteomyelitis of the  left forefoot. Electronically Signed   By: Kathreen Devoid   On: 07/16/2016 16:34   Dg Foot 2 Views Left  Result Date: 07/15/2016 CLINICAL DATA:  Left foot pain radiating to the thenar, open sore on sole of the foot at the distal first metatarsal for over 1 month EXAM: LEFT FOOT - 2 VIEW COMPARISON:  05/22/2016 FINDINGS: No acute displaced fracture or malalignment. Vascular calcifications are present. No soft tissue gas. No osteopenia or bone destruction is evident. Mild degenerative changes at the first MTP joint. IMPRESSION: No acute osseous abnormality Electronically Signed   By: Donavan Foil M.D.   On: 07/15/2016 22:47   Results/Tests Pending at Time of Discharge: None  Discharge Medications:    Medication List    STOP taking these medications   sulfamethoxazole-trimethoprim 800-160 MG tablet Commonly known as:  BACTRIM DS,SEPTRA DS     TAKE these medications   atorvastatin 40 MG tablet Commonly known as:  LIPITOR Take 1 tablet (40 mg total) by mouth daily.   doxycycline 100 MG tablet Commonly known as:  VIBRA-TABS Take 1 tablet (100 mg total) by mouth every 12 (twelve) hours.   ibuprofen 200 MG tablet Commonly known as:  ADVIL,MOTRIN Take 400 mg by mouth every 6 (six) hours as needed for fever or moderate pain.   insulin NPH Human 100 UNIT/ML injection Commonly known as:  HUMULIN N,NOVOLIN N Inject 4-12 Units into the skin 2 (two) times daily  before a meal.   lisinopril 20 MG tablet Commonly known as:  PRINIVIL,ZESTRIL Take 1 tablet (20 mg total) by mouth daily.   metFORMIN 500 MG tablet Commonly known as:  GLUCOPHAGE Take 1 tablet (500 mg total) by mouth 2 (two) times daily with a meal.   urea 10 % lotion Apply topically 2 (two) times daily.       Discharge Instructions: Please refer to Patient Instructions section of EMR for full details.  Patient was counseled important signs and symptoms that should prompt return to medical care, changes in medications, dietary  instructions, activity restrictions, and follow up appointments.   Follow-Up Appointments: Follow-up Information    Newt Minion, MD Follow up in 1 week(s).   Specialty:  Orthopedic Surgery Contact information: [place] [address] [phone]           Marjie Skiff, MD 07/18/2016, 10:39 AM PGY-1, [place]

## 2016-07-20 LAB — CULTURE, BLOOD (ROUTINE X 2)
CULTURE: NO GROWTH
Culture: NO GROWTH

## 2016-07-23 ENCOUNTER — Ambulatory Visit: Payer: Self-pay | Admitting: Physical Therapy

## 2016-07-23 ENCOUNTER — Ambulatory Visit (INDEPENDENT_AMBULATORY_CARE_PROVIDER_SITE_OTHER): Payer: Worker's Compensation | Admitting: Orthopaedic Surgery

## 2016-07-23 DIAGNOSIS — S83242D Other tear of medial meniscus, current injury, left knee, subsequent encounter: Secondary | ICD-10-CM | POA: Diagnosis not present

## 2016-07-25 ENCOUNTER — Ambulatory Visit: Payer: Self-pay | Admitting: Physical Therapy

## 2016-07-26 ENCOUNTER — Ambulatory Visit (INDEPENDENT_AMBULATORY_CARE_PROVIDER_SITE_OTHER): Payer: Worker's Compensation | Admitting: Family

## 2016-07-26 DIAGNOSIS — L97521 Non-pressure chronic ulcer of other part of left foot limited to breakdown of skin: Secondary | ICD-10-CM

## 2016-07-30 ENCOUNTER — Ambulatory Visit: Payer: Self-pay | Admitting: Physical Therapy

## 2016-07-30 DIAGNOSIS — M6281 Muscle weakness (generalized): Secondary | ICD-10-CM

## 2016-07-30 DIAGNOSIS — M25562 Pain in left knee: Principal | ICD-10-CM

## 2016-07-30 DIAGNOSIS — G8929 Other chronic pain: Secondary | ICD-10-CM

## 2016-07-30 DIAGNOSIS — M25662 Stiffness of left knee, not elsewhere classified: Secondary | ICD-10-CM

## 2016-07-30 NOTE — Therapy (Signed)
[place], [place], [address] Phone: [phone]   Fax:  [phone]  Physical Therapy Treatment / Re-evaluation  Patient Details  Name: [name] MRN: [id number] Date of Birth: 19-Nov-1965 Referring Provider: Rodell Perna Md  Encounter Date: 07/30/2016      PT End of Session - 07/30/16 1544    Visit Number 3   Number of Visits 13   Date for PT Re-Evaluation 08/13/16   PT Start Time 1500   PT Stop Time 1554   PT Time Calculation (min) 54 min   Activity Tolerance Patient tolerated treatment well   Behavior During Therapy [facility] for tasks assessed/performed      Past Medical History:  Diagnosis Date  . Allergy   . Diabetes mellitus    Type 2 DM, Patient reports DM Type 1 diagnosd age at age 55  . Diabetic foot ulcers ([place])   . Foreign body in left foot    with  infection  . Hypertension   . Meniscus tear   . Multiple rib fractures   . Personal history of diabetic foot ulcer, Left Foot     Past Surgical History:  Procedure Laterality Date  . EYE SURGERY    . I&D EXTREMITY  04/23/2012   Procedure: IRRIGATION AND DEBRIDEMENT EXTREMITY;  Surgeon: Newt Minion, MD;  Location: [place];  Service: Orthopedics;  Laterality: Left;  Irrigation and Debridement Left Foot  . I&D EXTREMITY  09/18/2012   Procedure: IRRIGATION AND DEBRIDEMENT EXTREMITY;  Surgeon: Newt Minion, MD;  Location: [place];  Service: Orthopedics;  Laterality: Left;  . MENISCUS REPAIR Left 04/2016   Orthopedist, Dr [name] in [place], [place]    There were no vitals filed for this visit.      Subjective Assessment - 07/30/16 1508    Subjective " I just returned from the Md due to having an infection in the L foot and recieved and script to continue with PT.    Currently in Pain? Yes   Pain Score 6    Pain Location Knee   Pain Orientation Left   Pain Type Chronic pain   Pain Onset More than a month ago   Pain Frequency Intermittent    Aggravating Factors  walking/ standing   Pain Score 3   Pain Location Foot   Pain Type Acute pain            [facility] PT Assessment - 07/30/16 0001      AROM   Left Knee Extension -8  ERP    Left Knee Flexion 126  soreness note at end range     PROM   Left Knee Extension -6   Left Knee Flexion 128     Strength   Left Hip Flexion 4-/5   Left Hip Extension 3/5   Left Hip ABduction 3+/5   Left Knee Flexion 4-/5   Left Knee Extension 4-/5  soreness in the back of the during testing     Palpation   Palpation comment soreness in mid calf muscle belly, and quad and mid hamstring, soreness with tighness along the adductor longus on the L                     [facility] Adult PT Treatment/Exercise - 07/30/16 1521      Knee/Hip Exercises: Stretches   Active Hamstring Stretch 2 reps;30 seconds   Other Knee/Hip Stretches slant board stretch 2 x 30    Other  Knee/Hip Stretches adductor stretch 2 x 30 sec hold     Knee/Hip Exercises: Aerobic   Nustep L5 x 6 min LE only     Knee/Hip Exercises: Standing   Forward Step Up 2 sets;Both;Step Height: 4";Hand Hold: 1   Step Down 2 sets;Step Height: 4";10 reps;Both     Modalities   Modalities Cryotherapy     Cryotherapy   Number Minutes Cryotherapy 10 Minutes   Cryotherapy Location Hip;Other (comment)  addictpr   Type of Cryotherapy Ice pack                PT Education - 07/30/16 1538    Education provided Yes   Education Details updated HEP for adductor stretch with proper form and rationale   Person(s) Educated Patient   Methods Explanation;Verbal cues;Handout   Comprehension Verbalized understanding;Verbal cues required          PT Short Term Goals - 07/30/16 1547      PT SHORT TERM GOAL #1   Title pt will be I with inital HEP (07/23/2016)   Time 3   Period Weeks   Status Achieved     PT SHORT TERM GOAL #2   Title pt will increase L knee strength and bil hip strength to >/= 4-/5 with </= 4/10  pain during testing to promote functional strength for functional mobility (07/23/2016)   Time 3   Period Weeks   Status On-going     PT SHORT TERM GOAL #3   Title pt will be able to walk / stand for >/= 20 min with </= 4/10 pain to promote endurance (07/23/2016)   Time 3   Period Weeks   Status On-going           PT Long Term Goals - 07/02/16 1755      PT LONG TERM GOAL #1   Title pt will be I with all HEP given as of last visit (08/13/2016)   Baseline *   Time 6   Period Weeks   Status New     PT LONG TERM GOAL #2   Title pt will improve L knee and bil hip strength to >/= 4/5 strength with </= 2/10 pain for prolong walking / standing activities (08/13/2016)   Time 6   Period Weeks   Status New     PT LONG TERM GOAL #3   Title pt will increase L knee flexion to >/= 125 degrees and extension to </= -5 degrees with </= 2/10 pain for functional and efficent gait pattern (08/13/2016)   Baseline *   Time 6   Period Weeks   Status New     PT LONG TERM GOAL #4   Title he will be able to walk/ stand >/= 30 min and navigate up/down >/= 15 steps reciprocally with </= 1 HHA to assist with functional mobility and endurance required for potential work related activite and tasks (08/13/2016)   Time 6   Period Weeks   Status New     PT LONG TERM GOAL #5   Title pt will increase his FOTO score to </= 42% limitation to demontrate improvement in function at discharge (08/13/2016)   Time 6   Period Weeks   Status New               Plan - 07/30/16 1544    Clinical Impression Statement [name] reports following a infection in his foot with a new script from his treating MD for clearance to resume  PT. Reassessed mobility which he is improving in L knee ROM but demonstrates soreness in the adductors, gastroc/ and hamstrings. He was able to do exercises but demonstrated significant fatgiue during and following activity. Plan to resume current POC   PT Frequency 2x / week   PT  Duration 6 weeks   PT Treatment/Interventions ADLs/Self Care Home Management;Cryotherapy;Electrical Stimulation;Iontophoresis 4mg /ml Dexamethasone;Moist Heat;Therapeutic activities;Therapeutic exercise;Patient/family education;Passive range of motion;Dry needling;Taping;Manual techniques;Balance training;Gait training;Stair training   PT Next Visit Plan ,hamstring stretching, strengthening of hips/ knee, modalites PRN,    PT Home Exercise Plan sit to stand, hamstring stretching, calf stretching (bent/ straight) sidelying hip abduction, quad set, adductor stretch   Consulted and Agree with Plan of Care Patient      Patient will benefit from skilled therapeutic intervention in order to improve the following deficits and impairments:  Pain, Improper body mechanics, Postural dysfunction, Decreased range of motion, Decreased endurance, Decreased activity tolerance, Hypomobility, Decreased strength, Decreased mobility, Difficulty walking, Increased fascial restricitons  Visit Diagnosis: Chronic pain of left knee  Muscle weakness (generalized)  Stiffness of left knee, not elsewhere classified     Problem List Patient Active Problem List   Diagnosis Date Noted  . Cellulitis of left lower extremity   . Cellulitis 07/15/2016  . Hyperlipidemia 05/28/2016  . Hypertriglyceridemia 05/28/2016  . Diabetic foot ulcers ([place])   . Left leg pain 02/27/2015  . Encounter for chronic pain management 01/30/2015  . Rib pain on left side 01/27/2015  . Injury resulting from fall from height 12/09/2014  . Adjustment disorder with anxiety 12/09/2014  . Broken rib 10/28/2014  . Pain in right hip 12/10/2012  . Erectile dysfunction 10/21/2012  . HTN (hypertension), benign 04/14/2012  . DM (diabetes mellitus), type 2, uncontrolled ([place]) 04/13/2012   Starr Lake PT, DPT, LAT, ATC  07/30/16  3:55 PM      [facility] [address] [place],  [place], [address] Phone: [phone]   Fax:  [phone]  Name: [name] MRN: [id number] Date of Birth: 01-11-1966

## 2016-07-31 ENCOUNTER — Telehealth (INDEPENDENT_AMBULATORY_CARE_PROVIDER_SITE_OTHER): Payer: Self-pay | Admitting: Orthopedic Surgery

## 2016-07-31 NOTE — Telephone Encounter (Signed)
Patient came in this morning regarding his paperwork for being out of work. He needs an updated note if possible stating that he needs to be out of work until further notice. His Callback is [phone]. Thank you

## 2016-08-01 ENCOUNTER — Ambulatory Visit: Payer: Self-pay | Admitting: Physical Therapy

## 2016-08-01 DIAGNOSIS — M25662 Stiffness of left knee, not elsewhere classified: Secondary | ICD-10-CM

## 2016-08-01 DIAGNOSIS — M25562 Pain in left knee: Principal | ICD-10-CM

## 2016-08-01 DIAGNOSIS — G8929 Other chronic pain: Secondary | ICD-10-CM

## 2016-08-01 DIAGNOSIS — M6281 Muscle weakness (generalized): Secondary | ICD-10-CM

## 2016-08-01 NOTE — Therapy (Signed)
[place], [place], [address] Phone: [phone]   Fax:  [phone]  Physical Therapy Treatment  Patient Details  Name: [name] MRN: [id number] Date of Birth: Sep 09, 1966 Referring Provider: Rodell Perna Md  Encounter Date: 08/01/2016      PT End of Session - 08/01/16 1333    Visit Number 4   Number of Visits 13   Date for PT Re-Evaluation 08/13/16   Authorization Type workers comp   PT Start Time 0130   PT Stop Time 0230   PT Time Calculation (min) 60 min      Past Medical History:  Diagnosis Date  . Allergy   . Diabetes mellitus    Type 2 DM, Patient reports DM Type 1 diagnosd age at age 50  . Diabetic foot ulcers ([place])   . Foreign body in left foot    with  infection  . Hypertension   . Meniscus tear   . Multiple rib fractures   . Personal history of diabetic foot ulcer, Left Foot     Past Surgical History:  Procedure Laterality Date  . EYE SURGERY    . I&D EXTREMITY  04/23/2012   Procedure: IRRIGATION AND DEBRIDEMENT EXTREMITY;  Surgeon: Newt Minion, MD;  Location: [place];  Service: Orthopedics;  Laterality: Left;  Irrigation and Debridement Left Foot  . I&D EXTREMITY  09/18/2012   Procedure: IRRIGATION AND DEBRIDEMENT EXTREMITY;  Surgeon: Newt Minion, MD;  Location: [place];  Service: Orthopedics;  Laterality: Left;  . MENISCUS REPAIR Left 04/2016   Orthopedist, Dr [name] in [place], [place]    There were no vitals filed for this visit.      Subjective Assessment - 08/01/16 1337    Subjective Still about a 6/10    Currently in Pain? Yes   Pain Score 6    Pain Location Knee   Pain Orientation Left   Pain Descriptors / Indicators Burning;Throbbing                         [facility] Adult PT Treatment/Exercise - 08/01/16 0001      Knee/Hip Exercises: Stretches   Other Knee/Hip Stretches slant board stretch 2 x 30    Other Knee/Hip Stretches adductor stretch 2  x 30 sec hold     Knee/Hip Exercises: Aerobic   Nustep L5 x 5 min LE only     Knee/Hip Exercises: Standing   Forward Step Up 10 reps   Forward Step Up Limitations increased pain     Knee/Hip Exercises: Seated   Long Arc Quad 1 set;2 sets;10 reps   Long Arc Quad Limitations red band    Hamstring Curl 2 sets;15 reps     Knee/Hip Exercises: Supine   Bridges Limitations x10 with DF   Straight Leg Raises Left;10 reps   Straight Leg Raise with External Rotation Left;10 reps     Knee/Hip Exercises: Sidelying   Hip ABduction Left;2 sets;10 reps     Knee/Hip Exercises: Prone   Hip Extension 2 sets;10 reps     Cryotherapy   Number Minutes Cryotherapy 15 Minutes   Cryotherapy Location Knee   Type of Cryotherapy Ice pack                  PT Short Term Goals - 07/30/16 1547      PT SHORT TERM GOAL #1   Title pt will be I with inital HEP (48/17/2017)  Time 3   Period Weeks   Status Achieved     PT SHORT TERM GOAL #2   Title pt will increase L knee strength and bil hip strength to >/= 4-/5 with </= 4/10 pain during testing to promote functional strength for functional mobility (48/17/2017)   Time 3   Period Weeks   Status On-going     PT SHORT TERM GOAL #3   Title pt will be able to walk / stand for >/= 20 min with </= 4/10 pain to promote endurance (07/23/2016)   Time 3   Period Weeks   Status On-going           PT Long Term Goals - 07/02/16 1755      PT LONG TERM GOAL #1   Title pt will be I with all HEP given as of last visit (08/13/2016)   Baseline *   Time 6   Period Weeks   Status New     PT LONG TERM GOAL #2   Title pt will improve L knee and bil hip strength to >/= 4/5 strength with </= 2/10 pain for prolong walking / standing activities (08/13/2016)   Time 6   Period Weeks   Status New     PT LONG TERM GOAL #3   Title pt will increase L knee flexion to >/= 125 degrees and extension to </= -5 degrees with </= 2/10 pain for functional and  efficent gait pattern (08/13/2016)   Baseline *   Time 6   Period Weeks   Status New     PT LONG TERM GOAL #4   Title he will be able to walk/ stand >/= 30 min and navigate up/down >/= 15 steps reciprocally with </= 1 HHA to assist with functional mobility and endurance required for potential work related activite and tasks (08/13/2016)   Time 6   Period Weeks   Status New     PT LONG TERM GOAL #5   Title pt will increase his FOTO score to </= 42% limitation to demontrate improvement in function at discharge (08/13/2016)   Time 6   Period Weeks   Status New               Plan - 08/01/16 1334    Clinical Impression Statement Increased swelling and pain after 10-15 minutes of standing and walking. Pt reports adductor strain is improving, ice and stretch were helpful last visit. Focused hip and knee strengthening with good tolerance. Some increased pain medial knee as he fatigues.    PT Next Visit Plan ,hamstring stretching, strengthening of hips/ knee, modalites PRN,    PT Home Exercise Plan sit to stand, hamstring stretching, calf stretching (bent/ straight) sidelying hip abduction, quad set, adductor stretch      Patient will benefit from skilled therapeutic intervention in order to improve the following deficits and impairments:  Pain, Improper body mechanics, Postural dysfunction, Decreased range of motion, Decreased endurance, Decreased activity tolerance, Hypomobility, Decreased strength, Decreased mobility, Difficulty walking, Increased fascial restricitons  Visit Diagnosis: Chronic pain of left knee  Muscle weakness (generalized)  Stiffness of left knee, not elsewhere classified     Problem List Patient Active Problem List   Diagnosis Date Noted  . Cellulitis of left lower extremity   . Cellulitis 07/15/2016  . Hyperlipidemia 05/28/2016  . Hypertriglyceridemia 05/28/2016  . Diabetic foot ulcers ([place])   . Left leg pain 02/27/2015  . Encounter for chronic pain  management 01/30/2015  . Rib  pain on left side 01/27/2015  . Injury resulting from fall from height 12/09/2014  . Adjustment disorder with anxiety 12/09/2014  . Broken rib 10/28/2014  . Pain in right hip 12/10/2012  . Erectile dysfunction 10/21/2012  . HTN (hypertension), benign 04/14/2012  . DM (diabetes mellitus), type 2, uncontrolled ([place]) 04/13/2012    Dorene Ar, PTA 08/01/2016, 3:23 PM  [facility] [address] [place], [place], [address] Phone: [phone]   Fax:  [phone]  Name: Muhamed Luecke MRN: [id number] Date of Birth: [birth year]

## 2016-08-07 NOTE — Telephone Encounter (Signed)
I called discussed. Set up FCE.  Fix work note OOW pending FCE review visit with Dr. Lorin Mercy. thanks

## 2016-08-07 NOTE — Telephone Encounter (Signed)
Please see attached. OK for note remaining out of work?

## 2016-08-13 ENCOUNTER — Ambulatory Visit: Payer: Self-pay | Attending: Orthopaedic Surgery | Admitting: Physical Therapy

## 2016-08-13 DIAGNOSIS — M25662 Stiffness of left knee, not elsewhere classified: Secondary | ICD-10-CM | POA: Insufficient documentation

## 2016-08-13 DIAGNOSIS — M6281 Muscle weakness (generalized): Secondary | ICD-10-CM | POA: Insufficient documentation

## 2016-08-13 DIAGNOSIS — G8929 Other chronic pain: Secondary | ICD-10-CM | POA: Insufficient documentation

## 2016-08-13 DIAGNOSIS — M25562 Pain in left knee: Secondary | ICD-10-CM | POA: Insufficient documentation

## 2016-08-13 NOTE — Therapy (Signed)
[place], [place], [address] Phone: [phone]   Fax:  [phone]  Physical Therapy Treatment/ Re-certification   Patient Details  Name: [name] MRN: [id number] Date of Birth: 08/21/1966 Referring Provider: Rodell Perna Md  Encounter Date: 08/13/2016      PT End of Session - 08/13/16 1531    Visit Number 5   Number of Visits 13   Date for PT Re-Evaluation 09/10/16   Authorization Type workers comp   PT Start Time 1500   PT Stop Time 0630   PT Time Calculation (min) 44 min   Activity Tolerance Patient tolerated treatment well   Behavior During Therapy [facility] for tasks assessed/performed      Past Medical History:  Diagnosis Date  . Allergy   . Diabetes mellitus    Type 2 DM, Patient reports DM Type 1 diagnosd age at age 35  . Diabetic foot ulcers ([place])   . Foreign body in left foot    with  infection  . Hypertension   . Meniscus tear   . Multiple rib fractures   . Personal history of diabetic foot ulcer, Left Foot     Past Surgical History:  Procedure Laterality Date  . EYE SURGERY    . I&D EXTREMITY  04/23/2012   Procedure: IRRIGATION AND DEBRIDEMENT EXTREMITY;  Surgeon: Newt Minion, MD;  Location: [place];  Service: Orthopedics;  Laterality: Left;  Irrigation and Debridement Left Foot  . I&D EXTREMITY  09/18/2012   Procedure: IRRIGATION AND DEBRIDEMENT EXTREMITY;  Surgeon: Newt Minion, MD;  Location: [place];  Service: Orthopedics;  Laterality: Left;  . MENISCUS REPAIR Left 04/2016   Orthopedist, Dr [name] in [place], [place]    There were no vitals filed for this visit.      Subjective Assessment - 08/13/16 1501    Subjective "doing pretty good, some soreness some related to weather, but also due to trying to dance over the weekend"    Currently in Pain? Yes   Pain Score 7    Pain Location Knee   Pain Orientation Left   Pain Descriptors / Indicators Burning;Throbbing   Pain  Type Chronic pain   Pain Onset More than a month ago   Pain Frequency Intermittent   Aggravating Factors  walking/ standing/ weather   Pain Relieving Factors Naproxen, ice            [facility] PT Assessment - 08/13/16 1511      Observation/Other Assessments   Focus on Therapeutic Outcomes (FOTO)  63% limited     AROM   Left Knee Extension -4   Left Knee Flexion 126                     [facility] Adult PT Treatment/Exercise - 08/13/16 1507      Knee/Hip Exercises: Aerobic   Nustep L5 x 6 min LE only     Knee/Hip Exercises: Seated   Long Arc Quad 2 sets;10 reps;Both  alternating   Long Arc Quad Weight 3 lbs.   Sit to Sand 1 set;10 reps  with red therband wrapped around knees     Knee/Hip Exercises: Supine   Other Supine Knee/Hip Exercises clamshells 1 x 10 bil, 1 x 10 focusing on LLE only , 1 x 10 RLE only                  PT Short Term Goals - 08/13/16 1653  PT SHORT TERM GOAL #1   Title pt will be I with inital HEP (07/23/2016)   Time 3   Period Weeks   Status Achieved     PT SHORT TERM GOAL #2   Title pt will increase L knee strength and bil hip strength to >/= 4-/5 with </= 4/10 pain during testing to promote functional strength for functional mobility (07/23/2016)   Time 3   Period Weeks   Status On-going     PT SHORT TERM GOAL #3   Title pt will be able to walk / stand for >/= 20 min with </= 4/10 pain to promote endurance (07/23/2016)   Time 3   Period Weeks   Status On-going           PT Long Term Goals - 07/02/16 1755      PT LONG TERM GOAL #1   Title pt will be I with all HEP given as of last visit (08/13/2016)   Baseline *   Time 6   Period Weeks   Status New     PT LONG TERM GOAL #2   Title pt will improve L knee and bil hip strength to >/= 4/5 strength with </= 2/10 pain for prolong walking / standing activities (08/13/2016)   Time 6   Period Weeks   Status New     PT LONG TERM GOAL #3   Title pt will increase L  knee flexion to >/= 125 degrees and extension to </= -5 degrees with </= 2/10 pain for functional and efficent gait pattern (08/13/2016)   Baseline *   Time 6   Period Weeks   Status New     PT LONG TERM GOAL #4   Title he will be able to walk/ stand >/= 30 min and navigate up/down >/= 15 steps reciprocally with </= 1 HHA to assist with functional mobility and endurance required for potential work related activite and tasks (08/13/2016)   Time 6   Period Weeks   Status New     PT LONG TERM GOAL #5   Title pt will increase his FOTO score to </= 42% limitation to demontrate improvement in function at discharge (08/13/2016)   Time 6   Period Weeks   Status New               Plan - 08/13/16 1650    Clinical Impression Statement pt demonstrates improvement with ROM in the L knee extension and flexion. continued working on the hip strengthening, which pt is able to do but fatigues pretty quickly.    Rehab Potential Good   PT Frequency 2x / week   PT Duration 4 weeks   PT Next Visit Plan ,hamstring stretching, progress strengthening of hips/ knee, modalites PRN,   Consulted and Agree with Plan of Care Patient      Patient will benefit from skilled therapeutic intervention in order to improve the following deficits and impairments:  Pain, Improper body mechanics, Postural dysfunction, Decreased range of motion, Decreased endurance, Decreased activity tolerance, Hypomobility, Decreased strength, Decreased mobility, Difficulty walking, Increased fascial restricitons  Visit Diagnosis: Chronic pain of left knee - Plan: PT plan of care cert/re-cert  Muscle weakness (generalized) - Plan: PT plan of care cert/re-cert  Stiffness of left knee, not elsewhere classified - Plan: PT plan of care cert/re-cert     Problem List Patient Active Problem List   Diagnosis Date Noted  . Cellulitis of left lower extremity   . Cellulitis 07/15/2016  .  Hyperlipidemia 05/28/2016  .  Hypertriglyceridemia 05/28/2016  . Diabetic foot ulcers ([place])   . Left leg pain 02/27/2015  . Encounter for chronic pain management 01/30/2015  . Rib pain on left side 01/27/2015  . Injury resulting from fall from height 12/09/2014  . Adjustment disorder with anxiety 12/09/2014  . Broken rib 10/28/2014  . Pain in right hip 12/10/2012  . Erectile dysfunction 10/21/2012  . HTN (hypertension), benign 04/14/2012  . DM (diabetes mellitus), type 2, uncontrolled ([place]) 04/13/2012   Starr Lake PT, DPT, LAT, ATC  08/13/16  4:57 PM      [facility] [address] [place], [place], [address] Phone: [phone]   Fax:  [phone]  Name: [name] MRN: [id number] Date of Birth: 11-14-1965

## 2016-08-15 ENCOUNTER — Ambulatory Visit: Payer: Self-pay | Admitting: Physical Therapy

## 2016-08-15 DIAGNOSIS — G8929 Other chronic pain: Secondary | ICD-10-CM

## 2016-08-15 DIAGNOSIS — M25662 Stiffness of left knee, not elsewhere classified: Secondary | ICD-10-CM

## 2016-08-15 DIAGNOSIS — M6281 Muscle weakness (generalized): Secondary | ICD-10-CM

## 2016-08-15 DIAGNOSIS — M25562 Pain in left knee: Principal | ICD-10-CM

## 2016-08-15 NOTE — Therapy (Signed)
[place], [place], [address] Phone: [phone]   Fax:  [phone]  Physical Therapy Treatment  Patient Details  Name: [name] MRN: [id number] Date of Birth: [birth year] Referring Provider: Rodell Perna Md  Encounter Date: 08/15/2016      PT End of Session - 08/15/16 1551    Visit Number 6   Number of Visits 13   Date for PT Re-Evaluation 09/10/16   Authorization Type workers comp   PT Start Time 0300   PT Stop Time 0349   PT Time Calculation (min) 49 min   Activity Tolerance Patient tolerated treatment well   Behavior During Therapy [facility] for tasks assessed/performed      Past Medical History:  Diagnosis Date  . Allergy   . Diabetes mellitus    Type 2 DM, Patient reports DM Type 1 diagnosd age at age 18  . Diabetic foot ulcers ([place])   . Foreign body in left foot    with  infection  . Hypertension   . Meniscus tear   . Multiple rib fractures   . Personal history of diabetic foot ulcer, Left Foot     Past Surgical History:  Procedure Laterality Date  . EYE SURGERY    . I&D EXTREMITY  04/23/2012   Procedure: IRRIGATION AND DEBRIDEMENT EXTREMITY;  Surgeon: Newt Minion, MD;  Location: [place];  Service: Orthopedics;  Laterality: Left;  Irrigation and Debridement Left Foot  . I&D EXTREMITY  09/18/2012   Procedure: IRRIGATION AND DEBRIDEMENT EXTREMITY;  Surgeon: Newt Minion, MD;  Location: [place];  Service: Orthopedics;  Laterality: Left;  . MENISCUS REPAIR Left 04/2016   Orthopedist, Dr [name] in [place], [place]    There were no vitals filed for this visit.      Subjective Assessment - 08/15/16 1505    Subjective "I was alittle sore after the last session"    Currently in Pain? Yes   Pain Score 5    Pain Location Knee   Pain Orientation Left   Pain Descriptors / Indicators Aching;Burning   Pain Type Chronic pain   Pain Onset More than a month ago   Pain Frequency Intermittent                          [facility] Adult PT Treatment/Exercise - 08/15/16 0001      Knee/Hip Exercises: Aerobic   Stationary Bike L2 x 9 min   Nustep --     Knee/Hip Exercises: Seated   Long Arc Quad 2 sets;10 reps;Both  alternating   Long Arc Quad Weight 3 lbs.     Knee/Hip Exercises: Supine   Bridges Limitations x10 with DF   Other Supine Knee/Hip Exercises clamshells 1 x 10 bil, 1 x 10 focusing on LLE only , 1 x 10 RLE only     Moist Heat Therapy   Number Minutes Moist Heat 10 Minutes   Moist Heat Location Knee                  PT Short Term Goals - 08/13/16 1653      PT SHORT TERM GOAL #1   Title pt will be I with inital HEP (07/23/2016)   Time 3   Period Weeks   Status Achieved     PT SHORT TERM GOAL #2   Title pt will increase L knee strength and bil hip strength to >/= 4-/5 with </= 4/10 pain  during testing to promote functional strength for functional mobility (07/23/2016)   Time 3   Period Weeks   Status On-going     PT SHORT TERM GOAL #3   Title pt will be able to walk / stand for >/= 20 min with </= 4/10 pain to promote endurance (07/23/2016)   Time 3   Period Weeks   Status On-going           PT Long Term Goals - 07/02/16 1755      PT LONG TERM GOAL #1   Title pt will be I with all HEP given as of last visit (08/13/2016)   Baseline *   Time 6   Period Weeks   Status New     PT LONG TERM GOAL #2   Title pt will improve L knee and bil hip strength to >/= 4/5 strength with </= 2/10 pain for prolong walking / standing activities (08/13/2016)   Time 6   Period Weeks   Status New     PT LONG TERM GOAL #3   Title pt will increase L knee flexion to >/= 125 degrees and extension to </= -5 degrees with </= 2/10 pain for functional and efficent gait pattern (08/13/2016)   Baseline *   Time 6   Period Weeks   Status New     PT LONG TERM GOAL #4   Title he will be able to walk/ stand >/= 30 min and navigate up/down >/= 15 steps  reciprocally with </= 1 HHA to assist with functional mobility and endurance required for potential work related activite and tasks (08/13/2016)   Time 6   Period Weeks   Status New     PT LONG TERM GOAL #5   Title pt will increase his FOTO score to </= 42% limitation to demontrate improvement in function at discharge (08/13/2016)   Time 6   Period Weeks   Status New               Plan - 08/15/16 1549    Clinical Impression Statement pt continues to progress with physical therapy improving mobility and strengthening but continues to fatigue quickly with prolong exercise and activity.    PT Next Visit Plan ,hamstring stretching, progress strengthening of hips/ knee, modalites PRN,   Consulted and Agree with Plan of Care Patient      Patient will benefit from skilled therapeutic intervention in order to improve the following deficits and impairments:  Pain, Improper body mechanics, Postural dysfunction, Decreased range of motion, Decreased endurance, Decreased activity tolerance, Hypomobility, Decreased strength, Decreased mobility, Difficulty walking, Increased fascial restricitons  Visit Diagnosis: Chronic pain of left knee  Muscle weakness (generalized)  Stiffness of left knee, not elsewhere classified     Problem List Patient Active Problem List   Diagnosis Date Noted  . Cellulitis of left lower extremity   . Cellulitis 07/15/2016  . Hyperlipidemia 05/28/2016  . Hypertriglyceridemia 05/28/2016  . Diabetic foot ulcers ([place])   . Left leg pain 02/27/2015  . Encounter for chronic pain management 01/30/2015  . Rib pain on left side 01/27/2015  . Injury resulting from fall from height 12/09/2014  . Adjustment disorder with anxiety 12/09/2014  . Broken rib 10/28/2014  . Pain in right hip 12/10/2012  . Erectile dysfunction 10/21/2012  . HTN (hypertension), benign 04/14/2012  . DM (diabetes mellitus), type 2, uncontrolled ([place]) 04/13/2012   Tatisha Cerino PT, DPT,  LAT, ATC  08/15/16  3:52 PM  [place], [place], [address] Phone: [phone]   Fax:  [phone]  Name: [name] MRN: [id number] Date of Birth: 14-Dec-1965

## 2016-08-20 ENCOUNTER — Ambulatory Visit: Payer: Self-pay | Admitting: Physical Therapy

## 2016-08-20 DIAGNOSIS — M6281 Muscle weakness (generalized): Secondary | ICD-10-CM

## 2016-08-20 DIAGNOSIS — M25562 Pain in left knee: Principal | ICD-10-CM

## 2016-08-20 DIAGNOSIS — G8929 Other chronic pain: Secondary | ICD-10-CM

## 2016-08-20 DIAGNOSIS — M25662 Stiffness of left knee, not elsewhere classified: Secondary | ICD-10-CM

## 2016-08-20 NOTE — Therapy (Signed)
[place], [place], [address] Phone: [phone]   Fax:  [phone]  Physical Therapy Treatment  Patient Details  Name: [name] MRN: [id number] Date of Birth: [birth year] Referring Provider: Rodell Perna Md  Encounter Date: 08/20/2016      PT End of Session - 08/20/16 1513    Visit Number 7   Number of Visits 13   Date for PT Re-Evaluation 09/10/16   Authorization Type workers comp   PT Start Time 1500   PT Stop Time 1554   PT Time Calculation (min) 54 min   Activity Tolerance Patient tolerated treatment well   Behavior During Therapy [facility] for tasks assessed/performed      Past Medical History:  Diagnosis Date  . Allergy   . Diabetes mellitus    Type 2 DM, Patient reports DM Type 1 diagnosd age at age 43  . Diabetic foot ulcers ([place])   . Foreign body in left foot    with  infection  . Hypertension   . Meniscus tear   . Multiple rib fractures   . Personal history of diabetic foot ulcer, Left Foot     Past Surgical History:  Procedure Laterality Date  . EYE SURGERY    . I&D EXTREMITY  04/23/2012   Procedure: IRRIGATION AND DEBRIDEMENT EXTREMITY;  Surgeon: Newt Minion, MD;  Location: [place];  Service: Orthopedics;  Laterality: Left;  Irrigation and Debridement Left Foot  . I&D EXTREMITY  09/18/2012   Procedure: IRRIGATION AND DEBRIDEMENT EXTREMITY;  Surgeon: Newt Minion, MD;  Location: [facility];  Service: Orthopedics;  Laterality: Left;  . MENISCUS REPAIR Left 04/2016   Orthopedist, Dr [name] in [place], [place]    There were no vitals filed for this visit.      Subjective Assessment - 08/20/16 1505    Subjective "slowly getting better , pain is still about a 4-5/10 today"    Currently in Pain? Yes   Pain Score 5    Pain Location Knee   Pain Orientation Right   Pain Descriptors / Indicators Aching;Burning   Aggravating Factors  walking/ standing/ going up/ down stairs                          [facility] Adult PT Treatment/Exercise - 08/20/16 0001      Knee/Hip Exercises: Stretches   Active Hamstring Stretch 2 reps;30 seconds   Other Knee/Hip Stretches slant board stretch 2 x 30      Knee/Hip Exercises: Aerobic   Stationary Bike L3 x 4 min     Knee/Hip Exercises: Standing   Hip Abduction Stengthening;Both;2 sets;10 reps;Knee straight  verbal cues to keep knee straight   Hip Extension Stengthening;Both;2 sets;10 reps;Knee straight  verbal cues for form     Knee/Hip Exercises: Seated   Long Arc Quad Strengthening;Both;2 sets;10 reps   Long Arc Quad Weight 4 lbs.   Sit to Sand 1 set;10 reps  with glute contraction in standing     Cryotherapy   Number Minutes Cryotherapy 10 Minutes   Cryotherapy Location Knee   Type of Cryotherapy Ice pack                PT Education - 08/20/16 1606    Education provided Yes   Education Details updated HEP with proper treatment/ rationale   Person(s) Educated Patient   Methods Explanation;Verbal cues;Handout   Comprehension Verbalized understanding;Verbal cues required  PT Short Term Goals - 08/13/16 1653      PT SHORT TERM GOAL #1   Title pt will be I with inital HEP (07/23/2016)   Time 3   Period Weeks   Status Achieved     PT SHORT TERM GOAL #2   Title pt will increase L knee strength and bil hip strength to >/= 4-/5 with </= 4/10 pain during testing to promote functional strength for functional mobility (07/23/2016)   Time 3   Period Weeks   Status On-going     PT SHORT TERM GOAL #3   Title pt will be able to walk / stand for >/= 20 min with </= 4/10 pain to promote endurance (07/23/2016)   Time 3   Period Weeks   Status On-going           PT Long Term Goals - 07/02/16 1755      PT LONG TERM GOAL #1   Title pt will be I with all HEP given as of last visit (08/13/2016)   Baseline *   Time 6   Period Weeks   Status New     PT LONG TERM GOAL #2   Title  pt will improve L knee and bil hip strength to >/= 4/5 strength with </= 2/10 pain for prolong walking / standing activities (08/13/2016)   Time 6   Period Weeks   Status New     PT LONG TERM GOAL #3   Title pt will increase L knee flexion to >/= 125 degrees and extension to </= -5 degrees with </= 2/10 pain for functional and efficent gait pattern (08/13/2016)   Baseline *   Time 6   Period Weeks   Status New     PT LONG TERM GOAL #4   Title he will be able to walk/ stand >/= 30 min and navigate up/down >/= 15 steps reciprocally with </= 1 HHA to assist with functional mobility and endurance required for potential work related activite and tasks (08/13/2016)   Time 6   Period Weeks   Status New     PT LONG TERM GOAL #5   Title pt will increase his FOTO score to </= 42% limitation to demontrate improvement in function at discharge (08/13/2016)   Time 6   Period Weeks   Status New               Plan - 08/20/16 1607    Clinical Impression Statement continued to work on hip/ knee strengtheing progressing into standing which pt performed will with mild to moderate fatigue in standing. continued ice pack post session for mucle soreness.    PT Next Visit Plan ,hamstring stretching, progress strengthening of hips/ knee, modalites PRN,   PT Home Exercise Plan sit to stand, hamstring stretching, calf stretching (bent/ straight) sidelying hip abduction, quad set, adductor stretch, LAQ with theraband, standing hp extension/ abduction   Consulted and Agree with Plan of Care Patient      Patient will benefit from skilled therapeutic intervention in order to improve the following deficits and impairments:  Pain, Improper body mechanics, Postural dysfunction, Decreased range of motion, Decreased endurance, Decreased activity tolerance, Hypomobility, Decreased strength, Decreased mobility, Difficulty walking, Increased fascial restricitons  Visit Diagnosis: Chronic pain of left knee  Muscle  weakness (generalized)  Stiffness of left knee, not elsewhere classified     Problem List Patient Active Problem List   Diagnosis Date Noted  . Cellulitis of left lower extremity   .  Cellulitis 07/15/2016  . Hyperlipidemia 05/28/2016  . Hypertriglyceridemia 05/28/2016  . Diabetic foot ulcers ([place])   . Left leg pain 02/27/2015  . Encounter for chronic pain management 01/30/2015  . Rib pain on left side 01/27/2015  . Injury resulting from fall from height 12/09/2014  . Adjustment disorder with anxiety 12/09/2014  . Broken rib 10/28/2014  . Pain in right hip 12/10/2012  . Erectile dysfunction 10/21/2012  . HTN (hypertension), benign 04/14/2012  . DM (diabetes mellitus), type 2, uncontrolled ([place]) 04/13/2012   Starr Lake PT, DPT, LAT, ATC  08/20/16  4:13 PM   [facility] [address] [place], [place], [address] Phone: [phone]   Fax:  [phone]  Name: [name] MRN: [id number] Date of Birth: 05/05/1966

## 2016-08-22 ENCOUNTER — Ambulatory Visit: Payer: Self-pay | Admitting: Physical Therapy

## 2016-08-22 DIAGNOSIS — M25562 Pain in left knee: Principal | ICD-10-CM

## 2016-08-22 DIAGNOSIS — M25662 Stiffness of left knee, not elsewhere classified: Secondary | ICD-10-CM

## 2016-08-22 DIAGNOSIS — M6281 Muscle weakness (generalized): Secondary | ICD-10-CM

## 2016-08-22 DIAGNOSIS — G8929 Other chronic pain: Secondary | ICD-10-CM

## 2016-08-22 NOTE — Therapy (Signed)
[name], [place], [address] Phone: [phone]   Fax:  [phone]  Physical Therapy Treatment  Patient Details  Name: [name] MRN: [id number] Date of Birth: 02-Jul-1966 Referring Provider: Rodell Perna Md  Encounter Date: 08/22/2016      PT End of Session - 08/22/16 1541    Visit Number 8   Number of Visits 13   Date for PT Re-Evaluation 09/10/16   PT Start Time 1500   PT Stop Time 1550   PT Time Calculation (min) 50 min   Activity Tolerance Patient tolerated treatment well   Behavior During Therapy [facility] for tasks assessed/performed      Past Medical History:  Diagnosis Date  . Allergy   . Diabetes mellitus    Type 2 DM, Patient reports DM Type 1 diagnosd age at age 34  . Diabetic foot ulcers ([place])   . Foreign body in left foot    with  infection  . Hypertension   . Meniscus tear   . Multiple rib fractures   . Personal history of diabetic foot ulcer, Left Foot     Past Surgical History:  Procedure Laterality Date  . EYE SURGERY    . I&D EXTREMITY  04/23/2012   Procedure: IRRIGATION AND DEBRIDEMENT EXTREMITY;  Surgeon: Newt Minion, MD;  Location: [place];  Service: Orthopedics;  Laterality: Left;  Irrigation and Debridement Left Foot  . I&D EXTREMITY  09/18/2012   Procedure: IRRIGATION AND DEBRIDEMENT EXTREMITY;  Surgeon: Newt Minion, MD;  Location: [place];  Service: Orthopedics;  Laterality: Left;  . MENISCUS REPAIR Left 04/2016   Orthopedist, Dr [name] in [place], [place]    There were no vitals filed for this visit.      Subjective Assessment - 08/22/16 1504    Subjective "I am feeling alittle off today and I am having more soreness at 6/10"   Currently in Pain? Yes   Pain Score 6    Pain Location Knee   Pain Orientation Right   Pain Descriptors / Indicators Aching;Sore   Pain Type Chronic pain   Pain Onset More than a month ago   Pain Frequency Intermittent                          [facility] Adult PT Treatment/Exercise - 08/22/16 0001      Knee/Hip Exercises: Stretches   Active Hamstring Stretch 2 reps;30 seconds  bil peroformed    Gastroc Stretch Left;2 reps;30 seconds  with strap, performed bil     Knee/Hip Exercises: Aerobic   Stationary Bike L2 x 6 min     Knee/Hip Exercises: Standing   Heel Raises Both;5 reps;1 set  halted after 4 reps due to foot pain   Wall Squat 2 sets;10 reps     Knee/Hip Exercises: Seated   Long Arc Quad Strengthening;Both;2 sets;10 reps;Weights   Long Arc Quad Weight 4 lbs.   Marching Limitations 2 x 10   Marching Weights 4 lbs.   Sit to Sand 1 set;10 reps  controlled eccentrics             Balance Exercises - 08/22/16 1542      Balance Exercises: Standing   Standing Eyes Opened Narrow base of support (BOS);2 reps;30 secs  moderate postural sway              PT Short Term Goals - 08/13/16 1653      PT  SHORT TERM GOAL #1   Title pt will be I with inital HEP (07/23/2016)   Time 3   Period Weeks   Status Achieved     PT SHORT TERM GOAL #2   Title pt will increase L knee strength and bil hip strength to >/= 4-/5 with </= 4/10 pain during testing to promote functional strength for functional mobility (07/23/2016)   Time 3   Period Weeks   Status On-going     PT SHORT TERM GOAL #3   Title pt will be able to walk / stand for >/= 20 min with </= 4/10 pain to promote endurance (07/23/2016)   Time 3   Period Weeks   Status On-going           PT Long Term Goals - 07/02/16 1755      PT LONG TERM GOAL #1   Title pt will be I with all HEP given as of last visit (08/13/2016)   Baseline *   Time 6   Period Weeks   Status New     PT LONG TERM GOAL #2   Title pt will improve L knee and bil hip strength to >/= 4/5 strength with </= 2/10 pain for prolong walking / standing activities (08/13/2016)   Time 6   Period Weeks   Status New     PT LONG TERM GOAL #3   Title pt  will increase L knee flexion to >/= 125 degrees and extension to </= -5 degrees with </= 2/10 pain for functional and efficent gait pattern (08/13/2016)   Baseline *   Time 6   Period Weeks   Status New     PT LONG TERM GOAL #4   Title he will be able to walk/ stand >/= 30 min and navigate up/down >/= 15 steps reciprocally with </= 1 HHA to assist with functional mobility and endurance required for potential work related activite and tasks (08/13/2016)   Time 6   Period Weeks   Status New     PT LONG TERM GOAL #5   Title pt will increase his FOTO score to </= 42% limitation to demontrate improvement in function at discharge (08/13/2016)   Time 6   Period Weeks   Status New               Plan - 08/22/16 1541    Clinical Impression Statement continued working on hip/ knee strengthening progressing to CKC activity and progressing with endurance with increased reps. attempted heel raises but pt reported increased foot pain so halted activity. He does exhibit increased fatigue with exercise but is progressing.    PT Next Visit Plan ,hamstring stretching, progress strengthening of hips/ knee, modalites PRN,   PT Home Exercise Plan sit to stand, hamstring stretching, calf stretching (bent/ straight) sidelying hip abduction, quad set, adductor stretch, LAQ with theraband, standing hp extension/ abduction   Consulted and Agree with Plan of Care Patient      Patient will benefit from skilled therapeutic intervention in order to improve the following deficits and impairments:  Pain, Improper body mechanics, Postural dysfunction, Decreased range of motion, Decreased endurance, Decreased activity tolerance, Hypomobility, Decreased strength, Decreased mobility, Difficulty walking, Increased fascial restricitons  Visit Diagnosis: Chronic pain of left knee  Muscle weakness (generalized)  Stiffness of left knee, not elsewhere classified     Problem List Patient Active Problem List    Diagnosis Date Noted  . Cellulitis of left lower extremity   . Cellulitis  07/15/2016  . Hyperlipidemia 05/28/2016  . Hypertriglyceridemia 05/28/2016  . Diabetic foot ulcers ([place])   . Left leg pain 02/27/2015  . Encounter for chronic pain management 01/30/2015  . Rib pain on left side 01/27/2015  . Injury resulting from fall from height 12/09/2014  . Adjustment disorder with anxiety 12/09/2014  . Broken rib 10/28/2014  . Pain in right hip 12/10/2012  . Erectile dysfunction 10/21/2012  . HTN (hypertension), benign 04/14/2012  . DM (diabetes mellitus), type 2, uncontrolled ([place]) 04/13/2012    Starr Lake 08/22/2016, 3:46 PM  [facility] [address] [place], [place], [address] Phone: [phone]   Fax:  [phone]  Name: Harve Spradley MRN: [id number] Date of Birth: 13-Jul-1966

## 2016-08-26 ENCOUNTER — Ambulatory Visit: Payer: Self-pay | Admitting: Physical Therapy

## 2016-08-27 ENCOUNTER — Ambulatory Visit: Payer: Self-pay | Admitting: Physical Therapy

## 2016-08-27 ENCOUNTER — Ambulatory Visit (INDEPENDENT_AMBULATORY_CARE_PROVIDER_SITE_OTHER): Payer: Worker's Compensation | Admitting: Orthopedic Surgery

## 2016-08-27 DIAGNOSIS — M25662 Stiffness of left knee, not elsewhere classified: Secondary | ICD-10-CM

## 2016-08-27 DIAGNOSIS — E09621 Drug or chemical induced diabetes mellitus with foot ulcer: Secondary | ICD-10-CM | POA: Diagnosis not present

## 2016-08-27 DIAGNOSIS — M25562 Pain in left knee: Secondary | ICD-10-CM

## 2016-08-27 DIAGNOSIS — G8929 Other chronic pain: Secondary | ICD-10-CM

## 2016-08-27 DIAGNOSIS — E1165 Type 2 diabetes mellitus with hyperglycemia: Secondary | ICD-10-CM

## 2016-08-27 DIAGNOSIS — L97529 Non-pressure chronic ulcer of other part of left foot with unspecified severity: Secondary | ICD-10-CM | POA: Diagnosis not present

## 2016-08-27 DIAGNOSIS — E118 Type 2 diabetes mellitus with unspecified complications: Secondary | ICD-10-CM

## 2016-08-27 DIAGNOSIS — M6281 Muscle weakness (generalized): Secondary | ICD-10-CM

## 2016-08-27 NOTE — Progress Notes (Signed)
Office Visit Note   Patient: [name]           Date of Birth: [birth year]           MRN: [id number] Visit Date: 08/27/2016              Requested by: [name], MD [place], [place] [address] PCP: [name], MD   Assessment & Plan: Visit Diagnoses:  1. Uncontrolled type 2 diabetes mellitus with complication, unspecified long term insulin use status ([place])   2. 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 Continue to monitor feet. Follow up in 2 months. Will return sooner if callus build up returns and is painful.  Follow-Up Instructions: Return in about 2 months (around 10/27/2016).   Orders:  No orders of the defined types were placed in this encounter.  No orders of the defined types were placed in this encounter.     Procedures: No procedures performed   Clinical Data: No additional findings.   Subjective: Chief Complaint  Patient presents with  . Left Leg - Follow-up  . Left Foot - Follow-up    Patient here today following up on a Wagner grade I ulcer, 1st metatarsal head. Left heel cord contracture. Patient is doing very well. Ulcer is healing very well, scabbed over.   Patient is going to physical therapy for the right knee. Has been having increased pain beneath the first metatarsal head.     Review of Systems  Constitutional: Negative for chills and fever.  Skin: Negative for wound.  All other systems reviewed and are negative.    Objective: Vital Signs: There were no vitals taken for this visit.  Physical Exam  Constitutional: He is oriented to person, place, and time. He appears well-developed and well-nourished.  Pulmonary/Chest: Effort normal.  Neurological: He is alert and oriented to person, place, and time.  Skin:  Beneath the first metatarsal head  there is hypertrophic callus. This measures 15 mm in diameter. Was pared with a #10 blade knife following informed consent back to viable tissue. No open areas. No drainage, erythema, odor or sign of infection.  Psychiatric: He has a normal mood and affect.  Nursing note reviewed.   Ortho Exam  Specialty Comments:  No specialty comments available.  Imaging: No results found.   PMFS History: Patient Active Problem List   Diagnosis Date Noted  . Cellulitis of left lower extremity   . Cellulitis 07/15/2016  . Hyperlipidemia 05/28/2016  . Hypertriglyceridemia 05/28/2016  . Diabetic foot ulcers ([place])   . Left leg pain 02/27/2015  . Encounter for chronic pain management 01/30/2015  . Rib pain on left side 01/27/2015  . Injury resulting from fall from height 12/09/2014  . Adjustment disorder with anxiety 12/09/2014  . Broken rib 10/28/2014  . Pain in right hip 12/10/2012  . Erectile dysfunction 10/21/2012  . HTN (hypertension), benign 04/14/2012  . DM (diabetes mellitus), type 2, uncontrolled ([place]) 04/13/2012   Past Medical History:  Diagnosis Date  . Allergy   . Diabetes mellitus    Type 2 DM, Patient reports DM Type 1 diagnosd age at age 50  . Diabetic foot ulcers ([place])   . Foreign body in left foot    with  infection  . Hypertension   . Meniscus tear   .  Multiple rib fractures   . Personal history of diabetic foot ulcer, Left Foot     Family History  Problem Relation Age of Onset  . Diabetes type II Mother   . Arthritis Mother   . Diabetes Mother   . Hyperlipidemia Mother   . Hypertension Mother   . Diabetes type II Sister   . Diabetes Father     Past Surgical History:  Procedure Laterality Date  . EYE SURGERY    . I&D EXTREMITY  04/23/2012   Procedure: IRRIGATION AND DEBRIDEMENT EXTREMITY;  Surgeon: Newt Minion, MD;  Location: [facility];  Service: Orthopedics;  Laterality: Left;  Irrigation and Debridement Left Foot  . I&D EXTREMITY  09/18/2012   Procedure:  IRRIGATION AND DEBRIDEMENT EXTREMITY;  Surgeon: Newt Minion, MD;  Location: [facility];  Service: Orthopedics;  Laterality: Left;  . MENISCUS REPAIR Left 04/2016   Orthopedist, Dr [name] in [place], [place]   Social History   Occupational History  . Not on file.   Social History Main Topics  . Smoking status: Former Smoker    Types: Cigarettes    Quit date: 09/03/2012  . Smokeless tobacco: Former Systems developer    Quit date: 09/03/2012  . Alcohol use 4.8 oz/week    6 Cans of beer, 2 Shots of liquor per week     Comment: weekends.  . Drug use: No  . Sexual activity: Yes

## 2016-08-27 NOTE — Therapy (Signed)
[place], [place], [address] Phone: [phone]   Fax:  [phone]  Physical Therapy Treatment  Patient Details  Name: [name] MRN: [id number] Date of Birth: [birth year] Referring Provider: Rodell Perna Md  Encounter Date: 08/27/2016      PT End of Session - 08/27/16 1503    Visit Number 9   Number of Visits 13   Date for PT Re-Evaluation 09/10/16   Authorization Type workers comp   PT Start Time 0300   PT Stop Time 0400   PT Time Calculation (min) 60 min      Past Medical History:  Diagnosis Date  . Allergy   . Diabetes mellitus    Type 2 DM, Patient reports DM Type 1 diagnosd age at age 31  . Diabetic foot ulcers ([place])   . Foreign body in left foot    with  infection  . Hypertension   . Meniscus tear   . Multiple rib fractures   . Personal history of diabetic foot ulcer, Left Foot     Past Surgical History:  Procedure Laterality Date  . EYE SURGERY    . I&D EXTREMITY  04/23/2012   Procedure: IRRIGATION AND DEBRIDEMENT EXTREMITY;  Surgeon: Newt Minion, MD;  Location: [facility];  Service: Orthopedics;  Laterality: Left;  Irrigation and Debridement Left Foot  . I&D EXTREMITY  09/18/2012   Procedure: IRRIGATION AND DEBRIDEMENT EXTREMITY;  Surgeon: Newt Minion, MD;  Location: [facility];  Service: Orthopedics;  Laterality: Left;  . MENISCUS REPAIR Left 04/2016   Orthopedist, Dr [name] in [place], [place]    There were no vitals filed for this visit.      Subjective Assessment - 08/27/16 1502    Subjective I got a new insole from my doctor an hour ago.    Currently in Pain? Yes   Pain Score 4    Pain Location Knee   Pain Orientation Right   Pain Score 7   Pain Location Foot                         [facility] Adult PT Treatment/Exercise - 08/27/16 0001      Knee/Hip Exercises: Aerobic   Stationary Bike L3 x 8 min     Knee/Hip Exercises: Seated   Long Arc Quad  Strengthening;Both;2 sets;10 reps;Weights   Long Arc Quad Weight 4 lbs.   Marching Limitations 2 x 10   Marching Weights 4 lbs.     Knee/Hip Exercises: Supine   Short Arc Quad Sets 20 reps   Short Arc Quad Sets Limitations 4#   Straight Leg Raises Left;10 reps   Straight Leg Raises Limitations 2#   Straight Leg Raise with External Rotation Left;10 reps   Straight Leg Raise with External Rotation Limitations 2#     Cryotherapy   Number Minutes Cryotherapy 10 Minutes   Cryotherapy Location Knee   Type of Cryotherapy Ice pack                  PT Short Term Goals - 08/27/16 1542      PT SHORT TERM GOAL #1   Title pt will be I with inital HEP (07/23/2016)   Status Achieved     PT SHORT TERM GOAL #2   Title pt will increase L knee strength and bil hip strength to >/= 4-/5 with </= 4/10 pain during testing to promote functional strength for functional mobility (07/23/2016)  Status Unable to assess     PT SHORT TERM GOAL #3   Title pt will be able to walk / stand for >/= 20 min with </= 4/10 pain to promote endurance (07/23/2016)   Baseline 10 minutes   Time 3   Period Weeks   Status On-going           PT Long Term Goals - 07/02/16 1755      PT LONG TERM GOAL #1   Title pt will be I with all HEP given as of last visit (08/13/2016)   Baseline *   Time 6   Period Weeks   Status New     PT LONG TERM GOAL #2   Title pt will improve L knee and bil hip strength to >/= 4/5 strength with </= 2/10 pain for prolong walking / standing activities (08/13/2016)   Time 6   Period Weeks   Status New     PT LONG TERM GOAL #3   Title pt will increase L knee flexion to >/= 125 degrees and extension to </= -5 degrees with </= 2/10 pain for functional and efficent gait pattern (08/13/2016)   Baseline *   Time 6   Period Weeks   Status New     PT LONG TERM GOAL #4   Title he will be able to walk/ stand >/= 30 min and navigate up/down >/= 15 steps reciprocally with </= 1 HHA  to assist with functional mobility and endurance required for potential work related activite and tasks (08/13/2016)   Time 6   Period Weeks   Status New     PT LONG TERM GOAL #5   Title pt will increase his FOTO score to </= 42% limitation to demontrate improvement in function at discharge (08/13/2016)   Time 6   Period Weeks   Status New               Plan - 08/27/16 1540    Clinical Impression Statement Pt just came from [facility]. The ulcer on his foot was scraped so he is having increased foot pain to 7-8/10. He received a new insole from MD to assist with pressure to that area. We focused open chain strengthening due to increased foot pain.    PT Next Visit Plan ,hamstring stretching, progress strengthening of hips/ knee, modalites PRN,   PT Home Exercise Plan sit to stand, hamstring stretching, calf stretching (bent/ straight) sidelying hip abduction, quad set, adductor stretch, LAQ with theraband, standing hp extension/ abduction   Consulted and Agree with Plan of Care Patient      Patient will benefit from skilled therapeutic intervention in order to improve the following deficits and impairments:  Pain, Improper body mechanics, Postural dysfunction, Decreased range of motion, Decreased endurance, Decreased activity tolerance, Hypomobility, Decreased strength, Decreased mobility, Difficulty walking, Increased fascial restricitons  Visit Diagnosis: No diagnosis found.     Problem List Patient Active Problem List   Diagnosis Date Noted  . Cellulitis of left lower extremity   . Cellulitis 07/15/2016  . Hyperlipidemia 05/28/2016  . Hypertriglyceridemia 05/28/2016  . Diabetic foot ulcers ([place])   . Left leg pain 02/27/2015  . Encounter for chronic pain management 01/30/2015  . Rib pain on left side 01/27/2015  . Injury resulting from fall from height 12/09/2014  . Adjustment disorder with anxiety 12/09/2014  . Broken rib 10/28/2014  . Pain in right hip 12/10/2012  .  Erectile dysfunction 10/21/2012  . HTN (hypertension), benign 04/14/2012  .  DM (diabetes mellitus), type 2, uncontrolled ([place]) 04/13/2012    Dorene Ar, PTA 08/27/2016, 3:42 PM  [facility] [address] [place], [place], [address] Phone: [phone]   Fax:  [phone]  Name: [name] MRN: [id number] Date of Birth: Jul 08, 1966

## 2016-09-03 ENCOUNTER — Encounter (INDEPENDENT_AMBULATORY_CARE_PROVIDER_SITE_OTHER): Payer: Self-pay | Admitting: Orthopaedic Surgery

## 2016-09-03 ENCOUNTER — Ambulatory Visit (INDEPENDENT_AMBULATORY_CARE_PROVIDER_SITE_OTHER): Payer: Worker's Compensation | Admitting: Orthopaedic Surgery

## 2016-09-03 ENCOUNTER — Ambulatory Visit: Payer: Self-pay | Admitting: Physical Therapy

## 2016-09-03 VITALS — BP 156/96 | HR 88 | Ht 71.0 in | Wt 198.0 lb

## 2016-09-03 DIAGNOSIS — M23322 Other meniscus derangements, posterior horn of medial meniscus, left knee: Secondary | ICD-10-CM

## 2016-09-03 NOTE — Progress Notes (Signed)
Post-Op Visit Note   Patient: [name]           Date of Birth: 30-Aug-1966           MRN: [id number] Visit Date: 09/03/2016 PCP: [name], MD   Assessment & Plan:  Chief Complaint:  Chief Complaint  Patient presents with  . Left Knee - Follow-up   Visit Diagnoses:  1. Other meniscus derangements, posterior horn of medial meniscus, left knee     Plan: Patient is finished up a physical therapy. He still has some pain when he is on his feet for more than 15 minutes or so. Patient a posterior medial meniscal tear and debridement of the medial inflamed plica. He's had problems with the diabetes he is type II and and has had elevated A1c at 10. Problems with the diabetic foot ulcers in the past. We discussed the importance of better diabetic control to help decrease likelihood of further with pediatric problems. 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 He can follow-up with me on a when necessary basis. Patient's impairment rating is that 10% of the left leg for the meniscal tear. Patient is at [facility]. Return when necessary  Follow-Up Instructions: Return if symptoms worsen or fail to improve.   Orders:  No orders of the defined types were placed in this encounter.  No orders of the defined types were placed in this encounter.  Patient returns for six week recheck left knee. He is status post left knee scope, partial medial meniscectomy, medial plica excision on 06/09/9029. He is still attending physical therapy, which he states is helping. He takes over the counter meds as needed for pain.   PMFS History: Patient Active Problem List   Diagnosis Date Noted  . Cellulitis of left lower extremity   . Cellulitis 07/15/2016  . Hyperlipidemia 05/28/2016  . Hypertriglyceridemia 05/28/2016  . Diabetic foot  ulcers ([place])   . Left leg pain 02/27/2015  . Encounter for chronic pain management 01/30/2015  . Rib pain on left side 01/27/2015  . Injury resulting from fall from height 12/09/2014  . Adjustment disorder with anxiety 12/09/2014  . Broken rib 10/28/2014  . Pain in right hip 12/10/2012  . Erectile dysfunction 10/21/2012  . HTN (hypertension), benign 04/14/2012  . DM (diabetes mellitus), type 2, uncontrolled ([place]) 04/13/2012   Past Medical History:  Diagnosis Date  . Allergy   . Diabetes mellitus    Type 2 DM, Patient reports DM Type 1 diagnosd age at age 75  . Diabetic foot ulcers ([place])   . Foreign body in left foot    with  infection  . Hypertension   . Meniscus tear   . Multiple rib fractures   . Personal history of diabetic foot ulcer, Left Foot     Family History  Problem Relation Age of Onset  . Diabetes type II Mother   . Arthritis Mother   . Diabetes Mother   . Hyperlipidemia Mother   . Hypertension Mother   . Diabetes type II Sister   . Diabetes Father     Past Surgical History:  Procedure Laterality Date  . EYE SURGERY    . I&D EXTREMITY  04/23/2012   Procedure: IRRIGATION AND DEBRIDEMENT EXTREMITY;  Surgeon: Newt Minion, MD;  Location:  [facility];  Service: Orthopedics;  Laterality: Left;  Irrigation and Debridement Left Foot  . I&D EXTREMITY  09/18/2012   Procedure: IRRIGATION AND DEBRIDEMENT EXTREMITY;  Surgeon: Newt Minion, MD;  Location: [facility];  Service: Orthopedics;  Laterality: Left;  . MENISCUS REPAIR Left 04/2016   Orthopedist, Dr [name] in [place], [place]   Social History   Occupational History  . Not on file.   Social History Main Topics  . Smoking status: Former Smoker    Types: Cigarettes    Quit date: 09/03/2012  . Smokeless tobacco: Former Systems developer    Quit date: 09/03/2012  . Alcohol use 4.8 oz/week    6 Cans of beer, 2 Shots of liquor per week     Comment: weekends.  . Drug use: No  . Sexual activity: Yes

## 2016-09-05 ENCOUNTER — Ambulatory Visit: Payer: Self-pay | Admitting: Physical Therapy

## 2016-09-05 DIAGNOSIS — M6281 Muscle weakness (generalized): Secondary | ICD-10-CM

## 2016-09-05 DIAGNOSIS — M25662 Stiffness of left knee, not elsewhere classified: Secondary | ICD-10-CM

## 2016-09-05 DIAGNOSIS — M25562 Pain in left knee: Secondary | ICD-10-CM

## 2016-09-05 DIAGNOSIS — G8929 Other chronic pain: Secondary | ICD-10-CM

## 2016-09-05 NOTE — Therapy (Deleted)
[place], [place], [address] Phone: [phone]   Fax:  [phone]  September 05, 2016   @CCLISTADDRESS @  Physical Therapy Discharge Summary  Patient: [name]  MRN: [id number]  Date of Birth: 1966/02/24   Diagnosis:  Chronic pain of left knee  Muscle weakness (generalized)  Stiffness of left knee, not elsewhere classified  Left knee pain, unspecified chronicity Referring Provider: Rodell Perna Md  The above patient had been seen in Physical Therapy *** times of *** treatments scheduled with *** no shows and *** cancellations.  The treatment consisted of *** The patient is: {improved/worse/unchanged:3041574}  Subjective: ***  Discharge Findings: ***  Functional Status at Discharge: ***  {INOMV:6720947}    Sincerely,   Hyacinth Meeker Dereck Leep, PTA   CC @CCLISTRESTNAME @  [place], [place], [address] Phone: [phone]   Fax:  [phone]  Patient: Khary Schaben  MRN: [id number]  Date of Birth: Sep 30, 1966

## 2016-09-05 NOTE — Therapy (Addendum)
[place], [place], [address] Phone: [phone]   Fax:  [phone]  Physical Therapy Treatment/ Discharge Note  Patient Details  Name: Teague Goynes MRN: [id number] Date of Birth: 1966-06-03 Referring Provider: Rodell Perna Md  Encounter Date: 09/05/2016      PT End of Session - 09/05/16 1510    Visit Number 10   Number of Visits 13   Date for PT Re-Evaluation 09/10/16   Authorization Type workers comp   PT Start Time 0300   PT Stop Time 0352   PT Time Calculation (min) 52 min      Past Medical History:  Diagnosis Date  . Allergy   . Diabetes mellitus    Type 2 DM, Patient reports DM Type 1 diagnosd age at age 50  . Diabetic foot ulcers ([place])   . Foreign body in left foot    with  infection  . Hypertension   . Meniscus tear   . Multiple rib fractures   . Personal history of diabetic foot ulcer, Left Foot     Past Surgical History:  Procedure Laterality Date  . EYE SURGERY    . I&D EXTREMITY  04/23/2012   Procedure: IRRIGATION AND DEBRIDEMENT EXTREMITY;  Surgeon: Newt Minion, MD;  Location: [facility];  Service: Orthopedics;  Laterality: Left;  Irrigation and Debridement Left Foot  . I&D EXTREMITY  09/18/2012   Procedure: IRRIGATION AND DEBRIDEMENT EXTREMITY;  Surgeon: Newt Minion, MD;  Location: [facility];  Service: Orthopedics;  Laterality: Left;  . MENISCUS REPAIR Left 04/2016   Orthopedist, Dr [name] in [place], [place]    There were no vitals filed for this visit.      Subjective Assessment - 09/05/16 1522    Subjective The insole helps alot, keeps the ulcer from touching    Currently in Pain? No/denies            [facility] PT Assessment - 09/05/16 0001      Observation/Other Assessments   Focus on Therapeutic Outcomes (FOTO)  59% limited      AROM   Left Knee Extension 3   Left Knee Flexion 130     Strength   Right Hip Flexion 4/5   Left Hip Flexion 4/5   Left Knee Flexion  5/5   Left Knee Extension 4/5                     [facility] Adult PT Treatment/Exercise - 09/05/16 0001      Ambulation/Gait   Stairs Yes   Stairs Assistance 7: Independent   Stair Management Technique Alternating pattern;One rail Right   Number of Stairs 16   Height of Stairs 6     Knee/Hip Exercises: Stretches   Gastroc Stretch 3 reps;30 seconds   Soleus Stretch 3 reps;30 seconds     Knee/Hip Exercises: Aerobic   Stationary Bike L3 x 8 min     Knee/Hip Exercises: Standing   Heel Raises 1 set;10 reps   Heel Raises Limitations single heel raises only able to left 3 reps on left , no c/o pain   Hip Abduction Stengthening;Both;2 sets;10 reps;Knee straight  verbal cues to keep knee straight   Hip Extension Stengthening;Both;2 sets;10 reps;Knee straight  verbal cues for form   Forward Step Up 10 reps   Forward Step Up Limitations feels tightness in calf   Step Down 10 reps   Step Down Limitations tightness in calf  Knee/Hip Exercises: Seated   Long Arc Quad Limitations red band review for HEP     Cryotherapy   Number Minutes Cryotherapy 10 Minutes   Cryotherapy Location Knee   Type of Cryotherapy Ice pack                PT Education - 09/05/16 1541    Education provided Yes   Education Details Gastroc soleus stretch, heel raises    Person(s) Educated Patient   Methods Explanation;Handout   Comprehension Verbalized understanding          PT Short Term Goals - 09/05/16 1508      PT SHORT TERM GOAL #1   Title pt will be I with inital HEP (07/23/2016)   Time 3   Period Weeks   Status Achieved     PT SHORT TERM GOAL #2   Title pt will increase L knee strength and bil hip strength to >/= 4-/5 with </= 4/10 pain during testing to promote functional strength for functional mobility (07/23/2016)   Time 3   Period Weeks   Status Achieved     PT SHORT TERM GOAL #3   Title pt will be able to walk / stand for >/= 20 min with </= 4/10 pain to  promote endurance (07/23/2016)   Baseline 15 minutes increased pain to 6/10    Time 3   Period Weeks   Status Not Met           PT Long Term Goals - 09/05/16 1521      PT LONG TERM GOAL #1   Title pt will be I with all HEP given as of last visit (08/13/2016)   Time 6   Period Weeks   Status Achieved     PT LONG TERM GOAL #2   Title pt will improve L knee and bil hip strength to >/= 4/5 strength with </= 2/10 pain for prolong walking / standing activities (08/13/2016)   Time 6   Period Weeks   Status Achieved     PT LONG TERM GOAL #3   Title pt will increase L knee flexion to >/= 125 degrees and extension to </= -5 degrees with </= 2/10 pain for functional and efficent gait pattern (08/13/2016)   Baseline 3-130   Time 6   Period Weeks   Status Achieved     PT LONG TERM GOAL #4   Title he will be able to walk/ stand >/= 30 min and navigate up/down >/= 15 steps reciprocally with </= 1 HHA to assist with functional mobility and endurance required for potential work related activite and tasks (08/13/2016)   Baseline 15 minutes max walk/stand    Time 6   Period Weeks   Status Partially Met     PT LONG TERM GOAL #5   Title pt will increase his FOTO score to </= 42% limitation to demontrate improvement in function at discharge (08/13/2016)   Time 6   Period Weeks   Status Achieved               Plan - 09/05/16 1552    Clinical Impression Statement MD has released Mr Gallien. He will return to work with limitations on walking no more than 15 -20 minutes. His hip and knee strength have improved to at least 4/5 grossly. He has some left calf weakness which he will work on now that his foot pain has resolved from the ulcer. Then new insole issued from MD has helped alot.  Most goals met except walking tolerance. Will discharge to HEP. Pt plans to join the gym to continue strengthening.    PT Next Visit Plan discharge today   PT Home Exercise Plan sit to stand, hamstring  stretching, calf stretching (bent/ straight) sidelying hip abduction, quad set, adductor stretch, LAQ with theraband, standing hp extension/ abduction, heel raises bilateral and single,    Consulted and Agree with Plan of Care Patient      Patient will benefit from skilled therapeutic intervention in order to improve the following deficits and impairments:  Pain, Improper body mechanics, Postural dysfunction, Decreased range of motion, Decreased endurance, Decreased activity tolerance, Hypomobility, Decreased strength, Decreased mobility, Difficulty walking, Increased fascial restricitons  Visit Diagnosis: Chronic pain of left knee  Muscle weakness (generalized)  Stiffness of left knee, not elsewhere classified  Left knee pain, unspecified chronicity     Problem List Patient Active Problem List   Diagnosis Date Noted  . Cellulitis of left lower extremity   . Cellulitis 07/15/2016  . Hyperlipidemia 05/28/2016  . Hypertriglyceridemia 05/28/2016  . Diabetic foot ulcers ([place])   . Left leg pain 02/27/2015  . Encounter for chronic pain management 01/30/2015  . Rib pain on left side 01/27/2015  . Injury resulting from fall from height 12/09/2014  . Adjustment disorder with anxiety 12/09/2014  . Broken rib 10/28/2014  . Pain in right hip 12/10/2012  . Erectile dysfunction 10/21/2012  . HTN (hypertension), benign 04/14/2012  . DM (diabetes mellitus), type 2, uncontrolled ([place]) 04/13/2012    Dorene Ar, PTA 09/05/2016, 4:11 PM  [facility] [address] [place], [place], [address] Phone: [phone]   Fax:  [phone]  Name: Kieffer Blatz MRN: [id number] Date of Birth: 1966-08-02      PHYSICAL THERAPY DISCHARGE SUMMARY  Visits from Start of Care: 10  Current functional level related to goals / functional outcomes: See goals   Remaining deficits: Intermittent knee stiffness/ soreness with hamstring  tightness and calf tightness.    Education / Equipment: HEP, posture biomechanics, theraband.   Plan: Patient agrees to discharge.  Patient goals were partially met. Patient is being discharged due to being pleased with the current functional level.  ?????     Kristoffer Leamon PT, DPT, LAT, ATC  11/20/16  12:06 PM

## 2016-10-30 ENCOUNTER — Ambulatory Visit (INDEPENDENT_AMBULATORY_CARE_PROVIDER_SITE_OTHER): Payer: Self-pay | Admitting: Orthopedic Surgery

## 2016-11-12 ENCOUNTER — Telehealth (INDEPENDENT_AMBULATORY_CARE_PROVIDER_SITE_OTHER): Payer: Self-pay | Admitting: Orthopaedic Surgery

## 2016-11-12 NOTE — Telephone Encounter (Signed)
AMY @ [facility] CALLED. SHE IS MISSING SEVERAL DOS AND IS ASKING THEY BE FAXED OVER. DOS 05/2016-PRESENT FAXED TO HER DIRECTLY [phone].  WE HAVE RELEASE ON FILE.

## 2017-01-15 ENCOUNTER — Encounter (INDEPENDENT_AMBULATORY_CARE_PROVIDER_SITE_OTHER): Payer: Self-pay | Admitting: Ophthalmology

## 2017-01-15 ENCOUNTER — Telehealth: Payer: Self-pay | Admitting: Student

## 2017-01-15 NOTE — Telephone Encounter (Signed)
Sched 15 min DM f/u 5/21. - Mesha Guinyard

## 2017-02-24 ENCOUNTER — Ambulatory Visit: Payer: Worker's Compensation | Admitting: Student

## 2017-03-21 ENCOUNTER — Telehealth: Payer: Self-pay | Admitting: Student

## 2017-03-21 NOTE — Telephone Encounter (Signed)
Pt doesn't have insurance and I let him know about the orange card and financial assistance we have at the office. Will call back when he's insured. - Mesha Guinyard

## 2018-04-01 ENCOUNTER — Encounter: Payer: Self-pay | Admitting: Student

## 2018-04-14 ENCOUNTER — Emergency Department (HOSPITAL_COMMUNITY)
Admission: EM | Admit: 2018-04-14 | Discharge: 2018-04-14 | Disposition: A | Discharge status: Home / Self Care | Payer: Self-pay | Attending: Emergency Medicine | Admitting: Emergency Medicine

## 2018-04-14 ENCOUNTER — Other Ambulatory Visit: Payer: Self-pay

## 2018-04-14 ENCOUNTER — Encounter (HOSPITAL_COMMUNITY): Payer: Self-pay | Admitting: Emergency Medicine

## 2018-04-14 DIAGNOSIS — I10 Essential (primary) hypertension: Secondary | ICD-10-CM | POA: Insufficient documentation

## 2018-04-14 DIAGNOSIS — R739 Hyperglycemia, unspecified: Secondary | ICD-10-CM

## 2018-04-14 DIAGNOSIS — E1165 Type 2 diabetes mellitus with hyperglycemia: Secondary | ICD-10-CM | POA: Insufficient documentation

## 2018-04-14 DIAGNOSIS — E785 Hyperlipidemia, unspecified: Secondary | ICD-10-CM

## 2018-04-14 DIAGNOSIS — Z794 Long term (current) use of insulin: Secondary | ICD-10-CM | POA: Insufficient documentation

## 2018-04-14 DIAGNOSIS — Z87891 Personal history of nicotine dependence: Secondary | ICD-10-CM | POA: Insufficient documentation

## 2018-04-14 LAB — URINALYSIS, ROUTINE W REFLEX MICROSCOPIC
Bilirubin Urine: NEGATIVE
Glucose, UA: >=500 mg/dL — AB
Ketones, ur: NEGATIVE mg/dL
Leukocytes, UA: NEGATIVE
Nitrite: NEGATIVE
SPECIFIC GRAVITY, URINE: 1.012 (ref 1.005–1.030)
pH: 6 (ref 5.0–8.0)

## 2018-04-14 LAB — BASIC METABOLIC PANEL
ANION GAP: 9 (ref 5–15)
BUN: 17 mg/dL (ref 6–20)
CALCIUM: 9 mg/dL (ref 8.9–10.3)
CO2: 29 mmol/L (ref 22–32)
CREATININE: 1.49 mg/dL — AB (ref 0.61–1.24)
Chloride: 100 mmol/L (ref 98–111)
GFR calc Af Amer: >60 mL/min (ref >60)
GFR, EST NON AFRICAN AMERICAN: 53 mL/min — AB (ref >60)
GLUCOSE: 383 mg/dL — AB (ref 70–99)
Potassium: 4.5 mmol/L (ref 3.5–5.1)
Sodium: 138 mmol/L (ref 135–145)

## 2018-04-14 LAB — CBG MONITORING, ED
GLUCOSE-CAPILLARY: 348 mg/dL — AB (ref 70–99)
GLUCOSE-CAPILLARY: 221 mg/dL — AB (ref 70–99)
GLUCOSE-CAPILLARY: 238 mg/dL — AB (ref 70–99)

## 2018-04-14 LAB — CBC
HCT: 46.8 % (ref 39.0–52.0)
HEMOGLOBIN: 16.4 g/dL (ref 13.0–17.0)
MCH: 32 pg (ref 26.0–34.0)
MCHC: 35 g/dL (ref 30.0–36.0)
MCV: 91.4 fL (ref 78.0–100.0)
Platelets: 194 10*3/uL (ref 150–400)
RBC: 5.12 MIL/uL (ref 4.22–5.81)
RDW: 12 % (ref 11.5–15.5)
WBC: 6.9 10*3/uL (ref 4.0–10.5)

## 2018-04-14 MED ORDER — AMLODIPINE BESYLATE 5 MG PO TABS
5.0000 mg | ORAL_TABLET | Freq: Once | ORAL | Status: AC
  Administered 2018-04-14: 5 mg via ORAL
  Filled 2018-04-14: qty 1

## 2018-04-14 MED ORDER — CLONIDINE HCL 0.1 MG PO TABS
0.1000 mg | ORAL_TABLET | Freq: Once | ORAL | Status: AC
  Administered 2018-04-14: 0.1 mg via ORAL
  Filled 2018-04-14: qty 1

## 2018-04-14 MED ORDER — SODIUM CHLORIDE 0.9 % IV BOLUS
1000.0000 mL | Freq: Once | INTRAVENOUS | Status: AC
  Administered 2018-04-14: 1000 mL via INTRAVENOUS

## 2018-04-14 MED ORDER — INSULIN ASPART 100 UNIT/ML ~~LOC~~ SOLN
5.0000 [IU] | Freq: Once | SUBCUTANEOUS | Status: AC
  Administered 2018-04-14: 5 [IU] via INTRAVENOUS
  Filled 2018-04-14: qty 1

## 2018-04-14 MED ORDER — INSULIN NPH (HUMAN) (ISOPHANE) 100 UNIT/ML ~~LOC~~ SUSP: 4.0000 [IU] | Freq: Two times a day (BID) | SUBCUTANEOUS | 1 refills | Status: DC

## 2018-04-14 MED ORDER — INSULIN ASPART 100 UNIT/ML ~~LOC~~ SOLN
5.0000 [IU] | Freq: Once | SUBCUTANEOUS | Status: AC
Start: 2018-04-14 — End: 2018-04-14
  Administered 2018-04-14: 5 [IU] via SUBCUTANEOUS
  Filled 2018-04-14: qty 1

## 2018-04-14 MED ORDER — AMLODIPINE BESYLATE 5 MG PO TABS: 5.0000 mg | ORAL_TABLET | Freq: Every day | ORAL | 1 refills | Status: DC

## 2018-04-14 MED ORDER — ATORVASTATIN CALCIUM 40 MG PO TABS: 40.0000 mg | ORAL_TABLET | Freq: Every day | ORAL | 1 refills | Status: DC

## 2018-04-14 NOTE — ED Notes (Signed)
Pt reports not taking BP meds for over 1 year. Has felt dizzy for a week and shaky. States he has been out of his insulin for about 4 days and has been using it sparingly before that due to loss of insurance.

## 2018-04-14 NOTE — ED Triage Notes (Signed)
States has been out of insulin x 4 days checked his sugar  This am and it was over 500, pt states feels dizzy and achy

## 2018-04-14 NOTE — ED Notes (Signed)
Pt's CBG result was 348. Informed Cindy - RN.

## 2018-04-14 NOTE — ED Provider Notes (Signed)
[facility] EMERGENCY DEPARTMENT Provider Note   CSN: [id number] Arrival date & time: 04/14/18  1005     History   Chief Complaint Chief Complaint  Patient presents with  . Hyperglycemia    HPI [name] is a 52 y.o. male.  HPI 52 year old male with past medical history of diabetes, hypertension, chronic medication nonadherence, here with multiple complaints.  The patient states that he ran out of his insulin several months ago.  Since then, his blood pressure and blood sugar have been increasing.  Also been out of his blood pressure medicine.  He endorses associated generalized weakness, polyuria, and polydipsia.  He has had dry mouth.  He has been taking his metformin, which he still has, but denies any other recent medication changes.  He states that he recently lost his insurance so he does not have a primary doctor.  Is been trying to buy his insulin at [facility].  Denies any fevers or chills.  No other medication changes.  No dietary changes.  He tries to eat a low sugar diet.  Past Medical History:  Diagnosis Date  . Allergy   . Diabetes mellitus    Type 2 DM, Patient reports DM Type 1 diagnosd age at age 47  . Diabetic foot ulcers ([place])   . Foreign body in left foot    with  infection  . Hypertension   . Meniscus tear   . Multiple rib fractures   . Personal history of diabetic foot ulcer, Left Foot     Patient Active Problem List   Diagnosis Date Noted  . Cellulitis of left lower extremity   . Cellulitis 07/15/2016  . Hyperlipidemia 05/28/2016  . Hypertriglyceridemia 05/28/2016  . Diabetic foot ulcers ([place])   . Left leg pain 02/27/2015  . Rib pain on left side 01/27/2015  . Injury resulting from fall from height 12/09/2014  . Adjustment disorder with anxiety 12/09/2014  . Broken rib 10/28/2014  . Pain in right hip 12/10/2012  . Erectile dysfunction 10/21/2012  . HTN (hypertension), benign 04/14/2012  . DM (diabetes mellitus), type 2,  uncontrolled ([place]) 04/13/2012    Past Surgical History:  Procedure Laterality Date  . EYE SURGERY    . I&D EXTREMITY  04/23/2012   Procedure: IRRIGATION AND DEBRIDEMENT EXTREMITY;  Surgeon: Newt Minion, MD;  Location: [place];  Service: Orthopedics;  Laterality: Left;  Irrigation and Debridement Left Foot  . I&D EXTREMITY  09/18/2012   Procedure: IRRIGATION AND DEBRIDEMENT EXTREMITY;  Surgeon: Newt Minion, MD;  Location: [place];  Service: Orthopedics;  Laterality: Left;  . MENISCUS REPAIR Left 04/2016   Orthopedist, Dr [name] in [place], [place]        Home Medications    Prior to Admission medications   Medication Sig Start Date End Date Taking? Authorizing Provider  amLODipine (NORVASC) 5 MG tablet Take 1 tablet (5 mg total) by mouth daily. 04/14/18 05/14/18  Duffy Bruce, MD  atorvastatin (LIPITOR) 40 MG tablet Take 1 tablet (40 mg total) by mouth daily. 04/14/18   Duffy Bruce, MD  ibuprofen (ADVIL,MOTRIN) 200 MG tablet Take 400 mg by mouth every 6 (six) hours as needed for fever or moderate pain.    [provider]  insulin NPH Human (HUMULIN N,NOVOLIN N) 100 UNIT/ML injection Inject 0.04-0.12 mLs (4-12 Units total) into the skin 2 (two) times daily before a meal. 04/14/18   Duffy Bruce, MD  metFORMIN (GLUCOPHAGE) 500 MG tablet Take 1 tablet (500 mg total)  by mouth 2 (two) times daily with a meal. 01/30/15   Kuneff, Renee A, DO  urea 10 % lotion Apply topically 2 (two) times daily. Patient not taking: Reported on 09/03/2016 07/18/16   Marjie Skiff, MD    Family History Family History  Problem Relation Age of Onset  . Diabetes type II Mother   . Arthritis Mother   . Diabetes Mother   . Hyperlipidemia Mother   . Hypertension Mother   . Diabetes type II Sister   . Diabetes Father     Social History Social History   Tobacco Use  . Smoking status: Former Smoker    Types: Cigarettes    Last attempt to quit: 09/03/2012    Years since quitting: 5.6  .  Smokeless tobacco: Former Systems developer    Quit date: 09/03/2012  Substance Use Topics  . Alcohol use: Yes    Alcohol/week: 4.8 oz    Types: 6 Cans of beer, 2 Shots of liquor per week    Comment: weekends.  . Drug use: No     Allergies   Latex   Review of Systems Review of Systems  Constitutional: Positive for fatigue. Negative for chills and fever.  HENT: Negative for congestion and rhinorrhea.   Eyes: Negative for visual disturbance.  Respiratory: Negative for cough, shortness of breath and wheezing.   Cardiovascular: Negative for chest pain and leg swelling.  Gastrointestinal: Negative for abdominal pain, diarrhea, nausea and vomiting.  Endocrine: Positive for polydipsia and polyuria.  Genitourinary: Negative for dysuria and flank pain.  Musculoskeletal: Negative for neck pain and neck stiffness.  Skin: Negative for rash and wound.  Allergic/Immunologic: Negative for immunocompromised state.  Neurological: Positive for weakness. Negative for syncope and headaches.  All other systems reviewed and are negative.    Physical Exam Updated Vital Signs BP (!) 216/116   Pulse 81   Temp 99.1 F (37.3 C) (Oral)   Resp 17   Ht 5\' 10"  (1.778 m)   Wt 90.7 kg (200 lb)   SpO2 97%   BMI 28.70 kg/m   Physical Exam  Constitutional: He is oriented to person, place, and time. He appears well-developed and well-nourished. No distress.  HENT:  Head: Normocephalic and atraumatic.  Mildly dry mucous membranes  Eyes: Conjunctivae are normal.  Neck: Neck supple.  Cardiovascular: Normal rate, regular rhythm and normal heart sounds. Exam reveals no friction rub.  No murmur heard. Pulmonary/Chest: Effort normal and breath sounds normal. No respiratory distress. He has no wheezes. He has no rales.  Abdominal: He exhibits no distension.  Musculoskeletal: He exhibits no edema.  Neurological: He is alert and oriented to person, place, and time. He exhibits normal muscle tone.  Skin: Skin is warm.  Capillary refill takes less than 2 seconds.  Psychiatric: He has a normal mood and affect.  Nursing note and vitals reviewed.    ED Treatments / Results  Labs (all labs ordered are listed, but only abnormal results are displayed) Labs Reviewed  BASIC METABOLIC PANEL - Abnormal; Notable for the following components:      Result Value   Glucose, Bld 383 (*)    Creatinine, Ser 1.49 (*)    GFR calc non Af Amer 53 (*)    All other components within normal limits  CBG MONITORING, ED - Abnormal; Notable for the following components:   Glucose-Capillary 348 (*)    All other components within normal limits  CBG MONITORING, ED - Abnormal; Notable for the following components:  Glucose-Capillary 221 (*)    All other components within normal limits  CBC  URINALYSIS, ROUTINE W REFLEX MICROSCOPIC  CBG MONITORING, ED    EKG None  Radiology No results found.  Procedures Procedures (including critical care time)  Medications Ordered in ED Medications  sodium chloride 0.9 % bolus 1,000 mL (0 mLs Intravenous Stopped 04/14/18 1343)  sodium chloride 0.9 % bolus 1,000 mL (0 mLs Intravenous Stopped 04/14/18 1343)  insulin aspart (novoLOG) injection 5 Units (5 Units Intravenous Given 04/14/18 1236)  amLODipine (NORVASC) tablet 5 mg (5 mg Oral Given 04/14/18 1343)  cloNIDine (CATAPRES) tablet 0.1 mg (0.1 mg Oral Given 04/14/18 1407)  insulin aspart (novoLOG) injection 5 Units (5 Units Subcutaneous Given 04/14/18 1413)     Initial Impression / Assessment and Plan / ED Course  I have reviewed the triage vital signs and the nursing notes.  Pertinent labs & imaging results that were available during my care of the patient were reviewed by me and considered in my medical decision making (see chart for details).    52 year old male with past medical history of poorly controlled type 2 diabetes and hypertension here with hyperglycemia in the setting of being out of his insulin.  Lab work today shows  possible mild AKI, but no evidence of DKA.  He has a normal anion gap and bicarb.  Suspect hypoglycemia and dehydration secondary to nonadherence.  Patient was given fluids and insulin with improvement in his sugar.  Will start him on low-dose Norvasc, refill his insulin, and refer him to the wellness center for PCP follow-up.  Reiterated the importance of close follow-up and good return precautions.  Final Clinical Impressions(s) / ED Diagnoses   Final diagnoses:  Hyperglycemia  Essential hypertension    ED Discharge Orders        Ordered    atorvastatin (LIPITOR) 40 MG tablet  Daily     04/14/18 1507    insulin NPH Human (HUMULIN N,NOVOLIN N) 100 UNIT/ML injection  2 times daily before meals     04/14/18 1507    amLODipine (NORVASC) 5 MG tablet  Daily     04/14/18 1507       Duffy Bruce, MD 04/14/18 [phone]

## 2018-06-30 ENCOUNTER — Encounter (HOSPITAL_COMMUNITY): Payer: Self-pay | Admitting: Emergency Medicine

## 2018-06-30 ENCOUNTER — Emergency Department (HOSPITAL_COMMUNITY)
Admission: EM | Admit: 2018-06-30 | Discharge: 2018-07-01 | Disposition: A | Discharge status: Home / Self Care | Payer: Self-pay | Attending: Emergency Medicine | Admitting: Emergency Medicine

## 2018-06-30 DIAGNOSIS — Z5321 Procedure and treatment not carried out due to patient leaving prior to being seen by health care provider: Secondary | ICD-10-CM | POA: Insufficient documentation

## 2018-06-30 DIAGNOSIS — M79671 Pain in right foot: Secondary | ICD-10-CM | POA: Insufficient documentation

## 2018-06-30 LAB — CBG MONITORING, ED: Glucose-Capillary: 120 mg/dL — ABNORMAL HIGH (ref 70–99)

## 2018-06-30 LAB — BASIC METABOLIC PANEL
Anion gap: 9 (ref 5–15)
BUN: 18 mg/dL (ref 6–20)
CO2: 25 mmol/L (ref 22–32)
CREATININE: 1.45 mg/dL — AB (ref 0.61–1.24)
Calcium: 8.7 mg/dL — ABNORMAL LOW (ref 8.9–10.3)
Chloride: 106 mmol/L (ref 98–111)
GFR calc Af Amer: >60 mL/min (ref >60)
GFR, EST NON AFRICAN AMERICAN: 55 mL/min — AB (ref >60)
Glucose, Bld: 159 mg/dL — ABNORMAL HIGH (ref 70–99)
Potassium: 4.3 mmol/L (ref 3.5–5.1)
SODIUM: 140 mmol/L (ref 135–145)

## 2018-06-30 LAB — CBC
HCT: 41.9 % (ref 39.0–52.0)
Hemoglobin: 14.4 g/dL (ref 13.0–17.0)
MCH: 31.6 pg (ref 26.0–34.0)
MCHC: 34.4 g/dL (ref 30.0–36.0)
MCV: 92.1 fL (ref 78.0–100.0)
PLATELETS: 173 10*3/uL (ref 150–400)
RBC: 4.55 MIL/uL (ref 4.22–5.81)
RDW: 12 % (ref 11.5–15.5)
WBC: 7.5 10*3/uL (ref 4.0–10.5)

## 2018-06-30 NOTE — ED Triage Notes (Signed)
Pt has a closed wound on the right bottom of his foot that patient states has been there for one month. Sore appears like a callous- pt states it is very tender and painful. No redness or drainage noted. Pt reports poor control of blood sugar.

## 2018-07-01 ENCOUNTER — Ambulatory Visit (INDEPENDENT_AMBULATORY_CARE_PROVIDER_SITE_OTHER): Payer: Self-pay | Admitting: Family Medicine

## 2018-07-01 ENCOUNTER — Encounter: Payer: Self-pay | Admitting: Family Medicine

## 2018-07-01 ENCOUNTER — Other Ambulatory Visit: Payer: Self-pay

## 2018-07-01 VITALS — BP 170/100 | HR 97 | Temp 98.3°F | Wt 190.0 lb

## 2018-07-01 DIAGNOSIS — I10 Essential (primary) hypertension: Secondary | ICD-10-CM

## 2018-07-01 DIAGNOSIS — E1165 Type 2 diabetes mellitus with hyperglycemia: Secondary | ICD-10-CM

## 2018-07-01 DIAGNOSIS — L84 Corns and callosities: Secondary | ICD-10-CM

## 2018-07-01 HISTORY — DX: Corns and callosities: L84

## 2018-07-01 LAB — POCT GLYCOSYLATED HEMOGLOBIN (HGB A1C): HbA1c, POC (controlled diabetic range): 8.8 % — AB (ref 0.0–7.0)

## 2018-07-01 MED ORDER — AMLODIPINE BESYLATE 10 MG PO TABS: 10.0000 mg | ORAL_TABLET | Freq: Every day | ORAL | 1 refills | Status: DC

## 2018-07-01 MED ORDER — METFORMIN HCL 1000 MG PO TABS: 1000.0000 mg | ORAL_TABLET | Freq: Two times a day (BID) | ORAL | 3 refills | Status: DC

## 2018-07-01 NOTE — Patient Instructions (Signed)
   Nice to meet you today!  We'll see you in 1 week for your BP check.  Please use a pumice stone or emory board to remove dead skin from the callous on your foot. Soak foot well and try to remove skin when soft as well.  Please get foot cushions to offload pressure from the base of the big toe.  We'll see you back in 1 month for your foot and 3 months for your diabetes check.  If you have questions or concerns please do not hesitate to call at [phone].  Lucila Maine, DO PGY-3, [facility] 07/01/2018 2:38 PM

## 2018-07-01 NOTE — Assessment & Plan Note (Signed)
  Poorly controlled, A1C today 8.8. Rx for metformin given and dose increased to 1000 mg BID. Follow up 3 months for next A1C.

## 2018-07-01 NOTE — Progress Notes (Signed)
    Subjective:    Patient ID: [name], male    DOB: Sep 01, 1966, 52 y.o.   MRN: [id number]   CC: foot wound  Has not been seen in our clinic since 05/2016 Hx of foot ulcers, last admitted 07/2016 on IV antibiotics. MRI was negative for osteo. Followed up with ortho for callous removal several times and things resolved.  Callous started building up about 3 weeks ago. Began to become very painful. Currently patient reports pain is 8-9/10. He is wearing sandal on that shoe. Walking makes pain worse. No drainage, no redness, no fevers or chills.  DM- he buys novolog OTC and uses this "sparingly" as a sliding scale at home, 6-10 units w/ meals depending on CBGs. He has old rx for metformin 500 mg that he uses. Endorses neuropathy of feet. No polyuria or polydipsia.   HTN- reports he was on norvasc a few months ago but ran out of this prescription. Denies CP, headaches, vision changes, SOB, DOE.   Smoking status reviewed-former smoker  Review of Systems- see HPI   Objective:  BP (!) 170/100   Pulse 97   Temp 98.3 F (36.8 C) (Oral)   Wt 190 lb (86.2 kg)   SpO2 98%   BMI 27.26 kg/m  Vitals and nursing note reviewed  General: well nourished, in no acute distress HEENT: normocephalic, MMM Cardiac: Regular rate Respiratory: normal work of breathing Extremities: no edema or cyanosis. +2 DP pulses bilaterally. Left foot with large callous under first MTP joint. No surrounding erythema. Tender to palpation. No discharge present. Skin: warm and dry Neuro: alert and oriented, no focal deficits  Assessment & Plan:    Corn of foot  Skin gently debrided with 15 blade as much as patient could tolerate secondary to pain. No signs of infection. Discussed home care at length with patient. Discussed need to offload pressure from that area of foot to allow healing, avoid skin breakdown. Follow up 1 month to reassess. Patient verbalized understanding and agreement with plan.   DM (diabetes  mellitus), type 2, uncontrolled ([place])  Poorly controlled, A1C today 8.8. Rx for metformin given and dose increased to 1000 mg BID. Follow up 3 months for next A1C.  HTN (hypertension), benign  Patient with history of HTN. Has not been seen or followed for this due to lack of insurance. He was prescribed norvasc in ED several months ago, was taking 10 mg with good control then ran out of prescription. Will refill 10 mg. Follow up 1 week for BP check. Nurse visit made.     Return in about 1 week (around 07/08/2018).   Lucila Maine, DO Family Medicine Resident PGY-3

## 2018-07-01 NOTE — Assessment & Plan Note (Signed)
  Patient with history of HTN. Has not been seen or followed for this due to lack of insurance. He was prescribed norvasc in ED several months ago, was taking 10 mg with good control then ran out of prescription. Will refill 10 mg. Follow up 1 week for BP check. Nurse visit made.

## 2018-07-01 NOTE — Assessment & Plan Note (Signed)
  Skin gently debrided with 15 blade as much as patient could tolerate secondary to pain. No signs of infection. Discussed home care at length with patient. Discussed need to offload pressure from that area of foot to allow healing, avoid skin breakdown. Follow up 1 month to reassess. Patient verbalized understanding and agreement with plan.

## 2018-07-08 ENCOUNTER — Ambulatory Visit: Payer: Self-pay

## 2018-10-27 ENCOUNTER — Other Ambulatory Visit: Payer: Self-pay

## 2018-10-27 ENCOUNTER — Ambulatory Visit (INDEPENDENT_AMBULATORY_CARE_PROVIDER_SITE_OTHER): Payer: BLUE CROSS/BLUE SHIELD | Admitting: Family Medicine

## 2018-10-27 VITALS — BP 158/96 | HR 88 | Temp 97.9°F | Ht 71.0 in | Wt 196.4 lb

## 2018-10-27 DIAGNOSIS — L03032 Cellulitis of left toe: Secondary | ICD-10-CM

## 2018-10-27 MED ORDER — DOXYCYCLINE HYCLATE 100 MG PO TABS: 100.0000 mg | ORAL_TABLET | Freq: Two times a day (BID) | ORAL | 0 refills | Status: DC

## 2018-10-27 NOTE — Progress Notes (Signed)
   HPI 53 year old male who presents for left second digit toe cellulitis.  Patient has a long history of diabetic foot ulcers.  This is been a problem since at least 2017.  He has had hospitalizations for this and necessitating IV antibiotics.  Fortunately is been able to avoid amputations.  He has bad diabetic neuropathy and has essentially no sensation in his feet below his ankle.  He states that on either Friday or Saturday he noticed that his left second toe had started turning red color.  He also noted that it is intermittently "extremely painful".  He also noted that after the shower he was able to express a large amount of pus from under his toenail.  He is invading his foot hydroperoxide since expressing all the pus.  He is concerned his nail may be about to "fall off".  Patient now has insurance.  Unfortunately his new insurance is a kind that, [facility] no longer accepts.  He has established follow-up with [facility] family medicine in late February.   CC: Left second digit toe infection   ROS:   Review of Systems See HPI for ROS.   CC, SH/smoking status, and VS noted  Objective: BP (!) 158/96   Pulse 88   Temp 97.9 F (36.6 C) (Oral)   Ht 5\' 11"  (1.803 m)   Wt 196 lb 6.4 oz (89.1 kg)   SpO2 98%   BMI 27.39 kg/m  Gen: Pleasant 53 year old [place] male, resting comfortably, no acute distress CV: RRR, no murmur Resp: CTAB, no wheezes, non-labored Abd: SNTND, BS present, no guarding or organomegaly Neuro: Alert and oriented, Speech clear, No gross deficits Left foot: Warm, well-perfused.  Palpable DP/PT left foot.  Erythematous discoloration of second toe.  Unable to express any pus.  Toenail integrity intact.  Assessment and plan:  Cellulitis of toe of left foot Given patient's history of cellulitis of left toe and leg requiring hospitalization, will treat aggressively.  Given his diabetic status will cover for both Pseudomonas and staph aureus.  Begin doxycycline 100 mg  twice daily for 10 days.  Patient to keep follow-up with Dr. Maudie Mercury on 1/31 if he is still having foot problems.  Otherwise his problems resolve he is to establish care with [facility].   No orders of the defined types were placed in this encounter.   Meds ordered this encounter  Medications  . doxycycline (VIBRA-TABS) 100 MG tablet    Sig: Take 1 tablet (100 mg total) by mouth 2 (two) times daily.    Dispense:  20 tablet    Refill:  0     Guadalupe Dawn MD PGY-2 Family Medicine Resident  10/27/2018 11:28 AM

## 2018-10-27 NOTE — Assessment & Plan Note (Signed)
Given patient's history of cellulitis of left toe and leg requiring hospitalization, will treat aggressively.  Given his diabetic status will cover for both Pseudomonas and staph aureus.  Begin doxycycline 100 mg twice daily for 10 days.  Patient to keep follow-up with Dr. Maudie Mercury on 1/31 if he is still having foot problems.  Otherwise his problems resolve he is to establish care with [facility].

## 2018-10-27 NOTE — Patient Instructions (Signed)
It was great meeting you today!  I am sorry you have had so much trouble with your intermittent toe pain and discoloration.  Given that you had a large on a pus come out and it has seemingly changed to a more reddish color compared to your toes we will treat this is a skin infection of your toe.  Due to diabetes we will cover for some more atypical bacteria.  For this reason I will give you medication called doxycycline.  Take it 2 times per day for 10 days.  You have been doing a good job take care of your feet.  Continue to keep your large skin overgrowth sanded down.  You can continue to do your foot baths of hydroperoxide as these would not hurt anything.  Given the your insurance is changed sounds like you have establish care at a [facility] provider.  It is up to you to keep your appointment with Dr. Maudie Mercury on 1/31.  Please make sure you establish care with [facility] in mid February.

## 2018-11-06 ENCOUNTER — Encounter: Payer: Self-pay | Admitting: Family Medicine

## 2019-04-01 ENCOUNTER — Ambulatory Visit: Payer: Self-pay | Admitting: *Deleted

## 2019-04-01 NOTE — Telephone Encounter (Signed)
I went on vacation and one of the girls tested positive that is a daughter of the people I was with.    The positive person was not with [place] and all of her family tested negative for the COVID-19.  Do I need to be tested?  I'm not having symptoms and I was not around the daughter at all.  I let him know since he was not having symptoms and was not in contact with the girl that's positive that the best thing would be to call Dr. Zettie Cooley his PCP if she feels he needs to be tested she will order the test. He verbalized understanding and thanked me for my help.  He is going to call his PCP Dr. Maudie Mercury.   Reason for Disposition . General information question, no triage required and triager able to answer question  Answer Assessment - Initial Assessment Questions 1. REASON FOR CALL or QUESTION: "What is your reason for calling today?" or "How can I best help you?" or "What question do you have that I can help answer?"     See documentation notes.   Wondering if he needed to be tested for COVID-19.  Protocols used: INFORMATION ONLY CALL-A-AH

## 2019-05-17 ENCOUNTER — Other Ambulatory Visit: Payer: Self-pay

## 2019-05-17 DIAGNOSIS — Z20822 Contact with and (suspected) exposure to covid-19: Secondary | ICD-10-CM

## 2019-05-19 LAB — NOVEL CORONAVIRUS, NAA: SARS-CoV-2, NAA: NOT DETECTED

## 2019-06-24 ENCOUNTER — Ambulatory Visit (HOSPITAL_COMMUNITY)
Admission: EM | Admit: 2019-06-24 | Discharge: 2019-06-24 | Disposition: A | Discharge status: Home / Self Care | Payer: BLUE CROSS/BLUE SHIELD | Attending: Family Medicine | Admitting: Family Medicine

## 2019-06-24 ENCOUNTER — Other Ambulatory Visit: Payer: Self-pay

## 2019-06-24 ENCOUNTER — Encounter (HOSPITAL_COMMUNITY): Payer: Self-pay

## 2019-06-24 ENCOUNTER — Ambulatory Visit (INDEPENDENT_AMBULATORY_CARE_PROVIDER_SITE_OTHER): Payer: BLUE CROSS/BLUE SHIELD

## 2019-06-24 DIAGNOSIS — S62663A Nondisplaced fracture of distal phalanx of left middle finger, initial encounter for closed fracture: Secondary | ICD-10-CM

## 2019-06-24 MED ORDER — TRAMADOL HCL 50 MG PO TABS: 50.0000 mg | ORAL_TABLET | Freq: Four times a day (QID) | ORAL | 0 refills | Status: DC | PRN

## 2019-06-24 NOTE — ED Triage Notes (Signed)
Pt states 2 weeks ago he got his finger smashed in a sliding door. ( right hand middle finger pain )

## 2019-06-24 NOTE — ED Provider Notes (Signed)
[place]    CSN: [id number] Arrival date & time: 06/24/19  [id number]      History   Chief Complaint Chief Complaint  Patient presents with  . Hand Pain    HPI [name] is a 53 y.o. male.   This is the initial visit for this 53 year old man with a right hand injury.  He has chronic diabetes and hypertension.  Pt states 2 weeks ago he got his finger smashed in a sliding door. ( right hand middle finger pain ).  Has not tried ibuprofen.  Worried about some small white spots on side of finger as well as faint dark line medially extending from the cuticle.     Past Medical History:  Diagnosis Date  . Allergy   . Diabetes mellitus    Type 2 DM, Patient reports DM Type 1 diagnosd age at age 64  . Diabetic foot ulcers ([place])   . Foreign body in left foot    with  infection  . Hypertension   . Meniscus tear   . Multiple rib fractures   . Personal history of diabetic foot ulcer, Left Foot     Patient Active Problem List   Diagnosis Date Noted  . Corn of foot 07/01/2018  . Cellulitis of toe of left foot   . Hyperlipidemia 05/28/2016  . Hypertriglyceridemia 05/28/2016  . Diabetic foot ulcers ([place])   . Adjustment disorder with anxiety 12/09/2014  . Erectile dysfunction 10/21/2012  . HTN (hypertension), benign 04/14/2012  . DM (diabetes mellitus), type 2, uncontrolled ([place]) 04/13/2012    Past Surgical History:  Procedure Laterality Date  . EYE SURGERY    . I&D EXTREMITY  04/23/2012   Procedure: IRRIGATION AND DEBRIDEMENT EXTREMITY;  Surgeon: Newt Minion, MD;  Location: [facility];  Service: Orthopedics;  Laterality: Left;  Irrigation and Debridement Left Foot  . I&D EXTREMITY  09/18/2012   Procedure: IRRIGATION AND DEBRIDEMENT EXTREMITY;  Surgeon: Newt Minion, MD;  Location: [facility];  Service: Orthopedics;  Laterality: Left;  . MENISCUS REPAIR Left 04/2016   Orthopedist, Dr [name] in [place], [place]       Home Medications    Prior to Admission  medications   Medication Sig Start Date End Date Taking? Authorizing Provider  amLODipine (NORVASC) 10 MG tablet Take 1 tablet (10 mg total) by mouth daily. 07/01/18 07/31/18  Steve Rattler, DO  atorvastatin (LIPITOR) 40 MG tablet Take 1 tablet (40 mg total) by mouth daily. 04/14/18   Duffy Bruce, MD  doxycycline (VIBRA-TABS) 100 MG tablet Take 1 tablet (100 mg total) by mouth 2 (two) times daily. 10/27/18   Guadalupe Dawn, MD  ibuprofen (ADVIL,MOTRIN) 200 MG tablet Take 400 mg by mouth every 6 (six) hours as needed for fever or moderate pain.    [provider]  insulin NPH Human (HUMULIN N,NOVOLIN N) 100 UNIT/ML injection Inject 0.04-0.12 mLs (4-12 Units total) into the skin 2 (two) times daily before a meal. 04/14/18   Duffy Bruce, MD  metFORMIN (GLUCOPHAGE) 1000 MG tablet Take 1 tablet (1,000 mg total) by mouth 2 (two) times daily with a meal. 07/01/18   Riccio, Angela C, DO  urea 10 % lotion Apply topically 2 (two) times daily. Patient not taking: Reported on 09/03/2016 07/18/16   Marjie Skiff, MD    Family History Family History  Problem Relation Age of Onset  . Diabetes type II Mother   . Arthritis Mother   . Diabetes Mother   . Hyperlipidemia  Mother   . Hypertension Mother   . Diabetes type II Sister   . Diabetes Father     Social History Social History   Tobacco Use  . Smoking status: Former Smoker    Types: Cigarettes    Quit date: 09/03/2012    Years since quitting: 6.8  . Smokeless tobacco: Former Systems developer    Quit date: 09/03/2012  Substance Use Topics  . Alcohol use: Yes    Alcohol/week: 8.0 standard drinks    Types: 6 Cans of beer, 2 Shots of liquor per week    Comment: weekends.  . Drug use: No     Allergies   Latex   Review of Systems Review of Systems  Skin: Positive for wound.  All other systems reviewed and are negative.    Physical Exam Triage Vital Signs ED Triage Vitals  Enc Vitals Group     BP 06/24/19 1044 (!) 169/93      Pulse Rate 06/24/19 1044 75     Resp 06/24/19 1044 18     Temp 06/24/19 1044 98.7 F (37.1 C)     Temp Source 06/24/19 1044 Oral     SpO2 06/24/19 1044 99 %     Weight 06/24/19 1043 200 lb (90.7 kg)     Height --      Head Circumference --      Peak Flow --      Pain Score 06/24/19 1043 9     Pain Loc --      Pain Edu? --      Excl. in [place]? --    No data found.  Updated Vital Signs BP (!) 169/93 (BP Location: Right Arm)   Pulse 75   Temp 98.7 F (37.1 C) (Oral)   Resp 18   Wt 90.7 kg   SpO2 99%   BMI 27.89 kg/m    Physical Exam Vitals signs and nursing note reviewed.  Constitutional:      General: He is not in acute distress.    Appearance: Normal appearance. He is normal weight. He is not ill-appearing.  Eyes:     Conjunctiva/sclera: Conjunctivae normal.  Neck:     Musculoskeletal: Normal range of motion and neck supple.  Pulmonary:     Effort: Pulmonary effort is normal.  Musculoskeletal: Normal range of motion.        General: Tenderness and signs of injury present. No swelling or deformity.     Comments: She is able to flex and extend the middle finger on his right hand without much difficulty.  He does have a subungual hematoma there and the discoloration that he noticed is very minimal.  Neurological:     General: No focal deficit present.     Mental Status: He is alert.  Psychiatric:        Mood and Affect: Mood normal.        UC Treatments / Results  Labs (all labs ordered are listed, but only abnormal results are displayed) Labs Reviewed - No data to display  EKG   Radiology No results found.  Procedures Procedures (including critical care time)  Medications Ordered in UC Medications - No data to display  Initial Impression / Assessment and Plan / UC Course  I have reviewed the triage vital signs and the nursing notes.  Pertinent labs & imaging results that were available during my care of the patient were reviewed by me and  considered in my medical decision making (see chart for details).  Final Clinical Impressions(s) / UC Diagnoses   Final diagnoses:  Nondisplaced fracture of distal phalanx of left middle finger, initial encounter for closed fracture     Discharge Instructions     You have a small fracture in the middle of the last bone of your middle finger.  This is causing the continued pain and discoloration that you have noticed.  I want you to follow-up with the hand orthopedic to make sure this finger heals properly.  In the meantime, keep it splinted as we have shown you.    ED Prescriptions    None     Controlled Substance Prescriptions [place] Controlled Substance Registry consulted? Not Applicable   Robyn Haber, MD 06/24/19 1135

## 2019-06-24 NOTE — Discharge Instructions (Signed)
You have a small fracture in the middle of the last bone of your middle finger.  This is causing the continued pain and discoloration that you have noticed.  I want you to follow-up with the hand orthopedic to make sure this finger heals properly.  In the meantime, keep it splinted as we have shown you.

## 2019-11-05 ENCOUNTER — Ambulatory Visit: Payer: BLUE CROSS/BLUE SHIELD | Admitting: Family Medicine

## 2020-02-22 ENCOUNTER — Ambulatory Visit (INDEPENDENT_AMBULATORY_CARE_PROVIDER_SITE_OTHER): Payer: Self-pay | Admitting: Family Medicine

## 2020-02-22 ENCOUNTER — Encounter: Payer: Self-pay | Admitting: Family Medicine

## 2020-02-22 ENCOUNTER — Other Ambulatory Visit: Payer: Self-pay

## 2020-02-22 VITALS — BP 134/82 | HR 88 | Wt 188.6 lb

## 2020-02-22 DIAGNOSIS — E785 Hyperlipidemia, unspecified: Secondary | ICD-10-CM

## 2020-02-22 DIAGNOSIS — L84 Corns and callosities: Secondary | ICD-10-CM

## 2020-02-22 DIAGNOSIS — I10 Essential (primary) hypertension: Secondary | ICD-10-CM

## 2020-02-22 DIAGNOSIS — E1165 Type 2 diabetes mellitus with hyperglycemia: Secondary | ICD-10-CM

## 2020-02-22 LAB — POCT GLYCOSYLATED HEMOGLOBIN (HGB A1C): HbA1c, POC (controlled diabetic range): 6.3 % (ref 0.0–7.0)

## 2020-02-22 MED ORDER — ROSUVASTATIN CALCIUM 20 MG PO TABS: 20.0000 mg | ORAL_TABLET | Freq: Every day | ORAL | 3 refills | Status: DC

## 2020-02-22 MED ORDER — PEN NEEDLES 30G X 5 MM MISC: 1.0000 [IU] | Freq: Every day | 3 refills | Status: DC

## 2020-02-22 MED ORDER — BLOOD GLUCOSE MONITOR SYSTEM W/DEVICE KIT: 1.0000 | PACK | Freq: Every day | 0 refills | Status: DC

## 2020-02-22 MED ORDER — NOVOLOG FLEXPEN 100 UNIT/ML ~~LOC~~ SOPN: 5.0000 [IU] | PEN_INJECTOR | Freq: Every day | SUBCUTANEOUS | 2 refills | Status: DC

## 2020-02-22 NOTE — Patient Instructions (Signed)
Dear Coy Saunas,   It was good to see you! Thank you for taking your time to come in to be seen. Today, we discussed the following:   Hypertension Take your blood pressures first thing in the morning before coffee or at night right before bed when you are relaxed. Please track this for one week and bring back the numbers to your next appointment   - schedule an appointment with your eye doctor   High cholesterol  Starting statin, rosuvastatin   Sent you new diabetes monitoring kit. It should come with lancets and test strips. If it doesn't, please ask the pharmacy which brand your insurance covers.   For all labs obtained today, I will send results via [place].  If anything is abnormal, we will call you for details on further management.  Please follow up in one week or sooner for concerning or worsening symptoms. Please bring all of your medications to your next appointment.   You are due for the following Health Maintenance items. Please schedule an appointment to address these prior to leaving.  Health Maintenance Due  Topic Date Due  . PNEUMOCOCCAL POLYSACCHARIDE VACCINE AGE 66-64 HIGH RISK  Never done  . FOOT EXAM  Never done  . OPHTHALMOLOGY EXAM  Never done  . HIV Screening  Never done  . COVID-19 Vaccine (1) Never done  . URINE MICROALBUMIN  05/27/2017  . COLONOSCOPY  Never done   Be well,   Zettie Cooley, M.D   [facility] [phone]  *Sign up for MyChart for instant access to your health profile, labs, orders, upcoming appointments or to contact your provider with questions*  ===================================================================================

## 2020-02-22 NOTE — Progress Notes (Signed)
    SUBJECTIVE:  CHIEF COMPLAINT / HPI:  Patient has not been followed closely by [facility] due to insurance change. Now wanting to continue care here.   Blood Pressure  200/100 every moning / afternoon (1300) 185/110  193/109 132/88  Patient denies chest pain, but has been worried about these high numbers. Usually takes BP after making his wife breakfast and sits down to have coffee.   Diabetes  When under 110- patient starts to get jitters, etc.  Sliding scale at home 5-15 units aspart once daily. Titrate according to CBG in the morning.  Takes novolog in the AM usually when its high -- w/ sliding scale. Once daily. Does not typically do fasting CBGs.  After dinner 171-180  In the mornings 120's.  Patient does report some visual changes   Previous labs in Care Everywhere:  A1C 11/18/18 15.1  LDL 209, Tot chol 296, triglyc 574  Neuropathy  Patient complaining of pain of extremities.   Callous Patient with callous on foot. No ulceration.   Adult vaccines due  Topic Date Due  . PNEUMOCOCCAL POLYSACCHARIDE VACCINE AGE 62-64 HIGH RISK  Never done  . TETANUS/TDAP  04/13/2022    PERTINENT  PMH / PSH: Poorly controlled DM, HTN, HLD   Medication List was reviewed and updated.   OBJECTIVE:  BP 134/82   Pulse 88   Wt 188 lb 9.6 oz (85.5 kg)   SpO2 98%   BMI 26.30 kg/m   General: well appearing male, much thinner than appears in profile photograph  Chest: RRR, No m/r/g Lungs: CTAB  MSK: No BLEE   ASSESSMENT/PLAN:  HTN (hypertension), benign Patient with high blood pressures at home, but 134/82 today. Did not start any new medications. Patent to monitor BP at home before coffee. Am and before bed.   DM (diabetes mellitus), type 2, uncontrolled ([place]) Patient's A1C is within goal.  Patient does not have his own medications. Ordered glucometer kit, and new pen w/ pen needles. Will continue at this time. Patient would may be a candidate to transition to oral medication, given such  small dose of aspart daily. Also started losartan. Will follow up and discuss with patient at next visit in two weeks. Also started rosuvastatin    Hyperlipidemia Started rosuvastatin    Other issues to address at next visit:   - neuropathy - hx PTSD  Wilber Oliphant, MD [place]

## 2020-02-23 ENCOUNTER — Telehealth: Payer: Self-pay | Admitting: Family Medicine

## 2020-02-23 LAB — COMPREHENSIVE METABOLIC PANEL
ALT: 14 IU/L (ref 0–44)
AST: 18 IU/L (ref 0–40)
Albumin/Globulin Ratio: 1.1 — ABNORMAL LOW (ref 1.2–2.2)
Albumin: 2.9 g/dL — ABNORMAL LOW (ref 3.8–4.9)
Alkaline Phosphatase: 110 IU/L (ref 48–121)
BUN/Creatinine Ratio: 9 (ref 9–20)
BUN: 30 mg/dL — ABNORMAL HIGH (ref 6–24)
Bilirubin Total: 0.2 mg/dL (ref 0.0–1.2)
CO2: 21 mmol/L (ref 20–29)
Calcium: 8.5 mg/dL — ABNORMAL LOW (ref 8.7–10.2)
Chloride: 108 mmol/L — ABNORMAL HIGH (ref 96–106)
Creatinine, Ser: 3.28 mg/dL — ABNORMAL HIGH (ref 0.76–1.27)
GFR calc Af Amer: 24 mL/min/{1.73_m2} — ABNORMAL LOW (ref >59)
GFR calc non Af Amer: 21 mL/min/{1.73_m2} — ABNORMAL LOW (ref >59)
Globulin, Total: 2.6 g/dL (ref 1.5–4.5)
Glucose: 95 mg/dL (ref 65–99)
Potassium: 5.2 mmol/L (ref 3.5–5.2)
Sodium: 141 mmol/L (ref 134–144)
Total Protein: 5.5 g/dL — ABNORMAL LOW (ref 6.0–8.5)

## 2020-02-23 LAB — LIPID PANEL
Chol/HDL Ratio: 7.4 ratio — ABNORMAL HIGH (ref 0.0–5.0)
Cholesterol, Total: 303 mg/dL — ABNORMAL HIGH (ref 100–199)
HDL: 41 mg/dL (ref >39)
LDL Chol Calc (NIH): 210 mg/dL — ABNORMAL HIGH (ref 0–99)
Triglycerides: 260 mg/dL — ABNORMAL HIGH (ref 0–149)
VLDL Cholesterol Cal: 52 mg/dL — ABNORMAL HIGH (ref 5–40)

## 2020-02-23 LAB — CBC WITH DIFFERENTIAL
Basophils Absolute: 0.1 10*3/uL (ref 0.0–0.2)
Basos: 1 %
EOS (ABSOLUTE): 0.4 10*3/uL (ref 0.0–0.4)
Eos: 4 %
Hematocrit: 46.4 % (ref 37.5–51.0)
Hemoglobin: 15.7 g/dL (ref 13.0–17.7)
Immature Grans (Abs): 0 10*3/uL (ref 0.0–0.1)
Immature Granulocytes: 0 %
Lymphocytes Absolute: 1.5 10*3/uL (ref 0.7–3.1)
Lymphs: 15 %
MCH: 33 pg (ref 26.6–33.0)
MCHC: 33.8 g/dL (ref 31.5–35.7)
MCV: 98 fL — ABNORMAL HIGH (ref 79–97)
Monocytes Absolute: 0.6 10*3/uL (ref 0.1–0.9)
Monocytes: 6 %
Neutrophils Absolute: 7.3 10*3/uL — ABNORMAL HIGH (ref 1.4–7.0)
Neutrophils: 74 %
RBC: 4.76 x10E6/uL (ref 4.14–5.80)
RDW: 13.2 % (ref 11.6–15.4)
WBC: 10 10*3/uL (ref 3.4–10.8)

## 2020-02-23 NOTE — Telephone Encounter (Signed)
Pt is stopping by the office to let the doctor know that he can get any of his diabetic supplies with his insurance. Please call him to discuss

## 2020-02-28 ENCOUNTER — Encounter: Payer: Self-pay | Admitting: Family Medicine

## 2020-02-28 NOTE — Assessment & Plan Note (Signed)
Started rosuvastatin

## 2020-02-28 NOTE — Assessment & Plan Note (Addendum)
Patient's A1C is within goal.  Patient does not have his own medications. Ordered glucometer kit, and new pen w/ pen needles. Will continue at this time. Patient would may be a candidate to transition to oral medication, given such small dose of aspart daily. Also started losartan. Will follow up and discuss with patient at next visit in two weeks. Also started rosuvastatin

## 2020-02-28 NOTE — Assessment & Plan Note (Addendum)
Patient with high blood pressures at home, but 134/82 today. Did not start any new medications. Patent to monitor BP at home before coffee. Am and before bed.

## 2020-02-29 NOTE — Telephone Encounter (Signed)
Called patient and left voicemail to ask about DM kit issues. LVM

## 2020-03-01 ENCOUNTER — Encounter: Payer: Self-pay | Admitting: Family Medicine

## 2020-03-01 ENCOUNTER — Other Ambulatory Visit: Payer: Self-pay

## 2020-03-01 ENCOUNTER — Ambulatory Visit (INDEPENDENT_AMBULATORY_CARE_PROVIDER_SITE_OTHER): Payer: 59 | Admitting: Family Medicine

## 2020-03-01 VITALS — BP 200/114 | HR 85 | Ht 71.0 in | Wt 190.4 lb

## 2020-03-01 DIAGNOSIS — I10 Essential (primary) hypertension: Secondary | ICD-10-CM | POA: Diagnosis not present

## 2020-03-01 DIAGNOSIS — E1165 Type 2 diabetes mellitus with hyperglycemia: Secondary | ICD-10-CM

## 2020-03-01 MED ORDER — AMLODIPINE BESYLATE 10 MG PO TABS
10.0000 mg | ORAL_TABLET | Freq: Every day | ORAL | 3 refills | Status: DC
Start: 2020-03-01 — End: 2020-07-25

## 2020-03-01 MED ORDER — GLIPIZIDE ER 5 MG PO TB24: 5.0000 mg | ORAL_TABLET | Freq: Every day | ORAL | 3 refills | Status: DC

## 2020-03-01 NOTE — Progress Notes (Addendum)
    SUBJECTIVE:   CHIEF COMPLAINT / HPI:   High blood pressure Patient reports that he has had high blood pressures at home since his PCP, Dr. Maudie Mercury, asked him to check them during their appointment on 5/18.  His blood pressures have been around 200/100 consistently at home.  He is not currently taking any blood pressure medications.  He denies blurry vision but does report a headache.  He has been using aspirin to relieve his headache.  He has not been taking any NSAIDs.  He says that he does not know how to get into his MyChart, so he did not realize that his kidney function had been abnormal from his lab on 5/18.  He has tolerated amlodipine in the past but did have a dry cough with ACE inhibitors.  Type 2 diabetes Patient reports that he cannot tolerate Metformin, and he is having some financial strain currently, so he is only taking NovoLog for high blood sugars.  His blood sugars have been in the low to mid 100s for the most part.  He denies hypo or hyperglycemic symptoms.  He has not yet started rosuvastatin.  PERTINENT  PMH / PSH: Type 2 diabetes, hypertension, hypertriglyceridemia  OBJECTIVE:   BP (!) 200/114   Pulse 85   Ht 5\' 11"  (1.803 m)   Wt 190 lb 6.4 oz (86.4 kg)   SpO2 99%   BMI 26.56 kg/m   General: well appearing, appears stated age Cardiac: RRR, no MRG Respiratory: CTAB, no rhonchi, rales, or wheezing, normal work of breathing Skin: no rashes or other lesions, warm and well perfused Psych: appropriate mood and affect   ASSESSMENT/PLAN:   HTN (hypertension), benign Very poorly controlled today.  Due to his acute kidney injury on 5/18, will withhold any ACE inhibitor's or ARB's due to this.  We will also recheck a BMP today.  We will start amlodipine 10 mg once daily and have him follow-up on Tuesday with one of our providers.  We will also have him see Dr. Valentina Lucks on Thursday, June 3 for further management and possible ambulatory blood pressure monitoring.  Counseled  patient to abstain from taking any NSAIDs and to also not take any Metformin due to his kidney injury.  DM (diabetes mellitus), type 2, uncontrolled ([place]) Much better control on 5/18 with a hemoglobin A1c of 6.3, but his method of management is suboptimal since he is only taking NovoLog for high blood sugars.  Also concerned that he will run out of insulin completely this month, and given his previous poor control, he does need some medication for his blood sugar.  Therefore, we will start glipizide 5 mg once daily since this will be an affordable option for him.  Counseled him not to take any rosuvastatin at this time due to his significant AKI.   ACE inhibitors added to patient's allergy list.  Kathrene Alu, MD [place]

## 2020-03-01 NOTE — Assessment & Plan Note (Addendum)
Very poorly controlled today.  Due to his acute kidney injury on 5/18, will withhold any ACE inhibitor's or ARB's.  We will also recheck a BMP today.  We will start amlodipine 10 mg once daily and have him follow-up on Tuesday with one of our providers.  We will also have him see Dr. Valentina Lucks on Thursday, June 3 for further management and possible ambulatory blood pressure monitoring.  Counseled patient to abstain from taking any NSAIDs and to also not take any Metformin due to his kidney injury.

## 2020-03-01 NOTE — Patient Instructions (Signed)
It was nice meeting you today [name]!  For your high blood pressure, we are starting amlodipine 1 pill/day.  It will be very important for you to follow-up in our clinic with whoever has openings on Tuesday, June 1.  I would also like for you to make an appointment with our blood pressure specialist, Dr. Valentina Lucks, on Thursday, June 3.  We are checking your kidney function again today since it has worsened, likely due to your high blood pressure.  For your diabetes, please stop taking insulin.  We will start glipizide 5 mg once per day.  This medication can cause low blood sugar, so make sure to eat after taking this medicine.  Please follow-up with your PCP regarding your blood pressure and diabetes.  If you have any questions or concerns, please feel free to call the clinic.   Be well,  Dr. Shan Levans

## 2020-03-01 NOTE — Assessment & Plan Note (Signed)
Referral to podiatry. No ulcer or infection present. 1.5 inch in diameter at ball of foot.

## 2020-03-01 NOTE — Assessment & Plan Note (Signed)
Much better control on 5/18 with a hemoglobin A1c of 6.3, but his method of management is suboptimal since he is only taking NovoLog for high blood sugars.  Also concerned that he will run out of insulin completely this month, and given his previous poor control, he does need some medication for his blood sugar.  Therefore, we will start glipizide 5 mg once daily since this will be an affordable option for him.  Counseled him not to take any rosuvastatin at this time due to his significant AKI.

## 2020-03-02 LAB — BASIC METABOLIC PANEL
BUN/Creatinine Ratio: 9 (ref 9–20)
BUN: 32 mg/dL — ABNORMAL HIGH (ref 6–24)
CO2: 19 mmol/L — ABNORMAL LOW (ref 20–29)
Calcium: 8.6 mg/dL — ABNORMAL LOW (ref 8.7–10.2)
Chloride: 110 mmol/L — ABNORMAL HIGH (ref 96–106)
Creatinine, Ser: 3.65 mg/dL — ABNORMAL HIGH (ref 0.76–1.27)
GFR calc Af Amer: 21 mL/min/{1.73_m2} — ABNORMAL LOW (ref >59)
GFR calc non Af Amer: 18 mL/min/{1.73_m2} — ABNORMAL LOW (ref >59)
Glucose: 194 mg/dL — ABNORMAL HIGH (ref 65–99)
Potassium: 5.4 mmol/L — ABNORMAL HIGH (ref 3.5–5.2)
Sodium: 141 mmol/L (ref 134–144)

## 2020-03-07 ENCOUNTER — Other Ambulatory Visit: Payer: Self-pay

## 2020-03-07 ENCOUNTER — Encounter: Payer: Self-pay | Admitting: Family Medicine

## 2020-03-07 ENCOUNTER — Ambulatory Visit (INDEPENDENT_AMBULATORY_CARE_PROVIDER_SITE_OTHER): Payer: 59 | Admitting: Family Medicine

## 2020-03-07 VITALS — BP 170/84 | HR 88 | Ht 70.0 in | Wt 194.0 lb

## 2020-03-07 DIAGNOSIS — I10 Essential (primary) hypertension: Secondary | ICD-10-CM

## 2020-03-07 DIAGNOSIS — R319 Hematuria, unspecified: Secondary | ICD-10-CM | POA: Insufficient documentation

## 2020-03-07 DIAGNOSIS — E1165 Type 2 diabetes mellitus with hyperglycemia: Secondary | ICD-10-CM | POA: Diagnosis not present

## 2020-03-07 DIAGNOSIS — R3121 Asymptomatic microscopic hematuria: Secondary | ICD-10-CM

## 2020-03-07 DIAGNOSIS — L84 Corns and callosities: Secondary | ICD-10-CM | POA: Diagnosis not present

## 2020-03-07 DIAGNOSIS — N185 Chronic kidney disease, stage 5: Secondary | ICD-10-CM | POA: Insufficient documentation

## 2020-03-07 DIAGNOSIS — E1159 Type 2 diabetes mellitus with other circulatory complications: Secondary | ICD-10-CM

## 2020-03-07 DIAGNOSIS — N184 Chronic kidney disease, stage 4 (severe): Secondary | ICD-10-CM | POA: Diagnosis not present

## 2020-03-07 DIAGNOSIS — Z114 Encounter for screening for human immunodeficiency virus [HIV]: Secondary | ICD-10-CM

## 2020-03-07 LAB — POCT URINALYSIS DIP (MANUAL ENTRY)
Bilirubin, UA: NEGATIVE
Glucose, UA: 100 mg/dL — AB
Ketones, POC UA: NEGATIVE mg/dL
Leukocytes, UA: NEGATIVE
Nitrite, UA: NEGATIVE
Protein Ur, POC: >=300 mg/dL — AB
Spec Grav, UA: 1.025 (ref 1.010–1.025)
Urobilinogen, UA: 0.2 E.U./dL
pH, UA: 6 (ref 5.0–8.0)

## 2020-03-07 LAB — POCT UA - MICROALBUMIN
Albumin/Creatinine Ratio, Urine, POC: >300
Creatinine, POC: 100 mg/dL
Microalbumin Ur, POC: 150 mg/L

## 2020-03-07 MED ORDER — LABETALOL HCL 100 MG PO TABS: 100.0000 mg | ORAL_TABLET | Freq: Two times a day (BID) | ORAL | 1 refills | Status: DC

## 2020-03-07 NOTE — Assessment & Plan Note (Signed)
Improved but still not at goal. -Labetalol

## 2020-03-07 NOTE — Progress Notes (Signed)
° ° °  SUBJECTIVE:   CHIEF COMPLAINT / HPI:   Hypertension follow-up Patient is following up for hypertension. Says he initially was not tolerating the amlodipine and may feel dizzy but he continued to take it and now he has been tolerating the amlodipine well. Denies any headache, blurry vision, chest pain, shortness of breath, lower extremity swelling.  Diabetes mellitus Patient also following up on diabetes mellitus. Was previously started on glipizide. Reports that his fasting sugars transition from the 200s to being in the 80s and 90s. Denies polyuria polydipsia.  CKD 4 with proteinuria Patient was found to have significant creatinine increase from in the 1"s last year to mid-3's over the past 2 weeks. Diabetes and hypertension modification was initiated. It was recommended that patient be referred to nephrology, however this referral does not seem to be in place at the moment.   Left foot callus Patient says a large callus on his left foot. Is been there for several years. He has been soaking it and kind file it down but without any improvement. Patient states that he had surgery on the bottom of his foot or the calluses several years ago. States that it affects his gait. Is not painful when he is not walking. No fevers or chills. No purulent drainage.  OBJECTIVE:   BP (!) 170/84    Pulse 88    Ht 5\' 10"  (1.778 m)    Wt 194 lb (88 kg)    SpO2 98%    BMI 27.84 kg/m    Gen: NAD, resting comfortably CV: RRR with no murmurs appreciated Pulm: NWOB, CTAB with no crackles, wheezes, or rhonchi MSK: edema +1 bilateral to mid shin, no cyanosis, or clubbing noted, marked callus on left foot under the ball, patient previously had Skin: warm, dry Neuro: grossly normal, moves all extremities Psych: Normal affect and thought content   ASSESSMENT/PLAN:   Hypertension associated with diabetes ([facility]) Improved but still not at goal. -Labetalol   DM (diabetes mellitus), type 2, uncontrolled  ([facility]) Improved fasting glipizide. Continue to monitor. Patient will follow-up with Dr. Valentina Lucks  CKD (chronic kidney disease) stage 4, GFR 15-29 ml/min ([facility]) In the process of optimizing blood pressure. Diabetes improved. - -Urgent referral to nephrology placed -Urine protein creatinine -CBC   Foot callus Referral to podiatry.  Hematuria Small hematuria in the setting of renal disease. - Recommend repeating after seeing urology - May need an renal ultrasound and referral to urology if persistant     Earl Hollow, MD [place]

## 2020-03-07 NOTE — Assessment & Plan Note (Signed)
Improved fasting glipizide. Continue to monitor. Patient will follow-up with Dr. Valentina Lucks

## 2020-03-07 NOTE — Patient Instructions (Signed)
For your blood pressure we are starting you on labetalol and referring you to the kidney doctor.  For your callus we are referring you to podiatry.  Please keep your appointment with Dr. Maudie Mercury on Friday for another blood pressure check.  We are checking some lab work today.

## 2020-03-07 NOTE — Assessment & Plan Note (Signed)
Referral to podiatry  

## 2020-03-07 NOTE — Assessment & Plan Note (Signed)
In the process of optimizing blood pressure. Diabetes improved. - -Urgent referral to nephrology placed -Urine protein creatinine -CBC

## 2020-03-07 NOTE — Assessment & Plan Note (Signed)
Small hematuria in the setting of renal disease. - Recommend repeating after seeing urology - May need an renal ultrasound and referral to urology if persistant

## 2020-03-08 LAB — CBC WITH DIFFERENTIAL/PLATELET
Basophils Absolute: 0.2 10*3/uL (ref 0.0–0.2)
Basos: 1 %
EOS (ABSOLUTE): 0.7 10*3/uL — ABNORMAL HIGH (ref 0.0–0.4)
Eos: 7 %
Hematocrit: 44.2 % (ref 37.5–51.0)
Hemoglobin: 14.9 g/dL (ref 13.0–17.7)
Immature Grans (Abs): 0.1 10*3/uL (ref 0.0–0.1)
Immature Granulocytes: 1 %
Lymphocytes Absolute: 1.3 10*3/uL (ref 0.7–3.1)
Lymphs: 12 %
MCH: 32.3 pg (ref 26.6–33.0)
MCHC: 33.7 g/dL (ref 31.5–35.7)
MCV: 96 fL (ref 79–97)
Monocytes Absolute: 0.7 10*3/uL (ref 0.1–0.9)
Monocytes: 7 %
Neutrophils Absolute: 7.6 10*3/uL — ABNORMAL HIGH (ref 1.4–7.0)
Neutrophils: 72 %
Platelets: 216 10*3/uL (ref 150–450)
RBC: 4.61 x10E6/uL (ref 4.14–5.80)
RDW: 13.1 % (ref 11.6–15.4)
WBC: 10.5 10*3/uL (ref 3.4–10.8)

## 2020-03-08 LAB — HIV ANTIBODY (ROUTINE TESTING W REFLEX): HIV Screen 4th Generation wRfx: NONREACTIVE

## 2020-03-09 ENCOUNTER — Ambulatory Visit: Payer: 59 | Admitting: Pharmacist

## 2020-03-10 ENCOUNTER — Ambulatory Visit: Payer: Self-pay | Admitting: Family Medicine

## 2020-03-22 ENCOUNTER — Other Ambulatory Visit: Payer: Self-pay

## 2020-03-22 ENCOUNTER — Ambulatory Visit (INDEPENDENT_AMBULATORY_CARE_PROVIDER_SITE_OTHER): Payer: 59 | Admitting: Podiatry

## 2020-03-22 DIAGNOSIS — L97522 Non-pressure chronic ulcer of other part of left foot with fat layer exposed: Secondary | ICD-10-CM | POA: Diagnosis not present

## 2020-03-22 DIAGNOSIS — E10621 Type 1 diabetes mellitus with foot ulcer: Secondary | ICD-10-CM | POA: Diagnosis not present

## 2020-03-22 MED ORDER — GABAPENTIN 100 MG PO CAPS
100.0000 mg | ORAL_CAPSULE | Freq: Three times a day (TID) | ORAL | 3 refills | Status: DC
Start: 2020-03-22 — End: 2020-08-15

## 2020-03-23 ENCOUNTER — Encounter: Payer: Self-pay | Admitting: Podiatry

## 2020-03-23 NOTE — Progress Notes (Signed)
Subjective:  Patient ID: [name], male    DOB: 10-26-1965,  MRN: [id number]  Chief Complaint  Patient presents with  . Callouses    pt is here for a callous trim    54 y.o. male presents for wound care.  Patient presents with left submet 1 preulcerative lesion that has been developing for quite some time.  Patient is type II diabetic with last A1c of 6.3.  Patient states he ambulates a lot on his foot.  He has had been dealing with this callus for a long period of time.  He would like to get it evaluated make sure that there is no ulceration underneath.  He denies any other acute complaints.  He has not been doing anything besides soaks and pumice stone to help debrided down to keep it down.  He denies any other acute complaints.   Review of Systems: Negative except as noted in the HPI. Denies N/V/F/Ch.  Past Medical History:  Diagnosis Date  . Allergy   . Diabetes mellitus    Type 2 DM, Patient reports DM Type 1 diagnosd age at age 55  . Diabetic foot ulcers ([place])   . Foreign body in left foot    with  infection  . Hypertension   . Meniscus tear   . Multiple rib fractures   . Personal history of diabetic foot ulcer, Left Foot     Current Outpatient Medications:  .  amLODipine (NORVASC) 10 MG tablet, Take 1 tablet (10 mg total) by mouth at bedtime., Disp: 90 tablet, Rfl: 3 .  Blood Glucose Monitoring Suppl (BLOOD GLUCOSE MONITOR SYSTEM) w/Device KIT, 1 kit by Does not apply route daily., Disp: 1 kit, Rfl: 0 .  glipiZIDE (GLUCOTROL XL) 5 MG 24 hr tablet, Take 1 tablet (5 mg total) by mouth daily., Disp: 90 tablet, Rfl: 3 .  labetalol (NORMODYNE) 100 MG tablet, Take 1 tablet (100 mg total) by mouth 2 (two) times daily., Disp: 60 tablet, Rfl: 1 .  gabapentin (NEURONTIN) 100 MG capsule, Take 1 capsule (100 mg total) by mouth 3 (three) times daily., Disp: 90 capsule, Rfl: 3  Social History   Tobacco Use  Smoking Status Former Smoker  . Types: Cigarettes  . Quit date:  09/03/2012  . Years since quitting: 7.5  Smokeless Tobacco Former Systems developer  . Quit date: 09/03/2012    Allergies  Allergen Reactions  . Latex Rash    Pt states he is allergic to latex condoms  . Ace Inhibitors Cough   Objective:  There were no vitals filed for this visit. There is no height or weight on file to calculate BMI. Constitutional Well developed. Well nourished.  Vascular Dorsalis pedis pulses palpable bilaterally. Posterior tibial pulses palpable bilaterally. Capillary refill normal to all digits.  No cyanosis or clubbing noted. Pedal hair growth normal.  Neurologic Normal speech. Oriented to person, place, and time. Protective sensation absent  Dermatologic Wound Location: Left submetatarsal 1 ulcer with fat layer exposed Wound Base: Mixed Granular/Fibrotic Peri-wound: Calloused Exudate: Scant/small amount Serosanguinous exudate Wound Measurements: -See below  Orthopedic: No pain to palpation either foot.   Radiographs: None Assessment:   1. Diabetic ulcer of toe of left foot associated with type 1 diabetes mellitus, with fat layer exposed ([place])    Plan:  Patient was evaluated and treated and all questions answered.  Ulcer submetatarsal 1 fat layer exposed -Debridement as below. -Dressed with triple antibiotic and Band-Aid, DSD. -Continue off-loading with surgical shoe.  Procedure: Excisional Debridement  of Wound Tool: Sharp chisel blade/tissue nipper Rationale: Removal of non-viable soft tissue from the wound to promote healing.  Anesthesia: none Pre-Debridement Wound Measurements: 0.5 cm x 0.5 cm x 0.3 cm  Post-Debridement Wound Measurements: 0.6 cm x 0.5 cm x 0.3 cm  Type of Debridement: Sharp Excisional Tissue Removed: Non-viable soft tissue Blood loss: Minimal (<50cc) Depth of Debridement: subcutaneous tissue. Technique: Sharp excisional debridement to bleeding, viable wound base.  Wound Progress: This is my initial encounter for the wound.  I  will continue to monitor the progression of it through a follow-up visits. Site healing conversation 7 Dressing: Dry, sterile, compression dressing. Disposition: Patient tolerated procedure well. Patient to return in 1 week for follow-up.  No follow-ups on file.

## 2020-04-08 NOTE — Progress Notes (Signed)
Reviewed: I agree with Dr. Koval's documentation and management. 

## 2020-04-14 ENCOUNTER — Ambulatory Visit (INDEPENDENT_AMBULATORY_CARE_PROVIDER_SITE_OTHER): Payer: 59 | Admitting: Podiatry

## 2020-04-14 ENCOUNTER — Other Ambulatory Visit: Payer: Self-pay

## 2020-04-14 ENCOUNTER — Ambulatory Visit (INDEPENDENT_AMBULATORY_CARE_PROVIDER_SITE_OTHER): Payer: 59

## 2020-04-14 ENCOUNTER — Other Ambulatory Visit: Payer: Self-pay | Admitting: Podiatry

## 2020-04-14 ENCOUNTER — Ambulatory Visit: Payer: 59 | Admitting: Family Medicine

## 2020-04-14 DIAGNOSIS — S93401A Sprain of unspecified ligament of right ankle, initial encounter: Secondary | ICD-10-CM

## 2020-04-14 DIAGNOSIS — M7752 Other enthesopathy of left foot: Secondary | ICD-10-CM

## 2020-04-14 DIAGNOSIS — IMO0001 Reserved for inherently not codable concepts without codable children: Secondary | ICD-10-CM

## 2020-04-14 DIAGNOSIS — M7750 Other enthesopathy of unspecified foot: Secondary | ICD-10-CM

## 2020-04-14 DIAGNOSIS — M79672 Pain in left foot: Secondary | ICD-10-CM | POA: Diagnosis not present

## 2020-04-14 DIAGNOSIS — M79671 Pain in right foot: Secondary | ICD-10-CM

## 2020-04-17 ENCOUNTER — Encounter: Payer: Self-pay | Admitting: Podiatry

## 2020-04-17 NOTE — Progress Notes (Signed)
Subjective:  Patient ID: [name], male    DOB: [birth year],  MRN: [id number]  Chief Complaint  Patient presents with   Callouses    Left sub 1st callous trim   Nail Problem    Nail trim 1-5 bilateral   Ankle Pain    Possible ankle injury, "Stepped in a hole may have twisted my ankle" swelling around ankle.   Ankle Pain    Bilateral ankle swelling   Leg Problem    Left lower extremity lateral aspect swollen spot    54 y.o. male presents with the above complaint.  Patient presents with a complaint of right ankle injury that he twisted last Saturday.  Patient states that he was little bit painful when ambulating on it.  Patient states he stepped in a hole and twisted the ankle.  There is some swelling around it.  He states that he does not recall how the injury happened or the mechanism of injury.  He did not go to the emergency room.  He states that he has very mild pain.  He denies any other acute complaints.  He also states that he has thickened elongated dystrophic toenails x10 that he would like to have them debrided down.  He denies any other acute complaints.   Review of Systems: Negative except as noted in the HPI. Denies N/V/F/Ch.  Past Medical History:  Diagnosis Date   Allergy    Diabetes mellitus    Type 2 DM, Patient reports DM Type 1 diagnosd age at age 53   Diabetic foot ulcers ([place])    Foreign body in left foot    with  infection   Hypertension    Meniscus tear    Multiple rib fractures    Personal history of diabetic foot ulcer, Left Foot     Current Outpatient Medications:    amLODipine (NORVASC) 10 MG tablet, Take 1 tablet (10 mg total) by mouth at bedtime., Disp: 90 tablet, Rfl: 3   Blood Glucose Monitoring Suppl (BLOOD GLUCOSE MONITOR SYSTEM) w/Device KIT, 1 kit by Does not apply route daily., Disp: 1 kit, Rfl: 0   gabapentin (NEURONTIN) 100 MG capsule, Take 1 capsule (100 mg total) by mouth 3 (three) times daily., Disp: 90 capsule, Rfl:  3   glipiZIDE (GLUCOTROL XL) 5 MG 24 hr tablet, Take 1 tablet (5 mg total) by mouth daily., Disp: 90 tablet, Rfl: 3   labetalol (NORMODYNE) 100 MG tablet, Take 1 tablet (100 mg total) by mouth 2 (two) times daily., Disp: 60 tablet, Rfl: 1  Social History   Tobacco Use  Smoking Status Former Smoker   Types: Cigarettes   Quit date: 09/03/2012   Years since quitting: 7.6  Smokeless Tobacco Former Systems developer   Quit date: 09/03/2012    Allergies  Allergen Reactions   Latex Rash    Pt states he is allergic to latex condoms   Ace Inhibitors Cough   Objective:  There were no vitals filed for this visit. There is no height or weight on file to calculate BMI. Constitutional Well developed. Well nourished.  Vascular Dorsalis pedis pulses palpable bilaterally. Posterior tibial pulses palpable bilaterally. Capillary refill normal to all digits.  No cyanosis or clubbing noted. Pedal hair growth normal.  Neurologic Normal speech. Oriented to person, place, and time. Epicritic sensation to light touch grossly present bilaterally.  Dermatologic Nails well groomed and normal in appearance. No open wounds. No skin lesions.  Orthopedic:  Mild pain on palpation to the ATFL ligament.  Mild pain with plantarflexion and inversion of the foot.  1+ pitting edema noted circumferentially around the ankle joint.  No intra-articular ankle pain noted.  No pain at the posterior tibial tendon, peroneal tendon, Achilles tendon.   Radiographs: 3 views of skeletally mature adult right ankle: Good osseous alignment ending integrity noted of the ankle joint.  No fractures noted.  No arthritic changes noted. Assessment:   1. Pain in left foot   2. Pain in right foot   3. Grade 1 ankle sprain, right, initial encounter    Plan:  Patient was evaluated and treated and all questions answered.  Right ankle sprain mild -Clinically and radiographically patient does not have an anterior drawer sign or talar tilt  as well as any underlying fractures.  Patient has very mild pain if any.  At this point I would just address localized swelling with an Ace bandage.  If the pain worsens or gets progressively worse I have asked the patient to come back and see me at that point we can discuss immobilization. -Ace bandage was dispensed for compression.  No follow-ups on file.

## 2020-04-19 ENCOUNTER — Other Ambulatory Visit: Payer: Self-pay | Admitting: Podiatry

## 2020-04-19 DIAGNOSIS — S93401A Sprain of unspecified ligament of right ankle, initial encounter: Secondary | ICD-10-CM

## 2020-06-16 ENCOUNTER — Ambulatory Visit: Payer: 59 | Admitting: Podiatry

## 2020-06-21 ENCOUNTER — Ambulatory Visit: Payer: 59 | Admitting: Podiatry

## 2020-07-20 ENCOUNTER — Telehealth: Payer: Self-pay

## 2020-07-20 NOTE — Telephone Encounter (Signed)
Patient calls nurse line regarding low blood sugar throughout the night. Patient reports that blood sugar levels have been dropping down to 30s while asleep. He also has had tongue and fingertip numbness and unsteady gait, when blood sugar drops.   Patient is currently taking glipizide, once nightly. Patient states that he will drink juice during episodes, which brings blood sugar back up to baseline.   Discussed patient with Dr. Gwendlyn Deutscher. Advised that patient hold glipizide until he can be evaluated by provider. Patient is scheduled for first available appointment on Tuesday afternoon. Patient instructed to check blood sugar at least two times daily or if symptomatic. Patient will keep detailed log of blood sugars and symptoms. Strict ED precautions given.   Will call patient if any clinic openings occur between now scheduled appointment.   To PCP  Talbot Grumbling, RN

## 2020-07-21 ENCOUNTER — Ambulatory Visit (INDEPENDENT_AMBULATORY_CARE_PROVIDER_SITE_OTHER): Payer: 59 | Admitting: Podiatry

## 2020-07-21 ENCOUNTER — Other Ambulatory Visit: Payer: Self-pay

## 2020-07-21 ENCOUNTER — Encounter: Payer: Self-pay | Admitting: Podiatry

## 2020-07-21 DIAGNOSIS — M792 Neuralgia and neuritis, unspecified: Secondary | ICD-10-CM

## 2020-07-21 DIAGNOSIS — E1165 Type 2 diabetes mellitus with hyperglycemia: Secondary | ICD-10-CM

## 2020-07-21 MED ORDER — PREGABALIN 75 MG PO CAPS: 75.0000 mg | ORAL_CAPSULE | Freq: Two times a day (BID) | ORAL | 0 refills | Status: DC

## 2020-07-21 NOTE — Telephone Encounter (Signed)
Called patient to check in. Patient reports that at 11 pm last night, CBG was 114, BP 201/101, HR 85. Patient reports taking amlodipine and gabapentin before bed. Patient did not take metformin as instructed. Morning CBG 86, BP 190/101, HR 72. Patient does report intermittent headaches. Denies CP, visual changes or current headache. Attempted to schedule for opening this afternoon, however, patient has appointment at another office and would not be available to come into office at only opening. Will continue to check for openings in the schedule for patient to be seen in clinic sooner.   Strict ED/ UC precautions given.   To PCP  Talbot Grumbling, RN

## 2020-07-25 ENCOUNTER — Encounter: Payer: Self-pay | Admitting: Podiatry

## 2020-07-25 ENCOUNTER — Encounter: Payer: Self-pay | Admitting: Family Medicine

## 2020-07-25 ENCOUNTER — Other Ambulatory Visit: Payer: Self-pay

## 2020-07-25 ENCOUNTER — Ambulatory Visit (INDEPENDENT_AMBULATORY_CARE_PROVIDER_SITE_OTHER): Payer: 59 | Admitting: Family Medicine

## 2020-07-25 VITALS — BP 180/88 | HR 82 | Ht 70.0 in | Wt 197.4 lb

## 2020-07-25 DIAGNOSIS — R3121 Asymptomatic microscopic hematuria: Secondary | ICD-10-CM

## 2020-07-25 DIAGNOSIS — E1159 Type 2 diabetes mellitus with other circulatory complications: Secondary | ICD-10-CM

## 2020-07-25 DIAGNOSIS — B354 Tinea corporis: Secondary | ICD-10-CM

## 2020-07-25 DIAGNOSIS — E1165 Type 2 diabetes mellitus with hyperglycemia: Secondary | ICD-10-CM | POA: Diagnosis not present

## 2020-07-25 DIAGNOSIS — F430 Acute stress reaction: Secondary | ICD-10-CM

## 2020-07-25 DIAGNOSIS — I152 Hypertension secondary to endocrine disorders: Secondary | ICD-10-CM

## 2020-07-25 DIAGNOSIS — N184 Chronic kidney disease, stage 4 (severe): Secondary | ICD-10-CM

## 2020-07-25 LAB — POCT UA - MICROSCOPIC ONLY

## 2020-07-25 LAB — POCT URINALYSIS DIP (MANUAL ENTRY)
Bilirubin, UA: NEGATIVE
Glucose, UA: 100 mg/dL — AB
Ketones, POC UA: NEGATIVE mg/dL
Leukocytes, UA: NEGATIVE
Nitrite, UA: NEGATIVE
Protein Ur, POC: NEGATIVE mg/dL
Spec Grav, UA: 1.02 (ref 1.010–1.025)
Urobilinogen, UA: 0.2 E.U./dL
pH, UA: 5.5 (ref 5.0–8.0)

## 2020-07-25 LAB — POCT GLYCOSYLATED HEMOGLOBIN (HGB A1C): HbA1c, POC (controlled diabetic range): 5.2 % (ref 0.0–7.0)

## 2020-07-25 MED ORDER — TERBINAFINE HCL 1 % EX CREA: 1.0000 "application " | TOPICAL_CREAM | Freq: Two times a day (BID) | CUTANEOUS | 0 refills | Status: DC

## 2020-07-25 MED ORDER — METOPROLOL SUCCINATE ER 25 MG PO TB24: 25.0000 mg | ORAL_TABLET | Freq: Every day | ORAL | 0 refills | Status: DC

## 2020-07-25 MED ORDER — AMLODIPINE BESYLATE 10 MG PO TABS: 10.0000 mg | ORAL_TABLET | Freq: Every day | ORAL | 3 refills | Status: DC

## 2020-07-25 NOTE — Assessment & Plan Note (Signed)
Persists on microscopic exam today, will discuss with plan to refer to Urology when called with results.

## 2020-07-25 NOTE — Assessment & Plan Note (Signed)
Discussed that his kidneys are worsening in their function related to his hypertension.  We discussed the progression of this at length.  He is incredibly fearful of dialysis and progression of his kidney disease in general.  He does not wish to go to the nephrologist because he does not want more medications he reports.   Provided supportive listening we discussed that in the context of preventing disease rather than causing disease.  At the end of this discussion he is agreeable to calling [facility] today.  Given the number to call.  I am concerned that if the blood pressure continues to be this elevated or his creatinine is markedly worsened he will need to be admitted to start hemodialysis.  If we cannot get his blood pressure under better control or his kidney function has progressed significantly we will need to discuss in a more urgent fashion.

## 2020-07-25 NOTE — Progress Notes (Signed)
SUBJECTIVE:   CHIEF COMPLAINT / HPI:   [name] is a pleasant 54 year old gentleman with history of CKD and type 2 diabetes presenting today for follow-up.  He has a number of concerns.  Recent life changes Patient reports he is fearful of his BP and kidney function worsening. He recently lost his mother. Multiple family members have had to undergo hemodialysis. He reports anxiety related to his blood pressure and his family stressors. He at times becomes sad. Denies thoughts of hurting himself or others. Has 8 kids, 12 grandchildren. He is very close with his wife but cannot talk to her about medical concerns.   HTN Reports he is only taking labetolol at night. It makes him tired if taken during the day. Has never tired amlodipine. His BP has been 150-180 at home. Denies chest pain, dyspnea, headaches, vision changes. He reports normal urine output. He reports mild edema over the last year. No exertional dyspnea or PND.   Type 2 Diabetes Patient is taking glipizide 5 mg at night. Endorses episodes of hypoglycemia. No polyuria or polydipsia.     PERTINENT  PMH / PSH/Family/Social History :  Reviewed progression of creatinine Reviewed family history  Non smoker  Confirmed correct phone number    OBJECTIVE:   BP (!) 180/88   Pulse 82   Ht 5\' 10"  (1.778 m)   Wt 197 lb 6.4 oz (89.5 kg)   SpO2 99%   BMI 28.32 kg/m   Today's weight:  Last Weight  Most recent update: 07/25/2020  3:11 PM   Weight  89.5 kg (197 lb 6.4 oz)           Review of prior weights: Filed Weights   07/25/20 1508  Weight: 197 lb 6.4 oz (89.5 kg)   Neck: Supple Cardiac: Regular rate and rhythm. Normal S1/S2. No murmurs, rubs, or gallops appreciated. Lungs: Clear bilaterally to ascultation.  Abdomen: Normoactive bowel sounds. No tenderness to deep or light palpation. No rebound or guarding.  Extremities: Warm, well perfused with 1-2+ edema  Skin Back and abdomen + Striae with erythematous  macules and excoriations Excoriations on lower extremities with hyperpigemented areas On upper arms, lesions differ Areas of hypopigmentation with overlying annular erythematous patches, mild scaling    ASSESSMENT/PLAN:   Hypertension associated with diabetes ([place]) This is a fairly poorly controlled.  This is likely the primary etiology of his chronic kidney disease.  We discussed this at length today.  The patient does not tolerate his labetalol and is only actually taking it once a day.  Discussed side effects of beta-blockers.  Transition to metoprolol 25 mg once at night.  His heart rate is appropriate for this today.  The patient has tolerated amlodipine in the past.  Although he has lower extremity edema on exam today this would be the most beneficial medication from a stroke and cardiovascular standpoint.  I discussed this with him.  He is amenable to trialing this.  At follow-up pending his blood pressures would consider addition of Lasix for volume control as well as hydralazine.  Patient referred to nephrology.  Please see CKD 4 for additional documentation regarding this problem.  Creatinine today.  DM (diabetes mellitus), type 2, uncontrolled ([place]) A1c below goal at this time.  I suspect this is declining because of his declining glomerular filtration rate.  Given hyperglycemia we will stop glipizide.  Could consider linagliptin in the future.  Hematuria Persists on microscopic exam today, will discuss with plan to refer  to Urology when called with results.   CKD (chronic kidney disease) stage 4, GFR 15-29 ml/min (HCC) Discussed that his kidneys are worsening in their function related to his hypertension.  We discussed the progression of this at length.  He is incredibly fearful of dialysis and progression of his kidney disease in general.  He does not wish to go to the nephrologist because he does not want more medications he reports.   Provided supportive listening we discussed  that in the context of preventing disease rather than causing disease.  At the end of this discussion he is agreeable to calling [facility] today.  Given the number to call.  I am concerned that if the blood pressure continues to be this elevated or his creatinine is markedly worsened he will need to be admitted to start hemodialysis.  If we cannot get his blood pressure under better control or his kidney function has progressed significantly we will need to discuss in a more urgent fashion.  Discussed chronic care management to help aid in referrals and multiple medication changes.  Patient is amenable to this.  At follow-up strongly recommend obtaining renal ultrasound.  Hepatitis B serologies obtained today given the marked progression in his renal disease.  Renal function panel  in addition to CBC given his degree of chronic kidney disease  Acute stress reaction Provided supportive listening to patient.  He has suffered multiple changes in his life over the last year.  Provide a list of therapy resources.  Could consider low-dose of an SSRI in the future.  The patient is pursuing disability and asked me to write a letter that states that he has multiple medical conditions.  I will write such a letter.   Tinea corporis The nature of the annular rather raised rash with hypopigmented areas on the forearms differ significantly from the erythematous lesions located on his striae.  I am most suspicious for tinea although he has been putting a significant lotion on these areas and may have covered up the scale.  Differential also includes something like nummular eczema although the distribution for this is also atypical.  Will trial terbinafine as he is tried steroids at home and this is only worsened at.  Would strongly consider biopsy at follow-up.  This may also be related to his chronic kidney disease.  Hepatitis C RPR and HIV ordered.  Healthcare maintenance The patient reports he is already  received a Covid vaccine.  He would be eligible for the pneumonia vaccine  (PCV23) given his underlying diabetes.    Dorris Singh, [place]

## 2020-07-25 NOTE — Patient Instructions (Addendum)
It was wonderful to see you today.  Please bring ALL of your medications with you to every visit.   Today we talked about:  - STOPPING YOUR GLIPIZIDE and LABETALOL  - DO NOT TAKE GABAPENTIN and PREGABALIN (Lyrica) TOGETHER  - STOP LABETALOL  - START Metoprolol and Amlodipine  - Your job is to call Nephrology at [phone]  - you will be called by our nurse case manager     Thank you for choosing [facility].   Please call [phone] with any questions about today's appointment.    Dorris Singh, MD  Family Medicine    Therapy and Counseling Resources Most providers on this list will take Medicaid. Patients with commercial insurance or Medicare should contact their insurance company to get a list of in network providers.  [facility]:Psychiatry and Counseling [address] [facility] [address]. [place], [place] [address] [place]  [address] [place], [place] [address]      [place] [address]  [place], [place] [address] [phone]  [place] [address], [place]  [place], [place] [address]       [phone]      Jinny Blossom [facility] [address], [place], [place]  Family Solutions:  [place]. [place] [phone]  [facility]:  [place] [address] [phone]  [facility] (under & uninsured) [address], [place] [phone]    [email or web address]    [facility] [address] . [place]    [phone]  [facility] [place]     Phone:  [phone]     [place]  [place] [address]      [place], [place] ext [place]: [place], [place], [place]   [place] ([place] therapist) [email or web address]  [place] [address]   [place] [address]    [phone]    [facility]   [address],  [place], [place]   [place]  [place]  [facility]  [address] [place], [place]        [phone]  Open Access/Walk In Clinic under & uninsured  [facility]  [address] [place], [place] Crisis [phone]  [facility] of the [place],  ([place])   [place], [place]: [phone]) 8:30 - 12; 1 - 2:30  Family Service of the [place],  [place], [place]    ([phone]):8:30 - 12; 2 - 3PM  [facility] [employer],  [address]  [place]; [phone]):   Mon - Fri 8 AM - 5 PM  [facility] [place]  MWF 12:30 to 3:00 or call to schedule an appointment  [phone]  Specific Provider options Psychology Today  [email or web address] 1. click on find a therapist  2. enter your zip code 3. left side and select or tailor a therapist for your specific need.   [facility] Provider Directory [email or web address]  (Medicaid)   Follow all drop down to find a provider  Social Support  program [place]) 2815913837 or [email or web address] [address], [place], [place] Recovery support and educational   24- Hour Availability:  .  [name]  . [place], [place] Crisis [phone]  . Family Service of the [employer] [phone]  [facility] Crisis Service  [phone]   . [place]  [phone] (after hours)  . Therapeutic Alternative/Mobile Crisis   [phone]  . [place] National Suicide Hotline   478-803-8632 ([place])  . Call 911 or go to emergency room  . [employer]  [phone]);  [place] and [employer]   . [facility] ACCESS  [phone]); [place], [place], [place], [place], [place], [place], [place]

## 2020-07-25 NOTE — Assessment & Plan Note (Signed)
A1c below goal at this time.  I suspect this is declining because of his declining glomerular filtration rate.  Given hyperglycemia we will stop glipizide.  Could consider linagliptin in the future.

## 2020-07-25 NOTE — Assessment & Plan Note (Signed)
Provided supportive listening to patient.  He has suffered multiple changes in his life over the last year.  Provide a list of therapy resources.  Could consider low-dose of an SSRI in the future.

## 2020-07-25 NOTE — Assessment & Plan Note (Signed)
This is a fairly poorly controlled.  This is likely the primary etiology of his chronic kidney disease.  We discussed this at length today.  The patient does not tolerate his labetalol and is only actually taking it once a day.  Discussed side effects of beta-blockers.  Transition to metoprolol 25 mg once at night.  His heart rate is appropriate for this today.  The patient has tolerated amlodipine in the past.  Although he has lower extremity edema on exam today this would be the most beneficial medication from a stroke and cardiovascular standpoint.  I discussed this with him.  He is amenable to trialing this.  At follow-up pending his blood pressures would consider addition of Lasix for volume control as well as hydralazine.  Patient referred to nephrology.  Please see CKD 4 for additional documentation regarding this problem.  Creatinine today.

## 2020-07-25 NOTE — Progress Notes (Signed)
Subjective:  Patient ID: [name], male    DOB: [birth year],  MRN: [id number]  Chief Complaint  Patient presents with  . Callouses    Pt stated that he is doing okay feels like he feels like he has needles in his foot     54 y.o. male presents with the above complaint.  Patient presents with follow-up of right ankle injury.  Patient states is doing a lot better from that.  He has secondary complaint of neuropathic pain that has been going on since he had a diabetes.  Now his sugars are controlled but back when his neuropathic pain was worse his sugars are not controlled.  He states that he feels like walking on foot that are numb is constantly like pins-and-needles on both of his feet.  He states he is taking gabapentin which has not helped.  He would like to discuss alternative medications as well.   Review of Systems: Negative except as noted in the HPI. Denies N/V/F/Ch.  Past Medical History:  Diagnosis Date  . Allergy   . Diabetes mellitus    Type 2 DM, Patient reports DM Type 1 diagnosd age at age 81  . Diabetic foot ulcers ([place])   . Foreign body in left foot    with  infection  . Hypertension   . Meniscus tear   . Multiple rib fractures   . Personal history of diabetic foot ulcer, Left Foot     Current Outpatient Medications:  .  amLODipine (NORVASC) 10 MG tablet, Take 1 tablet (10 mg total) by mouth at bedtime., Disp: 90 tablet, Rfl: 3 .  Blood Glucose Monitoring Suppl (BLOOD GLUCOSE MONITOR SYSTEM) w/Device KIT, 1 kit by Does not apply route daily., Disp: 1 kit, Rfl: 0 .  gabapentin (NEURONTIN) 100 MG capsule, Take 1 capsule (100 mg total) by mouth 3 (three) times daily., Disp: 90 capsule, Rfl: 3 .  glipiZIDE (GLUCOTROL XL) 5 MG 24 hr tablet, Take 1 tablet (5 mg total) by mouth daily., Disp: 90 tablet, Rfl: 3 .  labetalol (NORMODYNE) 100 MG tablet, Take 1 tablet (100 mg total) by mouth 2 (two) times daily., Disp: 60 tablet, Rfl: 1 .  pregabalin (LYRICA) 75 MG capsule,  Take 1 capsule (75 mg total) by mouth 2 (two) times daily., Disp: 60 capsule, Rfl: 0  Social History   Tobacco Use  Smoking Status Former Smoker  . Types: Cigarettes  . Quit date: 09/03/2012  . Years since quitting: 7.8  Smokeless Tobacco Former Systems developer  . Quit date: 09/03/2012    Allergies  Allergen Reactions  . Latex Rash    Pt states he is allergic to latex condoms  . Ace Inhibitors Cough   Objective:  There were no vitals filed for this visit. There is no height or weight on file to calculate BMI. Constitutional Well developed. Well nourished.  Vascular Dorsalis pedis pulses palpable bilaterally. Posterior tibial pulses palpable bilaterally. Capillary refill normal to all digits.  No cyanosis or clubbing noted. Pedal hair growth normal.  Neurologic Normal speech. Oriented to person, place, and time. Decreased sensation to light touch grossly present bilaterally.  Vibratory sensation diminished  Dermatologic Nails well groomed and normal in appearance. No open wounds. No skin lesions.  Orthopedic:  No pain on palpation to the ATFL ligament.  No pain with plantarflexion and inversion of the foot.  1+ pitting edema noted circumferentially around the ankle joint.  No intra-articular ankle pain noted.  No pain at the posterior tibial  tendon, peroneal tendon, Achilles tendon.   Radiographs: 3 views of skeletally mature adult right ankle: Good osseous alignment ending integrity noted of the ankle joint.  No fractures noted.  No arthritic changes noted. Assessment:   1. Neuropathic pain   2. Uncontrolled type 2 diabetes mellitus with hyperglycemia ([place])    Plan:  Patient was evaluated and treated and all questions answered.  Right ankle sprain mild -Clinically healed.  Patient is continuing Ace bandage for compression.  Neuropathic pain -I explained to patient the etiology of neuropathic pain especially in a setting of diabetes.  I discussed with the patient the importance  of glucose management for diabetes.  Ultimately discussed with the patient that he will benefit from glucose management/control.  Patient states understanding.  Gabapentin has not been controlling the neuropathic pain well.  I believe patient may benefit from Lyrica.  Lyrica 75 mg was sent to the pharmacy.  No follow-ups on file.

## 2020-07-26 ENCOUNTER — Telehealth: Payer: Self-pay | Admitting: *Deleted

## 2020-07-26 ENCOUNTER — Encounter (HOSPITAL_COMMUNITY): Payer: Self-pay | Admitting: Emergency Medicine

## 2020-07-26 ENCOUNTER — Telehealth: Payer: Self-pay | Admitting: Family Medicine

## 2020-07-26 ENCOUNTER — Inpatient Hospital Stay (HOSPITAL_COMMUNITY)
Admission: EM | Admit: 2020-07-26 | Discharge: 2020-07-29 | DRG: 305 | Disposition: A | Discharge status: Home / Self Care | Payer: 59 | Attending: Family Medicine | Admitting: Family Medicine

## 2020-07-26 ENCOUNTER — Emergency Department (HOSPITAL_COMMUNITY): Payer: 59

## 2020-07-26 DIAGNOSIS — R21 Rash and other nonspecific skin eruption: Secondary | ICD-10-CM | POA: Diagnosis present

## 2020-07-26 DIAGNOSIS — E1169 Type 2 diabetes mellitus with other specified complication: Secondary | ICD-10-CM | POA: Diagnosis not present

## 2020-07-26 DIAGNOSIS — Z79899 Other long term (current) drug therapy: Secondary | ICD-10-CM

## 2020-07-26 DIAGNOSIS — E1122 Type 2 diabetes mellitus with diabetic chronic kidney disease: Secondary | ICD-10-CM | POA: Diagnosis present

## 2020-07-26 DIAGNOSIS — D539 Nutritional anemia, unspecified: Secondary | ICD-10-CM | POA: Diagnosis present

## 2020-07-26 DIAGNOSIS — I161 Hypertensive emergency: Principal | ICD-10-CM | POA: Diagnosis present

## 2020-07-26 DIAGNOSIS — Z8249 Family history of ischemic heart disease and other diseases of the circulatory system: Secondary | ICD-10-CM

## 2020-07-26 DIAGNOSIS — E872 Acidosis: Secondary | ICD-10-CM | POA: Diagnosis present

## 2020-07-26 DIAGNOSIS — G629 Polyneuropathy, unspecified: Secondary | ICD-10-CM | POA: Diagnosis present

## 2020-07-26 DIAGNOSIS — E785 Hyperlipidemia, unspecified: Secondary | ICD-10-CM | POA: Diagnosis present

## 2020-07-26 DIAGNOSIS — Z8639 Personal history of other endocrine, nutritional and metabolic disease: Secondary | ICD-10-CM | POA: Diagnosis present

## 2020-07-26 DIAGNOSIS — E875 Hyperkalemia: Secondary | ICD-10-CM | POA: Diagnosis present

## 2020-07-26 DIAGNOSIS — R3129 Other microscopic hematuria: Secondary | ICD-10-CM | POA: Diagnosis present

## 2020-07-26 DIAGNOSIS — N184 Chronic kidney disease, stage 4 (severe): Secondary | ICD-10-CM | POA: Diagnosis present

## 2020-07-26 DIAGNOSIS — I10 Essential (primary) hypertension: Secondary | ICD-10-CM | POA: Diagnosis not present

## 2020-07-26 DIAGNOSIS — E11621 Type 2 diabetes mellitus with foot ulcer: Secondary | ICD-10-CM | POA: Diagnosis present

## 2020-07-26 DIAGNOSIS — E1165 Type 2 diabetes mellitus with hyperglycemia: Secondary | ICD-10-CM | POA: Diagnosis present

## 2020-07-26 DIAGNOSIS — Z888 Allergy status to other drugs, medicaments and biological substances status: Secondary | ICD-10-CM

## 2020-07-26 DIAGNOSIS — F17210 Nicotine dependence, cigarettes, uncomplicated: Secondary | ICD-10-CM | POA: Diagnosis present

## 2020-07-26 DIAGNOSIS — Z833 Family history of diabetes mellitus: Secondary | ICD-10-CM

## 2020-07-26 DIAGNOSIS — N179 Acute kidney failure, unspecified: Secondary | ICD-10-CM | POA: Diagnosis not present

## 2020-07-26 DIAGNOSIS — L97509 Non-pressure chronic ulcer of other part of unspecified foot with unspecified severity: Secondary | ICD-10-CM | POA: Diagnosis present

## 2020-07-26 DIAGNOSIS — N529 Male erectile dysfunction, unspecified: Secondary | ICD-10-CM | POA: Diagnosis present

## 2020-07-26 DIAGNOSIS — F14929 Cocaine use, unspecified with intoxication, unspecified: Secondary | ICD-10-CM | POA: Diagnosis present

## 2020-07-26 DIAGNOSIS — E781 Pure hyperglyceridemia: Secondary | ICD-10-CM | POA: Diagnosis present

## 2020-07-26 DIAGNOSIS — Z91040 Latex allergy status: Secondary | ICD-10-CM

## 2020-07-26 DIAGNOSIS — M898X9 Other specified disorders of bone, unspecified site: Secondary | ICD-10-CM | POA: Diagnosis present

## 2020-07-26 DIAGNOSIS — IMO0002 Reserved for concepts with insufficient information to code with codable children: Secondary | ICD-10-CM | POA: Diagnosis present

## 2020-07-26 DIAGNOSIS — N185 Chronic kidney disease, stage 5: Secondary | ICD-10-CM | POA: Diagnosis present

## 2020-07-26 DIAGNOSIS — Z83438 Family history of other disorder of lipoprotein metabolism and other lipidemia: Secondary | ICD-10-CM

## 2020-07-26 DIAGNOSIS — Z20822 Contact with and (suspected) exposure to covid-19: Secondary | ICD-10-CM | POA: Diagnosis present

## 2020-07-26 DIAGNOSIS — R8281 Pyuria: Secondary | ICD-10-CM | POA: Diagnosis present

## 2020-07-26 DIAGNOSIS — I129 Hypertensive chronic kidney disease with stage 1 through stage 4 chronic kidney disease, or unspecified chronic kidney disease: Secondary | ICD-10-CM | POA: Diagnosis present

## 2020-07-26 DIAGNOSIS — L299 Pruritus, unspecified: Secondary | ICD-10-CM | POA: Diagnosis present

## 2020-07-26 DIAGNOSIS — N189 Chronic kidney disease, unspecified: Secondary | ICD-10-CM

## 2020-07-26 DIAGNOSIS — F430 Acute stress reaction: Secondary | ICD-10-CM | POA: Diagnosis present

## 2020-07-26 DIAGNOSIS — R809 Proteinuria, unspecified: Secondary | ICD-10-CM | POA: Diagnosis present

## 2020-07-26 LAB — CBC WITH DIFFERENTIAL/PLATELET
Abs Immature Granulocytes: 0.03 10*3/uL (ref 0.00–0.07)
Basophils Absolute: 0.1 10*3/uL (ref 0.0–0.2)
Basophils Absolute: 0.1 10*3/uL (ref 0.0–0.1)
Basophils Relative: 2 %
Basos: 1 %
EOS (ABSOLUTE): 0.7 10*3/uL — ABNORMAL HIGH (ref 0.0–0.4)
Eos: 7 %
Eosinophils Absolute: 0.7 10*3/uL — ABNORMAL HIGH (ref 0.0–0.5)
Eosinophils Relative: 8 %
HCT: 35.1 % — ABNORMAL LOW (ref 39.0–52.0)
Hematocrit: 30.4 % — ABNORMAL LOW (ref 37.5–51.0)
Hemoglobin: 10.4 g/dL — ABNORMAL LOW (ref 13.0–17.7)
Hemoglobin: 11.1 g/dL — ABNORMAL LOW (ref 13.0–17.0)
Immature Grans (Abs): 0 10*3/uL (ref 0.0–0.1)
Immature Granulocytes: 0 %
Immature Granulocytes: 0 %
Lymphocytes Absolute: 1.8 10*3/uL (ref 0.7–3.1)
Lymphocytes Relative: 14 %
Lymphs Abs: 1.3 10*3/uL (ref 0.7–4.0)
Lymphs: 19 %
MCH: 32.8 pg (ref 26.6–33.0)
MCH: 32.3 pg (ref 26.0–34.0)
MCHC: 34.2 g/dL (ref 31.5–35.7)
MCHC: 31.6 g/dL (ref 30.0–36.0)
MCV: 96 fL (ref 79–97)
MCV: 102 fL — ABNORMAL HIGH (ref 80.0–100.0)
Monocytes Absolute: 1 10*3/uL — ABNORMAL HIGH (ref 0.1–0.9)
Monocytes Absolute: 0.8 10*3/uL (ref 0.1–1.0)
Monocytes Relative: 9 %
Monocytes: 10 %
Neutro Abs: 6.4 10*3/uL (ref 1.7–7.7)
Neutrophils Absolute: 6.1 10*3/uL (ref 1.4–7.0)
Neutrophils Relative %: 67 %
Neutrophils: 63 %
Platelets: 196 10*3/uL (ref 150–450)
Platelets: 208 10*3/uL (ref 150–400)
RBC: 3.17 x10E6/uL — ABNORMAL LOW (ref 4.14–5.80)
RBC: 3.44 MIL/uL — ABNORMAL LOW (ref 4.22–5.81)
RDW: 12.9 % (ref 11.6–15.4)
RDW: 13.8 % (ref 11.5–15.5)
WBC: 9.8 10*3/uL (ref 3.4–10.8)
WBC: 9.5 10*3/uL (ref 4.0–10.5)
nRBC: 0 % (ref 0.0–0.2)

## 2020-07-26 LAB — URINALYSIS, ROUTINE W REFLEX MICROSCOPIC
Bilirubin Urine: NEGATIVE
Glucose, UA: 50 mg/dL — AB
Ketones, ur: NEGATIVE mg/dL
Nitrite: NEGATIVE
Protein, ur: >=300 mg/dL — AB
Specific Gravity, Urine: 1.011 (ref 1.005–1.030)
WBC, UA: >50 WBC/hpf — ABNORMAL HIGH (ref 0–5)
pH: 5 (ref 5.0–8.0)

## 2020-07-26 LAB — COMPREHENSIVE METABOLIC PANEL
ALT: 17 U/L (ref 0–44)
AST: 18 U/L (ref 15–41)
Albumin: 3 g/dL — ABNORMAL LOW (ref 3.5–5.0)
Alkaline Phosphatase: 69 U/L (ref 38–126)
Anion gap: 10 (ref 5–15)
BUN: 60 mg/dL — ABNORMAL HIGH (ref 6–20)
CO2: 15 mmol/L — ABNORMAL LOW (ref 22–32)
Calcium: 7.9 mg/dL — ABNORMAL LOW (ref 8.9–10.3)
Chloride: 116 mmol/L — ABNORMAL HIGH (ref 98–111)
Creatinine, Ser: 7.06 mg/dL — ABNORMAL HIGH (ref 0.61–1.24)
GFR, Estimated: 8 mL/min — ABNORMAL LOW (ref >60)
Glucose, Bld: 90 mg/dL (ref 70–99)
Potassium: 5.7 mmol/L — ABNORMAL HIGH (ref 3.5–5.1)
Sodium: 141 mmol/L (ref 135–145)
Total Bilirubin: 0.4 mg/dL (ref 0.3–1.2)
Total Protein: 6.4 g/dL — ABNORMAL LOW (ref 6.5–8.1)

## 2020-07-26 LAB — CK: Total CK: 416 U/L — ABNORMAL HIGH (ref 49–397)

## 2020-07-26 LAB — RAPID HIV SCREEN (HIV 1/2 AB+AG)
HIV 1/2 Antibodies: NONREACTIVE
HIV-1 P24 Antigen - HIV24: NONREACTIVE

## 2020-07-26 LAB — RENAL FUNCTION PANEL
Albumin: 3.7 g/dL — ABNORMAL LOW (ref 3.8–4.9)
BUN/Creatinine Ratio: 9 (ref 9–20)
BUN: 63 mg/dL — ABNORMAL HIGH (ref 6–24)
CO2: 15 mmol/L — ABNORMAL LOW (ref 20–29)
Calcium: 7.9 mg/dL — ABNORMAL LOW (ref 8.7–10.2)
Chloride: 115 mmol/L — ABNORMAL HIGH (ref 96–106)
Creatinine, Ser: 6.77 mg/dL — ABNORMAL HIGH (ref 0.76–1.27)
GFR calc Af Amer: 10 mL/min/{1.73_m2} — ABNORMAL LOW (ref >59)
GFR calc non Af Amer: 9 mL/min/{1.73_m2} — ABNORMAL LOW (ref >59)
Glucose: 80 mg/dL (ref 65–99)
Phosphorus: 7.3 mg/dL — ABNORMAL HIGH (ref 2.8–4.1)
Potassium: 5.4 mmol/L — ABNORMAL HIGH (ref 3.5–5.2)
Sodium: 143 mmol/L (ref 134–144)

## 2020-07-26 LAB — HEPATITIS C ANTIBODY: HCV Ab: NONREACTIVE

## 2020-07-26 LAB — RESPIRATORY PANEL BY RT PCR (FLU A&B, COVID)
Influenza A by PCR: NEGATIVE
Influenza B by PCR: NEGATIVE
SARS Coronavirus 2 by RT PCR: NEGATIVE

## 2020-07-26 LAB — HEPATITIS B CORE ANTIBODY, TOTAL
Hep B Core Total Ab: NEGATIVE
Hep B Core Total Ab: NONREACTIVE

## 2020-07-26 LAB — HEPATITIS B SURFACE ANTIGEN
Hepatitis B Surface Ag: NEGATIVE
Hepatitis B Surface Ag: NONREACTIVE

## 2020-07-26 LAB — PROTEIN / CREATININE RATIO, URINE
Creatinine, Urine: 51.89 mg/dL
Protein Creatinine Ratio: 13.12 mg/mg{Cre} — ABNORMAL HIGH (ref 0.00–0.15)
Total Protein, Urine: 681 mg/dL

## 2020-07-26 LAB — MICROALBUMIN / CREATININE URINE RATIO
Creatinine, Urine: 52 mg/dL
Microalb/Creat Ratio: 7426 mg/g creat — ABNORMAL HIGH (ref 0–29)
Microalbumin, Urine: 3861.5 ug/mL

## 2020-07-26 LAB — HIV ANTIBODY (ROUTINE TESTING W REFLEX): HIV Screen 4th Generation wRfx: NONREACTIVE

## 2020-07-26 LAB — RPR: RPR Ser Ql: NONREACTIVE

## 2020-07-26 LAB — HEPATITIS B SURFACE ANTIBODY, QUANTITATIVE: Hepatitis B Surf Ab Quant: <3.1 m[IU]/mL — ABNORMAL LOW (ref >9.9)

## 2020-07-26 LAB — HCV INTERPRETATION

## 2020-07-26 LAB — AMMONIA: Ammonia: 20 umol/L (ref 9–35)

## 2020-07-26 LAB — HCV AB W REFLEX TO QUANT PCR: HCV Ab: <0.1 s/co ratio (ref 0.0–0.9)

## 2020-07-26 MED ORDER — SODIUM BICARBONATE 650 MG PO TABS
1300.0000 mg | ORAL_TABLET | Freq: Three times a day (TID) | ORAL | Status: DC
  Administered 2020-07-26 – 2020-07-29 (×9): 1300 mg via ORAL
  Filled 2020-07-26 (×12): qty 2

## 2020-07-26 MED ORDER — LABETALOL HCL 5 MG/ML IV SOLN
20.0000 mg | Freq: Once | INTRAVENOUS | Status: AC
  Administered 2020-07-26: 20 mg via INTRAVENOUS
  Filled 2020-07-26: qty 4

## 2020-07-26 MED ORDER — SODIUM ZIRCONIUM CYCLOSILICATE 10 G PO PACK
10.0000 g | PACK | Freq: Three times a day (TID) | ORAL | Status: DC
  Administered 2020-07-26 – 2020-07-28 (×6): 10 g via ORAL
  Filled 2020-07-26 (×6): qty 1

## 2020-07-26 MED ORDER — SODIUM CHLORIDE 0.9 % IV BOLUS
1000.0000 mL | Freq: Once | INTRAVENOUS | Status: AC
  Administered 2020-07-26: 1000 mL via INTRAVENOUS

## 2020-07-26 MED ORDER — ACETAMINOPHEN 325 MG PO TABS
650.0000 mg | ORAL_TABLET | Freq: Once | ORAL | Status: AC
  Administered 2020-07-26: 650 mg via ORAL
  Filled 2020-07-26: qty 2

## 2020-07-26 MED ORDER — METOPROLOL TARTRATE 5 MG/5ML IV SOLN
5.0000 mg | Freq: Once | INTRAVENOUS | Status: AC
  Administered 2020-07-26: 5 mg via INTRAVENOUS
  Filled 2020-07-26: qty 5

## 2020-07-26 MED ORDER — HYDRALAZINE HCL 10 MG PO TABS
10.0000 mg | ORAL_TABLET | Freq: Once | ORAL | Status: AC
  Administered 2020-07-26: 10 mg via ORAL
  Filled 2020-07-26: qty 1

## 2020-07-26 NOTE — ED Triage Notes (Addendum)
Pt states he went to pcp yesterday for htn, had labs drawn, was called today and told his K+ was 6.9 and needed to come in for further evaluation. Denies renal problems. A/ox4, resp e/u, nad.

## 2020-07-26 NOTE — ED Provider Notes (Signed)
[facility] EMERGENCY DEPARTMENT Provider Note   CSN: [id number] Arrival date & time: 07/26/20  1419     History Chief Complaint  Patient presents with  . Abnormal Lab    Kyson Kupper is a 54 y.o. male.  Patient's PCP obtained lab work during an routine office visit yesterday. Patient noted to have renal insufficiency with BUN 63, Creatinine 6.77, K 5.4. Patient with history of hypertension and diabetes. He has noticed swelling in his legs for the last several weeks. Pruritic rash on arms. Normal urine output. Denies chest pain, shortness of breath, muscle weakness.   The history is provided by the patient and medical records.  Abnormal Lab Time since result:  24 hours Patient referred by:  PCP Result type: chemistry   Chemistry:    Potassium:  High   BUN:  High   Creatinine:  High      Past Medical History:  Diagnosis Date  . Allergy   . CKD (chronic kidney disease), stage IV ([place])   . Diabetes mellitus    Type 2 DM, Patient reports DM Type 1 diagnosd age at age 65  . Diabetic foot ulcers ([place])   . Foreign body in left foot    with  infection  . Hypertension   . Meniscus tear   . Multiple rib fractures   . Personal history of diabetic foot ulcer, Left Foot     Patient Active Problem List   Diagnosis Date Noted  . Acute stress reaction 07/25/2020  . CKD (chronic kidney disease) stage 4, GFR 15-29 ml/min (HCC) 03/07/2020  . Hematuria 03/07/2020  . Foot callus 07/01/2018  . Hyperlipidemia 05/28/2016  . Hypertriglyceridemia 05/28/2016  . Adjustment disorder with anxiety 12/09/2014  . Erectile dysfunction 10/21/2012  . Hypertension associated with diabetes ([place]) 04/14/2012  . DM (diabetes mellitus), type 2, uncontrolled ([place]) 04/13/2012    Past Surgical History:  Procedure Laterality Date  . EYE SURGERY    . I & D EXTREMITY  04/23/2012   Procedure: IRRIGATION AND DEBRIDEMENT EXTREMITY;  Surgeon: Newt Minion, MD;  Location: [facility];   Service: Orthopedics;  Laterality: Left;  Irrigation and Debridement Left Foot  . I & D EXTREMITY  09/18/2012   Procedure: IRRIGATION AND DEBRIDEMENT EXTREMITY;  Surgeon: Newt Minion, MD;  Location: [facility];  Service: Orthopedics;  Laterality: Left;  . MENISCUS REPAIR Left 04/2016   Orthopedist, Dr [name] in [place], [place]       Family History  Problem Relation Age of Onset  . Diabetes type II Mother   . Arthritis Mother   . Diabetes Mother   . Hyperlipidemia Mother   . Hypertension Mother   . Diabetes type II Sister   . Diabetes Father     Social History   Tobacco Use  . Smoking status: Former Smoker    Types: Cigarettes    Quit date: 09/03/2012    Years since quitting: 7.8  . Smokeless tobacco: Former Systems developer    Quit date: 09/03/2012  Substance Use Topics  . Alcohol use: Yes    Alcohol/week: 8.0 standard drinks    Types: 6 Cans of beer, 2 Shots of liquor per week    Comment: weekends.  . Drug use: No    Home Medications Prior to Admission medications   Medication Sig Start Date End Date Taking? Authorizing Provider  amLODipine (NORVASC) 10 MG tablet Take 1 tablet (10 mg total) by mouth at bedtime. 07/25/20   Martyn Malay, MD  Blood Glucose Monitoring Suppl (BLOOD GLUCOSE MONITOR SYSTEM) w/Device KIT 1 kit by Does not apply route daily. Patient not taking: Reported on 07/25/2020 02/22/20   Wilber Oliphant, MD  gabapentin (NEURONTIN) 100 MG capsule Take 1 capsule (100 mg total) by mouth 3 (three) times daily. 03/22/20   Felipa Furnace, DPM  metoprolol succinate (TOPROL-XL) 25 MG 24 hr tablet Take 1 tablet (25 mg total) by mouth at bedtime. Take with or immediately following a meal. 07/25/20   Martyn Malay, MD  pregabalin (LYRICA) 75 MG capsule Take 1 capsule (75 mg total) by mouth 2 (two) times daily. Patient not taking: Reported on 07/25/2020 07/21/20 08/20/20  Felipa Furnace, DPM  terbinafine (LAMISIL) 1 % cream Apply 1 application topically 2 (two) times daily. For 1  week to arms 07/25/20   Martyn Malay, MD    Allergies    Latex and Ace inhibitors  Review of Systems   Review of Systems  Constitutional: Negative for fatigue.  Respiratory: Negative for shortness of breath.   Cardiovascular: Positive for leg swelling. Negative for chest pain.  Genitourinary: Negative for difficulty urinating and dysuria.  Skin: Positive for rash.  All other systems reviewed and are negative.   Physical Exam Updated Vital Signs BP (!) 214/106 (BP Location: Right Arm)   Pulse 95   Temp 98.5 F (36.9 C) (Oral)   Resp 18   Ht 5' 11"  (1.803 m)   Wt 89.5 kg   SpO2 95%   BMI 27.52 kg/m   Physical Exam Vitals and nursing note reviewed.  Constitutional:      General: He is not in acute distress.    Appearance: Normal appearance.  HENT:     Head: Normocephalic.     Mouth/Throat:     Mouth: Mucous membranes are moist.  Eyes:     Conjunctiva/sclera: Conjunctivae normal.  Cardiovascular:     Rate and Rhythm: Normal rate and regular rhythm.  Pulmonary:     Effort: Pulmonary effort is normal.     Breath sounds: Normal breath sounds.  Abdominal:     Palpations: Abdomen is soft.  Musculoskeletal:        General: Swelling present.     Cervical back: Normal range of motion.  Skin:    General: Skin is warm and dry.     Findings: Rash present.  Neurological:     Mental Status: He is alert and oriented to person, place, and time.  Psychiatric:        Mood and Affect: Mood normal.        Behavior: Behavior normal.     ED Results / Procedures / Treatments   Labs (all labs ordered are listed, but only abnormal results are displayed) Labs Reviewed  COMPREHENSIVE METABOLIC PANEL - Abnormal; Notable for the following components:      Result Value   Potassium 5.7 (*)    Chloride 116 (*)    CO2 15 (*)    BUN 60 (*)    Creatinine, Ser 7.06 (*)    Calcium 7.9 (*)    Total Protein 6.4 (*)    Albumin 3.0 (*)    GFR, Estimated 8 (*)    All other  components within normal limits  CBC WITH DIFFERENTIAL/PLATELET - Abnormal; Notable for the following components:   RBC 3.44 (*)    Hemoglobin 11.1 (*)    HCT 35.1 (*)    MCV 102.0 (*)    Eosinophils Absolute 0.7 (*)  All other components within normal limits  CK - Abnormal; Notable for the following components:   Total CK 416 (*)    All other components within normal limits  RESPIRATORY PANEL BY RT PCR (FLU A&B, COVID)  URINALYSIS, ROUTINE W REFLEX MICROSCOPIC  PROTEIN / CREATININE RATIO, URINE  PROTEIN ELECTROPHORESIS, SERUM  HEPATITIS B CORE ANTIBODY, TOTAL  HEPATITIS B SURFACE ANTIGEN  HEPATITIS B SURFACE ANTIBODY, QUANTITATIVE  HEPATITIS C ANTIBODY  RAPID HIV SCREEN (HIV 1/2 AB+AG)  KAPPA/LAMBDA LIGHT CHAINS  RENAL FUNCTION PANEL  CBC  FERRITIN  IRON AND TIBC  PTH, INTACT AND CALCIUM  VITAMIN B12  FOLATE    EKG None  Radiology No results found.  Procedures Procedures (including critical care time)  Medications Ordered in ED Medications  sodium chloride 0.9 % bolus 1,000 mL (has no administration in time range)    ED Course  I have reviewed the triage vital signs and the nursing notes.  Pertinent labs & imaging results that were available during my care of the patient were reviewed by me and considered in my medical decision making (see chart for details). 1618: Nephrology consulted, will see patient.  BP (!) 193/98 (BP Location: Left Arm)   Pulse 95   Temp 98.5 F (36.9 C) (Oral)   Resp 18   Ht 5' 11"  (1.803 m)   Wt 89.5 kg   SpO2 95%   BMI 27.52 kg/m    MDM Rules/Calculators/A&P                         Patient with worsening CKD over the last 5 months. Patient did not follow-up with nephrology as requested in May of this year.   Nephrology has seen patient in the ED/ Blood pressure improved after metoprolol, hydralazine and lopressor.  Multiple conversations with family medicine admission team. They have requested that I consult with CCM for  possible ICU admission due to uncontrolled hypertension in the setting of CKD and hyperkalemia.   Discussed patient with Dr. Corinna Lines with CCM. Patient does not currently meet criteria for ICU admission.   Family medicine contacted. They will be in to see patient for admission. Final Clinical Impression(s) / ED Diagnoses Final diagnoses:  Acute renal failure superimposed on chronic kidney disease, unspecified CKD stage, unspecified acute renal failure type [facility])    Rx / DC Orders ED Discharge Orders    None       Etta Quill, NP 07/26/20 2215    Fredia Sorrow, MD 07/27/20 [phone]

## 2020-07-26 NOTE — Consult Note (Signed)
Nephrology Consult [facility]   Requesting provider: Fredia Sorrow, MD  Assessment/Recommendations: [name] is a 54 y.o. male with a past medical history of long standing DM with complications, uncontrolled HTN, nonadherence presents with worsening kidney function, hyperkalemia.    AKI on CKD4: suspecting this secondary to progression of disease versus HTN emergency. 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  Also, his blood pressure is a limiting factor to obtaining a biopsy -renal US -ua w/ sediment exam -check CK levels -Continue to monitor daily Cr, Dose meds for GFR<15 -Monitor Daily I/Os, Daily weight  -Maintain MAP>65 for optimal renal perfusion.  -Agree with holding ACE-I, avoid further nephrotoxins including NSAIDS, Morphine.  Unless absolutely necessary, avoid CT with contrast and/or MRI with gadolinium.     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 May very well require diuretics as well. Consider  echo -may need to rule out renal artery stenosis  CKD-MBD: check phos, pth  Microscopic Hematuria -will repeat UA and will check ANCA+ANA serologies. If still present with no concerns of vasculitis then can likely follow up with GU as an outpatient  Macrocytic anemia: -Transfuse for Hgb<7 g/dL -please check b12, folate, and iron panel  Uncontrolled Diabetes Mellitus Type 2 :mgmt per primary service   Recommendations conveyed to primary service.    [facility] 07/26/2020 4:22 PM   _____________________________________________________________________________________   History of Present Illness: Earl Gauseis a 54y.o. male with a past medical history of diabetes mellitus type 2 (diagnosed in his 25s, diabetic retinopathy, hypertension, CKD, hyperlipidemia, medication nonadherence [facility]  He was found to have hyperkalemia and worsening creatinine level.  It was around 3.7 earlier this year and went up to 6.8 yesterday with a potassium of 5.4.  He has been aware of his kidney disease over the course of the last year but put off going to a nephrologist given some personal issues that he was dealing with (for example with his mother unfortunately passed away).  He is very well aware of what dialysis entails as he has multiple family members on dialysis due to diabetes.  He does take NSAIDs and averages around 1 to 2 pills every 2 weeks. He does report that he misses his evening dose of medications frequently due to the fact that his medications make him feel fatigued. He does report some worsening swelling of his legs over the last few months. Denies any fevers, chest pain, shortness of breath, orthopnea, change in urinary frequency, gross hematuria, dysuria, flank pain, dizziness, changes in vision,  headache, dysgeusia, loss of appetite, tremors, episodes of confusion/brain fog, change in sleep patterns, worsening  pruritus, hiccups.   Medications:  Current Facility-Administered Medications  Medication Dose Route Frequency Provider Last Rate Last Admin   sodium chloride 0.9 % bolus 1,000 mL  1,000 mL Intravenous Once Etta Quill, NP       Current Outpatient Medications  Medication Sig Dispense Refill   amLODipine (NORVASC) 10 MG tablet Take 1 tablet (10 mg total) by mouth at bedtime. 90 tablet 3   Blood Glucose Monitoring Suppl (BLOOD GLUCOSE MONITOR SYSTEM) w/Device KIT 1 kit by Does not apply route daily. (Patient not taking: Reported on 07/25/2020) 1 kit 0   gabapentin (NEURONTIN) 100 MG capsule Take 1 capsule (100 mg total) by mouth 3 (three) times daily. 90 capsule 3   metoprolol succinate (TOPROL-XL) 25 MG 24 hr tablet Take 1 tablet (25 mg total) by mouth at bedtime. Take with or immediately following a meal. 90 tablet 0   pregabalin (LYRICA) 75 MG capsule Take 1 capsule (75 mg total) by mouth 2 (two) times daily. (Patient not taking: Reported on 07/25/2020) 60 capsule 0   terbinafine (LAMISIL) 1 % cream Apply 1 application topically 2 (two) times daily. For 1 week to arms 30 g 0     ALLERGIES Latex and Ace inhibitors  MEDICAL HISTORY Past Medical History:  Diagnosis Date   Allergy    CKD (chronic kidney disease), stage IV (HCC)    Diabetes mellitus    Type 2 DM, Patient reports DM Type 1 diagnosd age at age 11   Diabetic foot ulcers ([place])    Foreign body in left foot    with  infection   Hypertension    Meniscus tear    Multiple rib fractures    Personal history of diabetic foot ulcer, Left Foot      SOCIAL HISTORY Social History   Socioeconomic History   Marital status: Married    Spouse name: Not on file   Number of children: Not on file   Years of education: Not on file   Highest education level: Not on file  Occupational History   Not on file  Tobacco Use   Smoking status: Former Smoker    Types: Cigarettes    Quit date: 09/03/2012    Years  since quitting: 7.8   Smokeless tobacco: Former Systems developer    Quit date: 09/03/2012  Substance and Sexual Activity   Alcohol use: Yes    Alcohol/week: 8.0 standard drinks    Types: 6 Cans of beer, 2 Shots of liquor per week    Comment: weekends.   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    Social Determinants of Health   Financial Resource Strain:    Difficulty of Paying Living Expenses: Not on file  Food Insecurity:    Worried About Charity fundraiser in the Last Year: Not on file   [employer] of Food in the Last Year: Not on file  Transportation Needs:    Lack of Transportation (Medical): Not on file   Lack of Transportation (Non-Medical): Not on file  Physical Activity:    Days of Exercise per Week: Not on file   Minutes of Exercise per Session: Not on file  Stress:    Feeling of Stress : Not on file  Social Connections:    Frequency of Communication with Friends and Family: Not on file   Frequency of Social Gatherings with Friends and Family: Not on file   Attends Religious Services: Not on Electrical engineer or Organizations: Not on file   Attends Archivist Meetings: Not on file   Marital Status: Not on file  Intimate Partner Violence:    Fear of Current or Ex-Partner: Not on file   Emotionally Abused: Not on file   Physically Abused: Not on file   Sexually Abused: Not on file     FAMILY HISTORY Family History  Problem Relation Age of Onset   Diabetes type II Mother    Arthritis Mother    Diabetes Mother    Hyperlipidemia Mother    Hypertension Mother    Diabetes type II Sister    Diabetes Father     Numerous family members on dialysis due to 'diabetes'  Review of Systems: 12 systems reviewed Otherwise as per HPI, all other systems reviewed and negative  Physical Exam: Vitals:    07/26/20 1437  BP: (!) 214/106  Pulse: 95  Resp: 18  Temp: 98.5 F (36.9 C)  SpO2: 95%   No intake/output data recorded. No intake or output data in the 24 hours ending 07/26/20 1622 General: well-appearing, no acute distress HEENT: anicteric sclera, oropharynx clear without lesions CV: regular rate, normal rhythm, no murmurs, no gallops, no rubs Lungs: clear to auscultation bilaterally, normal work of breathing, no overt W/R/R/C, bilateral chest expansion Abd: soft, non-tender, non-distended Skin: no visible lesions or rashes Psych: alert, engaged, appropriate mood and affect Musculoskeletal: trace edema b/l LE's, no obvious deformities Neuro: normal speech, no gross focal deficits, no asterixis  Test Results Reviewed Lab Results  Component Value Date   NA 141 07/26/2020   K 5.7 (H) 07/26/2020   CL 116 (H) 07/26/2020   CO2 15 (L) 07/26/2020   BUN 60 (H) 07/26/2020   CREATININE 7.06 (H) 07/26/2020   CALCIUM 7.9 (L) 07/26/2020   ALBUMIN 3.0 (L) 07/26/2020   PHOS 7.3 (H) 07/25/2020     I have reviewed all relevant outside healthcare records related to the patient's kidney injury.

## 2020-07-26 NOTE — Chronic Care Management (AMB) (Signed)
  Care Salinas   Outreach Note  07/26/2020 Name: [name] MRN: [id number] DOB: [birth year]  [name] is a 54 y.o. year old male who is a primary care patient of [name], Earl Quale, MD. I reached out to [name] by phone today in response to a referral sent by [name]'s PCP, Earl Oliphant, MD.     An unsuccessful telephone outreach was attempted today. The patient was referred to the case Salinas team for assistance with care Salinas and care coordination.   Follow Up Plan: A HIPAA compliant phone message was left for the patient providing contact information and requesting a return call. The care Salinas team will reach out to the patient again over the next 7 days. If patient returns call to provider office, please advise to call [facility] at [phone].  [name]

## 2020-07-26 NOTE — Telephone Encounter (Signed)
Call patient with results.  He has hyperphosphatemia, mild hyperkalemia and marked elevation in creatinine.  His blood pressure was in the 200s over 100s on initial evaluation yesterday.  This improved only slightly on repeat.  He has been off medications for some time.  His only symptom yesterday was lower extremity edema.  Discussed my recommendation to proceed to the emergency room for evaluation for repeat blood pressure, likely admission to lower blood pressure in the setting of his marked elevation in creatinine over the past few months.  He is going to discuss with his family.  I reviewed the risks associated with worsening kidney disease including dialysis and progression of his hyperkalemia leading to arrhythmia.  All questions answered.  Patient plans to proceed to the ER.  Dorris Singh, MD  Family Medicine Teaching Service

## 2020-07-26 NOTE — Telephone Encounter (Signed)
Attempted to call again. No answer.  Dorris Singh, MD  Family Medicine Teaching Service

## 2020-07-26 NOTE — H&P (Addendum)
[name] Service Pager: [phone]  Patient name: [name] Medical record number: [id number] Date of birth: 1966-03-21 Age: 54 y.o. Gender: male  Primary Care Provider: Wilber Oliphant, MD Consultants: Nephrology Code Status: FULL Preferred Emergency Contact: Wife [name] [phone]   Chief Complaint: hypertensive emergency   Assessment and Plan: [name] is a 54 y.o. male presenting with hypertensive emergency. PMH is significant for hypertension, CKD, type 2 diabetes.  Hypertensive emergency  [name] is a 54 year old male presents today for severely elevated blood pressures with AKI on CKD stage IV and proteinuria.  He was seen at [facility] clinic today where systolic blood pressures were 536 and diastolic of 144.  There is only slight improvement on repeat blood pressures and was sent to the ED for evaluation.  Also had labs in the clinic resulting in hyperphosphatemia and mild hyperkalemia and marked elevation in creatinine.  In the ED labs: Na 141, K5.7, glucose 90, creatinine 7, BUN 60, calcium 7.9, albumin 3, CK 416, Hb 11.1 WBC 9.5, platelets 208.  EKG sinus rhythm. Initially BP 214/106. S/p metoprolol 27m, labetalol 231m hydralazine 1044mn the ED after which blood pressures have been 150315-400stolic.  Labs demonstrate AKI on CKD stage IV and proteinuria.  Low suspicion for neurologic damage (no evidence of encephalopathy or or other neurological symptoms) or cardiac damage (denies chest pain) and EKG without ischemic changes. Will admit for further blood pressure control. -Admit to FPTS, Dr McDiarmid, progressive floor -Renal diet with fluid restriction 1200 cc -Vitals per floor routine -Up with assistance -Q1H BPs -Goal BP of <180/<120 mmHg in the first first hour  -Goal BP 160/<110 mmHg for the next 23 hours  -Reduce MAP by 25% -Labetalol 10 mg IV every 2 hours as needed -Heparin for DVT prophylaxis -AM CBC,  renal function panel, PTH, calcium, vitamin B-12, folate, ferritin -Low threshold for CCM involvement  AKI on CKD stage IV Worsening disease likely secondary to diabetes and uncontrolled hypertension. Creatinine 7> 6.77 yesterday. BUN 60. -Nephrology following, appreciate recommendations  -May need to start dialysis while in hospital -Follow-up renal ultrasound -Avoid nephrotoxic agents i.e. ACE inhibitor's, NSAIDs, avoid CT with contrast or MRI with gadolinium -Dose all medications to GFR< 15 -Continue bicarb infusion 1300 mg 3 times daily -I's and O's -Renal function panel in a.m.  Proteinuria Elevated urine protein creatinine ratio-13 -Nephrology following, appreciate recommendations -Follow-up SPEP w/ IF, FLC, Hep B and C panel, HIV  Hyperkalemia K 5.7, likely secondary to worsening CKD and possible RTA4 from diabetes -Nephrology following, appreciate recommendations -Continue Lokelma 10 mg 3 times daily -Continue bicarb infusion 1300 mg 3 times daily  Metabolic acidosis, non-anion gap -Continue bicarb infusion per nephrology  Type 2 DM, diabetic foot ulcer CBG 120 on admission.  Last A1c 5.2 on 07/25/2020. Denies polyuria, polydipsia or blurred vision Home meds: Glipizide 5 mg, unsure of compliance -CBGs every 4 hourly -Sensitive sign scale if needed   HTN Pt has been monitoring Bps at home for the last month and they have been in the 200867Ystolic. Patient was prescribed amlodipine and metoprolol yesterday but has been unable to collect them. -Management as above -Close follow-up outpatient for hypertension  Anemia, macrocytic Hb 11.1, MCV 102.  Pt drinks 4 units hard liquor every week. -Transfusion threshold <7 -Monitor with CBC -Follow-up folate, B12 and iron panel  Tobacco substance disorder Smokes socially, threes cigarettes a month for the last 20 years -Offer  nicotine patch   Peripheral neuropathy Patient follows Dr. Lennette Bihari [name]-podiatry.  He was last  seen in clinic on 10/15 and started on Lyrica 75 mg as gabapentin has not been controlling neuropathic pain well. Home meds: Gabapentin 100 g 3 times daily, Lyrica 75 mg once daily -Hold gabapentin as CrCl 13.3. Consider restarting with 3108m day/in 1 dose -Hold Lyrica  -Optimize glucose control   FEN/GI: Renal diet, fluid restriction 1200 cc, Protonix Prophylaxis: Heparin  Disposition: progressive   History of Present Illness:  RCerrone Deboldis a 54y.o. male presenting with hypertensive emergency.  Patient sees PCP Dr. KMaudie Mercuryfor hypertension.  [facility].  He was started on metoprolol and amlodipine yesterday however he has been unable to pick up these medications yet. He was called this morning by Dr. BOwens Sharkwho informed him of his abnormal creatinine, potassium and phosphate.  She also recommended that he goes to the ER urgently due to elevated blood pressures over 2696systolic.   Patient has been monitoring his blood pressures at home for the last month and they have been in the 2789Fsystolic.  Patient does not like coming out due to Covid and has not seen his PCP regularly during this time.  He endorses a frontal headache earlier which is now gone.  Other symptoms include itching, cold sweats and peripheral edema.  Denies vision changes, slurring of speech, limb weakness, ataxia, chest pain, shortness of breath, palpitations, dizziness, abdominal pain, nausea or vomiting, dysuria, hematuria or frequency.  He does endorse that his urine smells foul.  Patient has received the [facility] vaccine.  Is waiting for booster when available.  Denies sick contacts  Smokes 3 cigarettes a month socially.  Drinks 4 units of liquor a week.  Denies recreational drug use but has done so in the past.  Review Of Systems: Per HPI with the following additions:   Review of Systems   Patient Active Problem List   Diagnosis Date  Noted  . Hypertensive emergency 07/27/2020  . Acute stress reaction 07/25/2020  . CKD (chronic kidney disease) stage 4, GFR 15-29 ml/min (HCC) 03/07/2020  . Hematuria 03/07/2020  . Foot callus 07/01/2018  . Hyperlipidemia 05/28/2016  . Hypertriglyceridemia 05/28/2016  . Adjustment disorder with anxiety 12/09/2014  . Erectile dysfunction 10/21/2012  . Hypertension associated with diabetes ([place] 04/14/2012  . DM (diabetes mellitus), type 2, uncontrolled ([place] 04/13/2012    Past Medical History: Past Medical History:  Diagnosis Date  . Allergy   . CKD (chronic kidney disease), stage IV ([place]   . Diabetes mellitus    Type 2 DM, Patient reports DM Type 1 diagnosd age at age 54 . Diabetic foot ulcers ([place]   . Foreign body in left foot    with  infection  . Hypertension   . Meniscus tear   . Multiple rib fractures   . Personal history of diabetic foot ulcer, Left Foot     Past Surgical History: Past Surgical History:  Procedure Laterality Date  . EYE SURGERY    . I & D EXTREMITY  04/23/2012   Procedure: IRRIGATION AND DEBRIDEMENT EXTREMITY;  Surgeon: MNewt Minion MD;  [facility]  Service: Orthopedics;  Laterality: Left;  Irrigation and Debridement Left Foot  . I & D EXTREMITY  09/18/2012   Procedure: IRRIGATION AND DEBRIDEMENT EXTREMITY;  Surgeon: MNewt Minion MD;  [facility]  Service:  Orthopedics;  Laterality: Left;  . MENISCUS REPAIR Left 04/2016   Orthopedist, Dr [name] in [place], [place]    Social History: Social History   Tobacco Use  . Smoking status: Former Smoker    Types: Cigarettes    Quit date: 09/03/2012    Years since quitting: 7.9  . Smokeless tobacco: Former Systems developer    Quit date: 09/03/2012  Substance Use Topics  . Alcohol use: Yes    Alcohol/week: 8.0 standard drinks    Types: 6 Cans of beer, 2 Shots of liquor per week    Comment: weekends.  . Drug use: No   Additional social history:  Please also refer to relevant sections of  EMR.  Family History: Family History  Problem Relation Age of Onset  . Diabetes type II Mother   . Arthritis Mother   . Diabetes Mother   . Hyperlipidemia Mother   . Hypertension Mother   . Diabetes type II Sister   . Diabetes Father    (If not completed, MUST add something in)  Allergies and Medications: Allergies  Allergen Reactions  . Latex Rash and Other (See Comments)    Pt states he is allergic to latex condoms  . Ace Inhibitors Cough   No current facility-administered medications on file prior to encounter.   Current Outpatient Medications on File Prior to Encounter  Medication Sig Dispense Refill  . acetaminophen (TYLENOL) 325 MG tablet Take 325 mg by mouth every 6 (six) hours as needed for mild pain or headache.    [name] amLODipine (NORVASC) 10 MG tablet Take 1 tablet (10 mg total) by mouth at bedtime. 90 tablet 3  . gabapentin (NEURONTIN) 100 MG capsule Take 1 capsule (100 mg total) by mouth 3 (three) times daily. (Patient taking differently: Take 100 mg by mouth at bedtime. ) 90 capsule 3  . Blood Glucose Monitoring Suppl (BLOOD GLUCOSE MONITOR SYSTEM) w/Device KIT 1 kit by Does not apply route daily. 1 kit 0  . metoprolol succinate (TOPROL-XL) 25 MG 24 hr tablet Take 1 tablet (25 mg total) by mouth at bedtime. Take with or immediately following a meal. 90 tablet 0  . pregabalin (LYRICA) 75 MG capsule Take 1 capsule (75 mg total) by mouth 2 (two) times daily. (Patient not taking: Reported on 07/26/2020) 60 capsule 0  . terbinafine (LAMISIL) 1 % cream Apply 1 application topically 2 (two) times daily. For 1 week to arms 30 g 0    Objective: BP 138/72   Pulse 71   Temp 98.5 F (36.9 C) (Oral)   Resp 18   Ht 5' 11"  (1.803 m)   Wt 89.5 kg   SpO2 96%   BMI 27.52 kg/m   Exam: General: Well-appearing 54 year old male, appears stated age, no acute distress, pleasant Eyes: No scleral icterus, PERRLA 3 mm bilaterally ENTM: No pharyngeal edema or exudates, moist mucous  membranes, no thyromegaly, lymphadenopathy or masses palpable Neck: Supple, normal range of movement Cardiovascular: S1 and S2 present, RRR Respiratory: CTAB, normal work of breathing Gastrointestinal: Abdomen soft nontender, bowel sounds MSK: Moving all 4 limbs equally, no gross deformity Derm: Warm and dry warm Neuro: Cranial nerve exam normal Psych: Normal mood, normal affect  Labs and Imaging: CBC BMET  Recent Labs  Lab 07/26/20 1441  WBC 9.5  HGB 11.1*  HCT 35.1*  PLT 208   Recent Labs  Lab 07/26/20 1441  NA 141  K 5.7*  CL 116*  CO2 15*  BUN 60*  CREATININE 7.06*  GLUCOSE 90  CALCIUM 7.9*     EKG: My own interpretation sinus rhythm    Lattie Haw, MD 07/27/2020, 2:58 AM PGY-2, [place] Intern pager: [phone], text pages welcome

## 2020-07-26 NOTE — Telephone Encounter (Signed)
Patient seen yesterday in office. He has not been taking his BP medications regularly and BP was markedly elevated in office. Labs reviewed this AM, has marked elevation in creatinine and phosphorous. Left generic voicemail to call back ASAP. If he calls back, please direct him to RN  Line--he needs to directly to ED given markedly elevated BP, creatinine, and electrolytes.   Dorris Singh, MD  Family Medicine Teaching Service

## 2020-07-27 DIAGNOSIS — E11621 Type 2 diabetes mellitus with foot ulcer: Secondary | ICD-10-CM | POA: Diagnosis present

## 2020-07-27 DIAGNOSIS — E1165 Type 2 diabetes mellitus with hyperglycemia: Secondary | ICD-10-CM | POA: Diagnosis present

## 2020-07-27 DIAGNOSIS — N529 Male erectile dysfunction, unspecified: Secondary | ICD-10-CM | POA: Diagnosis present

## 2020-07-27 DIAGNOSIS — E875 Hyperkalemia: Secondary | ICD-10-CM | POA: Diagnosis present

## 2020-07-27 DIAGNOSIS — F17210 Nicotine dependence, cigarettes, uncomplicated: Secondary | ICD-10-CM | POA: Diagnosis present

## 2020-07-27 DIAGNOSIS — N179 Acute kidney failure, unspecified: Secondary | ICD-10-CM | POA: Diagnosis present

## 2020-07-27 DIAGNOSIS — Z20822 Contact with and (suspected) exposure to covid-19: Secondary | ICD-10-CM | POA: Diagnosis present

## 2020-07-27 DIAGNOSIS — I161 Hypertensive emergency: Secondary | ICD-10-CM | POA: Diagnosis present

## 2020-07-27 DIAGNOSIS — E781 Pure hyperglyceridemia: Secondary | ICD-10-CM | POA: Diagnosis present

## 2020-07-27 DIAGNOSIS — L97509 Non-pressure chronic ulcer of other part of unspecified foot with unspecified severity: Secondary | ICD-10-CM | POA: Diagnosis present

## 2020-07-27 DIAGNOSIS — E785 Hyperlipidemia, unspecified: Secondary | ICD-10-CM | POA: Diagnosis present

## 2020-07-27 DIAGNOSIS — R8281 Pyuria: Secondary | ICD-10-CM | POA: Diagnosis present

## 2020-07-27 DIAGNOSIS — F14929 Cocaine use, unspecified with intoxication, unspecified: Secondary | ICD-10-CM | POA: Diagnosis present

## 2020-07-27 DIAGNOSIS — F430 Acute stress reaction: Secondary | ICD-10-CM | POA: Diagnosis present

## 2020-07-27 DIAGNOSIS — I129 Hypertensive chronic kidney disease with stage 1 through stage 4 chronic kidney disease, or unspecified chronic kidney disease: Secondary | ICD-10-CM | POA: Diagnosis present

## 2020-07-27 DIAGNOSIS — E872 Acidosis: Secondary | ICD-10-CM | POA: Diagnosis present

## 2020-07-27 DIAGNOSIS — N184 Chronic kidney disease, stage 4 (severe): Secondary | ICD-10-CM | POA: Diagnosis present

## 2020-07-27 DIAGNOSIS — I10 Essential (primary) hypertension: Secondary | ICD-10-CM | POA: Diagnosis not present

## 2020-07-27 DIAGNOSIS — D539 Nutritional anemia, unspecified: Secondary | ICD-10-CM | POA: Diagnosis present

## 2020-07-27 DIAGNOSIS — G629 Polyneuropathy, unspecified: Secondary | ICD-10-CM | POA: Diagnosis present

## 2020-07-27 DIAGNOSIS — E1169 Type 2 diabetes mellitus with other specified complication: Secondary | ICD-10-CM | POA: Diagnosis present

## 2020-07-27 DIAGNOSIS — L299 Pruritus, unspecified: Secondary | ICD-10-CM | POA: Diagnosis present

## 2020-07-27 DIAGNOSIS — R21 Rash and other nonspecific skin eruption: Secondary | ICD-10-CM | POA: Diagnosis present

## 2020-07-27 DIAGNOSIS — E1122 Type 2 diabetes mellitus with diabetic chronic kidney disease: Secondary | ICD-10-CM | POA: Diagnosis present

## 2020-07-27 DIAGNOSIS — R809 Proteinuria, unspecified: Secondary | ICD-10-CM | POA: Diagnosis present

## 2020-07-27 LAB — RENAL FUNCTION PANEL
Albumin: 2.5 g/dL — ABNORMAL LOW (ref 3.5–5.0)
Anion gap: 7 (ref 5–15)
BUN: 65 mg/dL — ABNORMAL HIGH (ref 6–20)
CO2: 15 mmol/L — ABNORMAL LOW (ref 22–32)
Calcium: 7.7 mg/dL — ABNORMAL LOW (ref 8.9–10.3)
Chloride: 118 mmol/L — ABNORMAL HIGH (ref 98–111)
Creatinine, Ser: 6.92 mg/dL — ABNORMAL HIGH (ref 0.61–1.24)
GFR, Estimated: 8 mL/min — ABNORMAL LOW (ref >60)
Glucose, Bld: 84 mg/dL (ref 70–99)
Phosphorus: 7 mg/dL — ABNORMAL HIGH (ref 2.5–4.6)
Potassium: 5.2 mmol/L — ABNORMAL HIGH (ref 3.5–5.1)
Sodium: 140 mmol/L (ref 135–145)

## 2020-07-27 LAB — IRON AND TIBC
Iron: 120 ug/dL (ref 45–182)
Saturation Ratios: 45 % — ABNORMAL HIGH (ref 17.9–39.5)
TIBC: 267 ug/dL (ref 250–450)
UIBC: 147 ug/dL

## 2020-07-27 LAB — RAPID URINE DRUG SCREEN, HOSP PERFORMED
Amphetamines: NOT DETECTED
Barbiturates: NOT DETECTED
Benzodiazepines: NOT DETECTED
Cocaine: POSITIVE — AB
Opiates: NOT DETECTED
Tetrahydrocannabinol: NOT DETECTED

## 2020-07-27 LAB — KAPPA/LAMBDA LIGHT CHAINS
Kappa free light chain: 170.6 mg/L — ABNORMAL HIGH (ref 3.3–19.4)
Kappa, lambda light chain ratio: 1.52 (ref 0.26–1.65)
Lambda free light chains: 112.5 mg/L — ABNORMAL HIGH (ref 5.7–26.3)

## 2020-07-27 LAB — GLUCOSE, CAPILLARY
Glucose-Capillary: 93 mg/dL (ref 70–99)
Glucose-Capillary: 96 mg/dL (ref 70–99)

## 2020-07-27 LAB — CBG MONITORING, ED
Glucose-Capillary: 68 mg/dL — ABNORMAL LOW (ref 70–99)
Glucose-Capillary: 83 mg/dL (ref 70–99)
Glucose-Capillary: 99 mg/dL (ref 70–99)

## 2020-07-27 LAB — FERRITIN: Ferritin: 155 ng/mL (ref 24–336)

## 2020-07-27 LAB — HEPATITIS B SURFACE ANTIBODY, QUANTITATIVE: Hep B S AB Quant (Post): <3.1 m[IU]/mL — ABNORMAL LOW (ref >9.9)

## 2020-07-27 LAB — VITAMIN B12: Vitamin B-12: 302 pg/mL (ref 180–914)

## 2020-07-27 LAB — FOLATE: Folate: 6.5 ng/mL (ref >5.9)

## 2020-07-27 MED ORDER — ACETAMINOPHEN 325 MG PO TABS
325.0000 mg | ORAL_TABLET | Freq: Four times a day (QID) | ORAL | Status: DC | PRN
  Administered 2020-07-27 – 2020-07-28 (×2): 325 mg via ORAL
  Filled 2020-07-27 (×2): qty 1

## 2020-07-27 MED ORDER — AMLODIPINE BESYLATE 10 MG PO TABS
10.0000 mg | ORAL_TABLET | Freq: Every day | ORAL | Status: DC
  Administered 2020-07-27 – 2020-07-29 (×3): 10 mg via ORAL
  Filled 2020-07-27: qty 2
  Filled 2020-07-27 (×2): qty 1

## 2020-07-27 MED ORDER — CARVEDILOL 12.5 MG PO TABS
12.5000 mg | ORAL_TABLET | Freq: Two times a day (BID) | ORAL | Status: DC
  Administered 2020-07-27 – 2020-07-29 (×5): 12.5 mg via ORAL
  Filled 2020-07-27 (×2): qty 1
  Filled 2020-07-27: qty 4
  Filled 2020-07-27 (×3): qty 1

## 2020-07-27 MED ORDER — HYDRALAZINE HCL 10 MG PO TABS
10.0000 mg | ORAL_TABLET | Freq: Three times a day (TID) | ORAL | Status: DC
  Administered 2020-07-27 – 2020-07-28 (×3): 10 mg via ORAL
  Filled 2020-07-27 (×3): qty 1

## 2020-07-27 MED ORDER — HEPARIN SODIUM (PORCINE) 5000 UNIT/ML IJ SOLN
5000.0000 [IU] | Freq: Three times a day (TID) | INTRAMUSCULAR | Status: DC
  Administered 2020-07-27 – 2020-07-29 (×6): 5000 [IU] via SUBCUTANEOUS
  Filled 2020-07-27 (×6): qty 1

## 2020-07-27 MED ORDER — LABETALOL HCL 5 MG/ML IV SOLN: 20.0000 mg | INTRAVENOUS | Status: DC | PRN

## 2020-07-27 MED ORDER — LABETALOL HCL 5 MG/ML IV SOLN
10.0000 mg | INTRAVENOUS | Status: DC | PRN
  Administered 2020-07-27: 10 mg via INTRAVENOUS
  Filled 2020-07-27: qty 4

## 2020-07-27 MED ORDER — SEVELAMER CARBONATE 800 MG PO TABS
800.0000 mg | ORAL_TABLET | Freq: Three times a day (TID) | ORAL | Status: DC
  Administered 2020-07-27 – 2020-07-29 (×8): 800 mg via ORAL
  Filled 2020-07-27 (×9): qty 1

## 2020-07-27 NOTE — Progress Notes (Signed)
[facility] Progress Note    Assessment/ Plan:   AKI on CKD4: suspecting this secondary to progression of disease versus HTN emergency. 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 Labs today are overall stable, if no improvement tomorrow then will need a tunneled dialysis catheter and will need to start HD. Discussed the possibility of this and patient is agreeable -We will check serologies first as listed below, otherwise I do not think we need a biopsy at this junction and especially as management will not necessarily change.  Also, his blood pressure is a limiting factor to obtaining a biopsy. Once BP is better can consider this -renal US without obstruction -ua w/ sediment exam reviewed, consider urine culture -Continue to monitor daily Cr, Dose meds for GFR<15 -Monitor Daily I/Os, Daily weight  -Maintain MAP>65 for optimal renal perfusion.  -Agree with holding ACE-I, avoid further nephrotoxins including NSAIDS, Morphine.  Unless absolutely necessary, avoid CT with contrast and/or MRI with gadolinium.     Abnormal UA/pyuria -consider urine culture  Nephrotic Range Proteinuria: -secondary work: SPEP w/ IF, FLC pending -Hep B (hep sab low/neg) & C neg, HIV neg -uacr 7,426, UPC 13g  Hyperkalemia: from advanced kidney disease, also suspecting he has RTA4 physiology from diabetes -lokelma 10g tid -bicarb supplementation  Metabolic acidosis (non-anion gap): nahco3 1300mg  tid  Hypertension, uncontrolled: Consider echo and renal art duplex to r/o RAS -ordered home norvasc, added coreg. Can consider hydralazine or cardura as a third agent  CKD-MBD: check pth. Started renvela 800mg   tidac  Microscopic Hematuria -ANCA+ANA serologies pending. If still present with no concerns of vasculitis then can likely follow up with GU as an outpatient  Macrocytic anemia: -Transfuse for Hgb<7 g/dL -please check b12, folate, and iron panel  Uncontrolled Diabetes Mellitus Type 2 :mgmt per primary service   Subjective:   No events, patient feels well. BP higher this am, was uncomfortable from being in the ER stretcher. He did speak to a lot of his family members who are dialysis and he feels better about it now.   Objective:   BP (!) 165/82   Pulse 68   Temp 98.5 F (36.9 C) (Oral)   Resp 13   Ht 5\' 11"  (1.803 m)   Wt 89.5 kg   SpO2 99%   BMI 27.52 kg/m   Intake/Output Summary (Last 24 hours) at 07/27/2020 1120 Last data filed at 07/26/2020 1654 Gross per 24 hour  Intake 1000 ml  Output --  Net 1000 ml   Weight change:   Physical Exam: Gen:nad CVS:s1s2, rrr, no m/r/g Resp:cta bl, no w/r/r/c, unlabored, bl chest expansion MOL:MBEM, nt/nd LJQ:GBEEF pedal edema Neuro: speech clear and coherent, moves all ext spontaneously, no asterixis  Imaging: US RENAL  Result Date: 07/26/2020 CLINICAL DATA:  Acute kidney injury EXAM: RENAL / URINARY TRACT ULTRASOUND COMPLETE COMPARISON:  CT 06/29/2015 FINDINGS: Right Kidney: Renal measurements: 10.1 x 4.8 x 6.3 cm = volume: 160.1 mL. Cortex is echogenic. No mass or hydronephrosis. Trace perinephric fluid at the lower pole Left Kidney: Renal measurements: 11.7 x 5.2 x 5.2 cm = volume: 165 mL. Cortex is echogenic. No hydronephrosis or mass Bladder: Appears normal for degree of bladder distention. Other: None. IMPRESSION: 1. Echogenic kidneys bilaterally  consistent with medical renal disease. No hydronephrosis Electronically Signed   By: Donavan Foil M.D.   On: 07/26/2020 17:16    Labs: BMET Recent Labs  Lab 07/25/20 1559 07/26/20 1441 07/27/20 0319  NA 143 141 140  K 5.4* 5.7* 5.2*  CL 115* 116* 118*  CO2 15* 15*  15*  GLUCOSE 80 90 84  BUN 63* 60* 65*  CREATININE 6.77* 7.06* 6.92*  CALCIUM 7.9* 7.9* 7.7*  PHOS 7.3*  --  7.0*   CBC Recent Labs  Lab 07/25/20 1559 07/26/20 1441  WBC 9.8 9.5  NEUTROABS 6.1 6.4  HGB 10.4* 11.1*  HCT 30.4* 35.1*  MCV 96 102.0*  PLT 196 208    Medications:    . amLODipine  10 mg Oral Daily  . carvedilol  12.5 mg Oral BID WC  . heparin  5,000 Units Subcutaneous Q8H  . hydrALAZINE  10 mg Oral TID  . sevelamer carbonate  800 mg Oral TID WC  . sodium bicarbonate  1,300 mg Oral TID  . sodium zirconium cyclosilicate  10 g Oral TID      Gean Quint, MD [facility] 07/27/2020, 11:20 AM

## 2020-07-27 NOTE — Plan of Care (Signed)

## 2020-07-27 NOTE — ED Notes (Signed)
Pt c/o rib pain, states he had broken several ribs a couple years ago and the bed is making his pain worse. Given PRN tylenol.  Pt expressing wishes to leave AMA, pt educated on risk of leaving and reason to stay. Pt willing to stay and talk with provider at morning rounds.

## 2020-07-27 NOTE — ED Notes (Signed)
Pt ambulatory to restroom, even steady gait. Denies feeling lightheaded or dizzy. Pt denies pain at this time Respirations are even and non-labored, skin is warm, dry and intact  Call light within reach.

## 2020-07-27 NOTE — ED Notes (Signed)
Pt placed on hospital bed for comfort.

## 2020-07-27 NOTE — Progress Notes (Signed)
Family Medicine Teaching Service Daily Progress Note Intern Pager: [phone]  Patient name: [name] Medical record number: [id number] Date of birth: 04-Jan-1966 Age: 54 y.o. Gender: male  Primary Care Provider: Wilber Oliphant, MD Consultants: Nephrology Code Status: Full  Pt Overview and Major Events to Date:  10/20 Admitted  Assessment and Plan:  Isahi Salinas is a 54 y.o. male presenting with hypertensive emergency. PMH is significant for hypertension, CKD, type 2 diabetes.   Hypertensive emergency  Was seen at [facility] clinic 32/44 where systolic blood pressures were 010 and diastolic of 272.  Has been monitoring Bps at home for the last month and they have been in the 536U systolic. Patient was prescribed amlodipine and metoprolol yesterday but has been unable to collect them.  On admission BP 214/106. S/p metoprolol 5mg , labetalol 20mg , hydralazine 10mg  in the ED after which blood pressures have been 440-347 systolic.  -Renal diet with fluid restriction 1200 cc -Q1H BPs -Goal BP 160-180/<110 mmHg for the next 23 hours  -Reduce MAP by 25% -Start oral Hydralazine 10 mg TID at 2 PM -Labetalol 10 mg IV q2 hours discontinued -AM CBC, renal function panel, PTH, calcium, vitamin B-12, folate, ferritin -Low threshold for CCM involvement   AKI on CKD stage IV Worsening disease likely secondary to diabetes and uncontrolled hypertension. Creatinine 7> 6.77 yesterday. BUN 60. Today Creatine is 6.92. -Nephrology following, appreciate recommendations  -May need to start dialysis while in hospital -Follow-up renal ultrasound -Avoid nephrotoxic agents i.e. ACE inhibitor's, NSAIDs, avoid CT with contrast or MRI with gadolinium -Dose all medications to GFR< 15 -Continue bicarb infusion 1300 mg 3 times daily -I's and O's -Renal function panel in a.m.   Proteinuria Elevated urine protein creatinine ratio-13 -Nephrology following, appreciate recommendations -Follow-up SPEP w/ IF, FLC, Hep B  and C panel, HIV   Hyperkalemia On admission K 5.7, likely secondary to worsening CKD and possible RTA4 from diabetes. Today K is 5.2. -Nephrology following, appreciate recommendations -Continue Lokelma 10 mg 3 times daily -Continue bicarb infusion 1300 mg 3 times daily   Metabolic acidosis, non-anion gap -Continue bicarb infusion per nephrology   Type 2 DM, diabetic foot ulcer CBG 120 on admission.  Last A1c 5.2 on 07/25/2020. Denies polyuria, polydipsia or blurred vision Home meds: Glipizide 5 mg, unsure of compliance -CBGs every 4 hourly -Sensitive sign scale if needed    Anemia, macrocytic Hb 11.1, MCV 102.  Pt drinks 4 units hard liquor every week. -Transfusion threshold <7 -Monitor with CBC -Follow-up folate, B12 and iron panel   Tobacco substance disorder Smokes socially, threes cigarettes a month for the last 20 years -Offer nicotine patch    Peripheral neuropathy Patient follows Dr. Lennette Bihari Patel-podiatry.  He was last seen in clinic on 10/15 and started on Lyrica 75 mg as gabapentin has not been controlling neuropathic pain well. Home meds: Gabapentin 100 g 3 times daily, Lyrica 75 mg once daily -Hold gabapentin as CrCl 13.3. Consider restarting with 300mg  day/in 1 dose -Hold Lyrica  -Optimize glucose control    FEN/GI: Renal diet with fluid restriction 1200 cc PPx: Heparin  Disposition: Progressive  Prior to Admission Living Arrangement: Home Anticipated Discharge Location: Home Barriers to Discharge: HTN Management Anticipated discharge in approximately 1-3 day(s).   Subjective:  Interviewed patient at bedside in the ED.  He reports feeling well. Denies and headache or changes in vision. Discussed seriousness of hypertension and state of his kidneys. Discussed that Nephrology may recommend starting dialysis. He had no other  complaints at present.   Objective: Temp:  [98.5 F (36.9 C)] 98.5 F (36.9 C) (10/20 1437) Pulse Rate:  [71-95] 76 (10/21  0600) Resp:  [10-25] 20 (10/21 0345) BP: (133-214)/(61-106) 188/92 (10/21 0600) SpO2:  [95 %-100 %] 96 % (10/21 0600) Weight:  [197 lb 5 oz (89.5 kg)] 197 lb 5 oz (89.5 kg) (10/20 1437) Physical Exam:  Physical Exam Vitals and nursing note reviewed.  Constitutional:      General: He is not in acute distress.    Appearance: Normal appearance. He is not ill-appearing, toxic-appearing or diaphoretic.  HENT:     Head: Normocephalic and atraumatic.  Cardiovascular:     Rate and Rhythm: Normal rate and regular rhythm.     Pulses: Normal pulses.          Radial pulses are 2+ on the right side and 2+ on the left side.       Dorsalis pedis pulses are 2+ on the right side and 2+ on the left side.     Heart sounds: Normal heart sounds, S1 normal and S2 normal. No murmur heard.   Pulmonary:     Effort: Pulmonary effort is normal. No respiratory distress.     Breath sounds: Normal breath sounds. No wheezing.  Abdominal:     General: There is no distension.     Palpations: There is no mass.     Tenderness: There is no abdominal tenderness.  Musculoskeletal:     Right lower leg: No edema.     Left lower leg: No edema.  Neurological:     Mental Status: He is alert. Mental status is at baseline.  Psychiatric:        Mood and Affect: Mood normal.        Behavior: Behavior normal.        Thought Content: Thought content normal.      Laboratory: Recent Labs  Lab 07/25/20 1559 07/26/20 1441  WBC 9.8 9.5  HGB 10.4* 11.1*  HCT 30.4* 35.1*  PLT 196 208   Recent Labs  Lab 07/25/20 1559 07/26/20 1441 07/27/20 0319  NA 143 141 140  K 5.4* 5.7* 5.2*  CL 115* 116* 118*  CO2 15* 15* 15*  BUN 63* 60* 65*  CREATININE 6.77* 7.06* 6.92*  CALCIUM 7.9* 7.9* 7.7*  PROT  --  6.4*  --   BILITOT  --  0.4  --   ALKPHOS  --  69  --   ALT  --  17  --   AST  --  18  --   GLUCOSE 80 90 84    Iron/TIBC/Ferritin/ %Sat    Component Value Date/Time   IRON 120 07/27/2020 0319   TIBC 267  07/27/2020 0319   FERRITIN 155 07/27/2020 0319   IRONPCTSAT 45 (H) 07/27/2020 0319    B12: 302 (low WNL) Folate: 6.5 (WNL) Hep B Surface antibody: <3.1 (Low) Hep B Surface Antibody: Non Reactive Hep B Core Antibody: Non Reactive Hep C antibody : Non Reactive Ammonia: 20 (WNL) HIV: Non Reactive CK: 416 (high)   Imaging/Diagnostic Tests:   US Renal: Echogenic kidneys bilaterally consistent with medical renal disease. No hydronephrosis   Briant Cedar, MD 07/27/2020, 6:23 AM PGY-1, [place] Intern pager: [phone], text pages welcome

## 2020-07-27 NOTE — Progress Notes (Signed)
FPTS Interim Progress Note  S: Was paged by nurse that patient had changed clothes and was going to leave AMA. Went to ED with Dr. [name] to speak with patient. When we got to the room patient said he was very anxious and wanting to leave. He reports that he does not want any medications for anxiety and he is wanting to leave and will schedule outpatient follow up. States that he can "take pills at home." We discussed that due to the acute nature of his kidney failure he did not have the time to wait for outpatient follow up. That if he were to leave it would be fatal within 1 or 2 weeks from ammonia buildup, if an arrhythmia due to hyperkalemia did not become fatal sooner. He was still not wanting to stay, whoever, when his wife came into the room he did seem to calm down some. She urged him to stay after we discussed with her the risks of leaving now. He then asked if it would be possible to not be hooked up to the cardiac monitoring since it was hard to go to the bathroom due to having to untangle himself. He also asked if his IV could be moved from his Right arm to his left as he is Right Handed. He asked if pressures could be checked less often. We agreed that since his BP was better controlled we could decrease vital checks to every 4 hours so he could remove the cuff. We said we would ask his nurse if it would be possible to move his IV to his other arm. And we said we would inquire if he could be placed on the telemetry box while in the ED. He also wanted to know when he would get a room and get out of the ED. We discussed that we had no control over that but that he was admitted so would get a bed as soon as one became available. He was agreeable to continue staying.    O: BP (!) 174/87   Pulse 69   Temp 98.5 F (36.9 C) (Oral)   Resp 15   Ht 5\' 11"  (1.803 m)   Wt 197 lb 5 oz (89.5 kg)   SpO2 100%   BMI 27.52 kg/m     A/P: Changed vitals to q4. Asked nurse if it possible to switch IV sites  and place him on telemetry box.  Briant Cedar, MD 07/27/2020, 3:26 PM PGY-1, [place] [facility] Service pager [phone]

## 2020-07-28 LAB — GLUCOSE, CAPILLARY
Glucose-Capillary: 116 mg/dL — ABNORMAL HIGH (ref 70–99)
Glucose-Capillary: 126 mg/dL — ABNORMAL HIGH (ref 70–99)
Glucose-Capillary: 73 mg/dL (ref 70–99)
Glucose-Capillary: 79 mg/dL (ref 70–99)
Glucose-Capillary: 82 mg/dL (ref 70–99)
Glucose-Capillary: 86 mg/dL (ref 70–99)
Glucose-Capillary: 92 mg/dL (ref 70–99)

## 2020-07-28 LAB — RENAL FUNCTION PANEL
Albumin: 2.4 g/dL — ABNORMAL LOW (ref 3.5–5.0)
Anion gap: 10 (ref 5–15)
BUN: 67 mg/dL — ABNORMAL HIGH (ref 6–20)
CO2: 17 mmol/L — ABNORMAL LOW (ref 22–32)
Calcium: 7.7 mg/dL — ABNORMAL LOW (ref 8.9–10.3)
Chloride: 113 mmol/L — ABNORMAL HIGH (ref 98–111)
Creatinine, Ser: 6.75 mg/dL — ABNORMAL HIGH (ref 0.61–1.24)
GFR, Estimated: 9 mL/min — ABNORMAL LOW (ref >60)
Glucose, Bld: 123 mg/dL — ABNORMAL HIGH (ref 70–99)
Phosphorus: 6.7 mg/dL — ABNORMAL HIGH (ref 2.5–4.6)
Potassium: 4 mmol/L (ref 3.5–5.1)
Sodium: 140 mmol/L (ref 135–145)

## 2020-07-28 LAB — PTH, INTACT AND CALCIUM
Calcium, Total (PTH): 7.5 mg/dL — ABNORMAL LOW (ref 8.7–10.2)
PTH: 138 pg/mL — ABNORMAL HIGH (ref 15–65)

## 2020-07-28 LAB — CBC
HCT: 28.2 % — ABNORMAL LOW (ref 39.0–52.0)
Hemoglobin: 9.2 g/dL — ABNORMAL LOW (ref 13.0–17.0)
MCH: 32.2 pg (ref 26.0–34.0)
MCHC: 32.6 g/dL (ref 30.0–36.0)
MCV: 98.6 fL (ref 80.0–100.0)
Platelets: 164 10*3/uL (ref 150–400)
RBC: 2.86 MIL/uL — ABNORMAL LOW (ref 4.22–5.81)
RDW: 13.4 % (ref 11.5–15.5)
WBC: 10.5 10*3/uL (ref 4.0–10.5)
nRBC: 0 % (ref 0.0–0.2)

## 2020-07-28 LAB — ANCA TITERS
Atypical P-ANCA titer: <1:20 {titer}
C-ANCA: <1:20 {titer}
P-ANCA: <1:20 {titer}

## 2020-07-28 LAB — PROTEIN ELECTROPHORESIS, SERUM
A/G Ratio: 1 (ref 0.7–1.7)
Albumin ELP: 3 g/dL (ref 2.9–4.4)
Alpha-1-Globulin: 0.2 g/dL (ref 0.0–0.4)
Alpha-2-Globulin: 0.8 g/dL (ref 0.4–1.0)
Beta Globulin: 1 g/dL (ref 0.7–1.3)
Gamma Globulin: 0.9 g/dL (ref 0.4–1.8)
Globulin, Total: 2.9 g/dL (ref 2.2–3.9)
Total Protein ELP: 5.9 g/dL — ABNORMAL LOW (ref 6.0–8.5)

## 2020-07-28 LAB — ANTI-DNA ANTIBODY, DOUBLE-STRANDED: ds DNA Ab: 1 IU/mL (ref 0–9)

## 2020-07-28 LAB — ANTIEXTRACTABLE NUCLEAR AG
ENA SM Ab Ser-aCnc: <0.2 AI (ref 0.0–0.9)
Ribonucleic Protein: 0.2 AI (ref 0.0–0.9)

## 2020-07-28 LAB — C3 COMPLEMENT: C3 Complement: 107 mg/dL (ref 82–167)

## 2020-07-28 LAB — C4 COMPLEMENT: Complement C4, Body Fluid: 26 mg/dL (ref 12–38)

## 2020-07-28 LAB — ANA W/REFLEX IF POSITIVE: Anti Nuclear Antibody (ANA): NEGATIVE

## 2020-07-28 MED ORDER — HYDRALAZINE HCL 50 MG PO TABS: 50.0000 mg | ORAL_TABLET | Freq: Three times a day (TID) | ORAL | Status: DC

## 2020-07-28 MED ORDER — HYDRALAZINE HCL 25 MG PO TABS
37.5000 mg | ORAL_TABLET | Freq: Three times a day (TID) | ORAL | Status: DC
  Administered 2020-07-28 – 2020-07-29 (×3): 37.5 mg via ORAL
  Filled 2020-07-28 (×3): qty 2

## 2020-07-28 MED ORDER — HYDRALAZINE HCL 25 MG PO TABS
25.0000 mg | ORAL_TABLET | Freq: Three times a day (TID) | ORAL | Status: DC
  Administered 2020-07-28: 25 mg via ORAL
  Filled 2020-07-28: qty 1

## 2020-07-28 MED ORDER — SODIUM ZIRCONIUM CYCLOSILICATE 10 G PO PACK
10.0000 g | PACK | Freq: Two times a day (BID) | ORAL | Status: DC
  Administered 2020-07-28: 10 g via ORAL
  Filled 2020-07-28 (×2): qty 1
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
## 2020-07-28 NOTE — Progress Notes (Signed)
Family Medicine Teaching Service Daily Progress Note Intern Pager: [phone]  Patient name: [name] Medical record number: [id number] Date of birth: 1966/04/28 Age: 54 y.o. Gender: male  Primary Care Provider: Wilber Oliphant, MD Consultants: Nephrology Code Status: Full  Pt Overview and Major Events to Date:  10/20 Admitted  Assessment and Plan:  [name] a 54 y.o.malepresenting with hypertensive emergency. PMH is significant forhypertension, CKD, type 2 diabetes.   Hypertensive emergency Was seen at [facility] clinic 31/51 where systolic blood pressures were 761 and diastolic of 607.  Has been monitoring Bps at home for the last month and they have been in PXT062I systolic. Patient was prescribed amlodipine and metoprolol yesterday but has been unable to collect them. On admissionBP 214/106.S/p metoprolol 5mg , labetalol 20mg , hydralazine 10mg in the EDafter which blood pressures have been 150-170systolic. -Increase oral Hydralazine to 25 mg TID -AM CBC, renal function panel, PTH, calcium, vitamin B-12, folate, ferritin -Low threshold for CCM involvement   AKI onCKDstage IV Worsening disease likely secondary to diabetes and uncontrolled hypertension. Creatinine 7>6.77 yesterday.BUN 60. Today Creatine is 6.75, BUN is 67, and Phos is 6.7. -Nephrology following, appreciate recommendations -Most likely start dialysis today -Avoid nephrotoxic agents i.e. ACE inhibitor's, NSAIDs, avoid CT with contrast or MRI with gadolinium -Dose all medications to GFR<15 -Continue bicarb infusion 1300 mg 3 times daily -I's and O's   Proteinuria Elevated urine protein creatinine ratio-13 -Nephrology following, appreciate recommendations -Follow-upSPEP w/ IF, FLC, Hep B and C panel, HIV   Hyperkalemia On admission K 5.7,likely secondary to worsening CKD and possible RTA40from diabetes. Today K is 4.0. -Nephrology following,appreciate recommendations -Continue  Lokelma 10 mg 3 times daily -Continue bicarb infusion 1300 mg 3 times daily   Metabolic acidosis,non-anion gap -Continue bicarb infusion per nephrology   Type 2 DM, diabetic foot ulcer CBG 120on admission.Last A1c 5.2 on10/19/2021. Denies polyuria, polydipsia or blurred vision Homemeds:Glipizide 5 mg,unsure of compliance -CBGs every 4 hourly -Sensitive sign scale if needed   Anemia, macrocytic Hb 11.1, MCV 102. Pt drinks 4 units hard liquor every week. Hgb today is 9.2. -Transfusion threshold<7 -Monitor with CBC -Follow-up folate, B12 and iron panel   Tobacco substance disorder Smokes socially, threes cigarettes a month for the last 20 years -Offernicotine patch    Peripheral neuropathy Patient follows Dr. Lennette Bihari Salinas-podiatry.He was last seen in clinic on 10/15 and started on Lyrica 75 mg as gabapentin has not been controlling neuropathic pain well. Home meds:Gabapentin 100 g 3 times daily,Lyrica 75 mg once daily -Hold gabapentinas CrCl 13.3. Consider restarting with 300mg  day/in 1 dose -Hold Lyrica -Optimize glucose control   FEN/GI: Renal diet with fluid restriction 1200 cc PPx: Heparin  Disposition: Progressive  Prior to Admission Living Arrangement: Home Anticipated Discharge Location: Home Barriers to Discharge: Dialysis Initiation Anticipated discharge in approximately 5-8 day(s).   Subjective:  Interviewed patient at bedside.  He reports feeling ok today and much better now that he is out of the ED. Reports that he has no complaints at the time.  Objective: Temp:  [98 F (36.7 C)-98.7 F (37.1 C)] 98.3 F (36.8 C) (10/22 0407) Pulse Rate:  [66-78] 69 (10/22 0407) Resp:  [13-21] 16 (10/22 0407) BP: (153-196)/(63-93) 177/84 (10/22 0407) SpO2:  [95 %-100 %] 95 % (10/22 0407) Weight:  [196 lb 3.2 oz (89 kg)-196 lb 11.2 oz (89.2 kg)] 196 lb 3.2 oz (89 kg) (10/22 9485) Physical Exam:  Physical Exam Vitals and nursing note  reviewed.  Constitutional:  General: He is not in acute distress.    Appearance: Normal appearance. He is not ill-appearing, toxic-appearing or diaphoretic.  HENT:     Head: Normocephalic and atraumatic.  Cardiovascular:     Rate and Rhythm: Normal rate and regular rhythm.     Pulses: Normal pulses.          Radial pulses are 2+ on the right side and 2+ on the left side.       Dorsalis pedis pulses are 2+ on the right side and 2+ on the left side.     Heart sounds: Normal heart sounds, S1 normal and S2 normal. No murmur heard.   Pulmonary:     Effort: Pulmonary effort is normal. No respiratory distress.     Breath sounds: Normal breath sounds. No wheezing.  Abdominal:     General: There is no distension.     Tenderness: There is no abdominal tenderness.  Musculoskeletal:     Right lower leg: No edema.     Left lower leg: No edema.  Neurological:     Mental Status: He is alert. Mental status is at baseline.  Psychiatric:        Mood and Affect: Mood normal.        Behavior: Behavior normal.        Thought Content: Thought content normal.      Laboratory: Recent Labs  Lab 07/25/20 1559 07/26/20 1441 07/28/20 0126  WBC 9.8 9.5 10.5  HGB 10.4* 11.1* 9.2*  HCT 30.4* 35.1* 28.2*  PLT 196 208 164   Recent Labs  Lab 07/26/20 1441 07/27/20 0319 07/28/20 0126  NA 141 140 140  K 5.7* 5.2* 4.0  CL 116* 118* 113*  CO2 15* 15* 17*  BUN 60* 65* 67*  CREATININE 7.06* 6.92* 6.75*  CALCIUM 7.9* 7.7* 7.7*  PROT 6.4*  --   --   BILITOT 0.4  --   --   ALKPHOS 69  --   --   ALT 17  --   --   AST 18  --   --   GLUCOSE 90 84 123*    Drugs of Abuse     Component Value Date/Time   LABOPIA NONE DETECTED 07/27/2020 1855   COCAINSCRNUR POSITIVE (A) 07/27/2020 1855   LABBENZ NONE DETECTED 07/27/2020 1855   AMPHETMU NONE DETECTED 07/27/2020 1855   THCU NONE DETECTED 07/27/2020 1855   LABBARB NONE DETECTED 07/27/2020 1855     Kappa Free Light Chain: 170.6 (high) Lambda  Free Light Chain: 112.5 (high) PTH: 138 (high)   Imaging/Diagnostic Tests:   US Renal: Echogenic kidneys bilaterally consistent with medical renal disease. No hydronephrosis   Earl Cedar, MD 07/28/2020, 5:58 AM PGY-1, [place] Intern pager: [phone], text pages welcome

## 2020-07-28 NOTE — Progress Notes (Signed)
OT Cancellation Note  Patient Details Name: [name] MRN: [id number] DOB: [birth year]   Cancelled Treatment:    Reason Eval/Treat Not Completed: OT screened, no needs identified, will sign off Pt is fully dressed, completing ADL independently. Mobilizing well with RN staff and PT without safety concerns. No further OT needs identified, OT will sign off. Thank you for this consult.  Zenovia Jarred, MSOT, OTR/L Acute Rehabilitation Services [facility] Office Number: [phone] Pager: [phone]  Zenovia Jarred 07/28/2020, 4:36 PM

## 2020-07-28 NOTE — Progress Notes (Signed)
[facility] Progress Note    Assessment/ Plan:   AKI on CKD4: suspecting this secondary to progression of disease versus HTN emergency/cocaine use. 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  -will hold off on biopsy as his management currently will not change. -If labs are stable/improved (provided he is not having any new symptoms and if BP remains controlled) then would be okay with discharge tomorrow -serologies pending -renal US without obstruction -ua w/ sediment exam reviewed, consider urine culture -Continue to monitor daily Cr, Dose meds for GFR<15 -Monitor Daily I/Os, Daily weight  -Maintain MAP>65 for optimal renal perfusion.  -Agree with holding ACE-I, avoid further nephrotoxins including NSAIDS, Morphine.  Unless absolutely necessary, avoid CT with contrast and/or MRI with gadolinium.     Abnormal UA/pyuria -consider urine culture  Nephrotic Range Proteinuria: -secondary work: SPEP w/ IF pending -FLC ratio 1.5 -Hep B (hep sab low/neg) & C neg, HIV neg -uacr 7,426, UPC 13g  Hyperkalemia, improved: from advanced kidney disease, also suspecting he has RTA4 physiology from diabetes -lokelma 10g tid, decreasing to BID -bicarb supplementation  Metabolic acidosis (non-anion gap): improved. nahco3 1300mg  tid  Hypertension, uncontrolled: Consider echo and renal art duplex to r/o RAS -ordered home norvasc, added coreg. Can consider hydralazine or cardura as a third agent  CKD-MBD: check pth. Started renvela 800mg  tidac  Microscopic Hematuria -ANCA+ANA/dsdna/C3/C4/ENA serologies pending. More concerning especially since he is cocaine positive. If still present with no concerns of vasculitis then can likely follow up with GU as an outpatient  Macrocytic anemia: -Transfuse for Hgb<7 g/dL -please check b12,  folate, and iron panel  Uncontrolled Diabetes Mellitus Type 2 :mgmt per primary service   Subjective:   No events, patient feels well. BP better this AM. Cr slightly better but not significantly.    Objective:   BP (!) 188/84   Pulse 76   Temp 98.3 F (36.8 C) (Oral)   Resp 16   Ht 5\' 11"  (1.803 m)   Wt 89 kg   SpO2 95%   BMI 27.36 kg/m   Intake/Output Summary (Last 24 hours) at 07/28/2020 1130 Last data filed at 07/28/2020 0700 Gross per 24 hour  Intake 220 ml  Output 300 ml  Net -80 ml   Weight change: -0.278 kg  Physical Exam: Gen:nad CVS:s1s2, rrr, no m/r/g Resp:cta bl, no w/r/r/c, unlabored, bl chest expansion UXN:ATFT, nt/nd Ext:no edema Neuro: speech clear and coherent, moves all ext spontaneously, no asterixis  Imaging: US RENAL  Result Date: 07/26/2020 CLINICAL DATA:  Acute kidney injury EXAM: RENAL / URINARY TRACT ULTRASOUND COMPLETE COMPARISON:  CT 06/29/2015 FINDINGS: Right Kidney: Renal measurements: 10.1 x 4.8 x 6.3 cm = volume: 160.1 mL. Cortex is echogenic. No mass or hydronephrosis. Trace perinephric fluid at the lower pole Left Kidney: Renal measurements: 11.7 x 5.2 x 5.2 cm = volume: 165 mL. Cortex is echogenic. No hydronephrosis or mass Bladder: Appears normal for degree of bladder distention. Other: None. IMPRESSION: 1. Echogenic kidneys bilaterally consistent with medical renal disease. No hydronephrosis Electronically Signed   By: Donavan Foil M.D.   On: 07/26/2020 17:16    Labs: BMET Recent Labs  Lab 07/25/20 1559 07/26/20 1441 07/27/20 0319 07/28/20 0126  NA 143 141 140 140  K 5.4* 5.7* 5.2* 4.0  CL 115* 116* 118* 113*  CO2 15* 15* 15* 17*  GLUCOSE 80 90 84 123*  BUN 63*  60* 65* 67*  CREATININE 6.77* 7.06* 6.92* 6.75*  CALCIUM 7.9* 7.9* 7.7*  7.5* 7.7*  PHOS 7.3*  --  7.0* 6.7*   CBC Recent Labs  Lab 07/25/20 1559 07/26/20 1441 07/28/20 0126  WBC 9.8 9.5 10.5  NEUTROABS 6.1 6.4  --   HGB 10.4* 11.1* 9.2*  HCT 30.4*  35.1* 28.2*  MCV 96 102.0* 98.6  PLT 196 208 164    Medications:    . amLODipine  10 mg Oral Daily  . carvedilol  12.5 mg Oral BID WC  . heparin  5,000 Units Subcutaneous Q8H  . hydrALAZINE  10 mg Oral TID  . sevelamer carbonate  800 mg Oral TID WC  . sodium bicarbonate  1,300 mg Oral TID  . sodium zirconium cyclosilicate  10 g Oral TID      Gean Quint, MD [facility] 07/28/2020, 11:30 AM

## 2020-07-29 DIAGNOSIS — E1165 Type 2 diabetes mellitus with hyperglycemia: Secondary | ICD-10-CM

## 2020-07-29 LAB — CBC
HCT: 27.4 % — ABNORMAL LOW (ref 39.0–52.0)
Hemoglobin: 9.1 g/dL — ABNORMAL LOW (ref 13.0–17.0)
MCH: 32.9 pg (ref 26.0–34.0)
MCHC: 33.2 g/dL (ref 30.0–36.0)
MCV: 98.9 fL (ref 80.0–100.0)
Platelets: 173 10*3/uL (ref 150–400)
RBC: 2.77 MIL/uL — ABNORMAL LOW (ref 4.22–5.81)
RDW: 13.5 % (ref 11.5–15.5)
WBC: 8.7 10*3/uL (ref 4.0–10.5)
nRBC: 0 % (ref 0.0–0.2)

## 2020-07-29 LAB — RENAL FUNCTION PANEL
Albumin: 2.4 g/dL — ABNORMAL LOW (ref 3.5–5.0)
Anion gap: 9 (ref 5–15)
BUN: 63 mg/dL — ABNORMAL HIGH (ref 6–20)
CO2: 18 mmol/L — ABNORMAL LOW (ref 22–32)
Calcium: 7.4 mg/dL — ABNORMAL LOW (ref 8.9–10.3)
Chloride: 114 mmol/L — ABNORMAL HIGH (ref 98–111)
Creatinine, Ser: 6.84 mg/dL — ABNORMAL HIGH (ref 0.61–1.24)
GFR, Estimated: 9 mL/min — ABNORMAL LOW (ref >60)
Glucose, Bld: 80 mg/dL (ref 70–99)
Phosphorus: 6.7 mg/dL — ABNORMAL HIGH (ref 2.5–4.6)
Potassium: 3.9 mmol/L (ref 3.5–5.1)
Sodium: 141 mmol/L (ref 135–145)

## 2020-07-29 LAB — GLUCOSE, CAPILLARY
Glucose-Capillary: 74 mg/dL (ref 70–99)
Glucose-Capillary: 121 mg/dL — ABNORMAL HIGH (ref 70–99)
Glucose-Capillary: 109 mg/dL — ABNORMAL HIGH (ref 70–99)

## 2020-07-29 MED ORDER — CARVEDILOL 12.5 MG PO TABS: 12.5000 mg | ORAL_TABLET | Freq: Two times a day (BID) | ORAL | 0 refills | Status: DC

## 2020-07-29 MED ORDER — HYDRALAZINE HCL 25 MG PO TABS: 37.5000 mg | ORAL_TABLET | Freq: Three times a day (TID) | ORAL | 0 refills | Status: DC

## 2020-07-29 MED ORDER — SEVELAMER CARBONATE 800 MG PO TABS: 800.0000 mg | ORAL_TABLET | Freq: Three times a day (TID) | ORAL | 0 refills | Status: AC

## 2020-07-29 MED ORDER — SODIUM BICARBONATE 650 MG PO TABS: 1300.0000 mg | ORAL_TABLET | Freq: Three times a day (TID) | ORAL | 0 refills | Status: AC

## 2020-07-29 MED ORDER — LOKELMA 10 G PO PACK: 10.0000 g | PACK | Freq: Two times a day (BID) | ORAL | 0 refills | Status: DC

## 2020-07-29 MED ORDER — SODIUM POLYSTYRENE SULFONATE 15 GM/60ML PO SUSP: 15.0000 g | Freq: Every day | ORAL | 4 refills | Status: DC

## 2020-07-29 NOTE — Discharge Instructions (Signed)
Dear [name], It was a pleasure taking care of you. You were admitted for Hypertensive Emergency and acute kidney injury. We have started you on medications to control your blood pressure please make sure to take them as ordered. You have a follow up appointment on Wednesday 10/27 please make sure you attend this and bring with you a list of recorded blood pressures to assess how you are responding to your medications. Due to your high blood pressure your kidneys were damaged and there was concern you would have to start Dialysis, your kidneys did recover and so Dialysis did not need to be started. Please make sure you attend the follow up appointment with Nephrology.   Thank you very much

## 2020-07-29 NOTE — Discharge Summary (Addendum)
[facility] Discharge Summary  Patient name: [name] Medical record number: [id number] Date of birth: 06-20-1966 Age: 54 y.o. Gender: male Date of Admission: 07/26/2020  Date of Discharge: 07/29/2020 Admitting Physician: Blane Ohara McDiarmid, MD  Primary Care Provider: Wilber Oliphant, MD Consultants: Nephrology  Indication for Hospitalization: Hypertensive Emergency  Discharge Diagnoses/Problem List:  HTN AKI onCKDstage IV Metabolic acidosis DM2 Macrocytic anemia Peripheral neuropathy  Disposition: Discharge Home  Discharge Condition: Stable  Discharge Exam: Physical Exam Vitals and nursing note reviewed.  Constitutional:      General: He is not in acute distress.    Appearance: Normal appearance. He is normal weight. He is not ill-appearing, toxic-appearing or diaphoretic.  HENT:     Head: Normocephalic and atraumatic.  Cardiovascular:     Rate and Rhythm: Normal rate and regular rhythm.     Pulses: Normal pulses.          Radial pulses are 2+ on the right side and 2+ on the left side.       Dorsalis pedis pulses are 2+ on the right side and 2+ on the left side.     Heart sounds: Normal heart sounds, S1 normal and S2 normal.  Pulmonary:     Effort: Pulmonary effort is normal. No respiratory distress.     Breath sounds: Normal breath sounds. No wheezing.  Abdominal:     General: There is no distension.     Palpations: There is no mass.     Tenderness: There is no abdominal tenderness.  Musculoskeletal:     Right lower leg: No edema.     Left lower leg: No edema.  Neurological:     Mental Status: He is alert. Mental status is at baseline.  Psychiatric:        Mood and Affect: Mood normal.        Behavior: Behavior normal.        Thought Content: Thought content normal.     Brief Hospital Course:  HTN: Patient was seen at [facility] clinic 71/24 where systolic blood pressures were 580 and diastolic of 998. With only slight improvement on  repeat blood pressures he was sent to the ED for evaluation. Initially BP 214/106. S/p metoprolol 32m, labetalol 295m hydralazine 1019mn the ED after which blood pressures have been 150338-250stolic. He had no signs of neurologic damage (no evidence of encephalopathy or or other neurological symptoms) or cardiac damage (denies chest pain and EKG without ischemic changes). Did have acute worsening of Kidney function with creatinine 7, BUN 60. Started on Amlodipine and Carvedilol with BP goal of Systolic [id number] the first 24 hours. Was then started on Hydralazine for better control, on discharge his BP was 151/78.    AKI onCKDstage IV: Has baseline Creatinine around 3.6. On admission Creatinine was 7, K was 5.7, and Phos was 7.0, AKI most likely secondary to Hypertensive Emergency. Nephrology started Sodium bicarb and Lokelma. Initially had concern for starting Dialysis while admitted but it was decided he did not need dialysis while in the hospital after his kidney function stabilized. Pt was discharged with sodium bicarbonate, kayexolate (could not afford lokelma), and renvela.     Peripheral neuropathy Home medications were held due to acute renal failure.      Issues for Follow Up:  1. HTN: Asses BP control and add or change dosage of antihypertensives as needed.   2. AKI onCKDstage IV: Ensure that outpatient appointment with Nephrology is kept. Obtain BMP  to determine if kayexolate needs to be continued.  3. Make sure pt is compliant with medication changes as outlined below.     Significant Procedures: None  Significant Labs and Imaging:  Recent Labs  Lab 07/26/20 1441 07/28/20 0126 07/29/20 0213  WBC 9.5 10.5 8.7  HGB 11.1* 9.2* 9.1*  HCT 35.1* 28.2* 27.4*  PLT 208 164 173   Recent Labs  Lab 07/25/20 1559 07/25/20 1559 07/26/20 1441 07/26/20 1441 07/27/20 0319 07/27/20 0319 07/28/20 0126 07/29/20 0209  NA 143  --  141  --  140  --  140 141  K 5.4*   < > 5.7*    < > 5.2*   < > 4.0 3.9  CL 115*  --  116*  --  118*  --  113* 114*  CO2 15*  --  15*  --  15*  --  17* 18*  GLUCOSE 80  --  90  --  84  --  123* 80  BUN 63*  --  60*  --  65*  --  67* 63*  CREATININE 6.77*  --  7.06*  --  6.92*  --  6.75* 6.84*  CALCIUM 7.9*   < > 7.9*  --  7.7*  7.5*  --  7.7* 7.4*  PHOS 7.3*  --   --   --  7.0*  --  6.7* 6.7*  ALKPHOS  --   --  69  --   --   --   --   --   AST  --   --  18  --   --   --   --   --   ALT  --   --  17  --   --   --   --   --   ALBUMIN 3.7*  --  3.0*  --  2.5*  --  2.4* 2.4*   < > = values in this interval not displayed.   ANCA titers: WNL C3: 107 (WNL) C4: 26 (WNL) PTH: 138 (high)   Results/Tests Pending at Time of Discharge: Methylmalonic Acid  Discharge Medications:  Allergies as of 07/29/2020      Reactions   Latex Rash, Other (See Comments)   Pt states he is allergic to latex condoms   Ace Inhibitors Cough      Medication List    STOP taking these medications   metoprolol succinate 25 MG 24 hr tablet Commonly known as: TOPROL-XL   pregabalin 75 MG capsule Commonly known as: Lyrica     TAKE these medications   acetaminophen 325 MG tablet Commonly known as: TYLENOL Take 325 mg by mouth every 6 (six) hours as needed for mild pain or headache.   amLODipine 10 MG tablet Commonly known as: NORVASC Take 1 tablet (10 mg total) by mouth at bedtime.   Blood Glucose Monitor System w/Device Kit 1 kit by Does not apply route daily.   carvedilol 12.5 MG tablet Commonly known as: COREG Take 1 tablet (12.5 mg total) by mouth 2 (two) times daily with a meal.   gabapentin 100 MG capsule Commonly known as: NEURONTIN Take 1 capsule (100 mg total) by mouth 3 (three) times daily. What changed: when to take this   hydrALAZINE 25 MG tablet Commonly known as: APRESOLINE Take 1.5 tablets (37.5 mg total) by mouth 3 (three) times daily.   sevelamer carbonate 800 MG tablet Commonly known as: RENVELA Take 1 tablet (800 mg  total) by mouth 3 (  three) times daily with meals.   sodium bicarbonate 650 MG tablet Take 2 tablets (1,300 mg total) by mouth 3 (three) times daily.   sodium polystyrene 15 GM/60ML suspension Commonly known as: KAYEXALATE Take 60 mLs (15 g total) by mouth daily.   terbinafine 1 % cream Commonly known as: LAMISIL Apply 1 application topically 2 (two) times daily. For 1 week to arms       Discharge Instructions: Please refer to Patient Instructions section of EMR for full details.  Patient was counseled important signs and symptoms that should prompt return to medical care, changes in medications, dietary instructions, activity restrictions, and follow up appointments.   Follow-Up Appointments:  Follow-up Information    Wilber Oliphant, MD. Go to.   Specialty: Family Medicine Contact information: [address] [place] [address] [place], [facility] up.   Specialty: Nephrology Contact information: [address] [place] [address] [phone]               Benay Pike, MD 07/30/2020, 6:17 PM PGY-1, [place]

## 2020-07-29 NOTE — Progress Notes (Signed)
Family Medicine Teaching Service Daily Progress Note Intern Pager: [phone]  Patient name: [name] Medical record number: [id number] Date of birth: [birth year] Age: 54 y.o. Gender: male  Primary Care Provider: Wilber Oliphant, MD Consultants: Nephrology Code Status: Fll  Pt Overview and Major Events to Date:  10/20 Admitted  Assessment and Plan:  Philopateer Strine a 54 y.o.malepresenting with hypertensive emergency. PMH is significant forhypertension, CKD, type 2 diabetes.   Hypertensive emergency Continue Hydralazine to 37.5 mg TID. BP this morning is 166/86. -Continue oral Hydralazine to 37.5 mg TID -AM CBC, renal function panel, PTH, calcium, vitamin B-12, folate, ferritin   AKI onCKDstage IV Worsening disease likely secondary to diabetes and uncontrolled hypertension. Today Creatine is 6.85, BUN is 63, and Phos is 6.7. -Nephrology following, appreciate recommendations -Most likely start dialysis today -Avoid nephrotoxic agents i.e. ACE inhibitor's, NSAIDs, avoid CT with contrast or MRI with gadolinium -Dose all medications to GFR<15 -Continue bicarb tablet 1300 mg TID -I's and O's   Proteinuria Elevated urine protein creatinine ratio-13 -Nephrology following, appreciate recommendations -Follow-upSPEP w/ IF, FLC, Hep B and C panel, HIV   Hyperkalemia On admissionK 5.7,likely secondary to worsening CKD and possible RTA57from diabetes. Today K is 3.9. -Nephrology following,appreciate recommendations -Continue Lokelma 10 mg BID -Continue bicarb tablet 3007 mg TID   Metabolic acidosis,non-anion gap (resolved) Today AG is 9.   Type 2 DM, diabetic foot ulcer CBG 120on admission.Last A1c 5.2 on10/19/2021. Denies polyuria, polydipsia or blurred vision Homemeds:Glipizide 5 mg,unsure of compliance -CBGs every 4 hourly -Sensitive sign scale if needed   Anemia, macrocytic Hb 11.1, MCV 102. Pt drinks 4 units hard liquor every week.  Hgb today is 9.1. -Transfusion threshold<7 -Monitor with CBC -Follow-up folate, B12 and iron panel   Tobacco substance disorder Smokes socially, threes cigarettes a month for the last 20 years -Offernicotine patch    Peripheral neuropathy Patient follows Dr. Lennette Bihari Patel-podiatry.He was last seen in clinic on 10/15 and started on Lyrica 75 mg as gabapentin has not been controlling neuropathic pain well. Home meds:Gabapentin 100 g 3 times daily,Lyrica 75 mg once daily -Hold gabapentinas CrCl 13.3. Consider restarting with 300mg  day/in 1 dose -Hold Lyrica -Optimize glucose control    FEN/GI: Renal diet with fluid restriction 1200 cc PPx: Heparin  Disposition: progressive  Prior to Admission Living Arrangement: Home Anticipated Discharge Location: Home Barriers to Discharge: Nephrology Clearance   Anticipated discharge in approximately 0-1 day(s).   Subjective:  Interviewed patient at bedside.  He reports feeling fine today. Discussed awaiting Nephrology clearance for discharge and he reports already having a follow up appointment at [facility] on Wednesday 10/27.  Objective: Temp:  [97.8 F (36.6 C)-98.7 F (37.1 C)] 97.8 F (36.6 C) (10/23 0418) Pulse Rate:  [65-76] 65 (10/23 0418) Resp:  [18] 18 (10/23 0418) BP: (137-188)/(67-88) 166/86 (10/23 0559) SpO2:  [98 %] 98 % (10/23 0418) Weight:  [195 lb 8.8 oz (88.7 kg)] 195 lb 8.8 oz (88.7 kg) (10/23 0418) Physical Exam:  Physical Exam Vitals and nursing note reviewed.  Constitutional:      General: He is not in acute distress.    Appearance: Normal appearance. He is normal weight. He is not ill-appearing, toxic-appearing or diaphoretic.  HENT:     Head: Normocephalic and atraumatic.  Cardiovascular:     Rate and Rhythm: Normal rate and regular rhythm.     Pulses: Normal pulses.          Radial pulses are 2+ on the right side  and 2+ on the left side.       Dorsalis pedis pulses are 2+ on the  right side and 2+ on the left side.     Heart sounds: Normal heart sounds, S1 normal and S2 normal. No murmur heard.   Pulmonary:     Effort: Pulmonary effort is normal. No respiratory distress.     Breath sounds: Normal breath sounds. No wheezing.  Abdominal:     General: There is no distension.     Palpations: There is no mass.     Tenderness: There is no abdominal tenderness.  Musculoskeletal:     Right lower leg: No edema.     Left lower leg: No edema.  Neurological:     Mental Status: He is alert. Mental status is at baseline.  Psychiatric:        Mood and Affect: Mood normal.        Behavior: Behavior normal.        Thought Content: Thought content normal.      Laboratory: Recent Labs  Lab 07/26/20 1441 07/28/20 0126 07/29/20 0213  WBC 9.5 10.5 8.7  HGB 11.1* 9.2* 9.1*  HCT 35.1* 28.2* 27.4*  PLT 208 164 173   Recent Labs  Lab 07/26/20 1441 07/26/20 1441 07/27/20 0319 07/28/20 0126 07/29/20 0209  NA 141   < > 140 140 141  K 5.7*   < > 5.2* 4.0 3.9  CL 116*   < > 118* 113* 114*  CO2 15*   < > 15* 17* 18*  BUN 60*   < > 65* 67* 63*  CREATININE 7.06*   < > 6.92* 6.75* 6.84*  CALCIUM 7.9*   < > 7.7*  7.5* 7.7* 7.4*  PROT 6.4*  --   --   --   --   BILITOT 0.4  --   --   --   --   ALKPHOS 69  --   --   --   --   ALT 17  --   --   --   --   AST 18  --   --   --   --   GLUCOSE 90   < > 84 123* 80   < > = values in this interval not displayed.     Imaging/Diagnostic Tests:   US Renal:Echogenic kidneys bilaterally consistent with medical renal disease. No hydronephrosis   Briant Cedar, MD 07/29/2020, 6:14 AM PGY-1, [place] Intern pager: [phone], text pages welcome

## 2020-07-29 NOTE — Progress Notes (Signed)
[facility] Progress Note    Assessment/ Plan:   AKI on CKD4: suspecting this secondary to progression of disease versus HTN emergency/cocaine use. 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  Will likely need to start dialysis planning for the near future -will hold off on biopsy as his management currently will not change as of right now -renal US without obstruction -ua w/ sediment exam reviewed, consider urine culture -Continue to monitor daily Cr, Dose meds for GFR<15 -Monitor Daily I/Os, Daily weight  -Maintain MAP>65 for optimal renal perfusion.  -Agree with holding ACE-I, avoid further nephrotoxins including NSAIDS, Morphine.  Unless absolutely necessary, avoid CT with contrast and/or MRI with gadolinium.     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  Should continue this on discharge until repeat labs this week -bicarb supplementation  Metabolic acidosis (non-anion gap): improved. nahco3 1300mg  tid  Hypertension, better controlled: Consider echo and renal art duplex to r/o RAS -ordered home norvasc, added coreg.  Hydralazine added by primary service  CKD-MBD: check pth. Started renvela 800mg  tidac  Microscopic Hematuria -Lupus and vasculitis serologies negative  Macrocytic anemia: -Transfuse for Hgb<7 g/dL  Uncontrolled Diabetes Mellitus Type 2 :mgmt per primary service  Okay for discharge from a nephrology perspective.  Has an appointment with primary this coming Wednesday for which he should receive blood  work.  We will try to get him scheduled at our office in the next week or so.  Discussed with primary service   Subjective:   No events, patient feels well, wants to go home.  BP better   Objective:   BP (!) 151/78 (BP Location: Right Arm)   Pulse 66   Temp 97.8 F (36.6 C) (Oral)   Resp 15   Ht 5\' 11"  (1.803 m)   Wt 88.7 kg   SpO2 95%   BMI 27.27 kg/m   Intake/Output Summary (Last 24 hours) at 07/29/2020 1339 Last data filed at 07/28/2020 1800 Gross per 24 hour  Intake 480 ml  Output --  Net 480 ml   Weight change: -0.522 kg  Physical Exam: Gen:nad CVS:s1s2, rrr, no m/r/g Resp:cta bl, no w/r/r/c, unlabored, bl chest expansion ZMO:QHUT, nt/nd Ext:no edema Neuro: speech clear and coherent, moves all ext spontaneously, no asterixis  Imaging: No results found.  Labs: BMET Recent Labs  Lab 07/25/20 1559 07/26/20 1441 07/27/20 0319 07/28/20 0126 07/29/20 0209  NA 143 141 140 140 141  K 5.4* 5.7* 5.2* 4.0 3.9  CL 115* 116* 118* 113* 114*  CO2 15* 15* 15* 17* 18*  GLUCOSE 80 90 84 123* 80  BUN 63* 60* 65* 67* 63*  CREATININE 6.77* 7.06* 6.92* 6.75* 6.84*  CALCIUM 7.9* 7.9* 7.7*  7.5* 7.7* 7.4*  PHOS 7.3*  --  7.0* 6.7* 6.7*   CBC Recent Labs  Lab 07/25/20 1559 07/26/20 1441 07/28/20 0126 07/29/20 0213  WBC 9.8 9.5 10.5 8.7  NEUTROABS 6.1 6.4  --   --   HGB 10.4* 11.1* 9.2* 9.1*  HCT 30.4* 35.1* 28.2* 27.4*  MCV 96 102.0* 98.6 98.9  PLT 196 208 164 173  Medications:    . amLODipine  10 mg Oral Daily  . carvedilol  12.5 mg Oral BID WC  . heparin  5,000 Units Subcutaneous Q8H  . hydrALAZINE  37.5 mg Oral TID  . sevelamer carbonate  800 mg Oral TID WC  . sodium bicarbonate  1,300 mg Oral TID      Gean Quint, MD [facility] 07/29/2020, 1:39 PM

## 2020-07-31 ENCOUNTER — Telehealth: Payer: Self-pay

## 2020-07-31 ENCOUNTER — Telehealth: Payer: Self-pay | Admitting: Family Medicine

## 2020-07-31 LAB — METHYLMALONIC ACID, SERUM: Methylmalonic Acid, Quantitative: 284 nmol/L (ref 0–378)

## 2020-07-31 NOTE — Telephone Encounter (Signed)
Received call from after-hours line regarding patient's blood sugars.  Postprandial CBG 190.  Patient was wondering whether he should restart his glipizide as his blood sugars have gone high after his meal.  He denies symptoms of hypoglycemia.  His fasting CBGs have been 80-90s. He is keeping a CBG diary at home.  I recommended that he should not restart glipizide as it was discontinued in hospital due to hypoglycemia.  He should continue to keep a CBG diary and discuss diabetes management with Dr. Maudie Mercury at upcoming visit.   He reports his recent blood pressure was 150/90 and he is keeping a blood pressure diary.  He had a question about fluid restriction.  He asked whether he should be on a 12 ounce fluid restriction.  I said he could limit his fluid intake to 1270ml due to his kidney disease until he sees Dr. Maudie Mercury and he can clarify this with her.  Lattie Haw, MD PGY 2, [place]

## 2020-07-31 NOTE — Telephone Encounter (Signed)
Patient calls nurse line with concerns for diarrhea. Patient states he believes it is coming from Hydralazine or, "all the meds in general." Patient also states he is unsure how much water he is to be drinking. Patient reports he had water restrictions in the hospital. Patient reminded of FU with PCP on 10/27.

## 2020-08-02 ENCOUNTER — Ambulatory Visit (INDEPENDENT_AMBULATORY_CARE_PROVIDER_SITE_OTHER): Payer: 59 | Admitting: Family Medicine

## 2020-08-02 ENCOUNTER — Other Ambulatory Visit: Payer: Self-pay

## 2020-08-02 ENCOUNTER — Encounter: Payer: Self-pay | Admitting: Family Medicine

## 2020-08-02 VITALS — BP 152/74 | HR 67 | Ht 70.0 in | Wt 201.8 lb

## 2020-08-02 DIAGNOSIS — I150 Renovascular hypertension: Secondary | ICD-10-CM

## 2020-08-02 DIAGNOSIS — R197 Diarrhea, unspecified: Secondary | ICD-10-CM | POA: Insufficient documentation

## 2020-08-02 DIAGNOSIS — N184 Chronic kidney disease, stage 4 (severe): Secondary | ICD-10-CM

## 2020-08-02 MED ORDER — AMLODIPINE BESYLATE 10 MG PO TABS
10.0000 mg | ORAL_TABLET | Freq: Every day | ORAL | 3 refills | Status: DC
Start: 2020-08-02 — End: 2021-06-19

## 2020-08-02 NOTE — Assessment & Plan Note (Signed)
Blood pressures appear to be improving at home.  Per nephrology recommendations, blood pressure should not be dropped too quickly as this risks prerenal injury.  Patient did not start amlodipine after leaving the hospital.  I have instructed patient to start the amlodipine at this time.  I have sent an additional prescription to the pharmacy.  Patient has continued to have blood pressures in the 790 systolic despite continued diarrhea at home, so I am less worried about perfusion issues at this time with starting amlodipine. Should patient have any issues affording his medication, he should call the office immediately. Patient should continue to take blood pressures at home, use salt in moderation (he does report that he used to eat large jars of pickles and drink the pickle juice weekly up until recently). Patient should follow-up with [place] in 2 to 4 weeks after his visit with [facility] to address his other issues such as diabetes, hyperlipidemia.

## 2020-08-02 NOTE — Assessment & Plan Note (Addendum)
Patient's diarrhea is likely multifactorial but I have high suspicion that it is due to Renvela, which was started in the hospital in the setting of his acute renal failure (phosphorus on 10/19 was 7.3, decreased to 6.7 on 10/23).  He does not follow up with [facility] until November 10.  I do have some concern that patient can become dehydrated with other electrolyte abnormalities.  I have called the nephrologist on-call through the hospital who recommends trialing patient off of the Renvela to see if the diarrhea improves.  I have also sent a staff message Dr. Candiss Norse to let them know that this medication was taken off in preparation for his visit on November 10 and for further recommendations.  I have contacted patient to tell him to discontinue this medication.  If he is not having any improvement, he should call the office for further work-up. Other causes of diarrhea: Can also consider C. difficile, however, less likely as patient reports symptoms started at the beginning of his hospitalization after starting new medications.  Patient denies any fever, bloody bowel movements, abdominal pain which makes infection lower on the differential. Encourage patient to continue to stay hydrated as long as he is urinating well.  Patient was concerned as he was on a fluid restriction in the hospital and was not sure how much fluid to take in, especially in the setting of the diarrhea.  He does not have any history of heart failure.  On exam today, his lungs are clear and does not have any lower extremity edema.  Patient provided return precautions to the emergency department.

## 2020-08-02 NOTE — Patient Instructions (Addendum)
I sent a high blood pressure medication called amlodipine to the pharmacy.  Please add this to your current medications and continue to monitor your blood pressures daily. For the diarrhea, I am going to call over to the nephrology doctors and ask them about your medication specifically so that you do not have to wait until the 10th when you are seen in the office.  We will get some more recommendations from them.  We will also check those labs to make sure it would be safe to come off of any of those medications. As far as diet, I have provided some information for you below as well as an extra handout.  If you have any difficulty urinating, please go straight to the emergency department.  As long as you are urinating normally, you can continue to drink fluids, especially as you continue to have diarrhea.    Food Basics for Chronic Kidney Disease When your kidneys are not working well, they cannot remove waste and excess substances from your blood as effectively as they did before. This can lead to a buildup and imbalance of these substances, which can worsen kidney damage and affect how your body functions. Certain foods lead to a buildup of these substances in the body. By changing your diet as recommended by your diet and nutrition specialist (dietitian) or health care provider, you could help prevent further kidney damage and delay or prevent the need for dialysis. What are tips for following this plan? General instructions   Work with your health care provider and dietitian to develop a meal plan that is right for you. Foods you can eat, limit, or avoid will be different for each person depending on the stage of kidney disease and any other existing health conditions.  Talk with your health care provider about whether you should take a vitamin and mineral supplement.  Use standard measuring cups and spoons to measure servings of foods. Use a kitchen scale to measure portions of protein  foods.  If directed by your health care provider, avoid drinking too much fluid. Measure and count all liquids, including water, ice, soups, flavored gelatin, and frozen desserts such as popsicles or ice cream. Reading food labels  Check the amount of sodium in foods. Choose foods that have less than 300 milligrams (mg) per serving.  Check the ingredient list for phosphorus or potassium-based additives or preservatives.  Check the amount of saturated and trans fat. Limit or avoid these fats as told by your dietitian. Shopping  Avoid buying foods that are: ? Processed, frozen, or prepackaged. ? Calcium-enriched or fortified.  Do not buy foods that have salt or sodium listed among the first five ingredients.  Do not buy canned vegetables. Cooking  Replace animal proteins, such as meat, fish, eggs, or dairy, with plant proteins from beans, nuts, and soy. ? Use soy milk instead of cow's milk. ? Add beans or tofu to soups, casseroles, or pasta dishes instead of meat.  Soak vegetables, such as potatoes, before cooking to reduce potassium. To do this: ? Peel and cut into small pieces. ? Soak in warm water for at least 2 hours. For every 1 cup of vegetables, use 10 cups of water. ? Drain and rinse with warm water. ? Boil for at least 5 minutes. Meal planning  Limit the amount of protein from plant and animal sources you eat each day.  Do not add salt to food when cooking or before eating.  Eat meals and snacks at  around the same time each day. If you have diabetes:  If you have diabetes (diabetes mellitus) and chronic kidney disease, it is important to keep your blood glucose in the target range recommended by your health care provider. Follow your diabetes management plan. This may include: ? Checking your blood glucose regularly. ? Taking oral medicines, insulin, or both. ? Exercising for at least 30 minutes on 5 or more days each week, or as told by your health care  provider. ? Tracking how many servings of carbohydrates you eat at each meal.  You may be given specific guidelines on how much of certain foods and nutrients you may eat, depending on your stage of kidney disease and whether you have high blood pressure (hypertension). Follow your meal plan as told by your dietitian. What nutrients should be limited? The items listed are not a complete list. Talk with your dietitian about what dietary choices are best for you. Potassium Potassium affects how steadily your heart beats. If too much potassium builds up in your blood, it can cause an irregular heartbeat or even a heart attack. You may need to eat less potassium, depending on your blood potassium levels and the stage of kidney disease. Talk to your dietitian about how much potassium you may have each day. You may need to limit or avoid foods that are high in potassium, such as:  Milk and soy milk.  Fruits, such as bananas, papaya, apricots, nectarines, melon, prunes, raisins, kiwi, and oranges.  Vegetables, such as potatoes, sweet potatoes, yams, tomatoes, leafy greens, beets, okra, avocado, pumpkin, and winter squash.  White and lima beans. Phosphorus Phosphorus is a mineral found in your bones. A balance between calcium and phosphorous is needed to build and maintain healthy bones. Too much phosphorus pulls calcium from your bones. This can make your bones weak and more likely to break. Too much phosphorus can also make your skin itch. You may need to eat less phosphorus depending on your blood phosphorus levels and the stage of kidney disease. Talk to your dietitian about how much potassium you may have each day. You may need to take medicine to lower your blood phosphorus levels if diet changes do not help. You may need to limit or avoid foods that are high in phosphorus, such as:  Milk and dairy products.  Dried beans and peas.  Tofu, soy milk, and other soy-based meat  replacements.  Colas.  Nuts and peanut butter.  Meat, poultry, and fish.  Bran cereals and oatmeals. Protein Protein helps you to make and keep muscle. It also helps in the repair of your body's cells and tissues. One of the natural breakdown products of protein is a waste product called urea. When your kidneys are not working properly, they cannot remove wastes, such as urea, like they did before you developed chronic kidney disease. Reducing how much protein you eat can help prevent a buildup of urea in your blood. Depending on your stage of kidney disease, you may need to limit foods that are high in protein. Sources of animal protein include:  Meat (all types).  Fish and seafood.  Poultry.  Eggs.  Dairy. Other protein foods include:  Beans and legumes.  Nuts and nut butter.  Soy and tofu. Sodium Sodium, which is found in salt, helps maintain a healthy balance of fluids in your body. Too much sodium can increase your blood pressure and have a negative effect on the function of your heart and lungs. Too much  sodium can also cause your body to retain too much fluid, making your kidneys work harder. Most people should have less than 2,300 milligrams (mg) of sodium each day. If you have hypertension, you may need to limit your sodium to 1,500 mg each day. Talk to your dietitian about how much sodium you may have each day. You may need to limit or avoid foods that are high in sodium, such as:  Salt seasonings.  Soy sauce.  Cured and processed meats.  Salted crackers and snack foods.  Fast food.  Canned soups and most canned foods.  Pickled foods.  Vegetable juice.  Boxed mixes or ready-to-eat boxed meals and side dishes.  Bottled dressings, sauces, and marinades. Summary  Chronic kidney disease can lead to a buildup and imbalance of waste and excess substances in the body. Certain foods lead to a buildup of these substances. By adjusting your intake of these  foods, you could help prevent more kidney damage and delay or prevent the need for dialysis.  Food adjustments are different for each person with chronic kidney disease. Work with a dietitian to set up nutrient goals and a meal plan that is right for you.  If you have diabetes and chronic kidney disease, it is important to keep your blood glucose in the target range recommended by your health care provider. This information is not intended to replace advice given to you by your health care provider. Make sure you discuss any questions you have with your health care provider. Document Revised: 01/14/2019 Document Reviewed: 09/18/2016 Elsevier Patient Education  2020 [employer].

## 2020-08-02 NOTE — Progress Notes (Signed)
SUBJECTIVE:  CHIEF COMPLAINT / HPI:   1. CKD (chronic kidney disease) stage 4, GFR 15-29 ml/min [name]) Patient recently hospitalized for acute renal failure in the setting of poorly controlled hypertension.  He was seen by Dr. Candiss Salinas in the hospital when he arrived to the emergency department.  Patient was placed on Renvela, sodium bicarbonate, Kayexalate.  Patient reports that he was unable to afford the Kayexalate.  He has a follow-up with [facility] on November 10.  2.  Hypertension Patient reports that his blood pressures have been much improved since leaving the hospital.  He has recorded his blood pressures and they have been in the 140s to 150s over 70s to 90s.  Prior to hospitalization, patient reports blood pressures were in the 200s.  Patient does report compliance with his medications.  He has been taking carvedilol and hydralazine at home.  He was also prescribed amlodipine, however, did not know that he was supposed to be taking this medication.  He does report some issues with medication cost.  I have double check patient's formulary today and it appears they do cover amlodipine.  3.  Diarrhea Patient reports that he has had multiple episodes of diarrhea since being in the hospital.  He believes that his medication has been causing his diarrhea.  He reports that he has been feeling very tired at home due to persistent diarrhea.  He does report being compliant with his medications.  He reports that his stools are pale and watery.  He does not have any bloody bowel movements or any abdominal pain with his bowel movements.  PERTINENT  PMH / PSH: Type 2 diabetes, CKD, hyperlipidemia  OBJECTIVE:  BP (!) 152/74   Pulse 67   Ht 5\' 10"  (1.778 m)   Wt 201 lb 12.8 oz (91.5 kg)   SpO2 97%   BMI 28.96 kg/m   General: Well-appearing male, no acute distress, nontoxic.  Appears fatigued Chest: Regular rate and rhythm, no M/R/G. Lungs: Clear to auscultation bilaterally.  No  wheezing or rales appreciated MSK: No bilateral lower extremity edema.  ASSESSMENT/PLAN:  Diarrhea Patient's diarrhea is likely multifactorial but I have high suspicion that it is due to Renvela, which was started in the hospital in the setting of his acute renal failure (phosphorus on 10/19 was 7.3, decreased to 6.7 on 10/23).  He does not follow up with [facility] until November 10.  I do have some concern that patient can become dehydrated with other electrolyte abnormalities.  I have called the nephrologist on-call through the hospital who recommends trialing patient off of the Renvela to see if the diarrhea improves.  I have also sent a staff message Dr. Candiss Salinas to let them know that this medication was taken off in preparation for his visit on November 10 and for further recommendations.  I have contacted patient to tell him to discontinue this medication.  If he is not having any improvement, he should call the office for further work-up. Other causes of diarrhea: Can also consider C. difficile, however, less likely as patient reports symptoms started at the beginning of his hospitalization after starting new medications.  Patient denies any fever, bloody bowel movements, abdominal pain which makes infection lower on the differential. Encourage patient to continue to stay hydrated as long as he is urinating well.  Patient was concerned as he was on a fluid restriction in the hospital and was not sure how much fluid to take in, especially in  the setting of the diarrhea.  He does not have any history of heart failure.  On exam today, his lungs are clear and does not have any lower extremity edema.  Patient provided return precautions to the emergency department.  Hypertension Blood pressures appear to be improving at home.  Per nephrology recommendations, blood pressure should not be dropped too quickly as this risks prerenal injury.  Patient did not start amlodipine after leaving the  hospital.  I have instructed patient to start the amlodipine at this time.  I have sent an additional prescription to the pharmacy.  Patient has continued to have blood pressures in the 338 systolic despite continued diarrhea at home, so I am less worried about perfusion issues at this time with starting amlodipine. Should patient have any issues affording his medication, he should call the office immediately. Patient should continue to take blood pressures at home, use salt in moderation (he does report that he used to eat large jars of pickles and drink the pickle juice weekly up until recently). Patient should follow-up with [place] in 2 to 4 weeks after his visit with [facility] to address his other issues such as diabetes, hyperlipidemia.   CKD (chronic kidney disease) stage 4, GFR 15-29 ml/min (HCC) Instructed patient to continue taking his medications until further notice.  He was not able to afford his Kayexalate.  We will check renal function panel today.  We will look forward to seeing consultation notes from [facility].  Patient has appointment on November 10 with Dr. Candiss Salinas.   Earl Oliphant, MD [place]

## 2020-08-02 NOTE — Assessment & Plan Note (Signed)
Instructed patient to continue taking his medications until further notice.  He was not able to afford his Kayexalate.  We will check renal function panel today.  We will look forward to seeing consultation notes from [facility].  Patient has appointment on November 10 with Dr. Candiss Norse.

## 2020-08-03 ENCOUNTER — Telehealth: Payer: Self-pay

## 2020-08-03 ENCOUNTER — Other Ambulatory Visit: Payer: Self-pay

## 2020-08-03 ENCOUNTER — Encounter (HOSPITAL_COMMUNITY): Payer: Self-pay | Admitting: Emergency Medicine

## 2020-08-03 ENCOUNTER — Inpatient Hospital Stay (HOSPITAL_COMMUNITY)
Admission: EM | Admit: 2020-08-03 | Discharge: 2020-08-05 | DRG: 639 | Disposition: A | Discharge status: Home / Self Care | Payer: 59 | Attending: Family Medicine | Admitting: Family Medicine

## 2020-08-03 DIAGNOSIS — E1122 Type 2 diabetes mellitus with diabetic chronic kidney disease: Secondary | ICD-10-CM

## 2020-08-03 DIAGNOSIS — E11621 Type 2 diabetes mellitus with foot ulcer: Secondary | ICD-10-CM | POA: Diagnosis present

## 2020-08-03 DIAGNOSIS — R197 Diarrhea, unspecified: Secondary | ICD-10-CM | POA: Diagnosis present

## 2020-08-03 DIAGNOSIS — E785 Hyperlipidemia, unspecified: Secondary | ICD-10-CM | POA: Diagnosis present

## 2020-08-03 DIAGNOSIS — Z20822 Contact with and (suspected) exposure to covid-19: Secondary | ICD-10-CM | POA: Diagnosis present

## 2020-08-03 DIAGNOSIS — Z7984 Long term (current) use of oral hypoglycemic drugs: Secondary | ICD-10-CM

## 2020-08-03 DIAGNOSIS — N184 Chronic kidney disease, stage 4 (severe): Secondary | ICD-10-CM | POA: Diagnosis present

## 2020-08-03 DIAGNOSIS — I129 Hypertensive chronic kidney disease with stage 1 through stage 4 chronic kidney disease, or unspecified chronic kidney disease: Secondary | ICD-10-CM | POA: Diagnosis present

## 2020-08-03 DIAGNOSIS — E162 Hypoglycemia, unspecified: Secondary | ICD-10-CM | POA: Diagnosis not present

## 2020-08-03 DIAGNOSIS — Z91040 Latex allergy status: Secondary | ICD-10-CM

## 2020-08-03 DIAGNOSIS — Z83438 Family history of other disorder of lipoprotein metabolism and other lipidemia: Secondary | ICD-10-CM

## 2020-08-03 DIAGNOSIS — Z833 Family history of diabetes mellitus: Secondary | ICD-10-CM

## 2020-08-03 DIAGNOSIS — E1142 Type 2 diabetes mellitus with diabetic polyneuropathy: Secondary | ICD-10-CM | POA: Diagnosis present

## 2020-08-03 DIAGNOSIS — D631 Anemia in chronic kidney disease: Secondary | ICD-10-CM | POA: Diagnosis present

## 2020-08-03 DIAGNOSIS — Z8249 Family history of ischemic heart disease and other diseases of the circulatory system: Secondary | ICD-10-CM

## 2020-08-03 DIAGNOSIS — E1169 Type 2 diabetes mellitus with other specified complication: Secondary | ICD-10-CM | POA: Diagnosis present

## 2020-08-03 DIAGNOSIS — F17210 Nicotine dependence, cigarettes, uncomplicated: Secondary | ICD-10-CM | POA: Diagnosis present

## 2020-08-03 DIAGNOSIS — N185 Chronic kidney disease, stage 5: Secondary | ICD-10-CM

## 2020-08-03 DIAGNOSIS — E781 Pure hyperglyceridemia: Secondary | ICD-10-CM | POA: Diagnosis present

## 2020-08-03 DIAGNOSIS — D539 Nutritional anemia, unspecified: Secondary | ICD-10-CM | POA: Diagnosis present

## 2020-08-03 DIAGNOSIS — E11649 Type 2 diabetes mellitus with hypoglycemia without coma: Principal | ICD-10-CM | POA: Diagnosis present

## 2020-08-03 DIAGNOSIS — Z79899 Other long term (current) drug therapy: Secondary | ICD-10-CM

## 2020-08-03 DIAGNOSIS — Z888 Allergy status to other drugs, medicaments and biological substances status: Secondary | ICD-10-CM

## 2020-08-03 DIAGNOSIS — L97509 Non-pressure chronic ulcer of other part of unspecified foot with unspecified severity: Secondary | ICD-10-CM | POA: Diagnosis present

## 2020-08-03 LAB — COMPREHENSIVE METABOLIC PANEL
ALT: 18 U/L (ref 0–44)
AST: 18 U/L (ref 15–41)
Albumin: 2.5 g/dL — ABNORMAL LOW (ref 3.5–5.0)
Alkaline Phosphatase: 90 U/L (ref 38–126)
Anion gap: 14 (ref 5–15)
BUN: 75 mg/dL — ABNORMAL HIGH (ref 6–20)
CO2: 14 mmol/L — ABNORMAL LOW (ref 22–32)
Calcium: 7.6 mg/dL — ABNORMAL LOW (ref 8.9–10.3)
Chloride: 110 mmol/L (ref 98–111)
Creatinine, Ser: 7.16 mg/dL — ABNORMAL HIGH (ref 0.61–1.24)
GFR, Estimated: 9 mL/min — ABNORMAL LOW (ref >60)
Glucose, Bld: 58 mg/dL — ABNORMAL LOW (ref 70–99)
Potassium: 3.9 mmol/L (ref 3.5–5.1)
Sodium: 138 mmol/L (ref 135–145)
Total Bilirubin: 0.4 mg/dL (ref 0.3–1.2)
Total Protein: 5.9 g/dL — ABNORMAL LOW (ref 6.5–8.1)

## 2020-08-03 LAB — RENAL FUNCTION PANEL
Albumin: 3.3 g/dL — ABNORMAL LOW (ref 3.8–4.9)
BUN/Creatinine Ratio: 10 (ref 9–20)
BUN: 70 mg/dL — ABNORMAL HIGH (ref 6–24)
CO2: 18 mmol/L — ABNORMAL LOW (ref 20–29)
Calcium: 7.9 mg/dL — ABNORMAL LOW (ref 8.7–10.2)
Chloride: 107 mmol/L — ABNORMAL HIGH (ref 96–106)
Creatinine, Ser: 7.07 mg/dL — ABNORMAL HIGH (ref 0.76–1.27)
GFR calc Af Amer: 9 mL/min/{1.73_m2} — ABNORMAL LOW (ref >59)
GFR calc non Af Amer: 8 mL/min/{1.73_m2} — ABNORMAL LOW (ref >59)
Glucose: 100 mg/dL — ABNORMAL HIGH (ref 65–99)
Phosphorus: 7.1 mg/dL — ABNORMAL HIGH (ref 2.8–4.1)
Potassium: 4.8 mmol/L (ref 3.5–5.2)
Sodium: 140 mmol/L (ref 134–144)

## 2020-08-03 LAB — CBC WITH DIFFERENTIAL/PLATELET
Abs Immature Granulocytes: 0.02 10*3/uL (ref 0.00–0.07)
Basophils Absolute: 0.1 10*3/uL (ref 0.0–0.1)
Basophils Relative: 1 %
Eosinophils Absolute: 0.3 10*3/uL (ref 0.0–0.5)
Eosinophils Relative: 3 %
HCT: 29 % — ABNORMAL LOW (ref 39.0–52.0)
Hemoglobin: 9.1 g/dL — ABNORMAL LOW (ref 13.0–17.0)
Immature Granulocytes: 0 %
Lymphocytes Relative: 9 %
Lymphs Abs: 0.9 10*3/uL (ref 0.7–4.0)
MCH: 32.4 pg (ref 26.0–34.0)
MCHC: 31.4 g/dL (ref 30.0–36.0)
MCV: 103.2 fL — ABNORMAL HIGH (ref 80.0–100.0)
Monocytes Absolute: 1 10*3/uL (ref 0.1–1.0)
Monocytes Relative: 10 %
Neutro Abs: 8.2 10*3/uL — ABNORMAL HIGH (ref 1.7–7.7)
Neutrophils Relative %: 77 %
Platelets: 157 10*3/uL (ref 150–400)
RBC: 2.81 MIL/uL — ABNORMAL LOW (ref 4.22–5.81)
RDW: 13.5 % (ref 11.5–15.5)
WBC: 10.6 10*3/uL — ABNORMAL HIGH (ref 4.0–10.5)
nRBC: 0 % (ref 0.0–0.2)

## 2020-08-03 LAB — CBG MONITORING, ED
Glucose-Capillary: 45 mg/dL — ABNORMAL LOW (ref 70–99)
Glucose-Capillary: 216 mg/dL — ABNORMAL HIGH (ref 70–99)
Glucose-Capillary: 89 mg/dL (ref 70–99)
Glucose-Capillary: 138 mg/dL — ABNORMAL HIGH (ref 70–99)
Glucose-Capillary: 38 mg/dL — CL (ref 70–99)
Glucose-Capillary: 36 mg/dL — CL (ref 70–99)
Glucose-Capillary: 48 mg/dL — ABNORMAL LOW (ref 70–99)
Glucose-Capillary: 69 mg/dL — ABNORMAL LOW (ref 70–99)
Glucose-Capillary: 69 mg/dL — ABNORMAL LOW (ref 70–99)
Glucose-Capillary: 59 mg/dL — ABNORMAL LOW (ref 70–99)
Glucose-Capillary: 73 mg/dL (ref 70–99)
Glucose-Capillary: 76 mg/dL (ref 70–99)
Glucose-Capillary: 67 mg/dL — ABNORMAL LOW (ref 70–99)
Glucose-Capillary: 92 mg/dL (ref 70–99)

## 2020-08-03 LAB — I-STAT CHEM 8, ED
BUN: 76 mg/dL — ABNORMAL HIGH (ref 6–20)
Calcium, Ion: 1.04 mmol/L — ABNORMAL LOW (ref 1.15–1.40)
Chloride: 110 mmol/L (ref 98–111)
Creatinine, Ser: 7.7 mg/dL — ABNORMAL HIGH (ref 0.61–1.24)
Glucose, Bld: 53 mg/dL — ABNORMAL LOW (ref 70–99)
HCT: 25 % — ABNORMAL LOW (ref 39.0–52.0)
Hemoglobin: 8.5 g/dL — ABNORMAL LOW (ref 13.0–17.0)
Potassium: 3.9 mmol/L (ref 3.5–5.1)
Sodium: 139 mmol/L (ref 135–145)
TCO2: 18 mmol/L — ABNORMAL LOW (ref 22–32)

## 2020-08-03 LAB — RESPIRATORY PANEL BY RT PCR (FLU A&B, COVID)
Influenza A by PCR: NEGATIVE
Influenza B by PCR: NEGATIVE
SARS Coronavirus 2 by RT PCR: NEGATIVE

## 2020-08-03 MED ORDER — DEXTROSE 10 % IV SOLN: INTRAVENOUS | Status: DC

## 2020-08-03 MED ORDER — TRIAMCINOLONE ACETONIDE 0.025 % EX CREA
TOPICAL_CREAM | Freq: Two times a day (BID) | CUTANEOUS | Status: DC
  Filled 2020-08-03: qty 15

## 2020-08-03 MED ORDER — AMLODIPINE BESYLATE 10 MG PO TABS
10.0000 mg | ORAL_TABLET | Freq: Every day | ORAL | Status: DC
  Administered 2020-08-03 – 2020-08-04 (×2): 10 mg via ORAL
  Filled 2020-08-03: qty 2
  Filled 2020-08-03: qty 1

## 2020-08-03 MED ORDER — DEXTROSE 50 % IV SOLN
INTRAVENOUS | Status: AC
  Filled 2020-08-03: qty 50

## 2020-08-03 MED ORDER — SEVELAMER CARBONATE 800 MG PO TABS
800.0000 mg | ORAL_TABLET | Freq: Three times a day (TID) | ORAL | Status: DC
  Filled 2020-08-03: qty 1

## 2020-08-03 MED ORDER — SODIUM BICARBONATE 650 MG PO TABS
1300.0000 mg | ORAL_TABLET | Freq: Three times a day (TID) | ORAL | Status: DC
  Administered 2020-08-04 – 2020-08-05 (×4): 1300 mg via ORAL
  Filled 2020-08-03 (×7): qty 2

## 2020-08-03 MED ORDER — HYDROXYZINE HCL 10 MG PO TABS: 10.0000 mg | ORAL_TABLET | Freq: Three times a day (TID) | ORAL | Status: DC | PRN

## 2020-08-03 MED ORDER — DEXTROSE 50 % IV SOLN
1.0000 | INTRAVENOUS | Status: DC | PRN
  Administered 2020-08-04 (×2): 50 mL via INTRAVENOUS
  Filled 2020-08-03 (×2): qty 50

## 2020-08-03 MED ORDER — HEPARIN SODIUM (PORCINE) 5000 UNIT/ML IJ SOLN
5000.0000 [IU] | Freq: Three times a day (TID) | INTRAMUSCULAR | Status: DC
  Administered 2020-08-03 – 2020-08-04 (×3): 5000 [IU] via SUBCUTANEOUS
  Filled 2020-08-03 (×5): qty 1

## 2020-08-03 MED ORDER — DEXTROSE 50 % IV SOLN
1.0000 | Freq: Once | INTRAVENOUS | Status: AC
  Administered 2020-08-03: 50 mL via INTRAVENOUS
  Filled 2020-08-03: qty 50

## 2020-08-03 MED ORDER — DEXTROSE 50 % IV SOLN: 1.0000 | Freq: Once | INTRAVENOUS | Status: AC

## 2020-08-03 MED ORDER — CARVEDILOL 12.5 MG PO TABS
12.5000 mg | ORAL_TABLET | Freq: Two times a day (BID) | ORAL | Status: DC
  Administered 2020-08-04 – 2020-08-05 (×3): 12.5 mg via ORAL
  Filled 2020-08-03: qty 4
  Filled 2020-08-03 (×2): qty 1

## 2020-08-03 MED ORDER — DEXTROSE 50 % IV SOLN
INTRAVENOUS | Status: AC
  Administered 2020-08-03: 50 mL via INTRAVENOUS
  Filled 2020-08-03: qty 50

## 2020-08-03 MED ORDER — HYDRALAZINE HCL 25 MG PO TABS
37.5000 mg | ORAL_TABLET | Freq: Three times a day (TID) | ORAL | Status: DC
  Administered 2020-08-03 – 2020-08-05 (×5): 37.5 mg via ORAL
  Filled 2020-08-03 (×6): qty 2

## 2020-08-03 NOTE — ED Notes (Signed)
Pt given another 8oz OJ. AOx4, continues to c/o "feeling really weird."  Wife at bedside.

## 2020-08-03 NOTE — Chronic Care Management (AMB) (Signed)
  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 I reached out to [name] by phone today in response to a referral sent by [name]'s PCP Zettie Cooley MD.    [name] was given information about care management services today including:  1. Care management services include personalized support from designated clinical staff supervised by his physician, including individualized plan of care and coordination with other care providers 2. 24/7 contact phone numbers for assistance for urgent and routine care needs. 3. The patient may stop care management services at any time by phone call to the office staff.  Patient agreed to services and verbal consent obtained.   Follow up plan: Telephone appointment with care management team member scheduled for: 08/09/20  [place] [facility]  Direct Dial: [phone]

## 2020-08-03 NOTE — ED Provider Notes (Signed)
[facility] EMERGENCY DEPARTMENT Provider Note  CSN: [id number] Arrival date & time: 08/03/20 1519    History Chief Complaint  Patient presents with  . Hypoglycemia    HPI  [name] is a 54 y.o. male with history of CKD and HTN recently admitted for AKI, did not need dialysis and labs stabilized after a few days. Discharged home a few days ago, had his DM meds stopped during his admission. He has been taking his medications as prescribed including well controlled BP at home. Last night his blood sugar was 190 and he 'freaked out' and took one of his left over Glipizide. Since then he has been unable to keep his glucose up, has been running in 30-40 range despite eating carbs and soda at home. Reports some general weakness and confusion.    Past Medical History:  Diagnosis Date  . Allergy   . CKD (chronic kidney disease), stage IV ([facility])   . Diabetes mellitus    Type 2 DM, Patient reports DM Type 1 diagnosd age at age 54  . Diabetic foot ulcers ([place])   . Foreign body in left foot    with  infection  . Hypertension   . Meniscus tear   . Multiple rib fractures   . Personal history of diabetic foot ulcer, Left Foot     Past Surgical History:  Procedure Laterality Date  . EYE SURGERY    . I & D EXTREMITY  04/23/2012   Procedure: IRRIGATION AND DEBRIDEMENT EXTREMITY;  Surgeon: Newt Minion, MD;  Location: [facility];  Service: Orthopedics;  Laterality: Left;  Irrigation and Debridement Left Foot  . I & D EXTREMITY  09/18/2012   Procedure: IRRIGATION AND DEBRIDEMENT EXTREMITY;  Surgeon: Newt Minion, MD;  Location: [facility];  Service: Orthopedics;  Laterality: Left;  . MENISCUS REPAIR Left 04/2016   Orthopedist, Dr [name] in [place], [place]    Family History  Problem Relation Age of Onset  . Diabetes type II Mother   . Arthritis Mother   . Diabetes Mother   . Hyperlipidemia Mother   . Hypertension Mother   . Diabetes type II Sister   . Diabetes Father     Social  History   Tobacco Use  . Smoking status: Former Smoker    Types: Cigarettes    Quit date: 09/03/2012    Years since quitting: 7.9  . Smokeless tobacco: Former Systems developer    Quit date: 09/03/2012  Substance Use Topics  . Alcohol use: Yes    Alcohol/week: 8.0 standard drinks    Types: 6 Cans of beer, 2 Shots of liquor per week    Comment: weekends.  . Drug use: No     Home Medications Prior to Admission medications   Medication Sig Start Date End Date Taking? Authorizing Provider  acetaminophen (TYLENOL) 325 MG tablet Take 325 mg by mouth every 6 (six) hours as needed for mild pain or headache.   Yes [provider]  carvedilol (COREG) 12.5 MG tablet Take 1 tablet (12.5 mg total) by mouth 2 (two) times daily with a meal. 07/29/20 08/28/20 Yes Benay Pike, MD  gabapentin (NEURONTIN) 100 MG capsule Take 1 capsule (100 mg total) by mouth 3 (three) times daily. Patient taking differently: Take 100 mg by mouth at bedtime.  03/22/20  Yes Felipa Furnace, DPM  hydrALAZINE (APRESOLINE) 25 MG tablet Take 1.5 tablets (37.5 mg total) by mouth 3 (three) times daily. 07/29/20 08/28/20 Yes Benay Pike,  MD  sodium bicarbonate 650 MG tablet Take 2 tablets (1,300 mg total) by mouth 3 (three) times daily. 07/29/20 08/28/20 Yes Benay Pike, MD  amLODipine (NORVASC) 10 MG tablet Take 1 tablet (10 mg total) by mouth at bedtime. 08/02/20   Wilber Oliphant, MD  Blood Glucose Monitoring Suppl (BLOOD GLUCOSE MONITOR SYSTEM) w/Device KIT 1 kit by Does not apply route daily. 02/22/20   Wilber Oliphant, MD  sevelamer carbonate (RENVELA) 800 MG tablet Take 1 tablet (800 mg total) by mouth 3 (three) times daily with meals. 07/29/20 08/28/20  Benay Pike, MD  terbinafine (LAMISIL) 1 % cream Apply 1 application topically 2 (two) times daily. For 1 week to arms 07/25/20   Martyn Malay, MD     Allergies    Latex and Ace inhibitors   Review of Systems   Review of Systems A comprehensive review of  systems was completed and negative except as noted in HPI.    Physical Exam BP (!) 173/81   Pulse 71   Temp 97.7 F (36.5 C) (Oral)   Resp 16   SpO2 97%   Physical Exam Vitals and nursing note reviewed.  Constitutional:      Appearance: Normal appearance.  HENT:     Head: Normocephalic and atraumatic.     Nose: Nose normal.     Mouth/Throat:     Mouth: Mucous membranes are moist.  Eyes:     Extraocular Movements: Extraocular movements intact.     Conjunctiva/sclera: Conjunctivae normal.  Cardiovascular:     Rate and Rhythm: Normal rate.  Pulmonary:     Effort: Pulmonary effort is normal.     Breath sounds: Normal breath sounds.  Abdominal:     General: Abdomen is flat.     Palpations: Abdomen is soft.     Tenderness: There is no abdominal tenderness.  Musculoskeletal:        General: No swelling. Normal range of motion.     Cervical back: Neck supple.  Skin:    General: Skin is warm and dry.  Neurological:     General: No focal deficit present.     Mental Status: He is alert.  Psychiatric:        Mood and Affect: Mood normal.      ED Results / Procedures / Treatments   Labs (all labs ordered are listed, but only abnormal results are displayed) Labs Reviewed  CBC WITH DIFFERENTIAL/PLATELET - Abnormal; Notable for the following components:      Result Value   WBC 10.6 (*)    RBC 2.81 (*)    Hemoglobin 9.1 (*)    HCT 29.0 (*)    MCV 103.2 (*)    Neutro Abs 8.2 (*)    All other components within normal limits  COMPREHENSIVE METABOLIC PANEL - Abnormal; Notable for the following components:   CO2 14 (*)    Glucose, Bld 58 (*)    BUN 75 (*)    Creatinine, Ser 7.16 (*)    Calcium 7.6 (*)    Total Protein 5.9 (*)    Albumin 2.5 (*)    GFR, Estimated 9 (*)    All other components within normal limits  CBG MONITORING, ED - Abnormal; Notable for the following components:   Glucose-Capillary 45 (*)    All other components within normal limits  I-STAT CHEM 8,  ED - Abnormal; Notable for the following components:   BUN 76 (*)    Creatinine, Ser 7.70 (*)  Glucose, Bld 53 (*)    Calcium, Ion 1.04 (*)    TCO2 18 (*)    Hemoglobin 8.5 (*)    HCT 25.0 (*)    All other components within normal limits  CBG MONITORING, ED - Abnormal; Notable for the following components:   Glucose-Capillary 216 (*)    All other components within normal limits  CBG MONITORING, ED - Abnormal; Notable for the following components:   Glucose-Capillary 38 (*)    All other components within normal limits  CBG MONITORING, ED - Abnormal; Notable for the following components:   Glucose-Capillary 138 (*)    All other components within normal limits  CBG MONITORING, ED - Abnormal; Notable for the following components:   Glucose-Capillary 36 (*)    All other components within normal limits  CBG MONITORING, ED - Abnormal; Notable for the following components:   Glucose-Capillary 48 (*)    All other components within normal limits  RESPIRATORY PANEL BY RT PCR (FLU A&B, COVID)  BASIC METABOLIC PANEL  CBC  CBG MONITORING, ED    EKG None  Radiology No results found.  Procedures .Critical Care Performed by: Truddie Hidden, MD Authorized by: Truddie Hidden, MD   Critical care provider statement:    Critical care time (minutes):  40   Critical care time was exclusive of:  Separately billable procedures and treating other patients   Critical care was necessary to treat or prevent imminent or life-threatening deterioration of the following conditions:  Endocrine crisis   Critical care was time spent personally by me on the following activities:  Discussions with consultants, evaluation of patient's response to treatment, examination of patient, ordering and performing treatments and interventions, ordering and review of laboratory studies, ordering and review of radiographic studies, pulse oximetry, re-evaluation of patient's condition, obtaining history from  patient or surrogate and review of old charts    Medications Ordered in the ED Medications  dextrose 10 % infusion ( Intravenous New Bag/Given 08/03/20 1750)  amLODipine (NORVASC) tablet 10 mg (has no administration in time range)  carvedilol (COREG) tablet 12.5 mg (has no administration in time range)  hydrALAZINE (APRESOLINE) tablet 37.5 mg (has no administration in time range)  sevelamer carbonate (RENVELA) tablet 800 mg (has no administration in time range)  sodium bicarbonate tablet 1,300 mg (has no administration in time range)  heparin injection 5,000 Units (has no administration in time range)  dextrose 50 % solution (has no administration in time range)  dextrose 50 % solution (  Given 08/03/20 1530)  dextrose 50 % solution 50 mL (50 mLs Intravenous Given 08/03/20 1745)     MDM Rules/Calculators/A&P MDM Patient given D50 prior to my eval with good improvement in glucose. Will check labs including chemistry and CBC, monitor in the ED for recurrence of hypoglycemia.  ED Course  I have reviewed the triage vital signs and the nursing notes.  Pertinent labs & imaging results that were available during my care of the patient were reviewed by me and considered in my medical decision making (see chart for details).  Clinical Course as of Aug 03 1948  Thu Aug 03, 2020  1556 I-stat chem reviewed, consistent with history of CKD and hypoglycemia.    [CS]  9767 CBC and CMP at baseline. CBG trending down, but patient not currently symptomatic and still in normal range. Will continue to monitor closely.    [CS]  1731 Glucose has dropped back to 30s. Still awake and alert. Will give  additional D50 and start on D10 drip. Plan admission for monitoring.    [CS]  3870 Spoke with FM Resident who will evaluate for admission.    [CS]    Clinical Course User Index [CS] Truddie Hidden, MD    Final Clinical Impression(s) / ED Diagnoses Final diagnoses:  Hypoglycemia    Rx / DC  Orders ED Discharge Orders    None       Truddie Hidden, MD 08/03/20 1949

## 2020-08-03 NOTE — Telephone Encounter (Signed)
Patient calls nurse line regarding hypoglycemic episodes. Patient is unable to talk about condition on the phone due to confusion. Spoke with patient's wife, [name] who reports blood sugar dropping down to 30's throughout the day today. Patient reports that his blood sugar was elevated last night, therefore, he took the glipizide. Of note, medication was discontinued due to hypoglycemic events.   Wife reports continued confusion after administration of carbohydrates. This has been going on since about 11am today. States that patient is also having difficulty walking.   Due to symptoms and inability to control blood sugar, advised ED visit. Wife verbalizes understanding and will take patient to ED for further evaluation.   Talbot Grumbling, RN

## 2020-08-03 NOTE — H&P (Addendum)
[facility] Admission History and Physical Service Pager: [phone]  Patient name: [name] Medical record number: [id number] Date of birth: [birth year] Age: 54 y.o. Gender: male  Primary Care Provider: Wilber Oliphant, MD Consultants: none Code Status: FULL  Chief Complaint: hypoglycemia  Assessment and Plan: [name] is a 54 y.o. male presenting with medication-induced hypoglycemia. PMH is significant for HTN, CKD, T2DM. Recently admitted for AKI on CKD.  Hypoglycemia Patient presented with symptoms of lip tingling and brain fogginess secondary to hypoglycemia (as low as 30s at home) in the setting of taking 1 old glipizide tablet (5 mg, extended release) last night.  Patient was recently discharged from the hospital and all antihyperglycemics were discontinued.  In the ED, he presented with glucose of 45 and received 1 amp of dextrose with adequate response; however, his glucose continued to drop to 38 over the next 2 hours, so he was given another amp of dextrose and was started on D10 infusion.  He likely still has glipizide in his circulation given it was a extended release formulation. Given his low creatinine clearance drug will likely persist for the next 24-48 hours.  Admitted for treatment of hypoglycemia and observation. - admit to FPTS, med-tele, Dr. Ardelia Mems attending - continue D10, adjust as necessary - CBG monitoring q2h, can decrease to q4h if two consecutive CBG > 100 - vitals per unit routine - up as tolerated  T2DM With diabetic foot ulcer. Last A1c 5.2 on 07/25/2020. Antihyperglycemics were discontinued after recent hospital admission. However, pt took leftover Glipizide as he was afraid that his blood sugars where too high. Pt gave FPTS the bottle of Glipizide 5 mg XR which was given to the pharmacist.   HTN BP 153/74 on admission. States he took his medications this morning (Coreg and Hydral). States he hasn't picked up  his amlodipine from the pharmacy. Per nephrology recommendations, blood pressure should not be dropped too quickly as this risks prerenal injury.  Home meds: carvedilol 12.5 mg BID, amlodipine 10 mg, hydralazine 37.5 mg TID.  - continue home meds  Macrocytic anemia Hgb 9.1, MCV 103.2 on admission. Likely secondary to alcohol use couple with anemia of chronic disease. B12 on the lower end of normal at 302 and folate 6.5, on previous admission. - Transfusion threshold <7 - Monitor with CBC  Tobacco use Smokes socially, threes cigarettes a month for the last 20 years.  - Consider nicotine patch   Peripheral neuropathy Patient follows Dr. Boneta Lucks, podiatry.  He was last seen in clinic on 10/15 and started on Lyrica 75 mg as gabapentin has not been controlling neuropathic pain well. On discharge, last admission Lyrica was discontinued.  Home meds: Gabapentin 100 mg 3 times daily (only taking 100 mg nightly) - hold gabapentin and Lyrica for now  CKD Stage 4 On admission, Cr 7.16, BUN 58, GFR 9. Appears to be stable as Cr 6.84 on the day of discharge . Has appointment with nephrology ([place]) on 08/16/20. At recent PCP visit pt reports diarrhea that was thought to be 2/2 Renvela which was discontinued. Renvela was started during acute renal failure in the hospital. PCP spoke with nephrology who recommended be trialed off Renvela to see if diarrhea improves.  - monitor on BMP - consider nephrology consult it patient renal function further declines  - Held Renvela   FEN/GI: renal diet fluid restriction 1500 ml  Prophylaxis: [place]  Disposition: med-tele, potential dc tomorrow  History of  Present Illness:  [name] is a 54 y.o. male presenting with hypoglycemia.  Patient reports his blood sugar was 198 last night and he "kinda freaked out" and took one of his old 5 mg Glipizide tablets. Reports his sugars dropped to 47 at home. His wife told him to eat pasta,  rice and drink soda. He had a repeat blood sugar in the 30s. Reports his lips get numb and start to tingle whenever his blood sugar drops too low. He reports brain fogginess (couldn't get out of the car today).   In the ED, patient's glucose was initially 45 prior to receiving 1 amp of dextrose which brought his glucose up to 216.  However, his glucose continued to drop over the next few hours and dropped to 38, so he was given another amp of dextrose and was started on IV dextrose infusion.  His glucose improved to 138 over the next hour.   Review Of Systems: Per HPI with the following additions:   Review of Systems  Constitutional: Negative for fever.  HENT: Negative for sore throat.   Eyes: Negative for blurred vision.  Cardiovascular: Negative for chest pain.  Gastrointestinal: Positive for diarrhea. Negative for abdominal pain, nausea and vomiting.  Genitourinary: Negative for dysuria.  Musculoskeletal: Negative for falls.  Skin:       Bruise on right wrist  Neurological: Positive for tingling (lips) and weakness. Negative for dizziness, loss of consciousness and headaches.    Patient Active Problem List   Diagnosis Date Noted  . Diarrhea 08/02/2020  . Hypertensive emergency 07/27/2020  . Nephrotic range proteinuria 07/27/2020  . Acute stress reaction 07/25/2020  . CKD (chronic kidney disease) stage 4, GFR 15-29 ml/min (HCC) 03/07/2020  . Hematuria 03/07/2020  . Foot callus 07/01/2018  . Hyperlipidemia 05/28/2016  . Hypertriglyceridemia 05/28/2016  . AKI (acute kidney injury) ([place]) 10/09/2012  . Type 2 diabetes mellitus with other specified complication ([place]) 37/01/8888  . Hypertension 04/14/2012  . DM (diabetes mellitus), type 2, uncontrolled ([place]) 04/13/2012    Past Medical History: Past Medical History:  Diagnosis Date  . Allergy   . CKD (chronic kidney disease), stage IV ([place])   . Diabetes mellitus    Type 2 DM, Patient reports DM Type 1 diagnosd age at age 58  .  Diabetic foot ulcers ([place])   . Foreign body in left foot    with  infection  . Hypertension   . Meniscus tear   . Multiple rib fractures   . Personal history of diabetic foot ulcer, Left Foot     Past Surgical History: Past Surgical History:  Procedure Laterality Date  . EYE SURGERY    . I & D EXTREMITY  04/23/2012   Procedure: IRRIGATION AND DEBRIDEMENT EXTREMITY;  Surgeon: Newt Minion, MD;  Location: [facility];  Service: Orthopedics;  Laterality: Left;  Irrigation and Debridement Left Foot  . I & D EXTREMITY  09/18/2012   Procedure: IRRIGATION AND DEBRIDEMENT EXTREMITY;  Surgeon: Newt Minion, MD;  Location: [facility];  Service: Orthopedics;  Laterality: Left;  . MENISCUS REPAIR Left 04/2016   Orthopedist, Dr [name] in [place], [place]    Social History: Social History   Tobacco Use  . Smoking status: Former Smoker    Types: Cigarettes    Quit date: 09/03/2012    Years since quitting: 7.9  . Smokeless tobacco: Former Systems developer    Quit date: 09/03/2012  Substance Use Topics  . Alcohol use: Yes  Alcohol/week: 8.0 standard drinks    Types: 6 Cans of beer, 2 Shots of liquor per week    Comment: weekends.  . Drug use: No   Additional social history:  Smokes 3 cigarettes a month socially.  Drinks 4 units of liquor a week.  Denies recreational drug use but has done so in the past. Please also refer to relevant sections of EMR.  Family History: Family History  Problem Relation Age of Onset  . Diabetes type II Mother   . Arthritis Mother   . Diabetes Mother   . Hyperlipidemia Mother   . Hypertension Mother   . Diabetes type II Sister   . Diabetes Father     Allergies and Medications: Allergies  Allergen Reactions  . Latex Rash and Other (See Comments)    Pt states he is allergic to latex condoms  . Ace Inhibitors Cough   No current facility-administered medications on file prior to encounter.   Current Outpatient Medications on File Prior to Encounter  Medication Sig  Dispense Refill  . acetaminophen (TYLENOL) 325 MG tablet Take 325 mg by mouth every 6 (six) hours as needed for mild pain or headache.    . carvedilol (COREG) 12.5 MG tablet Take 1 tablet (12.5 mg total) by mouth 2 (two) times daily with a meal. 60 tablet 0  . gabapentin (NEURONTIN) 100 MG capsule Take 1 capsule (100 mg total) by mouth 3 (three) times daily. (Patient taking differently: Take 100 mg by mouth at bedtime. ) 90 capsule 3  . hydrALAZINE (APRESOLINE) 25 MG tablet Take 1.5 tablets (37.5 mg total) by mouth 3 (three) times daily. 135 tablet 0  . sodium bicarbonate 650 MG tablet Take 2 tablets (1,300 mg total) by mouth 3 (three) times daily. 180 tablet 0  . amLODipine (NORVASC) 10 MG tablet Take 1 tablet (10 mg total) by mouth at bedtime. 90 tablet 3  . Blood Glucose Monitoring Suppl (BLOOD GLUCOSE MONITOR SYSTEM) w/Device KIT 1 kit by Does not apply route daily. 1 kit 0  . sevelamer carbonate (RENVELA) 800 MG tablet Take 1 tablet (800 mg total) by mouth 3 (three) times daily with meals. 90 tablet 0  . terbinafine (LAMISIL) 1 % cream Apply 1 application topically 2 (two) times daily. For 1 week to arms 30 g 0    Objective: BP (!) 163/77   Pulse 70   Temp 97.7 F (36.5 C) (Oral)   Resp 18   SpO2 99%   Exam: General: Overweight male, resting comfortably in bed, NAD Eyes: PERRL, EOMI ENTM: MMM Neck: supple Cardiovascular: RRR, no murmurs, no edema Respiratory: CTAB Gastrointestinal: soft, non-tender, +BS MSK: moving all extremities Derm: warm, dry, right upper chest with well-circumscribed circular erythematous areas consistent with irritation from EKG lead placement Neuro: A&Ox3 Psych: affect appropriate  Labs and Imaging: CBC BMET  Recent Labs  Lab 08/03/20 1533 08/03/20 1533 08/03/20 1541  WBC 10.6*  --   --   HGB 9.1*   < > 8.5*  HCT 29.0*   < > 25.0*  PLT 157  --   --    < > = values in this interval not displayed.   Recent Labs  Lab 08/03/20 1533  08/03/20 1533 08/03/20 1541  NA 138   < > 139  K 3.9   < > 3.9  CL 110   < > 110  CO2 14*  --   --   BUN 75*   < > 76*  CREATININE 7.16*   < >  7.70*  GLUCOSE 58*   < > 53*  CALCIUM 7.6*  --   --    < > = values in this interval not displayed.      Zola Button, MD 08/03/2020, 5:57 PM PGY-1, [place] Intern pager: [phone], text pages welcome   FPTS Upper-Level Resident Addendum   I have independently interviewed and examined the patient. I have discussed the above with the original author and agree with their documentation. My edits for correction/addition/clarification are in blue Please see also any attending notes.   Lyndee Hensen, DO PGY-2, [facility] 08/03/2020 9:20 PM  [facility] Service pager: [phone] (text pages welcome through [facility])

## 2020-08-03 NOTE — ED Triage Notes (Signed)
Pt states he is having problems to keep his blood sugar up since last night cbg 45 on triage.

## 2020-08-03 NOTE — ED Notes (Signed)
Patient moved from ER stretcher to hospital bed. Wife remains at bedside.

## 2020-08-03 NOTE — ED Notes (Signed)
Pt given sandwich and applesauce, declines more juice.

## 2020-08-03 NOTE — ED Notes (Signed)
Pt given 8 oz ginger ale and an applesauce.

## 2020-08-03 NOTE — ED Notes (Signed)
Attempted to call report

## 2020-08-03 NOTE — ED Notes (Signed)
Pt given another sandwich and applesauce with cheese and graham crackers.

## 2020-08-03 NOTE — Progress Notes (Signed)
FPTS Interim Progress Note  S: Went to examine patient with Dr. Posey Pronto. He stated he was feeling anxious about his low blood sugars. Also stated that his right arm was bruised and painful where his IV site was. Denied dizziness, lightheadedness, nausea.   Of note, nurse called and asked for patient to be moved from med-tele to progressive due to him needing CBGs every 15 minutes and needing more assistance.    O: BP (!) 190/92   Pulse 77   Temp 97.7 F (36.5 C) (Oral)   Resp 16   SpO2 98%   General: Alert, appears stated age, NAD Cardiovascular: RRR no murmurs Respiratory: CTAB. Normal WOB Abdomen: soft, non distended  Derm: R arm with ecchymoses, warmth and tenderness around IV insertion site   A/P: Hypoglycemia  S/p 1 amp D50 after CBG of 73.  Continuing with D10 infusion at 166mL/hr CBG monitoring q 2 hours. If CBG >100 for 2 checks, will move to CBG checks q 4 hours  Hydroxyzine 10mg  TID prn for anxiety  Continue with plans per day team's H&P  Shary Key, DO 08/03/2020, 9:28 PM PGY-1, [place] Medicine Service pager [phone]

## 2020-08-03 NOTE — ED Notes (Signed)
Pt declining cardiac monitoring at this time.  Blistering circles noted on R chest wall from electrodes, pt reports from most recent hospitalization.

## 2020-08-03 NOTE — ED Notes (Signed)
Pt given 8 oz OJ. Pt AOx4, reports feeling "woozy," denies n/v, endorses fatigue.  MD made aware.

## 2020-08-04 DIAGNOSIS — E785 Hyperlipidemia, unspecified: Secondary | ICD-10-CM | POA: Diagnosis present

## 2020-08-04 DIAGNOSIS — E162 Hypoglycemia, unspecified: Secondary | ICD-10-CM | POA: Diagnosis present

## 2020-08-04 DIAGNOSIS — E1122 Type 2 diabetes mellitus with diabetic chronic kidney disease: Secondary | ICD-10-CM | POA: Diagnosis present

## 2020-08-04 DIAGNOSIS — I10 Essential (primary) hypertension: Secondary | ICD-10-CM | POA: Diagnosis not present

## 2020-08-04 DIAGNOSIS — E11649 Type 2 diabetes mellitus with hypoglycemia without coma: Secondary | ICD-10-CM | POA: Diagnosis present

## 2020-08-04 DIAGNOSIS — E1142 Type 2 diabetes mellitus with diabetic polyneuropathy: Secondary | ICD-10-CM | POA: Diagnosis present

## 2020-08-04 DIAGNOSIS — N184 Chronic kidney disease, stage 4 (severe): Secondary | ICD-10-CM

## 2020-08-04 DIAGNOSIS — E1169 Type 2 diabetes mellitus with other specified complication: Secondary | ICD-10-CM | POA: Diagnosis present

## 2020-08-04 DIAGNOSIS — R197 Diarrhea, unspecified: Secondary | ICD-10-CM | POA: Diagnosis present

## 2020-08-04 DIAGNOSIS — Z7984 Long term (current) use of oral hypoglycemic drugs: Secondary | ICD-10-CM | POA: Diagnosis not present

## 2020-08-04 DIAGNOSIS — I129 Hypertensive chronic kidney disease with stage 1 through stage 4 chronic kidney disease, or unspecified chronic kidney disease: Secondary | ICD-10-CM | POA: Diagnosis present

## 2020-08-04 DIAGNOSIS — Z833 Family history of diabetes mellitus: Secondary | ICD-10-CM | POA: Diagnosis not present

## 2020-08-04 DIAGNOSIS — Z888 Allergy status to other drugs, medicaments and biological substances status: Secondary | ICD-10-CM | POA: Diagnosis not present

## 2020-08-04 DIAGNOSIS — E781 Pure hyperglyceridemia: Secondary | ICD-10-CM | POA: Diagnosis present

## 2020-08-04 DIAGNOSIS — D539 Nutritional anemia, unspecified: Secondary | ICD-10-CM | POA: Diagnosis present

## 2020-08-04 DIAGNOSIS — Z8249 Family history of ischemic heart disease and other diseases of the circulatory system: Secondary | ICD-10-CM | POA: Diagnosis not present

## 2020-08-04 DIAGNOSIS — Z79899 Other long term (current) drug therapy: Secondary | ICD-10-CM | POA: Diagnosis not present

## 2020-08-04 DIAGNOSIS — F17210 Nicotine dependence, cigarettes, uncomplicated: Secondary | ICD-10-CM | POA: Diagnosis present

## 2020-08-04 DIAGNOSIS — Z83438 Family history of other disorder of lipoprotein metabolism and other lipidemia: Secondary | ICD-10-CM | POA: Diagnosis not present

## 2020-08-04 DIAGNOSIS — Z91040 Latex allergy status: Secondary | ICD-10-CM | POA: Diagnosis not present

## 2020-08-04 DIAGNOSIS — L97509 Non-pressure chronic ulcer of other part of unspecified foot with unspecified severity: Secondary | ICD-10-CM | POA: Diagnosis present

## 2020-08-04 DIAGNOSIS — E11621 Type 2 diabetes mellitus with foot ulcer: Secondary | ICD-10-CM | POA: Diagnosis present

## 2020-08-04 DIAGNOSIS — Z20822 Contact with and (suspected) exposure to covid-19: Secondary | ICD-10-CM | POA: Diagnosis present

## 2020-08-04 DIAGNOSIS — D631 Anemia in chronic kidney disease: Secondary | ICD-10-CM | POA: Diagnosis present

## 2020-08-04 LAB — BASIC METABOLIC PANEL
Anion gap: 11 (ref 5–15)
BUN: 74 mg/dL — ABNORMAL HIGH (ref 6–20)
CO2: 19 mmol/L — ABNORMAL LOW (ref 22–32)
Calcium: 7.4 mg/dL — ABNORMAL LOW (ref 8.9–10.3)
Chloride: 107 mmol/L (ref 98–111)
Creatinine, Ser: 6.85 mg/dL — ABNORMAL HIGH (ref 0.61–1.24)
GFR, Estimated: 9 mL/min — ABNORMAL LOW (ref >60)
Glucose, Bld: 50 mg/dL — ABNORMAL LOW (ref 70–99)
Potassium: 4.2 mmol/L (ref 3.5–5.1)
Sodium: 137 mmol/L (ref 135–145)

## 2020-08-04 LAB — GLUCOSE, CAPILLARY
Glucose-Capillary: 109 mg/dL — ABNORMAL HIGH (ref 70–99)
Glucose-Capillary: 113 mg/dL — ABNORMAL HIGH (ref 70–99)
Glucose-Capillary: 114 mg/dL — ABNORMAL HIGH (ref 70–99)
Glucose-Capillary: 124 mg/dL — ABNORMAL HIGH (ref 70–99)

## 2020-08-04 LAB — CBG MONITORING, ED
Glucose-Capillary: 116 mg/dL — ABNORMAL HIGH (ref 70–99)
Glucose-Capillary: 125 mg/dL — ABNORMAL HIGH (ref 70–99)
Glucose-Capillary: 39 mg/dL — CL (ref 70–99)
Glucose-Capillary: 46 mg/dL — ABNORMAL LOW (ref 70–99)
Glucose-Capillary: 75 mg/dL (ref 70–99)
Glucose-Capillary: 78 mg/dL (ref 70–99)
Glucose-Capillary: 90 mg/dL (ref 70–99)
Glucose-Capillary: 93 mg/dL (ref 70–99)
Glucose-Capillary: 94 mg/dL (ref 70–99)

## 2020-08-04 LAB — CBC
HCT: 26.9 % — ABNORMAL LOW (ref 39.0–52.0)
Hemoglobin: 8.6 g/dL — ABNORMAL LOW (ref 13.0–17.0)
MCH: 32.2 pg (ref 26.0–34.0)
MCHC: 32 g/dL (ref 30.0–36.0)
MCV: 100.7 fL — ABNORMAL HIGH (ref 80.0–100.0)
Platelets: 158 10*3/uL (ref 150–400)
RBC: 2.67 MIL/uL — ABNORMAL LOW (ref 4.22–5.81)
RDW: 13.5 % (ref 11.5–15.5)
WBC: 12.1 10*3/uL — ABNORMAL HIGH (ref 4.0–10.5)
nRBC: 0 % (ref 0.0–0.2)

## 2020-08-04 MED ORDER — GLUCOSE 40 % PO GEL
1.0000 | ORAL | Status: DC | PRN
  Administered 2020-08-04 (×2): 37.5 g via ORAL
  Filled 2020-08-04 (×2): qty 1

## 2020-08-04 MED ORDER — ONDANSETRON 4 MG PO TBDP
4.0000 mg | ORAL_TABLET | Freq: Three times a day (TID) | ORAL | Status: DC | PRN
  Administered 2020-08-04: 4 mg via ORAL
  Filled 2020-08-04: qty 1

## 2020-08-04 MED ORDER — ONDANSETRON 4 MG PO TBDP
4.0000 mg | ORAL_TABLET | Freq: Once | ORAL | Status: AC
  Administered 2020-08-04: 4 mg via ORAL
  Filled 2020-08-04: qty 1

## 2020-08-04 MED ORDER — CALCIUM CARBONATE ANTACID 500 MG PO CHEW: 2.0000 | CHEWABLE_TABLET | Freq: Three times a day (TID) | ORAL | Status: DC | PRN

## 2020-08-04 MED ORDER — ACETAMINOPHEN 325 MG PO TABS
650.0000 mg | ORAL_TABLET | Freq: Four times a day (QID) | ORAL | Status: DC | PRN
  Administered 2020-08-04: 650 mg via ORAL
  Filled 2020-08-04: qty 2

## 2020-08-04 NOTE — ED Notes (Signed)
Patient had light brown BM, loose on BSC. Ambulated without difficulty. Patient back in bed, denies further needs.

## 2020-08-04 NOTE — ED Notes (Signed)
Provider called and made aware of need for additional D50 and oral glucose gel. Patient remains AAOx4. Wife at bedside.

## 2020-08-04 NOTE — Progress Notes (Signed)
Family Medicine Teaching Service Daily Progress Note Intern Pager: [phone]  Patient name: [name] Medical record number: [id number] Date of birth: 06/29/1966 Age: 54 y.o. Gender: male  Primary Care Provider: Wilber Oliphant, MD Consultants: None   Code Status: Full Code  Pt Overview and Major Events to Date:  10/28 Admitted  Assessment and Plan: [name] is a 54 y.o. male presenting with medication-induced hypoglycemia. PMH is significant for HTN, CKD, T2DM. Recently admitted for AKI on CKD.  Hypoglycemia Improving on D10 infusion though required 3 amps of dextrose overnight (5 amps total since admission) and 2 doses of glucose gel. CBG 93 this AM. Possible dc tomorrow, would prefer to monitor him when he is able to come off D10. - continue D10, consider stopping if two CBGs >150 - monitor CBG q2h (can change to q4h if two CBGs > 100 when off of D10)  HTN Home meds: carvedilol 12.5 mg BID, amlodipine 10 mg, hydralazine 37.5 mg TID.  Elevated at 151/83 this AM, overall adequately controlled on current regimen.  CKD stage 4 Cr 7.16 on admission, appears to be stable since recent discharge. Cr 6.85 this AM. Outpatient nephrology appointment scheduled 11/10. - continue to monitor, consider nephrology consult if worsening - holding sevelamer  Vomiting Had 1 episode of vomiting this AM, states it was due to eating too fast. - one-time dose of Zofran ODT 4 mg ordered - monitor  FEN/GI: renal diet, fluid restriction 1500 ml PPx: [place]  Disposition: med-tele  Subjective:  Had an episode of vomiting earlier today, patient states it was due to him eating too fast.  Feels okay overall.  States he may have taken a second tablet of the glipizide XR 5 mg yesterday morning, but he does not remember.  Objective: Temp:  [97.7 F (36.5 C)] 97.7 F (36.5 C) (10/28 1526) Pulse Rate:  [66-77] 71 (10/29 0630) Resp:  [16-20] 18 (10/29 0630) BP: (121-190)/(57-92) 139/71  (10/29 0630) SpO2:  [94 %-99 %] 94 % (10/29 0630) Physical Exam: General: Overweight male, resting in bed, NAD Cardiovascular: RRR, no murmurs Respiratory: CTAB Abdomen: soft, non-tender, +BS Extremities: WWP, no edema  Laboratory: Recent Labs  Lab 07/29/20 0213 07/29/20 0213 08/03/20 1533 08/03/20 1541 08/04/20 0238  WBC 8.7  --  10.6*  --  12.1*  HGB 9.1*   < > 9.1* 8.5* 8.6*  HCT 27.4*   < > 29.0* 25.0* 26.9*  PLT 173  --  157  --  158   < > = values in this interval not displayed.   Recent Labs  Lab 07/29/20 0209 08/02/20 1627 08/03/20 1533 08/03/20 1541 08/04/20 0238  NA   < > 140 138 139 137  K   < > 4.8 3.9 3.9 4.2  CL   < > 107* 110 110 107  CO2  --  18* 14*  --  19*  BUN   < > 70* 75* 76* 74*  CREATININE   < > 7.07* 7.16* 7.70* 6.85*  CALCIUM  --  7.9* 7.6*  --  7.4*  PROT  --   --  5.9*  --   --   BILITOT  --   --  0.4  --   --   ALKPHOS  --   --  90  --   --   ALT  --   --  18  --   --   AST  --   --  18  --   --  GLUCOSE   < > 100* 58* 53* 50*   < > = values in this interval not displayed.     Imaging/Diagnostic Tests: No new imaging.  Zola Button, MD 08/04/2020, 9:08 AM PGY-1, [place] Intern pager: [phone], text pages welcome

## 2020-08-04 NOTE — Hospital Course (Addendum)
[name] is a 54 y.o. male with a history of HTN, CKD, T2DM who presented with medication-induced hypoglycemia (took old glipizide). Hospital course outlined by problem below:  Hypoglycemia In the ED, he presented with glucose of 45 and received 1 amp of dextrose with adequate response; however, his glucose continued to drop to 38 over the next 2 hours, so he was given another amp of dextrose and was started on D10 infusion. He received a total of 5*** amps of dextrose and 2 glucose gels for hypoglycemic episodes. By the time of discharge, he was able to maintain euglycemia off of D10 infusion.  Other problems chronic and stable.  Check to see what patient was sent home on for his potassium last time he was discharged and what dose- lokelma? Kayexalate? There as been a problem in the past with him affording Rx. [facility] has coupon for $25 for kayexalate

## 2020-08-04 NOTE — Progress Notes (Signed)
Family Medicine Teaching Service Daily Progress Note Intern Pager: [phone]  Patient name: [name] Medical record number: [id number] Date of birth: [birth year] Age: 54 y.o. Gender: male  Primary Care Provider: Wilber Oliphant, MD Consultants: None Code Status: Full   Pt Overview and Major Events to Date:  10/28 Admitted   Assessment and Plan: [name] is a 54 y.o. male presenting with medication-induced hypoglycemia. PMH is significant forHTN, CKD, T2DM.Recently admitted for AKI on CKD.  Hypoglycemia Improved with D10 infusion which was d/c yesterday afternoon. CBG 91 this AM.  - monitor CBG q4h  HTN Home meds: carvedilol 12.5 mg BID, amlodipine 10 mg, hydralazine 37.5 mg TID.BP 142/83 this AM, overall adequately controlled on current regimen. - con't home meds - monitor   CKD stage 4 Cr 7.16 on admission. Cr 7.37 this AM. Outpatient nephrology appointment scheduled 11/10. - continue to monitor, consider nephrology consult if worsening - holding sevelamer  Headache Complained of headache last night that has resolved this morning.  - Tylenol 650mg  q6h prn   Vomiting, resolved  Had 1 episode of vomiting yesterday morning, and had one-time dose of Zofran ODT 4 mg ordered - monitor  FEN/GI: renal diet, fluid restriction 1500 ml PPx: [place]  Disposition: med-tele  Can likely d/c today   Subjective:  Patient doing well this morning, sitting up at the edge of the bed2. Stated he had some back pain last night which feels better. He denise headaches, dizziness, lightheadedness. Requesting to be discharged today stating he has to be in [place] at 11am.   Objective: Temp:  [98.7 F (37.1 C)-98.9 F (37.2 C)] 98.7 F (37.1 C) (10/29 1920) Pulse Rate:  [66-80] 79 (10/29 1920) Resp:  [16-18] 17 (10/29 1920) BP: (121-190)/(57-92) 152/81 (10/29 1920) SpO2:  [89 %-98 %] 97 % (10/29 1920) Physical Exam: General: pleasant overweight male, NAD  Cardiovascular:  RRR no murmurs Respiratory: CTAB. Normal WOB Abdomen: soft, non distended, non tender  Extremities: warm, dry. Well perfused. Distal pulses 2+ bilaterally   Laboratory: Recent Labs  Lab 07/29/20 0213 07/29/20 0213 08/03/20 1533 08/03/20 1541 08/04/20 0238  WBC 8.7  --  10.6*  --  12.1*  HGB 9.1*   < > 9.1* 8.5* 8.6*  HCT 27.4*   < > 29.0* 25.0* 26.9*  PLT 173  --  157  --  158   < > = values in this interval not displayed.   Recent Labs  Lab 07/29/20 0209 08/02/20 1627 08/03/20 1533 08/03/20 1541 08/04/20 0238  NA   < > 140 138 139 137  K   < > 4.8 3.9 3.9 4.2  CL   < > 107* 110 110 107  CO2  --  18* 14*  --  19*  BUN   < > 70* 75* 76* 74*  CREATININE   < > 7.07* 7.16* 7.70* 6.85*  CALCIUM  --  7.9* 7.6*  --  7.4*  PROT  --   --  5.9*  --   --   BILITOT  --   --  0.4  --   --   ALKPHOS  --   --  90  --   --   ALT  --   --  18  --   --   AST  --   --  18  --   --   GLUCOSE   < > 100* 58* 53* 50*   < > = values in this interval not  displayed.    Imaging/Diagnostic Tests: No new images   Shary Key, DO 08/04/2020, 8:59 PM PGY-1, [place] Intern pager: [phone], text pages welcome

## 2020-08-05 DIAGNOSIS — I10 Essential (primary) hypertension: Secondary | ICD-10-CM | POA: Diagnosis not present

## 2020-08-05 DIAGNOSIS — E1122 Type 2 diabetes mellitus with diabetic chronic kidney disease: Secondary | ICD-10-CM | POA: Diagnosis not present

## 2020-08-05 DIAGNOSIS — E162 Hypoglycemia, unspecified: Secondary | ICD-10-CM | POA: Diagnosis not present

## 2020-08-05 DIAGNOSIS — N184 Chronic kidney disease, stage 4 (severe): Secondary | ICD-10-CM | POA: Diagnosis not present

## 2020-08-05 LAB — RENAL FUNCTION PANEL
Albumin: 2.4 g/dL — ABNORMAL LOW (ref 3.5–5.0)
Anion gap: 12 (ref 5–15)
BUN: 81 mg/dL — ABNORMAL HIGH (ref 6–20)
CO2: 17 mmol/L — ABNORMAL LOW (ref 22–32)
Calcium: 7.6 mg/dL — ABNORMAL LOW (ref 8.9–10.3)
Chloride: 106 mmol/L (ref 98–111)
Creatinine, Ser: 7.37 mg/dL — ABNORMAL HIGH (ref 0.61–1.24)
GFR, Estimated: 8 mL/min — ABNORMAL LOW (ref >60)
Glucose, Bld: 102 mg/dL — ABNORMAL HIGH (ref 70–99)
Phosphorus: 8.4 mg/dL — ABNORMAL HIGH (ref 2.5–4.6)
Potassium: 5 mmol/L (ref 3.5–5.1)
Sodium: 135 mmol/L (ref 135–145)

## 2020-08-05 LAB — GLUCOSE, CAPILLARY
Glucose-Capillary: 91 mg/dL (ref 70–99)
Glucose-Capillary: 126 mg/dL — ABNORMAL HIGH (ref 70–99)
Glucose-Capillary: 85 mg/dL (ref 70–99)

## 2020-08-05 MED ORDER — SODIUM POLYSTYRENE SULFONATE 15 GM/60ML PO SUSP: 15.0000 g | Freq: Every day | ORAL | 0 refills | Status: DC

## 2020-08-05 MED ORDER — SODIUM ZIRCONIUM CYCLOSILICATE 10 G PO PACK
10.0000 g | PACK | Freq: Two times a day (BID) | ORAL | Status: DC
  Administered 2020-08-05: 10 g via ORAL
  Filled 2020-08-05: qty 1

## 2020-08-05 MED ORDER — SODIUM ZIRCONIUM CYCLOSILICATE 10 G PO PACK: 10.0000 g | PACK | Freq: Two times a day (BID) | ORAL | 0 refills | Status: DC

## 2020-08-05 NOTE — Discharge Summary (Signed)
[facility] Discharge Summary  Patient name: [name] Medical record number: [id number] Date of birth: [birth year] Age: 54 y.o. Gender: male Date of Admission: 08/03/2020  Date of Discharge: 08/05/20 Admitting Physician: Zola Button, MD  Primary Care Provider: Wilber Oliphant, MD Consultants: None  Indication for Hospitalization: Hypoglycemia   Discharge Diagnoses/Problem List:  Hypoglycemia  CKD Stage 4  HTN  T2DM    Disposition: Home   Discharge Condition: Stable   Discharge Exam:   BP (!) 142/83 (BP Location: Left Arm)    Pulse 82    Temp 98.2 F (36.8 C) (Oral)    Resp 17    SpO2 98%    General: pleasant overweight male, NAD  Cardiovascular: RRR no murmurs Respiratory: CTAB. Normal WOB Abdomen: soft, non distended, non tender  Extremities: warm, dry. Well perfused. Distal pulses 2+ bilaterally   Brief Hospital Course:   [name] is a 54 y.o. male with a history of HTN, CKD, T2DM who presented with medication-induced hypoglycemia (took old glipizide). Hospital course outlined by problem below:  Hypoglycemia In the ED, he presented with glucose of 45 and received 1 amp of dextrose with adequate response; however, his glucose continued to drop to 38 over the next 2 hours, so he was given another amp of dextrose and was started on D10 infusion. He received a total of 5 amps of dextrose and 2 glucose gels for hypoglycemic episodes. Glucoses were stable off dextrose gtt >12 hours prior to discharge.   CKD Stage 4 Cr was 7.16 on admission and by time of discharge was 7.37, GFR 8, BUN 81, Phos 8.4. His home sevalemer was held secondarily to diarrhea. Potassium was 5.0 on the day of discharge. He was discharged with kayexalate. Patient to follow up with Nephrology outpatient on 08/16/20 regarding worsening kidney function.   Home medications were continued for other chronic diseases which remained stable.    Issues for Follow Up:   1. Follow up with PCP Dr. Maudie Mercury to check on diabetes and discuss medication. Make sure to avoid the Glipizide  2. Follow up with Nephrologist on declining kidney function and management of ongoing acute kidney failure.  3. Recommend repeating renal function panel at follow up to assess creatine and electrolytes.   Significant Procedures: None  Significant Labs and Imaging:  Recent Labs  Lab 08/03/20 1533 08/03/20 1541 08/04/20 0238  WBC 10.6*  --  12.1*  HGB 9.1* 8.5* 8.6*  HCT 29.0* 25.0* 26.9*  PLT 157  --  158   Recent Labs  Lab 08/02/20 1627 08/02/20 1627 08/03/20 1533 08/03/20 1533 08/03/20 1541 08/03/20 1541 08/04/20 0238 08/05/20 0147  NA 140  --  138  --  139  --  137 135  K 4.8   < > 3.9   < > 3.9   < > 4.2 5.0  CL 107*  --  110  --  110  --  107 106  CO2 18*  --  14*  --   --   --  19* 17*  GLUCOSE 100*  --  58*  --  53*  --  50* 102*  BUN 70*  --  75*  --  76*  --  74* 81*  CREATININE 7.07*  --  7.16*  --  7.70*  --  6.85* 7.37*  CALCIUM 7.9*  --  7.6*  --   --   --  7.4* 7.6*  PHOS 7.1*  --   --   --   --   --   --  8.4*  ALKPHOS  --   --  90  --   --   --   --   --   AST  --   --  18  --   --   --   --   --   ALT  --   --  18  --   --   --   --   --   ALBUMIN 3.3*  --  2.5*  --   --   --   --  2.4*   < > = values in this interval not displayed.    Results/Tests Pending at Time of Discharge: None   Discharge Medications:  Allergies as of 08/05/2020      Reactions   Latex Rash, Other (See Comments)   Pt states he is allergic to latex condoms   Ace Inhibitors Cough      Medication List    TAKE these medications   acetaminophen 325 MG tablet Commonly known as: TYLENOL Take 325 mg by mouth every 6 (six) hours as needed for mild pain or headache.   amLODipine 10 MG tablet Commonly known as: NORVASC Take 1 tablet (10 mg total) by mouth at bedtime.   Blood Glucose Monitor System w/Device Kit 1 kit by Does not apply route daily.   carvedilol 12.5  MG tablet Commonly known as: COREG Take 1 tablet (12.5 mg total) by mouth 2 (two) times daily with a meal.   gabapentin 100 MG capsule Commonly known as: NEURONTIN Take 1 capsule (100 mg total) by mouth 3 (three) times daily. What changed: when to take this   hydrALAZINE 25 MG tablet Commonly known as: APRESOLINE Take 1.5 tablets (37.5 mg total) by mouth 3 (three) times daily.   sevelamer carbonate 800 MG tablet Commonly known as: RENVELA Take 1 tablet (800 mg total) by mouth 3 (three) times daily with meals.   sodium bicarbonate 650 MG tablet Take 2 tablets (1,300 mg total) by mouth 3 (three) times daily.   sodium polystyrene 15 GM/60ML suspension Commonly known as: KAYEXALATE Take 60 mLs (15 g total) by mouth daily.   terbinafine 1 % cream Commonly known as: LAMISIL Apply 1 application topically 2 (two) times daily. For 1 week to arms       Discharge Instructions: Please refer to Patient Instructions section of EMR for full details.  Patient was counseled important signs and symptoms that should prompt return to medical care, changes in medications, dietary instructions, activity restrictions, and follow up appointments.   Follow-Up Appointments:   Follow-up Information    Wilber Oliphant, MD. Go on 08/08/2020.   Specialty: Family Medicine Why: at 1:55 PM  Contact information: [address] [place] [address] [place], [place], DO 08/05/2020, 1:18 PM PGY-1, [place], DO PGY-2, [facility] 08/05/2020 1:18 PM

## 2020-08-05 NOTE — Discharge Instructions (Signed)
Dear [name],   Thank you so much for allowing [place] to be part of your care!  You were admitted to [facility] for hypoglycemia due to your Glipizide medication.    POST-HOSPITAL & CARE INSTRUCTIONS 1. Please make sure to follow up with your Nephrology and PCP appointments listed below.  2. Please let PCP/Specialists know of any changes that were made.  3. Please see medications section of this packet for any medication changes. Please make sure to pick up Kayexalate for your potassium. A coupon can be found on the website "[facility]"  4. Make sure to avoid using the Glipizide medication   DOCTOR'S APPOINTMENT & FOLLOW UP CARE INSTRUCTIONS  Future Appointments  Date Time Provider C[place]  08/09/2020  1:00 PM [facility]-CASE MANAGER [facility] [place]  08/29/2020  3:10 PM Wilber Oliphant, MD [facility]  10/25/2020  1:45 PM Felipa Furnace, DPM [facility]    RETURN PRECAUTIONS: Return if you experience low blood sugar again and experience lightheadedness, dizziness, extreme nausea   Take care and be well!  [facility]  [place], [place] [address] [phone]

## 2020-08-08 ENCOUNTER — Telehealth: Payer: Self-pay | Admitting: Family Medicine

## 2020-08-08 ENCOUNTER — Telehealth: Payer: Self-pay | Admitting: *Deleted

## 2020-08-08 ENCOUNTER — Inpatient Hospital Stay: Payer: 59

## 2020-08-08 ENCOUNTER — Encounter: Payer: Self-pay | Admitting: *Deleted

## 2020-08-08 DIAGNOSIS — N184 Chronic kidney disease, stage 4 (severe): Secondary | ICD-10-CM

## 2020-08-08 NOTE — Telephone Encounter (Signed)
This was discussed at our last visit and I am aware. Called patient and he reports that he has the medication and has been taking it daily. Patient's diarrhea has improved and he is feeling less weak. He was able to go for a walk and shop on Sunday. He is taking his medications and reports stable vital signs. Regarding his appointment today, he reports that he thought it was tomorrow. He is agreeable to coming in on Thursday for a lab check. Future orders placed and lab appt made. Will follow up with patient on Friday with results.   Wilber Oliphant, M.D.  5:26 PM 08/08/2020

## 2020-08-08 NOTE — Telephone Encounter (Signed)
Patient no-showed for clinic appointment today.  Left voicemail explaining the importance of rearranging the visit and arranging labs for kidney function.  Lattie Haw MD  PGY 2,  [place] [facility]

## 2020-08-08 NOTE — Addendum Note (Signed)
Addended by: Wilber Oliphant on: 08/08/2020 05:28 PM   Modules accepted: Orders

## 2020-08-08 NOTE — Telephone Encounter (Signed)
Received fax that says KAYEXALATE is not covered. Cost is $170. [name], CMA

## 2020-08-09 ENCOUNTER — Telehealth: Payer: 59

## 2020-08-09 ENCOUNTER — Telehealth: Payer: Self-pay | Admitting: *Deleted

## 2020-08-09 ENCOUNTER — Telehealth: Payer: Self-pay

## 2020-08-09 DIAGNOSIS — E1165 Type 2 diabetes mellitus with hyperglycemia: Secondary | ICD-10-CM

## 2020-08-09 NOTE — Telephone Encounter (Signed)
Ophthalmology referral placed.

## 2020-08-09 NOTE — Telephone Encounter (Signed)
  Care Management   Outreach Note  08/09/2020 Name: [name] MRN: [id number] DOB: 12/20/1965  Referred by: Wilber Oliphant, MD Reason for referral : Appointment (Initial)   An unsuccessful telephone outreach was attempted today. The patient was referred to the case management team for assistance with care management and care coordination.   Follow Up Plan: A HIPAA compliant phone message was left for the patient providing contact information and requesting a return call.  The care management team will reach out to the patient again over the next 7-14 days.   RNCM will follow up with the patient within the next month of  XXXX   Lazaro Arms RN, BSN, [place] Phone: [phone] Fax: [phone]

## 2020-08-09 NOTE — Telephone Encounter (Signed)
Patient called requesting a referral to see an eye doctor for his diabetes.  States that he is having some sensitivity to light.  Patient has a lab appt tomorrow and sees a provider on 08-15-20.  Will forward to Md to place this referral.  Johnney Ou

## 2020-08-10 ENCOUNTER — Other Ambulatory Visit: Payer: 59

## 2020-08-10 ENCOUNTER — Telehealth: Payer: Self-pay | Admitting: *Deleted

## 2020-08-10 ENCOUNTER — Other Ambulatory Visit: Payer: Self-pay

## 2020-08-10 DIAGNOSIS — N184 Chronic kidney disease, stage 4 (severe): Secondary | ICD-10-CM

## 2020-08-10 NOTE — Chronic Care Management (AMB) (Signed)
  Care Management   Note  08/10/2020 Name: [name] MRN: [id number] DOB: 1966/05/20  [name] is a 54 y.o. year old male who is a primary care patient of Wilber Oliphant, MD and is actively engaged with the care management team. I reached out to [facility] by phone today to assist with re-scheduling an initial visit with the RN Case Manager  Follow up plan: Unsuccessful telephone outreach attempt made. A HIPAA compliant phone message was left for the patient providing contact information and requesting a return call.  The care management team will reach out to the patient again over the next 7 days.  If patient returns call to provider office, please advise to call [place] at [place] Management

## 2020-08-11 ENCOUNTER — Other Ambulatory Visit: Payer: 59

## 2020-08-11 LAB — CBC
Hematocrit: 27.9 % — ABNORMAL LOW (ref 37.5–51.0)
Hemoglobin: 9 g/dL — ABNORMAL LOW (ref 13.0–17.7)
MCH: 31.6 pg (ref 26.6–33.0)
MCHC: 32.3 g/dL (ref 31.5–35.7)
MCV: 98 fL — ABNORMAL HIGH (ref 79–97)
Platelets: 253 10*3/uL (ref 150–450)
RBC: 2.85 x10E6/uL — ABNORMAL LOW (ref 4.14–5.80)
RDW: 12.9 % (ref 11.6–15.4)
WBC: 9.3 10*3/uL (ref 3.4–10.8)

## 2020-08-11 LAB — RENAL FUNCTION PANEL
Albumin: 3.3 g/dL — ABNORMAL LOW (ref 3.8–4.9)
BUN/Creatinine Ratio: 11 (ref 9–20)
BUN: 109 mg/dL (ref 6–24)
CO2: 17 mmol/L — ABNORMAL LOW (ref 20–29)
Calcium: 7 mg/dL — ABNORMAL LOW (ref 8.7–10.2)
Chloride: 106 mmol/L (ref 96–106)
Creatinine, Ser: 9.95 mg/dL — ABNORMAL HIGH (ref 0.76–1.27)
GFR calc Af Amer: 6 mL/min/{1.73_m2} — ABNORMAL LOW (ref >59)
GFR calc non Af Amer: 5 mL/min/{1.73_m2} — ABNORMAL LOW (ref >59)
Glucose: 79 mg/dL (ref 65–99)
Phosphorus: 10 mg/dL (ref 2.8–4.1)
Potassium: 5.8 mmol/L — ABNORMAL HIGH (ref 3.5–5.2)
Sodium: 141 mmol/L (ref 134–144)

## 2020-08-11 NOTE — Patient Instructions (Signed)
It was nice seeing you today, [name].  Today, we talked about your lab results.  Your potassium and phosphorus were elevated, so it is very important that you continue taking your Kayexalate and sodium bicarbonate.  Your blood pressure was elevated today.  You may eventually need to increase your blood pressure medications, but I will not make any changes today.  We will repeat your blood test today and I will update you on the results.  It is very important that you keep your appointment tomorrow with nephrology.  Stay well, Earl Button, MD

## 2020-08-11 NOTE — Progress Notes (Addendum)
Current medications reviewed with patient.  Taking - Amlodipine 10 mg daily  Taking - Sodium bicarbonate 650 mg, 2 tablets TID  Taking - Hydralazine 25mg , 1.5 tablets TID   Taking - Carvedilol 1 tablet BID   Sodium polystyrene (kayexalate) 60 mL once daily - took for the first time today since being out of the hospital.   Spoke to patient about his lab results from yesterday.  Patient reports that he has not been taking his Kayexalate as he does not like the taste of it and it also causes him to have diarrhea.  Patient does report he has been taking his other medications as listed above.  Patient reports that on Monday he did use alcohol and recreational drugs but he has overall been feeling his normal self.  I spoke to Dr. Joylene Grapes over the phone as a curbside given this patient's lab results and overall clinical picture.  He recommended that patient continue his medication at this time with strict return precautions to the ED.  If he can continue to take his medications and does not have any change in his clinical picture, he can continue his medications at home until his appointment with Dr. Candiss Norse on 08/16/2020.  If he does not take the Kayexalate, he can take 10 g of Lokelma once daily. (If patient cannot afford the [facility], he can get some free samples from [facility]).  Patient is agreeable to taking Kayexalate every day.  Again, reiterated the importance of taking this medication due to risk of cardiac arrhythmias and sudden cardiac death in the setting of high potassium.  For hyperphosphatemia, Dr. Joylene Grapes also recommended that patient take 2 Tums with each meal rather than taking the Renvela as this also causes him to have diarrhea.  Explained to patient at length the importance of taking his medications.  I gave him very specific return precautions to go to the emergency department for possible need for emergency dialysis and explained the risk of hospitalization or even  mortality in the setting of his worsening lab abnormalities.  It is very likely that this patient will need to start dialysis (likely within the next few weeks) given his renal status.  Patient expressed understanding. Patient also advised to call the office to contact the physician on call should he have any other questions on the weekend.  Wilber Oliphant, M.D.  2:14 PM 08/11/2020

## 2020-08-11 NOTE — Progress Notes (Addendum)
° ° °  SUBJECTIVE:   CHIEF COMPLAINT / HPI: F/u lab work  CKD Patient is here for follow-up for lab work, had renal function panel and CBC done on 11/4. PCP Dr. Zettie Cooley discussed lab results with patient over the phone. Patient states he has been compliant with taking Kayexalate once a day after conversation with PCP. States he has been taking 2 Tums with each meal as instructed. States he has been taking his sodium bicarb 2 tablets 3 times a day as prescribed. Feels well at this time, denies any symptoms. Hoping to avoid dialysis.   Has an appointment with nephrology tomorrow with Dr. Candiss Norse.  HTN [id number] systolic at home. Reports being compliant with his blood pressure medications. States his blood pressure was elevated today because he had just woken up and was in a rush to get here. Feels good, denies any symptoms.  PERTINENT  PMH / PSH: CKD4 (now CKD5), T2DM, HTN  OBJECTIVE:   BP (!) 160/84    Pulse 68    Ht 5\' 11"  (1.803 m)    Wt 207 lb (93.9 kg)    SpO2 97%    BMI 28.87 kg/m   General: Overweight male, NAD CV: RRR, no murmurs Pulm: CTAB, no wheezes or rales  ASSESSMENT/PLAN:   Chronic kidney disease, stage 5 (HCC) Previously CKD stage IV, now stage V, in the setting of HTN.  Renal functioning worsening, will likely the need dialysis.  Hyperkalemia and hyperphosphatemia on most recent renal function panel, recently started taking K binder.  Stressed the importance of taking his medications. Nephrology appointment tomorrow. - repeat RFP, plan to touch base with nephrology if significantly worse  Hypertension Elevated today in office 160/84, reports home readings typically in the 149F-026V systolic. Asymptomatic, not in hypertensive emergency. Reports compliance with medications. - continue current regimen, consider increasing carvedilol if remains elevated   F/u 2 weeks  Zola Button, MD [place]

## 2020-08-14 NOTE — Telephone Encounter (Signed)
[name]   Spoke to patient he is refusing to reschedule call with you he states "he has all of his medications and is taking them correctly and he has an upcoming appt on 11/10 at 830 with nephrologist."   I routed a message to PCP.  Thanks   [place] Management

## 2020-08-14 NOTE — Chronic Care Management (AMB) (Signed)
°  Care Management   Note  08/14/2020 Name: [name] MRN: [id number] DOB: Feb 22, 1966  [name] is a 54 y.o. year old male who is a primary care patient of Wilber Oliphant, MD and is actively engaged with the care management team. I reached out to [facility] by phone today to assist with re-scheduling an initial visit with the RN Case Manager.  Follow up plan: Patient declines further follow up and engagement by the care management team. Appropriate care team members and provider have been notified via electronic communication. The patient has been provided with contact information for the care management team and has been advised to call with any health related questions or concerns.   [place] Management  Direct Dial: [phone]

## 2020-08-15 ENCOUNTER — Other Ambulatory Visit: Payer: Self-pay

## 2020-08-15 ENCOUNTER — Encounter: Payer: Self-pay | Admitting: Family Medicine

## 2020-08-15 ENCOUNTER — Ambulatory Visit (INDEPENDENT_AMBULATORY_CARE_PROVIDER_SITE_OTHER): Payer: 59 | Admitting: Family Medicine

## 2020-08-15 VITALS — BP 160/84 | HR 68 | Ht 71.0 in | Wt 207.0 lb

## 2020-08-15 DIAGNOSIS — N185 Chronic kidney disease, stage 5: Secondary | ICD-10-CM | POA: Diagnosis not present

## 2020-08-15 DIAGNOSIS — I150 Renovascular hypertension: Secondary | ICD-10-CM | POA: Diagnosis not present

## 2020-08-15 NOTE — Assessment & Plan Note (Addendum)
Previously CKD stage IV, now stage V, in the setting of HTN.  Renal functioning worsening, will likely the need dialysis.  Hyperkalemia and hyperphosphatemia on most recent renal function panel, recently started taking K binder.  Stressed the importance of taking his medications. Nephrology appointment tomorrow. - repeat RFP, plan to touch base with nephrology if significantly worse

## 2020-08-15 NOTE — Assessment & Plan Note (Addendum)
Elevated today in office 160/84, reports home readings typically in the 244L-753Y systolic. Asymptomatic, not in hypertensive emergency. Reports compliance with medications. - continue current regimen, consider increasing carvedilol if remains elevated

## 2020-08-16 LAB — RENAL FUNCTION PANEL
Albumin: 3.2 g/dL — ABNORMAL LOW (ref 3.8–4.9)
BUN/Creatinine Ratio: 11 (ref 9–20)
BUN: 96 mg/dL (ref 6–24)
CO2: 19 mmol/L — ABNORMAL LOW (ref 20–29)
Calcium: 7.5 mg/dL — ABNORMAL LOW (ref 8.7–10.2)
Chloride: 108 mmol/L — ABNORMAL HIGH (ref 96–106)
Creatinine, Ser: 8.8 mg/dL — ABNORMAL HIGH (ref 0.76–1.27)
GFR calc Af Amer: 7 mL/min/{1.73_m2} — ABNORMAL LOW (ref >59)
GFR calc non Af Amer: 6 mL/min/{1.73_m2} — ABNORMAL LOW (ref >59)
Glucose: 100 mg/dL — ABNORMAL HIGH (ref 65–99)
Phosphorus: 7.5 mg/dL — ABNORMAL HIGH (ref 2.8–4.1)
Potassium: 5.8 mmol/L — ABNORMAL HIGH (ref 3.5–5.2)
Sodium: 144 mmol/L (ref 134–144)

## 2020-08-25 ENCOUNTER — Other Ambulatory Visit (HOSPITAL_COMMUNITY): Payer: Self-pay | Admitting: Nephrology

## 2020-08-28 ENCOUNTER — Emergency Department (HOSPITAL_COMMUNITY): Payer: 59

## 2020-08-28 ENCOUNTER — Other Ambulatory Visit (HOSPITAL_COMMUNITY): Payer: Self-pay | Admitting: Nephrology

## 2020-08-28 ENCOUNTER — Encounter (HOSPITAL_COMMUNITY): Payer: Self-pay | Admitting: *Deleted

## 2020-08-28 ENCOUNTER — Other Ambulatory Visit: Payer: Self-pay

## 2020-08-28 ENCOUNTER — Emergency Department (HOSPITAL_COMMUNITY)
Admission: EM | Admit: 2020-08-28 | Discharge: 2020-08-28 | Disposition: A | Discharge status: Home / Self Care | Payer: 59 | Attending: Emergency Medicine | Admitting: Emergency Medicine

## 2020-08-28 DIAGNOSIS — Z91040 Latex allergy status: Secondary | ICD-10-CM | POA: Diagnosis not present

## 2020-08-28 DIAGNOSIS — E8770 Fluid overload, unspecified: Secondary | ICD-10-CM

## 2020-08-28 DIAGNOSIS — I120 Hypertensive chronic kidney disease with stage 5 chronic kidney disease or end stage renal disease: Secondary | ICD-10-CM | POA: Diagnosis not present

## 2020-08-28 DIAGNOSIS — E872 Acidosis, unspecified: Secondary | ICD-10-CM

## 2020-08-28 DIAGNOSIS — M545 Low back pain, unspecified: Secondary | ICD-10-CM | POA: Diagnosis not present

## 2020-08-28 DIAGNOSIS — N185 Chronic kidney disease, stage 5: Secondary | ICD-10-CM | POA: Insufficient documentation

## 2020-08-28 DIAGNOSIS — R110 Nausea: Secondary | ICD-10-CM | POA: Diagnosis not present

## 2020-08-28 DIAGNOSIS — E861 Hypovolemia: Secondary | ICD-10-CM | POA: Diagnosis not present

## 2020-08-28 DIAGNOSIS — E1122 Type 2 diabetes mellitus with diabetic chronic kidney disease: Secondary | ICD-10-CM | POA: Insufficient documentation

## 2020-08-28 DIAGNOSIS — Z79899 Other long term (current) drug therapy: Secondary | ICD-10-CM | POA: Insufficient documentation

## 2020-08-28 DIAGNOSIS — N186 End stage renal disease: Secondary | ICD-10-CM

## 2020-08-28 DIAGNOSIS — Z87891 Personal history of nicotine dependence: Secondary | ICD-10-CM | POA: Insufficient documentation

## 2020-08-28 DIAGNOSIS — R2243 Localized swelling, mass and lump, lower limb, bilateral: Secondary | ICD-10-CM | POA: Diagnosis present

## 2020-08-28 LAB — CBC
HCT: 25.8 % — ABNORMAL LOW (ref 39.0–52.0)
Hemoglobin: 8.2 g/dL — ABNORMAL LOW (ref 13.0–17.0)
MCH: 32.5 pg (ref 26.0–34.0)
MCHC: 31.8 g/dL (ref 30.0–36.0)
MCV: 102.4 fL — ABNORMAL HIGH (ref 80.0–100.0)
Platelets: 155 10*3/uL (ref 150–400)
RBC: 2.52 MIL/uL — ABNORMAL LOW (ref 4.22–5.81)
RDW: 13.5 % (ref 11.5–15.5)
WBC: 7.6 10*3/uL (ref 4.0–10.5)
nRBC: 0 % (ref 0.0–0.2)

## 2020-08-28 LAB — URINALYSIS, ROUTINE W REFLEX MICROSCOPIC
Bilirubin Urine: NEGATIVE
Glucose, UA: 50 mg/dL — AB
Ketones, ur: NEGATIVE mg/dL
Leukocytes,Ua: NEGATIVE
Nitrite: NEGATIVE
Protein, ur: >=300 mg/dL — AB
Specific Gravity, Urine: 1.011 (ref 1.005–1.030)
pH: 5 (ref 5.0–8.0)

## 2020-08-28 LAB — BASIC METABOLIC PANEL
Anion gap: 16 — ABNORMAL HIGH (ref 5–15)
BUN: 113 mg/dL — ABNORMAL HIGH (ref 6–20)
CO2: 15 mmol/L — ABNORMAL LOW (ref 22–32)
Calcium: 7.5 mg/dL — ABNORMAL LOW (ref 8.9–10.3)
Chloride: 110 mmol/L (ref 98–111)
Creatinine, Ser: 8.84 mg/dL — ABNORMAL HIGH (ref 0.61–1.24)
GFR, Estimated: 7 mL/min — ABNORMAL LOW (ref >60)
Glucose, Bld: 118 mg/dL — ABNORMAL HIGH (ref 70–99)
Potassium: 4.1 mmol/L (ref 3.5–5.1)
Sodium: 141 mmol/L (ref 135–145)

## 2020-08-28 MED ORDER — FUROSEMIDE 10 MG/ML IJ SOLN
40.0000 mg | Freq: Once | INTRAMUSCULAR | Status: AC
  Administered 2020-08-28: 40 mg via INTRAVENOUS
  Filled 2020-08-28: qty 4

## 2020-08-28 NOTE — ED Notes (Signed)
ED Provider at bedside. 

## 2020-08-28 NOTE — ED Triage Notes (Signed)
Pt called out EMS from a hotel, recent dx stage 5 kidney failure. Tonight, started having tremors, c/o leg pain and lower back pain around his kidney area. Lower extremity edema. 142/92, hr 65, 98% RA, CBG 160. Onset of diarrhea en route.

## 2020-08-28 NOTE — ED Notes (Signed)
Pt ambulated in hallway, saturations maintained 99-100%

## 2020-08-28 NOTE — ED Notes (Signed)
Pt given both verbal and written d/c instructions. Verbalized understanding of the same, pt taken to POV via w/c.

## 2020-08-28 NOTE — ED Notes (Signed)
Pt says he is "deathly allergic to the heart monitor stickers" and unable to wear them.

## 2020-08-28 NOTE — ED Triage Notes (Signed)
Pt says that today he has felt very weak, had more swelling in his legs and left flank pain. Denies fevers. Reports he is "waiting for the call" from nephrology to come in the office to see them. States he is compliant with his medications. Onset of diarrhea just about 45 minutes ago.

## 2020-08-28 NOTE — ED Notes (Signed)
Pt taken to bathroom in w/c on arrival to treatment room with c/o diarrhea.

## 2020-08-28 NOTE — Discharge Instructions (Addendum)
Thank you for allowing me to care for you today in the Emergency Department.   You were seen today in the ER for weakness, leg swelling, shortness of breath, and back pain.  Your work-up was consistent with volume overload, likely from your kidneys.  However, we discussed your labs with the kidney doctor (nephrologist) who felt you are safe for discharge to home.  There is 1 change to your medication:  -Fureosemide (the water pill) -- Start taking 2 tablets by mouths twice a day.  (You have been taking one pill two times daily.)  Keep taking this dose until nephrology tells you to stop.  Primary care will see you in the office on Monday 11/29 at 2:30 PM.   Return to the emergency department if you develop worsening shortness of breath, if you pass out, if you start having chest pain, fevers, uncontrollable vomiting, new numbness or weakness, if you stop producing urine, or other new, concerning symptoms.

## 2020-08-28 NOTE — ED Provider Notes (Signed)
[facility] EMERGENCY DEPARTMENT Provider Note   CSN: [id number] Arrival date & time: 08/28/20  0035     History Chief Complaint  Patient presents with  . Leg Swelling    [name] is a 54 y.o. male with a history of CKD stage IV, HTN, HLD, diabetes mellitus type 2 who presents to the emergency department with a chief complaint of weakness.  The patient reports constant, worsening weakness today accompanied by swelling in his bilateral legs, shortness of breath, nausea, and bilateral low back pain, left greater than right, and chills.  He does note that he has had a couple of episodes of loose stools tonight.  He reports that he was at his son's wedding today and was trying to wait until tomorrow so he can follow-up with his PCP, but his symptoms continue to worsen and his wife prompted him to come to the ER for evaluation.  He denies chest pain, abdominal or groin swelling, metallic taste in the mouth, fever, cough, nasal congestion, rhinorrhea, sore throat, abdominal pain, vomiting, diarrhea, constipation, dysuria, hematuria, numbness, headache, or neck pain.  Patient has seen by Dr. Candiss Norse with nephrology.  He last spoke to their office 2 days ago and was advised that he needs to go to the emergency department if he develops any new or worsening symptoms.  He is currently awaiting placement for a hemodialysis catheter and awaiting referral to vascular surgery.  The history is provided by the patient and medical records. No language interpreter was used.       Past Medical History:  Diagnosis Date  . Allergy   . CKD (chronic kidney disease), stage IV ([place])   . Diabetes mellitus    Type 2 DM, Patient reports DM Type 1 diagnosd age at age 88  . Diabetic foot ulcers ([place])   . Foreign body in left foot    with  infection  . Hypertension   . Meniscus tear   . Multiple rib fractures   . Personal history of diabetic foot ulcer, Left Foot     Patient  Active Problem List   Diagnosis Date Noted  . Hypoglycemia 08/03/2020  . Diarrhea 08/02/2020  . Hypertensive emergency 07/27/2020  . Nephrotic range proteinuria 07/27/2020  . Acute stress reaction 07/25/2020  . Chronic kidney disease, stage 5 ([place]) 03/07/2020  . Hematuria 03/07/2020  . Foot callus 07/01/2018  . Hyperlipidemia 05/28/2016  . Hypertriglyceridemia 05/28/2016  . AKI (acute kidney injury) ([place]) 10/09/2012  . Type 2 diabetes mellitus with stage 4 chronic kidney disease, without long-term current use of insulin ([place]) 06/01/2012  . Hypertension 04/14/2012  . DM (diabetes mellitus), type 2, uncontrolled ([place]) 04/13/2012    Past Surgical History:  Procedure Laterality Date  . EYE SURGERY    . I & D EXTREMITY  04/23/2012   Procedure: IRRIGATION AND DEBRIDEMENT EXTREMITY;  Surgeon: Newt Minion, MD;  Location: [facility];  Service: Orthopedics;  Laterality: Left;  Irrigation and Debridement Left Foot  . I & D EXTREMITY  09/18/2012   Procedure: IRRIGATION AND DEBRIDEMENT EXTREMITY;  Surgeon: Newt Minion, MD;  Location: [facility];  Service: Orthopedics;  Laterality: Left;  . MENISCUS REPAIR Left 04/2016   Orthopedist, Dr [name] in [place], [place]       Family History  Problem Relation Age of Onset  . Diabetes type II Mother   . Arthritis Mother   . Diabetes Mother   . Hyperlipidemia Mother   . Hypertension Mother   .  Diabetes type II Sister   . Diabetes Father     Social History   Tobacco Use  . Smoking status: Former Smoker    Types: Cigarettes    Quit date: 09/03/2012    Years since quitting: 7.9  . Smokeless tobacco: Former Systems developer    Quit date: 09/03/2012  Substance Use Topics  . Alcohol use: Yes    Alcohol/week: 8.0 standard drinks    Types: 6 Cans of beer, 2 Shots of liquor per week    Comment: weekends.  . Drug use: No    Home Medications Prior to Admission medications   Medication Sig Start Date End Date Taking? Authorizing Provider  acetaminophen  (TYLENOL) 325 MG tablet Take 325 mg by mouth every 6 (six) hours as needed for mild pain or headache.    [provider]  amLODipine (NORVASC) 10 MG tablet Take 1 tablet (10 mg total) by mouth at bedtime. 08/02/20   Wilber Oliphant, MD  Blood Glucose Monitoring Suppl (BLOOD GLUCOSE MONITOR SYSTEM) w/Device KIT 1 kit by Does not apply route daily. 02/22/20   Wilber Oliphant, MD  carvedilol (COREG) 12.5 MG tablet Take 1 tablet (12.5 mg total) by mouth 2 (two) times daily with a meal. 07/29/20 08/28/20  Benay Pike, MD  hydrALAZINE (APRESOLINE) 25 MG tablet Take 1.5 tablets (37.5 mg total) by mouth 3 (three) times daily. 07/29/20 08/28/20  Benay Pike, MD  sevelamer carbonate (RENVELA) 800 MG tablet Take 1 tablet (800 mg total) by mouth 3 (three) times daily with meals. Patient not taking: Reported on 08/15/2020 07/29/20 08/28/20  Benay Pike, MD  sodium bicarbonate 650 MG tablet Take 2 tablets (1,300 mg total) by mouth 3 (three) times daily. 07/29/20 08/28/20  Benay Pike, MD  sodium polystyrene (KAYEXALATE) 15 GM/60ML suspension Take 60 mLs (15 g total) by mouth daily. 08/05/20   Brimage, Ronnette Juniper, DO  terbinafine (LAMISIL) 1 % cream Apply 1 application topically 2 (two) times daily. For 1 week to arms Patient not taking: Reported on 08/15/2020 07/25/20   Martyn Malay, MD    Allergies    Latex and Ace inhibitors  Review of Systems   Review of Systems  Constitutional: Negative for appetite change, chills and fever.  HENT: Negative for congestion and sore throat.   Eyes: Negative for visual disturbance.  Respiratory: Positive for shortness of breath. Negative for cough.   Cardiovascular: Positive for leg swelling. Negative for chest pain and palpitations.  Gastrointestinal: Positive for nausea. Negative for abdominal pain, blood in stool, constipation, diarrhea and vomiting.  Genitourinary: Negative for dysuria.  Musculoskeletal: Positive for back pain. Negative for  arthralgias, gait problem, myalgias, neck pain and neck stiffness.  Skin: Negative for rash and wound.  Allergic/Immunologic: Negative for immunocompromised state.  Neurological: Negative for seizures, syncope, weakness and headaches.  Psychiatric/Behavioral: Negative for confusion.    Physical Exam Updated Vital Signs BP 132/66   Pulse 62   Temp 98.3 F (36.8 C) (Temporal)   Resp 15   Ht 5' 11"  (1.803 m)   Wt 94.3 kg   SpO2 100%   BMI 29.01 kg/m   Physical Exam Vitals and nursing note reviewed.  Constitutional:      General: He is not in acute distress.    Appearance: He is well-developed. He is not ill-appearing, toxic-appearing or diaphoretic.  HENT:     Head: Normocephalic.     Nose: Nose normal.     Mouth/Throat:  Mouth: Mucous membranes are moist.     Pharynx: No oropharyngeal exudate or posterior oropharyngeal erythema.  Eyes:     Extraocular Movements: Extraocular movements intact.     Conjunctiva/sclera: Conjunctivae normal.     Pupils: Pupils are equal, round, and reactive to light.  Cardiovascular:     Rate and Rhythm: Normal rate and regular rhythm.     Pulses: Normal pulses.     Heart sounds: Normal heart sounds. No murmur heard.  No friction rub. No gallop.   Pulmonary:     Effort: Pulmonary effort is normal. No respiratory distress.     Breath sounds: No stridor. No wheezing, rhonchi or rales.     Comments: Crackles noted in the bilateral bases.  No tachypnea or increased work of breathing.  Patient is able to speak in complete, fluent sentences. Chest:     Chest wall: No tenderness.  Abdominal:     General: There is no distension.     Palpations: Abdomen is soft. There is no mass.     Tenderness: There is no abdominal tenderness. There is left CVA tenderness. There is no right CVA tenderness, guarding or rebound.     Hernia: No hernia is present.     Comments: Left CVA tenderness.  No right CVA tenderness.  Abdomen is otherwise soft, nontender,  nondistended.  Normoactive bowel sounds.  Musculoskeletal:        General: No swelling or tenderness.     Cervical back: Normal range of motion and neck supple.     Right lower leg: Edema present.     Left lower leg: Edema present.     Comments: 2+ pitting edema to bilateral lower extremities.  Skin:    General: Skin is warm and dry.     Capillary Refill: Capillary refill takes less than 2 seconds.  Neurological:     Mental Status: He is alert.  Psychiatric:        Behavior: Behavior normal.     ED Results / Procedures / Treatments   Labs (all labs ordered are listed, but only abnormal results are displayed) Labs Reviewed  BASIC METABOLIC PANEL - Abnormal; Notable for the following components:      Result Value   CO2 15 (*)    Glucose, Bld 118 (*)    BUN 113 (*)    Creatinine, Ser 8.84 (*)    Calcium 7.5 (*)    GFR, Estimated 7 (*)    Anion gap 16 (*)    All other components within normal limits  CBC - Abnormal; Notable for the following components:   RBC 2.52 (*)    Hemoglobin 8.2 (*)    HCT 25.8 (*)    MCV 102.4 (*)    All other components within normal limits  URINALYSIS, ROUTINE W REFLEX MICROSCOPIC - Abnormal; Notable for the following components:   APPearance HAZY (*)    Glucose, UA 50 (*)    Hgb urine dipstick SMALL (*)    Protein, ur >=300 (*)    Bacteria, UA RARE (*)    All other components within normal limits    EKG EKG Interpretation  Date/Time:  Monday August 28 2020 00:52:09 EST Ventricular Rate:  64 PR Interval:    QRS Duration: 109 QT Interval:  455 QTC Calculation: 470 R Axis:   -19 Text Interpretation: Sinus rhythm Probable left atrial enlargement Borderline left axis deviation Anteroseptal infarct, age indeterminate No significant change since last tracing Confirmed by Ward, Cyril Mourning [id number]) on  08/28/2020 2:27:00 AM   Radiology DG Chest Portable 1 View  Result Date: 08/28/2020 CLINICAL DATA:  Shortness of breath.  Leg swelling. EXAM:  PORTABLE CHEST 1 VIEW COMPARISON:  07/15/2016 FINDINGS: There is mild cardiomegaly. There is mild vascular congestion without pulmonary edema. There is no pneumothorax or large pleural effusion. No focal infiltrate. IMPRESSION: Mild cardiomegaly with mild vascular congestion. Electronically Signed   By: Constance Holster M.D.   On: 08/28/2020 01:38    Procedures Procedures (including critical care time)  Medications Ordered in ED Medications  furosemide (LASIX) injection 40 mg (40 mg Intravenous Given 08/28/20 0403)    ED Course  I have reviewed the triage vital signs and the nursing notes.  Pertinent labs & imaging results that were available during my care of the patient were reviewed by me and considered in my medical decision making (see chart for details).  Clinical Course as of Aug 28 441  Mon Aug 28, 2020  1610 Spoke with Dr. Jonnie Finner, nephrology, regarding changes in patient's clinical status and labs.  He recommends increasing his home Lasix to 80 mg twice daily and to call the nephrology office tomorrow.  We will give 40 IV Lasix since the patient has had his nighttime dose of 40 mg.  Will place consult to family medicine residency team to help coordinate the patient's outpatient follow-up.   [MM]  [phone] Spoke with Dr. Darrelyn Hillock, family medicine residency.  She has scheduled him a follow-up appointment in the clinic on 11:29 at 2:30 PM.  His appointment for 1122 has been canceled.  The patient also informing that he has an appointment with Dr. Candiss Norse with nephrology on 11/24 for repeat labs.    [MM]    Clinical Course User Index [MM] Shahed Yeoman, Laymond Purser, PA-C   MDM Rules/Calculators/A&P                          54 year old male with a history of CKD stage IV, HTN, HLD, diabetes mellitus type 2.   This is a 54 y.o. male who presents to the ED for concern of  shortness of breath, leg swelling, back pain, and chills.   This involves an extensive number of treatment options,  and is a complaint that carries with it a high risk of complications and morbidity.  The differential diagnosis includes hypervolemia, pyelonephritis, UTI, obstructive uropathy, COVID-19.   Patient was discussed with Dr. Leonides Schanz, attending physician.  Vitals and Exam:    Vitals are stable  Left CVA tenderness.  Abdomen is otherwise benign.  Crackles in the bilateral basis and 2+ pitting edema bilaterally.  No increased work of breathing or tachypnea.   Lab Tests:    I ordered, reviewed, and interpreted labs, which included  CBC, BMP that showed elevated creatinine, stable from previous secondary to CKD stage IV.  He has a metabolic acidosis that is somewhat worse than previous.  Potassium is normal.  Imaging Studies ordered:    I ordered imaging studies which included CXR  I independently visualized and interpreted imaging which showed no acute findings   Additional history obtained:    Previous records obtained and reviewed  Medicines ordered:    I ordered 40 mg of IV Lasix   Consultations Obtained:    I consulted nephrology. Dr Jonnie Finner recommended having the patient double his home Lasix and call the office tomorrow for follow-up.  He will increase his home Lasix to 80 mg twice daily.  I  consulted family medicine and spoke with Dr. Higinio Plan to close the communication loop with the patient's care team regarding follow-up.  They have scheduled him an outpatient appointment in 1 week.   Reevaluation:   After the interventions stated above, I reevaluated the patient and found improved  Plan and Disposition:   Patient with volume overload secondary to CKD stage IV.  He does have a metabolic acidosis, but this is only minimally increased from previous.  No hyperkalemia.  He is having no chest pain.  EKG unchanged from previous.  Nephrology has recommended to double his home Lasix dose and have him follow-up in the clinic since he has no hypoxia, tachypnea, or significant metabolic  derangements.  Patient and his wife are in agreement with this plan.  IV Lasix given that he was ambulated in the department with no hypoxia.  He has good follow-up with nephrology and primary care.  ER return precautions given.  He is hemodynamically stable and in no acute distress.  Safe for discharge home with outpatient follow-up as indicated.   Final Clinical Impression(s) / ED Diagnoses Final diagnoses:  Hypervolemia, unspecified hypervolemia type  Metabolic acidosis    Rx / DC Orders ED Discharge Orders    None       Joanne Gavel, PA-C 08/28/20 [place], Delice Bison, DO 08/28/20 [phone]

## 2020-08-29 ENCOUNTER — Ambulatory Visit: Payer: 59 | Admitting: Family Medicine

## 2020-08-29 ENCOUNTER — Other Ambulatory Visit: Payer: Self-pay | Admitting: Radiology

## 2020-08-29 ENCOUNTER — Other Ambulatory Visit: Payer: Self-pay | Admitting: Student

## 2020-08-30 ENCOUNTER — Other Ambulatory Visit: Payer: Self-pay

## 2020-08-30 ENCOUNTER — Ambulatory Visit (HOSPITAL_COMMUNITY)
Admission: RE | Admit: 2020-08-30 | Discharge: 2020-08-30 | Disposition: A | Discharge status: Home / Self Care | Payer: 59 | Source: Ambulatory Visit | Attending: Nephrology | Admitting: Nephrology

## 2020-08-30 DIAGNOSIS — E1022 Type 1 diabetes mellitus with diabetic chronic kidney disease: Secondary | ICD-10-CM | POA: Diagnosis not present

## 2020-08-30 DIAGNOSIS — N185 Chronic kidney disease, stage 5: Secondary | ICD-10-CM | POA: Diagnosis not present

## 2020-08-30 DIAGNOSIS — E785 Hyperlipidemia, unspecified: Secondary | ICD-10-CM | POA: Insufficient documentation

## 2020-08-30 DIAGNOSIS — N186 End stage renal disease: Secondary | ICD-10-CM

## 2020-08-30 DIAGNOSIS — Z79899 Other long term (current) drug therapy: Secondary | ICD-10-CM | POA: Insufficient documentation

## 2020-08-30 DIAGNOSIS — I120 Hypertensive chronic kidney disease with stage 5 chronic kidney disease or end stage renal disease: Secondary | ICD-10-CM | POA: Insufficient documentation

## 2020-08-30 DIAGNOSIS — Z87891 Personal history of nicotine dependence: Secondary | ICD-10-CM | POA: Diagnosis not present

## 2020-08-30 DIAGNOSIS — Z992 Dependence on renal dialysis: Secondary | ICD-10-CM

## 2020-08-30 HISTORY — PX: IR PERC TUN PERIT CATH WO PORT S&I /IMAG: IMG2327

## 2020-08-30 HISTORY — PX: IR US GUIDE VASC ACCESS RIGHT: IMG2390

## 2020-08-30 LAB — GLUCOSE, CAPILLARY: Glucose-Capillary: 73 mg/dL (ref 70–99)

## 2020-08-30 MED ORDER — HEPARIN SODIUM (PORCINE) 1000 UNIT/ML IJ SOLN
INTRAMUSCULAR | Status: AC
  Filled 2020-08-30: qty 1

## 2020-08-30 MED ORDER — FENTANYL CITRATE (PF) 100 MCG/2ML IJ SOLN
INTRAMUSCULAR | Status: AC
  Filled 2020-08-30: qty 2

## 2020-08-30 MED ORDER — LIDOCAINE-EPINEPHRINE 1 %-1:100000 IJ SOLN
INTRAMUSCULAR | Status: AC
  Filled 2020-08-30: qty 1

## 2020-08-30 MED ORDER — LIDOCAINE-EPINEPHRINE 1 %-1:100000 IJ SOLN
INTRAMUSCULAR | Status: AC | PRN
  Administered 2020-08-30: 20 mL

## 2020-08-30 MED ORDER — MIDAZOLAM HCL 2 MG/2ML IJ SOLN
INTRAMUSCULAR | Status: AC | PRN
  Administered 2020-08-30: 1 mg via INTRAVENOUS

## 2020-08-30 MED ORDER — CEFAZOLIN SODIUM-DEXTROSE 2-4 GM/100ML-% IV SOLN: 2.0000 g | Freq: Once | INTRAVENOUS | Status: AC

## 2020-08-30 MED ORDER — SODIUM CHLORIDE 0.9 % IV SOLN: INTRAVENOUS | Status: DC

## 2020-08-30 MED ORDER — HEPARIN SODIUM (PORCINE) 1000 UNIT/ML IJ SOLN
INTRAMUSCULAR | Status: AC | PRN
  Administered 2020-08-30: 1000 [IU]

## 2020-08-30 MED ORDER — CEFAZOLIN SODIUM-DEXTROSE 2-4 GM/100ML-% IV SOLN
INTRAVENOUS | Status: AC
  Administered 2020-08-30: 2 g via INTRAVENOUS
  Filled 2020-08-30: qty 100

## 2020-08-30 MED ORDER — FENTANYL CITRATE (PF) 100 MCG/2ML IJ SOLN
INTRAMUSCULAR | Status: AC | PRN
Start: 2020-08-30 — End: 2020-08-30
  Administered 2020-08-30: 50 ug via INTRAVENOUS

## 2020-08-30 MED ORDER — MIDAZOLAM HCL 2 MG/2ML IJ SOLN
INTRAMUSCULAR | Status: AC
  Filled 2020-08-30: qty 2

## 2020-08-30 NOTE — Procedures (Signed)
  Procedure: Tunneled R IJ HD CVC  19cm EBL:   minimal Complications:  none immediate  See full dictation in [facility].  Dillard Cannon MD Main # [phone] Pager  [phone] Mobile [phone]

## 2020-08-30 NOTE — H&P (Signed)
Chief Complaint: Patient was seen in consultation today for tunneled HD catheter placement.  Referring Physician(s): Singh,Vikas  Supervising Physician: Arne Cleveland  Patient Status: [facility] - Out-pt  History of Present Illness: [name] is a 54 y.o. male with a past medical history significant for HTN, HLD, DM and CKD V who presents today for a tunneled HD catheter placement to begin HD. [name] has been closely followed by his PCP and nephrology as an outpatient for worsening CKD. He presented to the ED on 11/22 in a volume overloaded state for which he was treated and instructed to follow up with his nephrologist. After further discussion with nephrology the decision was made to proceed with initiation of HD - he has been referred to IR for tunneled HD catheter placement today.  [name] denies any acute complaints today, however he is very hungry and is looking forward to eating. He is wondering if he will need to change his diet/medications once he begins HD. He is also wondering when his first HD treatment is scheduled, he knows that he will be attending HD in [facility] but is not sure when that will begin - he and his wife plan to follow up with his nephrologists office after the procedure today to clarify that. He is also wondering about care of the catheter after it is placed. He understands the procedure today and is agreeable to proceed. I also discussed the procedure with his wife [name] via phone today.  Past Medical History:  Diagnosis Date  . Allergy   . CKD (chronic kidney disease), stage IV ([place])   . Diabetes mellitus    Type 2 DM, Patient reports DM Type 1 diagnosd age at age 66  . Diabetic foot ulcers ([place])   . Foreign body in left foot    with  infection  . Hypertension   . Meniscus tear   . Multiple rib fractures   . Personal history of diabetic foot ulcer, Left Foot     Past Surgical History:  Procedure Laterality Date  . EYE SURGERY    . I & D  EXTREMITY  04/23/2012   Procedure: IRRIGATION AND DEBRIDEMENT EXTREMITY;  Surgeon: Newt Minion, MD;  Location: [facility];  Service: Orthopedics;  Laterality: Left;  Irrigation and Debridement Left Foot  . I & D EXTREMITY  09/18/2012   Procedure: IRRIGATION AND DEBRIDEMENT EXTREMITY;  Surgeon: Newt Minion, MD;  Location: [facility];  Service: Orthopedics;  Laterality: Left;  . MENISCUS REPAIR Left 04/2016   Orthopedist, Dr [name] in [place], [place]    Allergies: Latex and Ace inhibitors  Medications: Prior to Admission medications   Medication Sig Start Date End Date Taking? Authorizing Provider  acetaminophen (TYLENOL) 325 MG tablet Take 650 mg by mouth every 6 (six) hours as needed for mild pain or headache.    Yes [provider]  amLODipine (NORVASC) 10 MG tablet Take 1 tablet (10 mg total) by mouth at bedtime. 08/02/20  Yes Wilber Oliphant, MD  carvedilol (COREG) 12.5 MG tablet Take 1 tablet (12.5 mg total) by mouth 2 (two) times daily with a meal. 07/29/20 08/30/20 Yes Benay Pike, MD  furosemide (LASIX) 40 MG tablet Take 80 mg by mouth 2 (two) times daily.  08/23/20  Yes [provider]  hydrALAZINE (APRESOLINE) 50 MG tablet Take 50 mg by mouth 3 (three) times daily.   Yes [provider]  LOKELMA 10 g PACK packet Take 10 g by  mouth in the morning and at bedtime. 08/17/20  Yes [provider]  sodium bicarbonate 650 MG tablet Take 2 tablets (1,300 mg total) by mouth 3 (three) times daily. 07/29/20 08/30/20 Yes Benay Pike, MD  Blood Glucose Monitoring Suppl (BLOOD GLUCOSE MONITOR SYSTEM) w/Device KIT 1 kit by Does not apply route daily. 02/22/20   Wilber Oliphant, MD  hydrALAZINE (APRESOLINE) 25 MG tablet Take 1.5 tablets (37.5 mg total) by mouth 3 (three) times daily. Patient not taking: Reported on 08/29/2020 07/29/20 08/28/20  Benay Pike, MD  sodium polystyrene (KAYEXALATE) 15 GM/60ML suspension Take 60 mLs (15 g total) by mouth daily. Patient  not taking: Reported on 08/29/2020 08/05/20   Lyndee Hensen, DO  terbinafine (LAMISIL) 1 % cream Apply 1 application topically 2 (two) times daily. For 1 week to arms Patient not taking: Reported on 08/15/2020 07/25/20   Martyn Malay, MD     Family History  Problem Relation Age of Onset  . Diabetes type II Mother   . Arthritis Mother   . Diabetes Mother   . Hyperlipidemia Mother   . Hypertension Mother   . Diabetes type II Sister   . Diabetes Father     Social History   Socioeconomic History  . Marital status: Married    Spouse name: Not on file  . Number of children: Not on file  . Years of education: Not on file  . Highest education level: Not on file  Occupational History  . Not on file  Tobacco Use  . Smoking status: Former Smoker    Types: Cigarettes    Quit date: 09/03/2012    Years since quitting: 7.9  . Smokeless tobacco: Former Systems developer    Quit date: 09/03/2012  Substance and Sexual Activity  . Alcohol use: Yes    Alcohol/week: 8.0 standard drinks    Types: 6 Cans of beer, 2 Shots of liquor per week    Comment: weekends.  . Drug use: No  . Sexual activity: Yes  Other Topics Concern  . 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    Social Determinants of Health   Financial Resource Strain:   . Difficulty of Paying Living Expenses: Not on file  Food Insecurity:   . Worried About Charity fundraiser in the Last Year: Not on file  . Ran Out of Food in the Last Year: Not on file  Transportation Needs:   . Lack of Transportation (Medical): Not on file  . Lack of Transportation (Non-Medical): Not on file  Physical Activity:   . Days of Exercise per Week: Not on file  . Minutes of Exercise per Session: Not on file  Stress:   . Feeling of Stress : Not on file  Social Connections:   . Frequency of Communication with Friends and Family: Not on file  . Frequency of Social Gatherings with  Friends and Family: Not on file  . Attends Religious Services: Not on file  . Active Member of Clubs or Organizations: Not on file  . Attends Archivist Meetings: Not on file  . Marital Status: Not on file     Review of Systems: A 12 point ROS discussed and pertinent positives are indicated in the HPI above.  All other systems are negative.  Review of Systems  Constitutional: Negative for chills and fever.  Respiratory: Negative for cough and shortness of breath.  Cardiovascular: Positive for leg swelling. Negative for chest pain.  Gastrointestinal: Negative for abdominal pain, nausea and vomiting.  Neurological: Negative for headaches.    Vital Signs: BP (!) 158/79   Pulse 70   Temp (!) 97.4 F (36.3 C) (Oral)   Resp 18   Ht 5' 11"  (1.803 m)   Wt 208 lb (94.3 kg)   SpO2 100%   BMI 29.01 kg/m   Physical Exam Vitals reviewed.  Constitutional:      General: He is not in acute distress. HENT:     Head: Normocephalic.     Mouth/Throat:     Mouth: Mucous membranes are moist.     Pharynx: Oropharynx is clear. No oropharyngeal exudate or posterior oropharyngeal erythema.  Cardiovascular:     Rate and Rhythm: Normal rate and regular rhythm.  Pulmonary:     Effort: Pulmonary effort is normal.     Breath sounds: Normal breath sounds.  Abdominal:     General: There is no distension.     Palpations: Abdomen is soft.     Tenderness: There is no abdominal tenderness.  Skin:    General: Skin is warm and dry.  Neurological:     Mental Status: He is alert and oriented to person, place, and time.  Psychiatric:        Mood and Affect: Mood normal.        Behavior: Behavior normal.        Thought Content: Thought content normal.        Judgment: Judgment normal.      MD Evaluation Airway: WNL Heart: WNL Abdomen: WNL Chest/ Lungs: WNL ASA  Classification: 3 Mallampati/Airway Score: Two   Imaging: DG Chest Portable 1 View  Result Date:  08/28/2020 CLINICAL DATA:  Shortness of breath.  Leg swelling. EXAM: PORTABLE CHEST 1 VIEW COMPARISON:  07/15/2016 FINDINGS: There is mild cardiomegaly. There is mild vascular congestion without pulmonary edema. There is no pneumothorax or large pleural effusion. No focal infiltrate. IMPRESSION: Mild cardiomegaly with mild vascular congestion. Electronically Signed   By: Constance Holster M.D.   On: 08/28/2020 01:38    Labs:  CBC: Recent Labs    08/03/20 1533 08/03/20 1533 08/03/20 1541 08/04/20 0238 08/10/20 0855 08/28/20 0052  WBC 10.6*  --   --  12.1* 9.3 7.6  HGB 9.1*   < > 8.5* 8.6* 9.0* 8.2*  HCT 29.0*   < > 25.0* 26.9* 27.9* 25.8*  PLT 157  --   --  158 253 155   < > = values in this interval not displayed.    COAGS: No results for input(s): INR, APTT in the last 8760 hours.  BMP: Recent Labs    07/25/20 1559 07/26/20 1441 08/02/20 1627 08/03/20 1533 08/05/20 0147 08/10/20 0855 08/15/20 1524 08/28/20 0052  NA 143   < > 140   < > 135 141 144 141  K 5.4*   < > 4.8   < > 5.0 5.8* 5.8* 4.1  CL 115*   < > 107*   < > 106 106 108* 110  CO2 15*   < > 18*   < > 17* 17* 19* 15*  GLUCOSE 80   < > 100*   < > 102* 79 100* 118*  BUN 63*   < > 70*   < > 81* 109* 96* 113*  CALCIUM 7.9*   < > 7.9*   < > 7.6* 7.0* 7.5* 7.5*  CREATININE 6.77*   < >  7.07*   < > 7.37* 9.95* 8.80* 8.84*  GFRNONAA 9*   < > 8*   < > 8* 5* 6* 7*  GFRAA 10*  --  9*  --   --  6* 7*  --    < > = values in this interval not displayed.    LIVER FUNCTION TESTS: Recent Labs    02/22/20 1726 07/25/20 1559 07/26/20 1441 07/27/20 0319 08/03/20 1533 08/05/20 0147 08/10/20 0855 08/15/20 1524  BILITOT 0.2  --  0.4  --  0.4  --   --   --   AST 18  --  18  --  18  --   --   --   ALT 14  --  17  --  18  --   --   --   ALKPHOS 110  --  69  --  90  --   --   --   PROT 5.5*  --  6.4*  --  5.9*  --   --   --   ALBUMIN 2.9*   < > 3.0*   < > 2.5* 2.4* 3.3* 3.2*   < > = values in this interval not  displayed.    TUMOR MARKERS: No results for input(s): AFPTM, CEA, CA199, CHROMGRNA in the last 8760 hours.  Assessment and Plan:  54 y/o M with history or CKD V now requiring initiation of HD who presents today for tunneled HD catheter placement.  Risks and benefits discussed with the patient including, but not limited to bleeding, infection, vascular injury, pneumothorax which may require chest tube placement, air embolism or even death.  All of the patient's questions were answered, patient is agreeable to proceed.  Consent signed and in chart.  Thank you for this interesting consult.  I greatly enjoyed meeting Keron Neenan and look forward to participating in their care.  A copy of this report was sent to the requesting provider on this date.  Electronically Signed: Joaquim Nam, PA-C 08/30/2020, 1:20 PM   I spent a total of  30 Minutes  in face to face in clinical consultation, greater than 50% of which was counseling/coordinating care for HD catheter placement.

## 2020-08-30 NOTE — Discharge Instructions (Addendum)
Central Line Dialysis Access Placement, Care After This sheet gives you information about how to care for yourself after your procedure. Your health care provider may also give you more specific instructions. If you have problems or questions, contact your health care provider. What can I expect after the procedure? After the procedure, it is common to have:  Mild pain or discomfort.  Mild redness, swelling, or bruising around your incision.  A small amount of blood or clear fluid coming from your incision. Follow these instructions at home: Incision care   Follow instructions from your health care provider about how to take care of your incision. Make sure you: ? Wash your hands with soap and water before you change your bandage (dressing). If soap and water are not available, use hand sanitizer. ? Change your dressing as told by your health care provider. ? Leave stitches (sutures) in place.  Check your incision area every day for signs of infection. Check for: ? More redness, swelling, or pain. ? More fluid or blood. ? Warmth. ? Pus or a bad smell.  If directed, put heat on the catheter site as often as told by your health care provider. Use the heat source that your health care provider recommends, such as a moist heat pack or a heating pad. ? Place a towel between your skin and the heat source. ? Leave the heat on for 20-30 minutes. ? Remove the heat if your skin turns bright red. This is especially important if you are unable to feel pain, heat, or cold. You may have a greater risk of getting burned.  If directed, put ice on the catheter site: ? Put ice in a plastic bag. ? Place a towel between your skin and the bag. ? Leave the ice on for 20 minutes, 2-3 times a day. Medicines  Take over-the-counter and prescription medicines only as told by your health care provider.  If you were prescribed an antibiotic medicine, use it as told by your health care provider. Do not stop  using the antibiotic even if you start to feel better. Activity  Return to your normal activities as told by your health care provider. Ask your health care provider what activities are safe for you.  Do not lift anything that is heavier than 10 lb (4.5 kg) until your health care provider says that this is safe. Driving  Do not drive for 24 hours if you were given a medicine to help you relax (sedative) during your procedure.  Do not drive or use heavy machinery while taking prescription pain medicine. Lifestyle  Limit alcohol intake to no more than 1 drink a day for nonpregnant women and 2 drinks a day for men. One drink equals 12 oz of beer, 5 oz of wine, or 1 oz of hard liquor.  Do not use any products that contain nicotine or tobacco, such as cigarettes and e-cigarettes. If you need help quitting, ask your health care provider. General instructions  Do not take baths or showers, swim, or use a hot tub until your health care provider approves. You may only be allowed to take sponge baths for bathing.  Wear compression stockings as told by your health care provider. These stockings help to prevent blood clots and reduce swelling in your legs.  Follow instructions from your health care provider about eating or drinking restrictions.  Keep all follow-up visits as told by your health care provider. This is important. Contact a health care provider if:  Your  catheter gets pulled out of place.  Your catheter site becomes itchy.  You develop a rash around your catheter site.  You have more redness, swelling, or pain around your incision.  You have more fluid or blood coming from your incision.  Your incision area feels warm to the touch.  You have pus or a bad smell coming from your incision.  You have a fever. Get help right away if:  You become light-headed or dizzy.  You faint.  You have difficulty breathing.  Your catheter gets pulled out completely. This  information is not intended to replace advice given to you by your health care provider. Make sure you discuss any questions you have with your health care provider. Document Revised: 09/05/2017 Document Reviewed: 06/17/2016 Elsevier Patient Education  [place]. Moderate Conscious Sedation, Adult Sedation is the use of medicines to promote relaxation and relieve discomfort and anxiety. Moderate conscious sedation is a type of sedation. Under moderate conscious sedation, you are less alert than normal, but you are still able to respond to instructions, touch, or both. Moderate conscious sedation is used during short medical and dental procedures. It is milder than deep sedation, which is a type of sedation under which you cannot be easily woken up. It is also milder than general anesthesia, which is the use of medicines to make you unconscious. Moderate conscious sedation allows you to return to your regular activities sooner. Tell a health care provider about:  Any allergies you have.  All medicines you are taking, including vitamins, herbs, eye drops, creams, and over-the-counter medicines.  Use of steroids (by mouth or creams).  Any problems you or family members have had with sedatives and anesthetic medicines.  Any blood disorders you have.  Any surgeries you have had.  Any medical conditions you have, such as sleep apnea.  Whether you are pregnant or may be pregnant.  Any use of cigarettes, alcohol, marijuana, or street drugs. What are the risks? Generally, this is a safe procedure. However, problems may occur, including:  Getting too much medicine (oversedation).  Nausea.  Allergic reaction to medicines.  Trouble breathing. If this happens, a breathing tube may be used to help with breathing. It will be removed when you are awake and breathing on your own.  Heart trouble.  Lung trouble. What happens before the procedure? Staying hydrated Follow instructions  from your health care provider about hydration, which may include:  Up to 2 hours before the procedure - you may continue to drink clear liquids, such as water, clear fruit juice, black coffee, and plain tea. Eating and drinking restrictions Follow instructions from your health care provider about eating and drinking, which may include:  8 hours before the procedure - stop eating heavy meals or foods such as meat, fried foods, or fatty foods.  6 hours before the procedure - stop eating light meals or foods, such as toast or cereal.  6 hours before the procedure - stop drinking milk or drinks that contain milk.  2 hours before the procedure - stop drinking clear liquids. Medicine Ask your health care provider about:  Changing or stopping your regular medicines. This is especially important if you are taking diabetes medicines or blood thinners.  Taking medicines such as aspirin and ibuprofen. These medicines can thin your blood. Do not take these medicines before your procedure if your health care provider instructs you not to.  Tests and exams  You will have a physical exam.  You may  have blood tests done to show: ? How well your kidneys and liver are working. ? How well your blood can clot. General instructions  Plan to have someone take you home from the hospital or clinic.  If you will be going home right after the procedure, plan to have someone with you for 24 hours. What happens during the procedure?  An IV tube will be inserted into one of your veins.  Medicine to help you relax (sedative) will be given through the IV tube.  The medical or dental procedure will be performed. What happens after the procedure?  Your blood pressure, heart rate, breathing rate, and blood oxygen level will be monitored often until the medicines you were given have worn off.  Do not drive for 24 hours. This information is not intended to replace advice given to you by your health care  provider. Make sure you discuss any questions you have with your health care provider. Document Revised: 09/05/2017 Document Reviewed: 01/13/2016 Elsevier Patient Education  2020 [employer].

## 2020-09-01 ENCOUNTER — Other Ambulatory Visit (HOSPITAL_COMMUNITY): Payer: Self-pay | Admitting: Nephrology

## 2020-09-01 ENCOUNTER — Encounter (HOSPITAL_COMMUNITY): Payer: Self-pay

## 2020-09-01 ENCOUNTER — Other Ambulatory Visit: Payer: Self-pay

## 2020-09-01 ENCOUNTER — Emergency Department (HOSPITAL_COMMUNITY)
Admission: EM | Admit: 2020-09-01 | Discharge: 2020-09-01 | Disposition: A | Discharge status: Home / Self Care | Payer: 59 | Attending: Emergency Medicine | Admitting: Emergency Medicine

## 2020-09-01 DIAGNOSIS — L97529 Non-pressure chronic ulcer of other part of left foot with unspecified severity: Secondary | ICD-10-CM | POA: Insufficient documentation

## 2020-09-01 DIAGNOSIS — N186 End stage renal disease: Secondary | ICD-10-CM

## 2020-09-01 DIAGNOSIS — T8249XA Other complication of vascular dialysis catheter, initial encounter: Secondary | ICD-10-CM | POA: Diagnosis not present

## 2020-09-01 DIAGNOSIS — Z7901 Long term (current) use of anticoagulants: Secondary | ICD-10-CM | POA: Diagnosis not present

## 2020-09-01 DIAGNOSIS — N179 Acute kidney failure, unspecified: Secondary | ICD-10-CM | POA: Insufficient documentation

## 2020-09-01 DIAGNOSIS — Z79899 Other long term (current) drug therapy: Secondary | ICD-10-CM | POA: Diagnosis not present

## 2020-09-01 DIAGNOSIS — Z91040 Latex allergy status: Secondary | ICD-10-CM | POA: Insufficient documentation

## 2020-09-01 DIAGNOSIS — Z452 Encounter for adjustment and management of vascular access device: Secondary | ICD-10-CM | POA: Diagnosis present

## 2020-09-01 DIAGNOSIS — N184 Chronic kidney disease, stage 4 (severe): Secondary | ICD-10-CM | POA: Insufficient documentation

## 2020-09-01 DIAGNOSIS — Z87891 Personal history of nicotine dependence: Secondary | ICD-10-CM | POA: Insufficient documentation

## 2020-09-01 DIAGNOSIS — E1122 Type 2 diabetes mellitus with diabetic chronic kidney disease: Secondary | ICD-10-CM | POA: Insufficient documentation

## 2020-09-01 DIAGNOSIS — T829XXA Unspecified complication of cardiac and vascular prosthetic device, implant and graft, initial encounter: Secondary | ICD-10-CM

## 2020-09-01 DIAGNOSIS — I129 Hypertensive chronic kidney disease with stage 1 through stage 4 chronic kidney disease, or unspecified chronic kidney disease: Secondary | ICD-10-CM | POA: Diagnosis not present

## 2020-09-01 DIAGNOSIS — T8243XA Leakage of vascular dialysis catheter, initial encounter: Secondary | ICD-10-CM | POA: Diagnosis not present

## 2020-09-01 DIAGNOSIS — E11621 Type 2 diabetes mellitus with foot ulcer: Secondary | ICD-10-CM | POA: Insufficient documentation

## 2020-09-01 LAB — COMPREHENSIVE METABOLIC PANEL
ALT: 13 U/L (ref 0–44)
AST: 16 U/L (ref 15–41)
Albumin: 3.2 g/dL — ABNORMAL LOW (ref 3.5–5.0)
Alkaline Phosphatase: 101 U/L (ref 38–126)
Anion gap: 18 — ABNORMAL HIGH (ref 5–15)
BUN: 127 mg/dL — ABNORMAL HIGH (ref 6–20)
CO2: 16 mmol/L — ABNORMAL LOW (ref 22–32)
Calcium: 7.4 mg/dL — ABNORMAL LOW (ref 8.9–10.3)
Chloride: 111 mmol/L (ref 98–111)
Creatinine, Ser: 9.77 mg/dL — ABNORMAL HIGH (ref 0.61–1.24)
GFR, Estimated: 6 mL/min — ABNORMAL LOW (ref >60)
Glucose, Bld: 89 mg/dL (ref 70–99)
Potassium: 3.7 mmol/L (ref 3.5–5.1)
Sodium: 145 mmol/L (ref 135–145)
Total Bilirubin: 0.5 mg/dL (ref 0.3–1.2)
Total Protein: 6.8 g/dL (ref 6.5–8.1)

## 2020-09-01 LAB — CBC WITH DIFFERENTIAL/PLATELET
Abs Immature Granulocytes: 0.03 10*3/uL (ref 0.00–0.07)
Basophils Absolute: 0.1 10*3/uL (ref 0.0–0.1)
Basophils Relative: 1 %
Eosinophils Absolute: 0.8 10*3/uL — ABNORMAL HIGH (ref 0.0–0.5)
Eosinophils Relative: 9 %
HCT: 25.9 % — ABNORMAL LOW (ref 39.0–52.0)
Hemoglobin: 8.3 g/dL — ABNORMAL LOW (ref 13.0–17.0)
Immature Granulocytes: 0 %
Lymphocytes Relative: 15 %
Lymphs Abs: 1.4 10*3/uL (ref 0.7–4.0)
MCH: 32 pg (ref 26.0–34.0)
MCHC: 32 g/dL (ref 30.0–36.0)
MCV: 100 fL (ref 80.0–100.0)
Monocytes Absolute: 0.9 10*3/uL (ref 0.1–1.0)
Monocytes Relative: 10 %
Neutro Abs: 5.8 10*3/uL (ref 1.7–7.7)
Neutrophils Relative %: 65 %
Platelets: 131 10*3/uL — ABNORMAL LOW (ref 150–400)
RBC: 2.59 MIL/uL — ABNORMAL LOW (ref 4.22–5.81)
RDW: 13.7 % (ref 11.5–15.5)
WBC: 9 10*3/uL (ref 4.0–10.5)
nRBC: 0 % (ref 0.0–0.2)

## 2020-09-01 MED ORDER — DIPHENHYDRAMINE HCL 25 MG PO CAPS
25.0000 mg | ORAL_CAPSULE | Freq: Once | ORAL | Status: AC
  Administered 2020-09-01: 25 mg via ORAL
  Filled 2020-09-01: qty 1

## 2020-09-01 NOTE — ED Triage Notes (Signed)
Sent by dialysis center. Pt had dialysis catheter placed Wednesday and needs it flushed today.

## 2020-09-01 NOTE — Discharge Instructions (Addendum)
As discussed, they are working on getting you an appointment for dialysis. If you have any questions, call their office on Monday. As far as the itching, the nephrologist said it should go away once you start dialysis and the dressing is changed. Do not put any lotion on site or submerge in water. Return to the ER for new or worsening symptoms.

## 2020-09-01 NOTE — ED Notes (Addendum)
Dressing reinforced with paper tape

## 2020-09-01 NOTE — ED Provider Notes (Signed)
[facility] EMERGENCY DEPARTMENT Provider Note   CSN: [id number] Arrival date & time: 09/01/20  0945     History Chief Complaint  Patient presents with  . needs dialysis catheter flushed    [name] is a 54 y.o. male with a past medical history significant for end-stage renal disease, diabetes, and hypertension who presents to the ED due to severe, constant pruritus around catheter that was placed on Wednesday. Patient states he had a tunnelled HD catheter placed on Wednesday to start dialysis and has been having severe pruritis ever since. Denies rash. No treatment prior to arrival. He went to the dialysis center today to ask for guidance on the pruritis and to set up dialysis, but was sent to the ED for further evaluation. He is also concerned that he has not had dialysis and has no dialysis plans. He is being followed by Dr. Candiss Norse with nephrology.  Patient denies chest pain, shortness of breath, lower extremity edema, abdominal distention. Denies fever and chills.  History obtained from patient and past medical records. No interpreter used during encounter.      Past Medical History:  Diagnosis Date  . Allergy   . CKD (chronic kidney disease), stage IV ([place])   . Diabetes mellitus    Type 2 DM, Patient reports DM Type 1 diagnosd age at age 79  . Diabetic foot ulcers ([place])   . Foreign body in left foot    with  infection  . Hypertension   . Meniscus tear   . Multiple rib fractures   . Personal history of diabetic foot ulcer, Left Foot     Patient Active Problem List   Diagnosis Date Noted  . Hypoglycemia 08/03/2020  . Diarrhea 08/02/2020  . Hypertensive emergency 07/27/2020  . Nephrotic range proteinuria 07/27/2020  . Acute stress reaction 07/25/2020  . Chronic kidney disease, stage 5 ([place]) 03/07/2020  . Hematuria 03/07/2020  . Foot callus 07/01/2018  . Hyperlipidemia 05/28/2016  . Hypertriglyceridemia 05/28/2016  . AKI (acute kidney  injury) ([place]) 10/09/2012  . Type 2 diabetes mellitus with stage 4 chronic kidney disease, without long-term current use of insulin ([place]) 06/01/2012  . Hypertension 04/14/2012  . DM (diabetes mellitus), type 2, uncontrolled ([place]) 04/13/2012    Past Surgical History:  Procedure Laterality Date  . EYE SURGERY    . I & D EXTREMITY  04/23/2012   Procedure: IRRIGATION AND DEBRIDEMENT EXTREMITY;  Surgeon: Newt Minion, MD;  Location: [facility];  Service: Orthopedics;  Laterality: Left;  Irrigation and Debridement Left Foot  . I & D EXTREMITY  09/18/2012   Procedure: IRRIGATION AND DEBRIDEMENT EXTREMITY;  Surgeon: Newt Minion, MD;  Location: [facility];  Service: Orthopedics;  Laterality: Left;  . IR PERC TUN PERIT CATH WO PORT S&I Dartha Lodge  08/30/2020  . IR US GUIDE VASC ACCESS RIGHT  08/30/2020  . MENISCUS REPAIR Left 04/2016   Orthopedist, Dr [name] in [place], [place]       Family History  Problem Relation Age of Onset  . Diabetes type II Mother   . Arthritis Mother   . Diabetes Mother   . Hyperlipidemia Mother   . Hypertension Mother   . Diabetes type II Sister   . Diabetes Father     Social History   Tobacco Use  . Smoking status: Former Smoker    Types: Cigarettes    Quit date: 09/03/2012    Years since quitting: 8.0  . Smokeless tobacco: Former Systems developer  Quit date: 09/03/2012  Substance Use Topics  . Alcohol use: Yes    Alcohol/week: 8.0 standard drinks    Types: 6 Cans of beer, 2 Shots of liquor per week    Comment: weekends.  . Drug use: No    Home Medications Prior to Admission medications   Medication Sig Start Date End Date Taking? Authorizing Provider  acetaminophen (TYLENOL) 325 MG tablet Take 650 mg by mouth every 6 (six) hours as needed for mild pain or headache.    Yes [provider]  amLODipine (NORVASC) 10 MG tablet Take 1 tablet (10 mg total) by mouth at bedtime. 08/02/20  Yes Wilber Oliphant, MD  carvedilol (COREG) 12.5 MG tablet Take 1 tablet (12.5 mg  total) by mouth 2 (two) times daily with a meal. 07/29/20 09/01/20 Yes Benay Pike, MD  furosemide (LASIX) 40 MG tablet Take 80 mg by mouth 2 (two) times daily.  08/23/20  Yes [provider]  hydrALAZINE (APRESOLINE) 50 MG tablet Take 50 mg by mouth 3 (three) times daily.   Yes [provider]  LOKELMA 10 g PACK packet Take 10 g by mouth in the morning and at bedtime. 08/17/20  Yes [provider]  sodium bicarbonate 650 MG tablet Take 1,300 mg by mouth 3 (three) times daily.   Yes [provider]  Blood Glucose Monitoring Suppl (BLOOD GLUCOSE MONITOR SYSTEM) w/Device KIT 1 kit by Does not apply route daily. 02/22/20   Wilber Oliphant, MD  sodium polystyrene (KAYEXALATE) 15 GM/60ML suspension Take 60 mLs (15 g total) by mouth daily. Patient not taking: Reported on 08/29/2020 08/05/20   Lyndee Hensen, DO  terbinafine (LAMISIL) 1 % cream Apply 1 application topically 2 (two) times daily. For 1 week to arms Patient not taking: Reported on 08/15/2020 07/25/20   Martyn Malay, MD    Allergies    Latex and Ace inhibitors  Review of Systems   Review of Systems  Constitutional: Negative for chills and fever.  Respiratory: Negative for shortness of breath.   Cardiovascular: Negative for chest pain.  Gastrointestinal: Negative for abdominal pain, diarrhea, nausea and vomiting.  Neurological: Negative for headaches.  All other systems reviewed and are negative.   Physical Exam Updated Vital Signs BP (!) 155/73   Pulse 69   Temp 97.7 F (36.5 C) (Oral)   Resp 20   SpO2 97%   Physical Exam Vitals and nursing note reviewed.  Constitutional:      General: He is not in acute distress.    Appearance: He is not ill-appearing.  HENT:     Head: Normocephalic.  Eyes:     Pupils: Pupils are equal, round, and reactive to light.  Cardiovascular:     Rate and Rhythm: Normal rate and regular rhythm.     Pulses: Normal pulses.     Heart sounds: Normal heart  sounds. No murmur heard.  No friction rub. No gallop.   Pulmonary:     Effort: Pulmonary effort is normal.     Breath sounds: Normal breath sounds.     Comments: Respirations equal and unlabored, patient able to speak in full sentences, lungs clear to auscultation bilaterally Chest:     Comments: Catheter in place on right side of chest. No surrounding erythema. No purulent drainage. No rash. Abdominal:     General: Abdomen is flat. Bowel sounds are normal. There is no distension.     Palpations: Abdomen is soft.     Tenderness: There  is no abdominal tenderness. There is no guarding or rebound.     Comments: Abdomen soft, nondistended, nontender to palpation in all quadrants without guarding or peritoneal signs. No rebound.   Musculoskeletal:     Cervical back: Neck supple.     Comments: Able to move all 4 extremities without difficulty.   Skin:    General: Skin is warm and dry.  Neurological:     General: No focal deficit present.     Mental Status: He is alert.  Psychiatric:        Mood and Affect: Mood normal.        Behavior: Behavior normal.     ED Results / Procedures / Treatments   Labs (all labs ordered are listed, but only abnormal results are displayed) Labs Reviewed  CBC WITH DIFFERENTIAL/PLATELET - Abnormal; Notable for the following components:      Result Value   RBC 2.59 (*)    Hemoglobin 8.3 (*)    HCT 25.9 (*)    Platelets 131 (*)    Eosinophils Absolute 0.8 (*)    All other components within normal limits  COMPREHENSIVE METABOLIC PANEL - Abnormal; Notable for the following components:   CO2 16 (*)    BUN 127 (*)    Creatinine, Ser 9.77 (*)    Calcium 7.4 (*)    Albumin 3.2 (*)    GFR, Estimated 6 (*)    Anion gap 18 (*)    All other components within normal limits    EKG None  Radiology IR US Guide Vasc Access Right  Result Date: 09/01/2020 Narrative & Impression CLINICAL DATA:  Renal failure, needs access for hemodialysis  EXAM: TUNNELED  HEMODIALYSIS CATHETER PLACEMENT WITH ULTRASOUND AND FLUOROSCOPIC GUIDANCE  TECHNIQUE: The procedure, risks, benefits, and alternatives were explained to the patient. Questions regarding the procedure were encouraged and answered. The patient understands and consents to the procedure.  As antibiotic prophylaxis, cefazolin 2 g was ordered pre-procedure and administered intravenously within one hour of incision.Patency of the right IJ vein was confirmed with ultrasound with image documentation. An appropriate skin site was determined. Region was prepped using maximum barrier technique including cap and mask, sterile gown, sterile gloves, large sterile sheet, and Chlorhexidine as cutaneous antisepsis. The region was infiltrated locally with 1% lidocaine.  Intravenous Fentanyl 25mg and Versed 114mwere administered as conscious sedation during continuous monitoring of the patient's level of consciousness and physiological / cardiorespiratory status by the radiology RN, with a total moderate sedation time of 10 minutes.  Under real-time ultrasound guidance, the right IJ vein was accessed with a 21 gauge micropuncture needle; the needle tip within the vein was confirmed with ultrasound image documentation. Needle exchanged over the 018 guidewire for transitional dilator, which allowed advancement of a Benson wire into the IVC. Over this, an MPA catheter was advanced. A Palindrome 19 hemodialysis catheter was tunneled from the right anterior chest wall approach to the right IJ dermatotomy site. The MPA catheter was exchanged over an Amplatz wire for serial vascular dilators which allow placement of a peel-away sheath, through which the catheter was advanced under intermittent fluoroscopy, positioned with its tips in the proximal and midright atrium. Spot chest radiograph confirms good catheter position. No pneumothorax. Catheter was flushed and primed per protocol. Catheter secured externally with O Prolene sutures. The  right IJ dermatotomy site was closed with Dermabond.  COMPLICATIONS: COMPLICATIONS None immediate  FLUOROSCOPY TIME:  48 seconds; 7 mGy  COMPARISON:  None  IMPRESSION:  1. Technically successful placement of tunneled right IJ hemodialysis catheter with ultrasound and fluoroscopic guidance. Ready for routine use.  ACCESS: Remains approachable for percutaneous intervention as needed.   Electronically Signed   By: Lucrezia Europe M.D.   On: 08/30/2020 16:11    IR TUNNELED CENTRAL VENOUS CATHETER PLACEMENT  Result Date: 08/30/2020 CLINICAL DATA:  Renal failure, needs access for hemodialysis EXAM: TUNNELED HEMODIALYSIS CATHETER PLACEMENT WITH ULTRASOUND AND FLUOROSCOPIC GUIDANCE TECHNIQUE: The procedure, risks, benefits, and alternatives were explained to the patient. Questions regarding the procedure were encouraged and answered. The patient understands and consents to the procedure. As antibiotic prophylaxis, cefazolin 2 g was ordered pre-procedure and administered intravenously within one hour of incision.Patency of the right IJ vein was confirmed with ultrasound with image documentation. An appropriate skin site was determined. Region was prepped using maximum barrier technique including cap and mask, sterile gown, sterile gloves, large sterile sheet, and Chlorhexidine as cutaneous antisepsis. The region was infiltrated locally with 1% lidocaine. Intravenous Fentanyl 55mg and Versed 141mwere administered as conscious sedation during continuous monitoring of the patient's level of consciousness and physiological / cardiorespiratory status by the radiology RN, with a total moderate sedation time of 10 minutes. Under real-time ultrasound guidance, the right IJ vein was accessed with a 21 gauge micropuncture needle; the needle tip within the vein was confirmed with ultrasound image documentation. Needle exchanged over the 018 guidewire for transitional dilator, which allowed advancement of a Benson wire into the  IVC. Over this, an MPA catheter was advanced. A Palindrome 19 hemodialysis catheter was tunneled from the right anterior chest wall approach to the right IJ dermatotomy site. The MPA catheter was exchanged over an Amplatz wire for serial vascular dilators which allow placement of a peel-away sheath, through which the catheter was advanced under intermittent fluoroscopy, positioned with its tips in the proximal and midright atrium. Spot chest radiograph confirms good catheter position. No pneumothorax. Catheter was flushed and primed per protocol. Catheter secured externally with O Prolene sutures. The right IJ dermatotomy site was closed with Dermabond. COMPLICATIONS: COMPLICATIONS None immediate FLUOROSCOPY TIME:  48 seconds; 7 mGy COMPARISON:  None IMPRESSION: 1. Technically successful placement of tunneled right IJ hemodialysis catheter with ultrasound and fluoroscopic guidance. Ready for routine use. ACCESS: Remains approachable for percutaneous intervention as needed. Electronically Signed   By: D Lucrezia Europe.D.   On: 08/30/2020 16:11    Procedures Procedures (including critical care time)  Medications Ordered in ED Medications  diphenhydrAMINE (BENADRYL) capsule 25 mg (25 mg Oral Given 09/01/20 1056)    ED Course  I have reviewed the triage vital signs and the nursing notes.  Pertinent labs & imaging results that were available during my care of the patient were reviewed by me and considered in my medical decision making (see chart for details).  Clinical Course as of Sep 01 1209  Fri Sep 01, 2020  1145 Discussed case with Dr. UpHollie Salkith nephrology who notes that patient is being set up for dialysis and will receive a phone call when his first session is scheduled.    [CA]  1207 Potassium: 3.7 [CA]  1207 Creatinine(!): 9.77 [CA]  1207 BUN(!): 127 [CA]    Clinical Course User Index [CA] AbSuzy BouchardPA-C   MDM Rules/Calculators/A&P                         [age]ear old male  presents to the ED due to pruritus around catheter site.  Patient had a tunneled HD catheter placed on Wednesday to start dialysis and has had pruritis ever since. Patient also notes he has not scheduled dialysis sessions and is inquiring about how to start. Patient has no other physical complaints. Denies chest pain, shortness of breath, abdominal pain, abdominal distention, and lower extremity edema. Upon arrival, stable vitals. Patient in no acute distress and non-toxic appearing. Physical exam reassuring. No signs of fluid overload on exam. Catheter in place with no signs of infection or rash. Will given benadryl for symptomatic relief. Routine labs to check for electrolyte abnormalities.   Discussed case with nephrology. See note above.  ECU reassuring with no leukocytosis.  Anemia with hemoglobin at 8.3 which appears better than patient's baseline likely due to chronic kidney disease.  CMP reassuring with normal potassium.  Elevated creatinine and BUN likely due to end-stage renal failure.  Will discharge patient with Benadryl as needed for pruritus.  Instructed patient not to place any lotion or submerge site in water as discussed with Dr. Hollie Salk.  Advised patient to call the nephrology office on Monday if he has any further questions about scheduling dialysis.  No indication for emergent dialysis at this time.  Patient agreeable to plan. Strict ED precautions discussed with patient. Patient states understanding and agrees to plan. Patient discharged home in no acute distress and stable vitals  Final Clinical Impression(s) / ED Diagnoses Final diagnoses:  Complication associated with dialysis catheter    Rx / DC Orders ED Discharge Orders    None       Karie Kirks 09/01/20 1211    Pattricia Boss, MD 09/04/20 1325

## 2020-09-04 ENCOUNTER — Telehealth: Payer: Self-pay | Admitting: Family Medicine

## 2020-09-04 ENCOUNTER — Other Ambulatory Visit: Payer: Self-pay

## 2020-09-04 ENCOUNTER — Encounter: Payer: Self-pay | Admitting: Family Medicine

## 2020-09-04 ENCOUNTER — Ambulatory Visit (INDEPENDENT_AMBULATORY_CARE_PROVIDER_SITE_OTHER): Payer: 59 | Admitting: Family Medicine

## 2020-09-04 VITALS — BP 124/62 | HR 62 | Wt 207.0 lb

## 2020-09-04 DIAGNOSIS — R531 Weakness: Secondary | ICD-10-CM

## 2020-09-04 DIAGNOSIS — H1131 Conjunctival hemorrhage, right eye: Secondary | ICD-10-CM | POA: Diagnosis not present

## 2020-09-04 DIAGNOSIS — N185 Chronic kidney disease, stage 5: Secondary | ICD-10-CM | POA: Diagnosis not present

## 2020-09-04 NOTE — Assessment & Plan Note (Signed)
Discussed with patient and his partner at length the difference between emergent and urgent dialysis.  He does not currently meet any indication for emergent HD given his labs on 11/26, current mental status, and lack of significant swelling.  He is breathing comfortably at this time.  Had Dr. Andria Frames come in and speak with patient and his wife as well.  Does not appear to have any signs of infection with a brief examination of the site.  Discussed that this will just be a waiting game until nephrology can get a dialysis center in place for him.  Advised him if he has any signs or symptoms of needing emergent HD, to call nephrologist or go to the ED immediately.  He was advised to follow-up with nephrology to ensure that his tunnel catheter is functioning appropriately as well.  He can follow-up with his PCP in 1 to 2 weeks to ensure that this continues to get set up.

## 2020-09-04 NOTE — Assessment & Plan Note (Signed)
Has a slight decrease in visual acuity of his right eye compared to his left eye.  He describes symptoms that seem to be more glare as he is bothered by bright light.  Discussed with Dr. Andria Frames that given this is been going on for at least a week, there is no emergent need to see ophthalmology, however he would need to be seen in the next week or so.  Advised patient to call his eye doctor and try to schedule a sooner appointment.

## 2020-09-04 NOTE — Patient Instructions (Signed)
Thank you for coming to see me today. It was a pleasure. Today we talked about:   Call your eye doctor to schedule an appointment in the next few days.  Keep following up with your kidney doctor and call to see about making an appointment to make sure your catheter is okay.  Please follow-up with your PCP in 1-2 weeks.  If you have any questions or concerns, please do not hesitate to call the office at [phone].  Best,   Arizona Constable, DO

## 2020-09-04 NOTE — Telephone Encounter (Signed)
Patient requesting Dr Maudie Mercury give him a call concerning his kidneys. He states hes 'getting the run around with his kidneys.' Please call patient when you get a chance.

## 2020-09-04 NOTE — Progress Notes (Signed)
SUBJECTIVE:   CHIEF COMPLAINT / HPI:   ESRD, trying to set up HD Patient was seen in ED on 11/26 At that point was reporting constant pruritus around catheter that was placed on 11/24 for HD He was advised to follow-up to set up dialysis Nephrology was consulted in the ED and recommended calling on Monday, 11/29 and he had questions about setting up dialysis Dr. Hollie Salk advised him not to place any lotion or submerge site in water Per note in ED provider note, Dr. Hollie Salk states that patient will be contacted when he is scheduled for first HD session Patient has partner are very frustrated by not knowing when he will be getting dialysis and if he needs dialysis emergently Labs were performed on 11/26, K3.7 at that time, GFR 6 He continues to have itching over the site No confusion or difficulty breathing  Right eye redness Reports that last week, he is noticed that his eye looked like there was blood all over the white part States that he told his nephrologist about it States that since then he has been having increased light sensitivity and has to wear 3 pairs of sunglasses His partner has had to drive him everywhere  PERTINENT  PMH / PSH: ESRD waiting for HD, HTN, T2DM, HLD  OBJECTIVE:   BP 124/62   Pulse 62   Wt 207 lb (93.9 kg)   SpO2 97%   BMI 28.87 kg/m     Hearing Screening   125Hz  250Hz  500Hz  1000Hz  2000Hz  3000Hz  4000Hz  6000Hz  8000Hz   Right ear:           Left ear:             Visual Acuity Screening   Right eye Left eye Both eyes  Without correction: 20/200 20/200 20/100  With correction: 20/40 20/30 20/20      Physical Exam:  General: 54 y.o. male in NAD HEENT: Right subconjunctival hemorrhage, no hyphema noted Lungs: Breathing comfortably on room air Skin: warm and dry, top bandage of patient's tunneled catheter removed with Dr. Andria Frames without showing erythema or purulent drainage surrounding incision, replaced immediately Extremities: Trace  edema   ASSESSMENT/PLAN:   Chronic kidney disease, stage 5 ([place]) Discussed with patient and his partner at length the difference between emergent and urgent dialysis.  He does not currently meet any indication for emergent HD given his labs on 11/26, current mental status, and lack of significant swelling.  He is breathing comfortably at this time.  Had Dr. Andria Frames come in and speak with patient and his wife as well.  Does not appear to have any signs of infection with a brief examination of the site.  Discussed that this will just be a waiting game until nephrology can get a dialysis center in place for him.  Advised him if he has any signs or symptoms of needing emergent HD, to call nephrologist or go to the ED immediately.  He was advised to follow-up with nephrology to ensure that his tunnel catheter is functioning appropriately as well.  He can follow-up with his PCP in 1 to 2 weeks to ensure that this continues to get set up.  Subconjunctival hemorrhage of right eye Has a slight decrease in visual acuity of his right eye compared to his left eye.  He describes symptoms that seem to be more glare as he is bothered by bright light.  Discussed with Dr. Andria Frames that given this is been going on for at least a week, there  is no emergent need to see ophthalmology, however he would need to be seen in the next week or so.  Advised patient to call his eye doctor and try to schedule a sooner appointment.     Cleophas Dunker, [place]

## 2020-09-06 MED ORDER — CARVEDILOL 12.5 MG PO TABS: 12.5000 mg | ORAL_TABLET | Freq: Two times a day (BID) | ORAL | 0 refills | Status: DC

## 2020-09-06 MED ORDER — FUROSEMIDE 80 MG PO TABS: 80.0000 mg | ORAL_TABLET | Freq: Two times a day (BID) | ORAL | 0 refills | Status: DC

## 2020-09-06 NOTE — Telephone Encounter (Signed)
Patient is doing well at this time. He reports he was frustrated at that time. Reports that everything is in order now. Had a few medical questions, which I answered.   Requesting refills on medications   Carvedilol - refilled   Lasix - now taking 80 mg BID per [facility] - refilled   Requesting referral to PT for generalized weakness. Reports his wife has been taking him to [facility] so that he can walk around. He reports when he gets home, he is BLEE that improves with lasix and leg elevation. Would like to exercise, but feeling generally weak - referral placed   Requesting referral to nutritionist - recommended asking [facility] about this as they have RD specific to renal patients which he would benefit from   Wilber Oliphant, M.D.  8:25 PM 09/06/2020

## 2020-09-07 ENCOUNTER — Telehealth: Payer: Self-pay | Admitting: Family Medicine

## 2020-09-07 NOTE — Telephone Encounter (Signed)
Patient is dropping off handicap tag form to be completed. Last DOS: 09/04/2020 with [employer]. Patient wants to pick it up when it is complete [phone]. Placing it in [employer]. Thanks

## 2020-09-08 NOTE — Telephone Encounter (Signed)
Clinical info completed on Handicap Plaque form.  Placed form in Dr. Julianne Rice box for completion.  Ottis Stain, CMA

## 2020-09-08 NOTE — Telephone Encounter (Signed)
Addressed by referral coordinator. Christen Bame, CMA

## 2020-09-10 NOTE — Telephone Encounter (Signed)
Filled out and placed in RN box.   Wilber Oliphant, M.D.  11:10 PM 09/10/2020

## 2020-09-11 NOTE — Telephone Encounter (Signed)
Patient called and informed that forms are ready for pick up. Copy made and placed in batch scanning. Original placed at front desk for pick up.   Tawny Raspberry C Danaysia Rader, RN  

## 2020-09-22 ENCOUNTER — Other Ambulatory Visit: Payer: Self-pay | Admitting: Family Medicine

## 2020-10-10 ENCOUNTER — Ambulatory Visit: Payer: 59 | Admitting: Family Medicine

## 2020-10-17 ENCOUNTER — Telehealth: Payer: Self-pay | Admitting: Family Medicine

## 2020-10-17 NOTE — Telephone Encounter (Signed)
Patient calls requesting a letter from Dr Maudie Mercury stating he cannot physically work anymore due to his diagnosis of kidney failure and having to be on dialysis. Please call patient with any questions.

## 2020-10-18 ENCOUNTER — Encounter: Payer: Self-pay | Admitting: Family Medicine

## 2020-10-18 ENCOUNTER — Other Ambulatory Visit: Payer: Self-pay | Admitting: *Deleted

## 2020-10-18 DIAGNOSIS — N185 Chronic kidney disease, stage 5: Secondary | ICD-10-CM

## 2020-10-19 NOTE — Telephone Encounter (Signed)
Called patient and left VM as no answer. I would like to get more information about his work and current status to help with a note. Additionally, I can write him a note; however, if he is looking for disability for ESRD and dialysis, there would be more formal paperwork (e.g. disability, FMLA, etc) that I would recommend giving to his nephrologist. Asked patient to call back the office.   Wilber Oliphant, M.D.  3:19 PM 10/19/2020

## 2020-10-19 NOTE — Telephone Encounter (Signed)
Patient returns call to nurse line. Patient reports he just needs a "generic" note from PCP stating he can not work until further notice due to dialysis. Patient reports he has a disability apt in March and will work his Nephrologist on that. If you need to call him back he said he is available now he was at dialysis when you originally called.

## 2020-10-19 NOTE — Telephone Encounter (Signed)
Called patient back. Will place signed note at front desk for patient to pick up.   Wilber Oliphant, M.D.  4:53 PM 10/19/2020

## 2020-10-20 ENCOUNTER — Telehealth: Payer: Self-pay

## 2020-10-20 ENCOUNTER — Other Ambulatory Visit: Payer: Self-pay

## 2020-10-20 ENCOUNTER — Ambulatory Visit (INDEPENDENT_AMBULATORY_CARE_PROVIDER_SITE_OTHER): Payer: Self-pay | Admitting: Family Medicine

## 2020-10-20 ENCOUNTER — Other Ambulatory Visit: Payer: Self-pay | Admitting: Family Medicine

## 2020-10-20 VITALS — BP 138/80 | HR 80 | Ht 71.0 in | Wt 186.0 lb

## 2020-10-20 DIAGNOSIS — R61 Generalized hyperhidrosis: Secondary | ICD-10-CM | POA: Insufficient documentation

## 2020-10-20 DIAGNOSIS — E1122 Type 2 diabetes mellitus with diabetic chronic kidney disease: Secondary | ICD-10-CM

## 2020-10-20 DIAGNOSIS — N184 Chronic kidney disease, stage 4 (severe): Secondary | ICD-10-CM

## 2020-10-20 LAB — HEMOGLOBIN A1C: A1c: 5.5

## 2020-10-20 LAB — GLUCOSE, POCT (MANUAL RESULT ENTRY): POC Glucose: 144 mg/dl — AB (ref 70–99)

## 2020-10-20 MED ORDER — CARVEDILOL 12.5 MG PO TABS: 12.5000 mg | ORAL_TABLET | Freq: Two times a day (BID) | ORAL | 5 refills | Status: DC

## 2020-10-20 MED ORDER — AMOXICILLIN 500 MG PO CAPS: 500.0000 mg | ORAL_CAPSULE | Freq: Three times a day (TID) | ORAL | 0 refills | Status: DC

## 2020-10-20 MED ORDER — AMOXICILLIN 500 MG PO CAPS
500.0000 mg | ORAL_CAPSULE | Freq: Three times a day (TID) | ORAL | 0 refills | Status: DC
Start: 2020-10-20 — End: 2020-11-13

## 2020-10-20 NOTE — Patient Instructions (Signed)
It was great seeing you today.  The dental abscess may be the cause of your fevers but I would typically expect that she would still be febrile and you are not.  I do want to check some lab work today and I have started you on antibiotics.  You will take 1 pill (500 mg) after dialysis on dialysis days.  I have given you 4 doses.  Please let your dialysis center know about your tooth abscess.  Below is Emergency planning/management officer.  If you have any worsening symptoms, questions, concerns please will free to call the clinic.  I hope you have a wonderful afternoon!

## 2020-10-20 NOTE — Assessment & Plan Note (Signed)
Patient reports history of night sweats in the past when he was drinking but none over the past year.  Had an episode of night sweats last night which she says "soaked his bed".  Patient is a new dialysis patient and has a catheter for access.  Patient also was found to have a dental abscess.  Unclear of etiology of the night sweats but concerned for possible infection. - CBC collected - Patient prescribed amoxicillin 500 mg on dialysis days after dialysis sessions for 4 doses - Other possible causes include hypo and hyperglycemia, blood glucose today of 144, patient did eat breakfast this morning - Strict ED precautions given - We will follow-up as needed - Patient instructed to inform dialysis center of these issues and his tooth abscess - Patient provided resources for dentists

## 2020-10-20 NOTE — Progress Notes (Signed)
    SUBJECTIVE:   CHIEF COMPLAINT / HPI:   Night sweats  Patient reports that he was asleep last night and woke up around 11 PM completely drenched in sweat.  This is the first recent occurrence of this but patient was concerned because he recently started dialysis.  Patient reports he did have issues with this in the past 1 year or 2 ago but he reports he was drinking and using other things at that time.  Denies any fever.  Reports that he checked his blood sugar in the morning when he woke up and it was 160.  He also checked his blood pressure before taking his blood pressure medications and it was 170/90.  Denies any other sick symptoms.  He brought his lab work with him from dialysis which was collected yesterday with hemoglobin of 11, calcium within normal limits, the rest of his lab work was essentially normal except no WBC was collected.  Tooth abscess When going back to see the patient to discharge him he reports that he was thinking about it and he does have a tooth that has been draining pus for several days now.  Reports that he is trying to see a dentist but is having difficulty finding one.    OBJECTIVE:   BP 138/80   Pulse 80   Ht 5\' 11"  (1.803 m)   Wt 186 lb (84.4 kg)   SpO2 98%   BMI 25.94 kg/m   General: Well-appearing 55 year old male, no acute distress HEENT: Patient has extremely poor dentition, abscess on superior gumline draining pus just above front left tooth.  With palpation able to drain more pus from this.  No warmth on his face or tenderness to palpation above the abscess. Cardiac: Regular rate and rhythm, no murmurs appreciated Respiratory: Normal work of breathing, lungs clear to auscultation bilaterally Abdomen: Soft, nontender, positive bowel sounds Derm: Patient has cath for dialysis, not warm around access site and no erythema noted  ASSESSMENT/PLAN:   Night sweats Patient reports history of night sweats in the past when he was drinking but none over  the past year.  Had an episode of night sweats last night which she says "soaked his bed".  Patient is a new dialysis patient and has a catheter for access.  Patient also was found to have a dental abscess.  Unclear of etiology of the night sweats but concerned for possible infection. - CBC collected - Patient prescribed amoxicillin 500 mg on dialysis days after dialysis sessions for 4 doses - Other possible causes include hypo and hyperglycemia, blood glucose today of 144, patient did eat breakfast this morning - Strict ED precautions given - We will follow-up as needed - Patient instructed to inform dialysis center of these issues and his tooth abscess - Patient provided resources for dentists     Gifford Shave, MD [place]

## 2020-10-20 NOTE — Telephone Encounter (Signed)
Patient calls nurse line regarding concerns for elevated blood sugar readings. Patient reports blood sugar readings in the low 200s. Patient previously prescribed metformin, however was discontinued due to hypoglycemic events. Patient wants to know if he should restart metformin or how to proceed with elevated readings. Patient is also reporting elevated BP readings up to 170's/90's.   Advised patient that we should schedule with provider for further management. Scheduled same day appointment in [facility] this morning for further evaluation.   Talbot Grumbling, RN

## 2020-10-21 LAB — CBC
Hematocrit: 39.6 % (ref 37.5–51.0)
Hemoglobin: 12.9 g/dL — ABNORMAL LOW (ref 13.0–17.7)
MCH: 30.9 pg (ref 26.6–33.0)
MCHC: 32.6 g/dL (ref 31.5–35.7)
MCV: 95 fL (ref 79–97)
Platelets: 267 10*3/uL (ref 150–450)
RBC: 4.18 x10E6/uL (ref 4.14–5.80)
RDW: 13.2 % (ref 11.6–15.4)
WBC: 8.3 10*3/uL (ref 3.4–10.8)

## 2020-10-25 ENCOUNTER — Other Ambulatory Visit: Payer: Self-pay

## 2020-10-25 ENCOUNTER — Ambulatory Visit: Payer: 59 | Attending: Family Medicine

## 2020-10-25 ENCOUNTER — Ambulatory Visit: Payer: 59 | Admitting: Podiatry

## 2020-10-25 DIAGNOSIS — R262 Difficulty in walking, not elsewhere classified: Secondary | ICD-10-CM | POA: Insufficient documentation

## 2020-10-25 DIAGNOSIS — M545 Low back pain, unspecified: Secondary | ICD-10-CM | POA: Diagnosis present

## 2020-10-25 DIAGNOSIS — G8929 Other chronic pain: Secondary | ICD-10-CM | POA: Insufficient documentation

## 2020-10-25 DIAGNOSIS — M6281 Muscle weakness (generalized): Secondary | ICD-10-CM | POA: Insufficient documentation

## 2020-10-25 NOTE — Therapy (Signed)
[place], [place], [address] Phone: [phone]   Fax:  [phone]  Physical Therapy Evaluation  Patient Details  Name: [name] MRN: [id number] Date of Birth: 08-19-1966 Referring Provider (PT): Martyn Malay, MD   Encounter Date: 10/25/2020   PT End of Session - 10/25/20 1040    Visit Number 1    Number of Visits 9    Date for PT Re-Evaluation 12/23/20    Authorization Type [employer] - FOTO visit 6 and visit 10    PT Start Time 1045    PT Stop Time 1131    PT Time Calculation (min) 46 min    Activity Tolerance Patient tolerated treatment well    Behavior During Therapy [facility] for tasks assessed/performed           Past Medical History:  Diagnosis Date  . Allergy   . CKD (chronic kidney disease), stage IV ([place])   . Diabetes mellitus    Type 2 DM, Patient reports DM Type 1 diagnosd age at age 42  . Diabetic foot ulcers ([place])   . Foreign body in left foot    with  infection  . Hypertension   . Meniscus tear   . Multiple rib fractures   . Personal history of diabetic foot ulcer, Left Foot     Past Surgical History:  Procedure Laterality Date  . EYE SURGERY    . I & D EXTREMITY  04/23/2012   Procedure: IRRIGATION AND DEBRIDEMENT EXTREMITY;  Surgeon: Newt Minion, MD;  Location: [place];  Service: Orthopedics;  Laterality: Left;  Irrigation and Debridement Left Foot  . I & D EXTREMITY  09/18/2012   Procedure: IRRIGATION AND DEBRIDEMENT EXTREMITY;  Surgeon: Newt Minion, MD;  Location: [place];  Service: Orthopedics;  Laterality: Left;  . IR PERC TUN PERIT CATH WO PORT S&I Dartha [facility]  08/30/2020  . IR US GUIDE VASC ACCESS RIGHT  08/30/2020  . MENISCUS REPAIR Left 04/2016   Orthopedist, Dr [name] in [place], [place]    There were no vitals filed for this visit.    Subjective Assessment - 10/25/20 1042    Subjective "I was diagnosed back in June 2021 for kidney failure. I was really  weak and slept a lot of hours. I noticed my hips and legs have been really weak and have been hurting. 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 I notice my legs start to shake when I go up and down steps. My hips start hurting when I walk for a while." Pt explains having LBP with standing and prolonged walking that is accompanied with weakness in RLE during which he feels like he is dragging his R leg. He reports occasional numbness/tingling along distal medial lower leg but denies symptoms throughout entire RLE.    Pertinent History See PMH above    Limitations Standing;Walking;Lifting    How long can you sit comfortably? No issues    How long can you stand comfortably? No issues    How long can you walk comfortably? "I walk around [place] - the entire store - once before I have to sit down. I push a cart usually, which helps me."    Patient Stated Goals "I just want to be more active and get my strength back."    Currently in Pain? No/denies    Pain Radiating Towards LBP and RLE starts to drag with prolonged walking/standing  Pain Onset More than a month ago    Pain Frequency Intermittent    Aggravating Factors  Prolonged walking/activity causes cramping in hamstrings and gastroc-soleus    Pain Relieving Factors Stretches    Effect of Pain on Daily Activities Difficulty with prolonged activity              [facility] PT Assessment - 10/25/20 0001      Assessment   Medical Diagnosis Generalized weakness (R53.1)    Referring Provider (PT) Martyn Malay, MD    Onset Date/Surgical Date --   A little before June 2021 - noticed weakness and did not know what was going on then found out about kidney failure during a check up   Hand Dominance Right    Next MD Visit Nothing scheduled yet    Prior Therapy Back in 2018 for injury after fall      Precautions   Precautions None      Restrictions   Weight Bearing Restrictions No    Other Position/Activity  Restrictions Lifting restriction 10# with RUE due to dialysis catheter      Balance Screen   Has the patient fallen in the past 6 months No    Has the patient had a decrease in activity level because of a fear of falling?  Yes    Is the patient reluctant to leave their home because of a fear of falling?  No   has been trying to increase activity and get out more lately     [place] other   wife and 2 dogs   Available Help at Discharge Family    Type of [place] to enter    Entrance Stairs-Number of Steps 1    Entrance Stairs-Rails None   leans on banisters for [place] One level    [place] None      Prior Function   Level of Independence Independent    Vocation Retired    Restaurant manager, fast food and crafts, music, dancing      Cognition   Overall Cognitive Status Within Functional Limits for tasks assessed      Observation/Other Assessments   Focus on Therapeutic Outcomes (FOTO)  42% functional status; predicted 55% function      ROM / Strength   AROM / PROM / Strength AROM;Strength      AROM   AROM Assessment Site Lumbar    Lumbar Flexion mid shin with fingertips    Lumbar Extension minimal limitation with pain    Lumbar - Right Side Bend mid thigh    Lumbar - Left Side Bend mid thigh    Lumbar - Right Rotation minimal limitation with pt reporting "feels tight"    Lumbar - Left Rotation minimal limitation with pt reporting "feels tight"      Strength   Overall Strength Comments BUE 4+/5 grossly    Strength Assessment Site Shoulder;Elbow;Hip;Knee;Ankle    Right/Left Shoulder Right;Left    Right/Left Hip Right;Left    Right Hip Flexion 3+/5    Right Hip ABduction 4-/5    Right Hip ADduction 4-/5    Left Hip Flexion 4-/5    Left Hip ABduction 4/5    Left Hip ADduction 4/5    Right/Left Knee Right;Left    Right Knee Flexion 4/5    Right Knee  Extension 4/5    Left Knee Flexion  4/5    Left Knee Extension 4/5    Right/Left Ankle Right;Left    Right Ankle Dorsiflexion 4-/5    Right Ankle Plantar Flexion 4+/5    Left Ankle Dorsiflexion 4+/5    Left Ankle Plantar Flexion 4+/5      Flexibility   Soft Tissue Assessment /Muscle Length yes    Hamstrings limitation at 120 degrees bilaterally      Palpation   Palpation comment TTP along lumbar paraspinals and at L PSIS      Special Tests   Other special tests No innominate rotation noted upon long sit test during evaluation      Standardized Balance Assessment   Standardized Balance Assessment Timed Up and Go Test      Timed Up and Go Test   Normal TUG (seconds) 13.87                      Objective measurements completed on examination: See above findings.       [place] Adult PT Treatment/Exercise - 10/25/20 0001      Transfers   Five time sit to stand comments  32.26 seconds      Exercises   Exercises Knee/Hip      Knee/Hip Exercises: Stretches   Passive Hamstring Stretch Both;2 reps;30 seconds    Passive Hamstring Stretch Limitations 1x with green strap and 1x seated at EOM to demonstrate different ways to perform HS stretch for HEP      Knee/Hip Exercises: Supine   Bridges Strengthening;Both;10 reps    Bridges Limitations Cues for core and glute activation                  PT Education - 10/25/20 1357    Education Details Reviewed HEP, anatomy of condition, FOTO and potential progress, and POC.    Person(s) Educated Patient    Methods Explanation;Demonstration;Tactile cues;Verbal cues;Handout    Comprehension Verbalized understanding;Returned demonstration;Verbal cues required;Tactile cues required            PT Short Term Goals - 10/25/20 1359      PT SHORT TERM GOAL #1   Title Pt will be independent with initial HEP.    Baseline Pt provided initial HEP at evaluation 10/25/2020.    Time 4    Period Weeks    Status New    Target  Date 11/22/20      PT SHORT TERM GOAL #2   Title Pt's TUG time will improve from 13.87 seconds to </= 12 seconds to progress towards improved safety and decreased fall risk with community ambulation.    Baseline 13.87 seconds    Time 4    Period Weeks    Status New    Target Date 11/22/20      PT SHORT TERM GOAL #3   Title Pt will be able to perform tandem standing for >/= 10 seconds on level ground without UE support to demonstrate improved balance.    Baseline Pt requires stepping strategy to maintain balance after 1-2 seconds in tandem stance.    Time 4    Period Weeks    Status New    Target Date 11/22/20      PT SHORT TERM GOAL #4   Title Pt's 5xSTS time will improve from 32.26 seconds to </= 28 seconds to demonstrate improved functional strength.    Baseline 32.26 seconds    Time 4    Period Weeks    Status New  Target Date 11/22/20      PT SHORT TERM GOAL #5   Title Pt's R hip FL and ankle DF MMT will improve to at least 4/5 and 4+/5 respectively.    Baseline MMT: R hip FL 3+/5 and R ankle DF 4-/5    Time 4    Period Weeks    Status New    Target Date 11/22/20             PT Long Term Goals - 10/25/20 1407      PT LONG TERM GOAL #1   Title Pt will be independent with advanced HEP.    Baseline Pt provided initial HEP at evaluation 10/25/2020.    Time 8    Period Weeks    Status New    Target Date 12/20/20      PT LONG TERM GOAL #2   Title Pt's TUG time will improve from 13.87 seconds to </= 10 seconds to demonstrate improved safety and decreased fall risk with community ambulation.    Baseline 13.87 seconds    Time 8    Period Weeks    Status New    Target Date 12/20/20      PT LONG TERM GOAL #3   Title Pt will be able to perform SLS with each LE for >/= 10 seconds on level ground without UE support to demonstrate improved balance.    Baseline Pt requires stepping strategy to maintain balance after 1-2 seconds in tandem stance and unable to perform  SLS without support    Time 8    Period Weeks    Status New    Target Date 12/20/20      PT LONG TERM GOAL #4   Title Pt's 5xSTS time will improve from 32.26 seconds to </= 25 seconds to demonstrate improved functional strength.    Baseline 32.26 seconds    Time 8    Period Weeks    Status New    Target Date 12/20/20      PT LONG TERM GOAL #5   Title Pt's FOTO score will increase from 42% to 55% functional status to demonstrate improved perceived ability.    Baseline 42% functional status; predicted 55% function    Time 8    Period Weeks    Status New    Target Date 12/20/20                  Plan - 10/25/20 1041    Clinical Impression Statement Patient is a 55 year old male who presents to OPPT with complaints of generalized weakness since that began a little before June 2021 when he was diagnosed with kidney failure. He recently began dialysis on Tuesdays and Thursdays in December 2021 and reports that he has been building his tolerance to activity as able but notices increased weakness and occasional pain in low back/hips and weakness in RLE with prolonged walking, suggesting a potential lumbar flexion bias. He presents with decreased R hip FL and R ankle DF strength primarily but demonstrates gross weakness in BLE. Pt should benefit from skilled PT intervention to address deficits and allow for improved tolerance with functional activities. Pt has a desire to return to Native American dancing in addition to being able to ambulate without pain and difficulty.    Personal Factors and Comorbidities Age;Comorbidity 3+;Past/Current Experience;Time since onset of injury/illness/exacerbation;Fitness    Comorbidities See PMH above    Examination-Activity Limitations Bend;Carry;Lift;Locomotion Level;Dressing;Stairs;Squat    Examination-Participation Restrictions Community Activity;Shop  Stability/Clinical Decision Making Evolving/Moderate complexity    Clinical Decision Making  Moderate    Rehab Potential Good    PT Frequency 1x / week    PT Duration 8 weeks    PT Treatment/Interventions ADLs/Self Care Home Management;Aquatic Therapy;Cryotherapy;Electrical Stimulation;Iontophoresis '4mg'$ /ml Dexamethasone;Moist Heat;Traction;Neuromuscular re-education;Balance training;Therapeutic exercise;Therapeutic activities;Functional mobility training;Stair training;Gait training;Patient/family education;Manual techniques;Energy conservation;Passive range of motion;Dry needling;Taping    PT Next Visit Plan Assess and update HEP. Further assess lumbar spine. Progress core/LE strengthening within tolerance. Initiate static balance activities.    PT Home Exercise Plan [id number] - HS stretch (seated and/or supine with belt/strap), bridges    Consulted and Agree with Plan of Care Patient           Patient will benefit from skilled therapeutic intervention in order to improve the following deficits and impairments:  Decreased activity tolerance,Decreased balance,Decreased mobility,Decreased strength,Decreased endurance,Decreased range of motion,Difficulty walking,Increased muscle spasms,Impaired flexibility,Postural dysfunction,Improper body mechanics,Pain  Visit Diagnosis: Muscle weakness (generalized)  Difficulty in walking, not elsewhere classified  Chronic midline low back pain, unspecified whether sciatica present     Problem List Patient Active Problem List   Diagnosis Date Noted  . Night sweats 10/20/2020  . Subconjunctival hemorrhage of right eye 09/04/2020  . Hypoglycemia 08/03/2020  . Diarrhea 08/02/2020  . Hypertensive emergency 07/27/2020  . Nephrotic range proteinuria 07/27/2020  . Acute stress reaction 07/25/2020  . Chronic kidney disease, stage 5 ([place]) 03/07/2020  . Hematuria 03/07/2020  . Foot callus 07/01/2018  . Hyperlipidemia 05/28/2016  . Hypertriglyceridemia 05/28/2016  . AKI (acute kidney injury) ([place]) 10/09/2012  . Type 2 diabetes mellitus with  stage 4 chronic kidney disease, without long-term current use of insulin ([place]) 06/01/2012  . Hypertension 04/14/2012  . DM (diabetes mellitus), type 2, uncontrolled ([place]) 04/13/2012     Haydee Monica, PT, DPT 10/25/20 2:28 PM  [facility] [address] [place], [place], [address] Phone: [phone]   Fax:  [phone]  Name: Robbie Dou MRN: [id number] Date of Birth: 10-16-1965

## 2020-10-30 ENCOUNTER — Ambulatory Visit: Payer: 59 | Admitting: Physical Therapy

## 2020-10-31 ENCOUNTER — Ambulatory Visit (INDEPENDENT_AMBULATORY_CARE_PROVIDER_SITE_OTHER): Payer: 59 | Admitting: Vascular Surgery

## 2020-10-31 ENCOUNTER — Ambulatory Visit (HOSPITAL_COMMUNITY)
Admission: RE | Admit: 2020-10-31 | Discharge: 2020-10-31 | Disposition: A | Discharge status: Home / Self Care | Payer: 59 | Source: Ambulatory Visit | Attending: Vascular Surgery | Admitting: Vascular Surgery

## 2020-10-31 ENCOUNTER — Other Ambulatory Visit: Payer: Self-pay

## 2020-10-31 ENCOUNTER — Ambulatory Visit (INDEPENDENT_AMBULATORY_CARE_PROVIDER_SITE_OTHER)
Admission: RE | Admit: 2020-10-31 | Discharge: 2020-10-31 | Disposition: A | Discharge status: Home / Self Care | Payer: 59 | Source: Ambulatory Visit | Attending: Vascular Surgery | Admitting: Vascular Surgery

## 2020-10-31 ENCOUNTER — Encounter: Payer: Self-pay | Admitting: Vascular Surgery

## 2020-10-31 VITALS — BP 181/81 | HR 63 | Temp 98.0°F | Resp 18 | Ht 70.0 in | Wt 185.0 lb

## 2020-10-31 DIAGNOSIS — N185 Chronic kidney disease, stage 5: Secondary | ICD-10-CM

## 2020-10-31 NOTE — Progress Notes (Signed)
Patient name: [name] MRN: [id number] DOB: 1965/11/27 Sex: male  REASON FOR CONSULT: Evaluate for permanent dialysis access  HPI: [name] is a 55 y.o. male, with stage V CKD, diabetes, hypertension, and hyperlipidemia that presents for permanent dialysis access evaluation.  Patient states he is right-handed.  He has had no previous access in the past.  He is a retired from Development worker, community.  He has started dialysis through a tunneled right IJ catheter.  Currently on dialysis Tuesday, Thursday, Saturday.  No blood thinners.  Past Medical History:  Diagnosis Date  . Allergy   . CKD (chronic kidney disease), stage IV ([place])   . Diabetes mellitus    Type 2 DM, Patient reports DM Type 1 diagnosd age at age 65  . Diabetic foot ulcers ([place])   . Foreign body in left foot    with  infection  . Hypertension   . Meniscus tear   . Multiple rib fractures   . Personal history of diabetic foot ulcer, Left Foot     Past Surgical History:  Procedure Laterality Date  . EYE SURGERY    . I & D EXTREMITY  04/23/2012   Procedure: IRRIGATION AND DEBRIDEMENT EXTREMITY;  Surgeon: Newt Minion, MD;  Location: [facility];  Service: Orthopedics;  Laterality: Left;  Irrigation and Debridement Left Foot  . I & D EXTREMITY  09/18/2012   Procedure: IRRIGATION AND DEBRIDEMENT EXTREMITY;  Surgeon: Newt Minion, MD;  Location: [facility];  Service: Orthopedics;  Laterality: Left;  . IR PERC TUN PERIT CATH WO PORT S&I Dartha Lodge  08/30/2020  . IR US GUIDE VASC ACCESS RIGHT  08/30/2020  . MENISCUS REPAIR Left 04/2016   Orthopedist, Dr [name] in [place], [place]    Family History  Problem Relation Age of Onset  . Diabetes type II Mother   . Arthritis Mother   . Diabetes Mother   . Hyperlipidemia Mother   . Hypertension Mother   . Diabetes type II Sister   . Diabetes Father     SOCIAL HISTORY: Social History   Socioeconomic History  . Marital status: Married    Spouse name: Not on file   . Number of children: Not on file  . Years of education: Not on file  . Highest education level: Not on file  Occupational History  . Not on file  Tobacco Use  . Smoking status: Former Smoker    Types: Cigarettes    Quit date: 09/03/2012    Years since quitting: 8.1  . Smokeless tobacco: Former Systems developer    Quit date: 09/03/2012  Substance and Sexual Activity  . Alcohol use: Yes    Alcohol/week: 8.0 standard drinks    Types: 6 Cans of beer, 2 Shots of liquor per week    Comment: weekends.  . Drug use: No  . Sexual activity: Yes  Other Topics Concern  . 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    Social Determinants of Health   Financial Resource Strain: Not on file  Food Insecurity: Not on file  Transportation Needs: Not on file  Physical Activity: Not on file  Stress: Not on file  Social Connections: Not on file  Intimate Partner Violence: Not on file    Allergies  Allergen Reactions  . Latex Rash and Other (See Comments)    Pt states he is allergic to latex condoms  .  Ace Inhibitors Cough  . Wound Dressing Adhesive Itching and Rash    Allergy to tape adhesive    Current Outpatient Medications  Medication Sig Dispense Refill  . acetaminophen (TYLENOL) 325 MG tablet Take 650 mg by mouth every 6 (six) hours as needed for mild pain or headache.     [name] amLODipine (NORVASC) 10 MG tablet Take 1 tablet (10 mg total) by mouth at bedtime. 90 tablet 3  . amoxicillin (AMOXIL) 500 MG capsule Take 1 capsule (500 mg total) by mouth 3 (three) times daily. 4 capsule 0  . Blood Glucose Monitoring Suppl (BLOOD GLUCOSE MONITOR SYSTEM) w/Device KIT 1 kit by Does not apply route daily. 1 kit 0  . carvedilol (COREG) 12.5 MG tablet Take 1 tablet (12.5 mg total) by mouth 2 (two) times daily with a meal. 60 tablet 5  . furosemide (LASIX) 80 MG tablet Take 1 tablet (80 mg total) by mouth 2 (two) times daily. 60 tablet 0  .  hydrALAZINE (APRESOLINE) 50 MG tablet Take 50 mg by mouth 3 (three) times daily. (Patient not taking: Reported on 10/25/2020)    . LOKELMA 10 g PACK packet Take 10 g by mouth in the morning and at bedtime. (Patient not taking: Reported on 10/25/2020)    . sodium bicarbonate 650 MG tablet Take 1,300 mg by mouth 3 (three) times daily. (Patient not taking: Reported on 10/25/2020)    . sodium polystyrene (KAYEXALATE) 15 GM/60ML suspension Take 60 mLs (15 g total) by mouth daily. (Patient not taking: No sig reported) 360 mL 0  . terbinafine (LAMISIL) 1 % cream Apply 1 application topically 2 (two) times daily. For 1 week to arms (Patient not taking: No sig reported) 30 g 0   No current facility-administered medications for this visit.    REVIEW OF SYSTEMS:  [X]  denotes positive finding, [ ]  denotes negative finding Cardiac  Comments:  Chest pain or chest pressure:    Shortness of breath upon exertion:    Short of breath when lying flat:    Irregular heart rhythm:        Vascular    Pain in calf, thigh, or hip brought on by ambulation:    Pain in feet at night that wakes you up from your sleep:     Blood clot in your veins:    Leg swelling:         Pulmonary    Oxygen at home:    Productive cough:     Wheezing:         Neurologic    Sudden weakness in arms or legs:     Sudden numbness in arms or legs:     Sudden onset of difficulty speaking or slurred speech:    Temporary loss of vision in one eye:     Problems with dizziness:         Gastrointestinal    Blood in stool:     Vomited blood:         Genitourinary    Burning when urinating:     Blood in urine:        Psychiatric    Major depression:         Hematologic    Bleeding problems:    Problems with blood clotting too easily:        Skin    Rashes or ulcers:        Constitutional    Fever or chills:      PHYSICAL EXAM:  Vitals:   10/31/20 1337  BP: (!) 181/81  Pulse: 63  Resp: 18  Temp: 98 F (36.7 C)   TempSrc: Temporal  SpO2: 98%  Weight: 185 lb (83.9 kg)  Height: 5' 10"  (1.778 m)    GENERAL: The patient is a well-nourished male, in no acute distress. The vital signs are documented above. CARDIAC: There is a regular rate and rhythm.  VASCULAR:  Palpable radial and brachial pulses bilateral upper extremities Right IJ tunneled catheter PULMONARY: No respiratory distress. ABDOMEN: Soft and non-tender. MUSCULOSKELETAL: There are no major deformities or cyanosis. NEUROLOGIC: No focal weakness or paresthesias are detected. SKIN: There are no ulcers or rashes noted. PSYCHIATRIC: The patient has a normal affect.  DATA:   I independently reviewed his vein mapping and in the left arm which is his nondominant arm appears to have a usable basilic vein and likely usable cephalic vein as well.  Assessment/Plan:  55 year old male with stage V CKD that presents for permanent dialysis access evaluation.  Discussed plans for placement in his nondominant arm which would be his left arm.  In review of vein mapping looks like he has a possible cephalic vein and certainly a usable basilic vein and we discussed steps of surgery in detail.  I discussed this being done as an outpatient at [facility].  We talked about risk and benefits including risk of bleeding, infection, steal, failure to mature etc.  We will get him scheduled for Monday.   Marty Heck, MD [facility] Office: [phone]

## 2020-11-02 ENCOUNTER — Other Ambulatory Visit: Payer: Self-pay

## 2020-11-02 ENCOUNTER — Encounter (HOSPITAL_COMMUNITY): Payer: Self-pay | Admitting: Vascular Surgery

## 2020-11-02 NOTE — Progress Notes (Signed)
[name] denies chest pain or shortness of breath. Patient denies any s/s of Covid in hi,self or anyone in the household. [name] will be tested for Covid on 11/03/20 and knows that he will need to quarantine with his family. Mr [name] has type II diabetes, he is no longer on medications for diabetes. A1C on 09/14/20 was 5.5. [name] states that he has been eating well, he rarely checks CBG. I instructed patient to check CBG after awaking and every 2 hours until arrival  to the hospital.  I Instructed patient if CBG is less than 70 to drink 1/2 cup of a clear juice. Recheck CBG in 15 minutes, if CBG does not increase,  call pre- op desk at [phone] for further instructions. I

## 2020-11-03 ENCOUNTER — Other Ambulatory Visit (HOSPITAL_COMMUNITY)
Admission: RE | Admit: 2020-11-03 | Discharge: 2020-11-03 | Disposition: A | Discharge status: Home / Self Care | Payer: 59 | Source: Ambulatory Visit | Attending: Vascular Surgery | Admitting: Vascular Surgery

## 2020-11-03 DIAGNOSIS — Z01812 Encounter for preprocedural laboratory examination: Secondary | ICD-10-CM | POA: Insufficient documentation

## 2020-11-03 DIAGNOSIS — Z20822 Contact with and (suspected) exposure to covid-19: Secondary | ICD-10-CM | POA: Diagnosis not present

## 2020-11-03 LAB — SARS CORONAVIRUS 2 (TAT 6-24 HRS): SARS Coronavirus 2: NEGATIVE

## 2020-11-04 ENCOUNTER — Other Ambulatory Visit (HOSPITAL_COMMUNITY): Payer: 59

## 2020-11-05 NOTE — Anesthesia Preprocedure Evaluation (Addendum)
Anesthesia Evaluation  Patient identified by MRN, date of birth, ID band Patient awake    Reviewed: NPO status , Patient's Chart, lab work & pertinent test results  Airway Mallampati: II  TM Distance: >3 FB Neck ROM: Full    Dental  (+) Poor Dentition, Missing   Pulmonary neg pulmonary ROS, former smoker,    Pulmonary exam normal        Cardiovascular hypertension, Pt. on medications and Pt. on home beta blockers  Rhythm:Regular Rate:Normal     Neuro/Psych Anxiety negative neurological ROS     GI/Hepatic negative GI ROS, Neg liver ROS,   Endo/Other  diabetes, Poorly Controlled, Type 1, Insulin Dependent  Renal/GU ESRFRenal disease  negative genitourinary   Musculoskeletal negative musculoskeletal ROS (+)   Abdominal (+)  Abdomen: soft. Bowel sounds: normal.  Peds  Hematology negative hematology ROS (+)   Anesthesia Other Findings   Reproductive/Obstetrics                            Anesthesia Physical Anesthesia Plan  ASA: III  Anesthesia Plan: Regional and MAC   Post-op Pain Management:    Induction: Intravenous  PONV Risk Score and Plan: Propofol infusion and Ondansetron  Airway Management Planned: Simple Face Mask, Natural Airway and Nasal Cannula  Additional Equipment: None  Intra-op Plan:   Post-operative Plan:   Informed Consent: I have reviewed the patients History and Physical, chart, labs and discussed the procedure including the risks, benefits and alternatives for the proposed anesthesia with the patient or authorized representative who has indicated his/her understanding and acceptance.     Dental advisory given  Plan Discussed with: CRNA  Anesthesia Plan Comments: (Lab Results      Component                Value               Date                      WBC                      8.3                 10/20/2020                HGB                      12.9 (L)             10/20/2020                HCT                      39.6                10/20/2020                MCV                      95                  10/20/2020                PLT                      267  10/20/2020           Lab Results      Component                Value               Date                      NA                       140                 11/06/2020                K                        4.3                 11/06/2020                CO2                      16 (L)              09/01/2020                GLUCOSE                  99                  11/06/2020                BUN                      53 (H)              11/06/2020                CREATININE               6.70 (H)            11/06/2020                CALCIUM                  7.4 (L)             09/01/2020                GFRNONAA                 6 (L)               09/01/2020                GFRAA                    7 (L)               08/15/2020           )       Anesthesia Quick Evaluation

## 2020-11-06 ENCOUNTER — Other Ambulatory Visit: Payer: Self-pay

## 2020-11-06 ENCOUNTER — Ambulatory Visit (HOSPITAL_COMMUNITY): Payer: 59 | Admitting: Anesthesiology

## 2020-11-06 ENCOUNTER — Ambulatory Visit: Payer: 59 | Admitting: Physical Therapy

## 2020-11-06 ENCOUNTER — Encounter (HOSPITAL_COMMUNITY)
Admission: RE | Disposition: A | Discharge status: Home / Self Care | Payer: Self-pay | Source: Home / Self Care | Attending: Vascular Surgery

## 2020-11-06 ENCOUNTER — Ambulatory Visit (HOSPITAL_COMMUNITY)
Admission: RE | Admit: 2020-11-06 | Discharge: 2020-11-06 | Disposition: A | Discharge status: Home / Self Care | Payer: 59 | Attending: Vascular Surgery | Admitting: Vascular Surgery

## 2020-11-06 ENCOUNTER — Encounter (HOSPITAL_COMMUNITY): Payer: Self-pay | Admitting: Vascular Surgery

## 2020-11-06 DIAGNOSIS — Z79899 Other long term (current) drug therapy: Secondary | ICD-10-CM | POA: Insufficient documentation

## 2020-11-06 DIAGNOSIS — I129 Hypertensive chronic kidney disease with stage 1 through stage 4 chronic kidney disease, or unspecified chronic kidney disease: Secondary | ICD-10-CM | POA: Diagnosis not present

## 2020-11-06 DIAGNOSIS — Z87891 Personal history of nicotine dependence: Secondary | ICD-10-CM | POA: Insufficient documentation

## 2020-11-06 DIAGNOSIS — Z992 Dependence on renal dialysis: Secondary | ICD-10-CM | POA: Insufficient documentation

## 2020-11-06 DIAGNOSIS — E1022 Type 1 diabetes mellitus with diabetic chronic kidney disease: Secondary | ICD-10-CM | POA: Diagnosis present

## 2020-11-06 DIAGNOSIS — N185 Chronic kidney disease, stage 5: Secondary | ICD-10-CM | POA: Diagnosis not present

## 2020-11-06 HISTORY — PX: AV FISTULA PLACEMENT: SHX1204

## 2020-11-06 HISTORY — DX: End stage renal disease: N18.6

## 2020-11-06 LAB — POCT I-STAT, CHEM 8
BUN: 53 mg/dL — ABNORMAL HIGH (ref 6–20)
Calcium, Ion: 1.15 mmol/L (ref 1.15–1.40)
Chloride: 107 mmol/L (ref 98–111)
Creatinine, Ser: 6.7 mg/dL — ABNORMAL HIGH (ref 0.61–1.24)
Glucose, Bld: 99 mg/dL (ref 70–99)
HCT: 45 % (ref 39.0–52.0)
Hemoglobin: 15.3 g/dL (ref 13.0–17.0)
Potassium: 4.3 mmol/L (ref 3.5–5.1)
Sodium: 140 mmol/L (ref 135–145)
TCO2: 22 mmol/L (ref 22–32)

## 2020-11-06 LAB — GLUCOSE, CAPILLARY
Glucose-Capillary: 98 mg/dL (ref 70–99)
Glucose-Capillary: 109 mg/dL — ABNORMAL HIGH (ref 70–99)

## 2020-11-06 SURGERY — ARTERIOVENOUS (AV) FISTULA CREATION
Anesthesia: Monitor Anesthesia Care | Laterality: Left

## 2020-11-06 MED ORDER — LIDOCAINE HCL (PF) 2 % IJ SOLN
INTRAMUSCULAR | Status: DC | PRN
  Administered 2020-11-06: 400 mg via PERINEURAL

## 2020-11-06 MED ORDER — ONDANSETRON HCL 4 MG/2ML IJ SOLN
INTRAMUSCULAR | Status: DC | PRN
  Administered 2020-11-06: 4 mg via INTRAVENOUS

## 2020-11-06 MED ORDER — ORAL CARE MOUTH RINSE: 15.0000 mL | Freq: Once | OROMUCOSAL | Status: AC

## 2020-11-06 MED ORDER — CEFAZOLIN SODIUM-DEXTROSE 2-4 GM/100ML-% IV SOLN
2.0000 g | INTRAVENOUS | Status: AC
  Administered 2020-11-06: 2 g via INTRAVENOUS
  Filled 2020-11-06: qty 100

## 2020-11-06 MED ORDER — SODIUM CHLORIDE 0.9 % IV SOLN: INTRAVENOUS | Status: DC

## 2020-11-06 MED ORDER — SODIUM CHLORIDE 0.9 % IV SOLN
INTRAVENOUS | Status: AC
  Filled 2020-11-06: qty 1.2

## 2020-11-06 MED ORDER — MIDAZOLAM HCL 5 MG/5ML IJ SOLN
INTRAMUSCULAR | Status: DC | PRN
  Administered 2020-11-06 (×2): 1 mg via INTRAVENOUS

## 2020-11-06 MED ORDER — SODIUM CHLORIDE 0.9 % IV SOLN
INTRAVENOUS | Status: DC | PRN
  Administered 2020-11-06: 500 mL

## 2020-11-06 MED ORDER — DEXAMETHASONE SODIUM PHOSPHATE 10 MG/ML IJ SOLN
INTRAMUSCULAR | Status: DC | PRN
  Administered 2020-11-06: 5 mg via INTRAVENOUS

## 2020-11-06 MED ORDER — PROPOFOL 10 MG/ML IV BOLUS
INTRAVENOUS | Status: DC | PRN
  Administered 2020-11-06: 20 mg via INTRAVENOUS
  Administered 2020-11-06: 25 mg via INTRAVENOUS

## 2020-11-06 MED ORDER — HEPARIN SODIUM (PORCINE) 1000 UNIT/ML IJ SOLN
INTRAMUSCULAR | Status: DC | PRN
  Administered 2020-11-06: 3000 [IU] via INTRAVENOUS

## 2020-11-06 MED ORDER — CHLORHEXIDINE GLUCONATE 4 % EX LIQD: 60.0000 mL | Freq: Once | CUTANEOUS | Status: DC

## 2020-11-06 MED ORDER — OXYCODONE-ACETAMINOPHEN 5-325 MG PO TABS: 1.0000 | ORAL_TABLET | Freq: Four times a day (QID) | ORAL | 0 refills | Status: DC | PRN

## 2020-11-06 MED ORDER — LIDOCAINE HCL (PF) 1 % IJ SOLN
INTRAMUSCULAR | Status: AC
  Filled 2020-11-06: qty 30

## 2020-11-06 MED ORDER — 0.9 % SODIUM CHLORIDE (POUR BTL) OPTIME
TOPICAL | Status: DC | PRN
  Administered 2020-11-06: 1000 mL

## 2020-11-06 MED ORDER — FENTANYL CITRATE (PF) 250 MCG/5ML IJ SOLN
INTRAMUSCULAR | Status: AC
  Filled 2020-11-06: qty 5

## 2020-11-06 MED ORDER — MIDAZOLAM HCL 2 MG/2ML IJ SOLN
INTRAMUSCULAR | Status: AC
  Filled 2020-11-06: qty 2

## 2020-11-06 MED ORDER — BUPIVACAINE HCL (PF) 0.5 % IJ SOLN
INTRAMUSCULAR | Status: DC | PRN
  Administered 2020-11-06: 10 mL via PERINEURAL

## 2020-11-06 MED ORDER — CHLORHEXIDINE GLUCONATE 0.12 % MT SOLN
15.0000 mL | Freq: Once | OROMUCOSAL | Status: AC
  Administered 2020-11-06: 15 mL via OROMUCOSAL
  Filled 2020-11-06: qty 15

## 2020-11-06 MED ORDER — FENTANYL CITRATE (PF) 250 MCG/5ML IJ SOLN
INTRAMUSCULAR | Status: DC | PRN
  Administered 2020-11-06 (×2): 50 ug via INTRAVENOUS

## 2020-11-06 MED ORDER — PROPOFOL 500 MG/50ML IV EMUL
INTRAVENOUS | Status: DC | PRN
  Administered 2020-11-06: 100 ug/kg/min via INTRAVENOUS

## 2020-11-06 SURGICAL SUPPLY — 34 items
ARMBAND PINK RESTRICT EXTREMIT (MISCELLANEOUS) ×2 IMPLANT
BLADE CLIPPER SURG (BLADE) IMPLANT
CANISTER SUCT 3000ML PPV (MISCELLANEOUS) ×2 IMPLANT
CLIP VESOCCLUDE MED 6/CT (CLIP) ×2 IMPLANT
CLIP VESOCCLUDE SM WIDE 6/CT (CLIP) ×2 IMPLANT
COVER PROBE W GEL 5X96 (DRAPES) ×2 IMPLANT
COVER WAND RF STERILE (DRAPES) ×2 IMPLANT
DECANTER SPIKE VIAL GLASS SM (MISCELLANEOUS) ×2 IMPLANT
DERMABOND ADVANCED (GAUZE/BANDAGES/DRESSINGS) ×1
DERMABOND ADVANCED .7 DNX12 (GAUZE/BANDAGES/DRESSINGS) ×1 IMPLANT
ELECT REM PT RETURN 9FT ADLT (ELECTROSURGICAL) ×2
ELECTRODE REM PT RTRN 9FT ADLT (ELECTROSURGICAL) ×1 IMPLANT
GLOVE BIO SURGEON STRL SZ7.5 (GLOVE) IMPLANT
GLOVE SRG 8 PF TXTR STRL LF DI (GLOVE) ×1 IMPLANT
GLOVE SURG SS PI 6.5 STRL IVOR (GLOVE) ×4 IMPLANT
GLOVE SURG UNDER POLY LF SZ8 (GLOVE) ×2
GOWN STRL REUS W/ TWL LRG LVL3 (GOWN DISPOSABLE) ×2 IMPLANT
GOWN STRL REUS W/ TWL XL LVL3 (GOWN DISPOSABLE) ×2 IMPLANT
GOWN STRL REUS W/TWL LRG LVL3 (GOWN DISPOSABLE) ×4
GOWN STRL REUS W/TWL XL LVL3 (GOWN DISPOSABLE) ×4
HEMOSTAT SPONGE AVITENE ULTRA (HEMOSTASIS) IMPLANT
KIT BASIN OR (CUSTOM PROCEDURE TRAY) ×2 IMPLANT
KIT TURNOVER KIT B (KITS) ×2 IMPLANT
NS IRRIG 1000ML POUR BTL (IV SOLUTION) ×2 IMPLANT
PACK CV ACCESS (CUSTOM PROCEDURE TRAY) ×2 IMPLANT
PAD ARMBOARD 7.5X6 YLW CONV (MISCELLANEOUS) ×4 IMPLANT
SUT MNCRL AB 4-0 PS2 18 (SUTURE) ×2 IMPLANT
SUT PROLENE 6 0 BV (SUTURE) ×2 IMPLANT
SUT PROLENE 7 0 BV 1 (SUTURE) IMPLANT
SUT VIC AB 3-0 SH 27 (SUTURE) ×2
SUT VIC AB 3-0 SH 27X BRD (SUTURE) ×1 IMPLANT
TOWEL GREEN STERILE (TOWEL DISPOSABLE) ×2 IMPLANT
UNDERPAD 30X36 HEAVY ABSORB (UNDERPADS AND DIAPERS) ×2 IMPLANT
WATER STERILE IRR 1000ML POUR (IV SOLUTION) ×2 IMPLANT

## 2020-11-06 NOTE — Anesthesia Procedure Notes (Signed)
Anesthesia Regional Block: Supraclavicular block   Pre-Anesthetic Checklist: ,, timeout performed, Correct Patient, Correct Site, Correct Laterality, Correct Procedure, Correct Position, site marked, Risks and benefits discussed,  Surgical consent,  Pre-op evaluation,  At surgeon's request and post-op pain management  Laterality: Left  Prep: Dura Prep       Needles:  Injection technique: Single-shot  Needle Type: Echogenic Stimulator Needle     Needle Length: 5cm  Needle Gauge: 20     Additional Needles:   Procedures:,,,, ultrasound used (permanent image in chart),,,,  Narrative:  Start time: 11/06/2020 7:14 AM End time: 11/06/2020 7:18 AM Injection made incrementally with aspirations every 5 mL.  Performed by: Personally  Anesthesiologist: Darral Dash, DO  Additional Notes: Patient identified. Risks/Benefits/Options discussed with patient including but not limited to bleeding, infection, nerve damage, failed block, incomplete pain control. Patient expressed understanding and wished to proceed. All questions were answered. Sterile technique was used throughout the entire procedure. Please see nursing notes for vital signs. Aspirated in 5cc intervals with injection for negative confirmation. Patient was given instructions on fall risk and not to get out of bed. All questions and concerns addressed with instructions to call with any issues or inadequate analgesia.

## 2020-11-06 NOTE — Anesthesia Procedure Notes (Signed)
Procedure Name: MAC Date/Time: 11/06/2020 7:58 AM Performed by: Imagene Riches, CRNA Pre-anesthesia Checklist: Patient identified, Emergency Drugs available, Suction available, Patient being monitored and Timeout performed Patient Re-evaluated:Patient Re-evaluated prior to induction Oxygen Delivery Method: Simple face mask

## 2020-11-06 NOTE — Transfer of Care (Signed)
Immediate Anesthesia Transfer of Care Note  Patient: [name]  Procedure(s) Performed: LEFT ARM FIRST STAGE BRACHIAL BASILIC ARTERIOVENOUS (AV) FISTULA CREATION (Left )  Patient Location: PACU  Anesthesia Type:MAC combined with regional for post-op pain  Level of Consciousness: awake and alert   Airway & Oxygen Therapy: Patient Spontanous Breathing  Post-op Assessment: Report given to RN and Post -op Vital signs reviewed and stable  Post vital signs: Reviewed and stable  Last Vitals:  Vitals Value Taken Time  BP 111/66 11/06/20 0847  Temp    Pulse 55 11/06/20 0849  Resp 19 11/06/20 0849  SpO2 98 % 11/06/20 0849  Vitals shown include unvalidated device data.  Last Pain:  Vitals:   11/06/20 0605  TempSrc:   PainSc: 0-No pain         Complications: No complications documented.

## 2020-11-06 NOTE — Op Note (Signed)
OPERATIVE NOTE   PROCEDURE: left first stage basilic vein transposition (brachiobasilic arteriovenous fistula) placement  PRE-OPERATIVE DIAGNOSIS: Stage V CKD  POST-OPERATIVE DIAGNOSIS: same  SURGEON: Marty Heck, MD  ASSISTANT(S): Roxy Horseman, PA  ANESTHESIA: MAC  ESTIMATED BLOOD LOSS: <25 mL  FINDING(S): 1.  Basilic vein: 4 mm, acceptable 2.  Brachial artery: 5 mm, disease free 3.  Venous outflow: palpable thrill  4.  Radial flow: palpable radial pulse  SPECIMEN(S):  none  INDICATIONS:   [name] is a 55 y.o. male who presents with stage V CKD and need for permanent hemodialysis access.  The patient is scheduled for left arm AVF.  The patient is aware the risks include but are not limited to: bleeding, infection, steal syndrome, nerve damage, ischemic monomelic neuropathy, failure to mature, and need for additional procedures.  The patient is aware of the risks of the procedure and elects to proceed forward.  An assistant was needed for exposure and to expedite the case.   DESCRIPTION: After full informed written consent was obtained from the patient, the patient was brought back to the operating room and placed supine upon the operating table.  Prior to induction, the patient received IV antibiotics.   After obtaining adequate anesthesia, the patient was then prepped and draped in the standard fashion for a left arm access procedure.  I turned my attention first to identifying the patient's cephalic vein and this was 4 mm at the antevubital fossa but immediately branches and was very small in the mid to upper arm.  I then evaluated the basilic vein that was 4 mm throughout the upper arm.    Using SonoSite guidance, the location of the basilic vein and brachial artery was marked out on the skin.  I made a transverse incision below the level of the antecubitum and dissected through the subcutaneous tissue and fascia to gain exposure of the brachial  artery.  This was noted to be 5 mm in diameter externally.  This was dissected out proximally and distally and controlled with vessel loops .  I then dissected out the basilic vein.  This was noted to be 4 mm in diameter externally.  The distal segment of the vein was ligated with a  2-0 silk, and the vein was transected.  The proximal segment was interrogated with serial dilators.  The vein accepted up to a 5 mm dilator without any difficulty.  I then instilled the heparinized saline into the vein and clamped it.  The patient was given 3000 units IV heparin.  At this point, I reset my exposure of the brachial artery and placed the artery under tension proximally and distally.  I made an arteriotomy with a #11 blade, and then I extended the arteriotomy with a Potts scissor. The vein was then sewn to the artery in an end-to-side configuration with a running stitch of 6-0 Prolene.  Prior to completing this anastomosis, I allowed the vein and artery to backbleed.  There was no evidence of clot from any vessels.  I completed the anastomosis in the usual fashion and then released all vessel loops and clamps.    There was a palpable thrill in the venous outflow, and there was a palpable radial pulse.  At this point, I irrigated out the surgical wound.  There was no further active bleeding.  The subcutaneous tissue was reapproximated with a running stitch of 3-0 Vicryl.  The skin was then reapproximated with a running subcuticular stitch  of 4-0 Monocryl.  The skin was then cleaned, dried, and reinforced with Dermabond.  The patient tolerated this procedure well.   COMPLICATIONS: None  CONDITION: Stable  Marty Heck MD [facility] Office: [place]   11/06/2020, 8:33 AM

## 2020-11-06 NOTE — Discharge Instructions (Addendum)
° °  [facility] [place] ° °Discharge Instructions ° °AV Fistula or Graft Surgery for Dialysis Access ° °Please refer to the following instructions for your post-procedure care. Your surgeon or physician assistant will discuss any changes with you. ° °Activity ° °You may drive the day following your surgery, if you are comfortable and no longer taking prescription pain medication. Resume full activity as the soreness in your incision resolves. ° °Bathing/Showering ° °You may shower after you go home. Keep your incision dry for 48 hours. Do not soak in a bathtub, hot tub, or swim until the incision heals completely. You may not shower if you have a hemodialysis catheter. ° °Incision Care ° °Clean your incision with mild soap and water after 48 hours. Pat the area dry with a clean towel. You do not need a bandage unless otherwise instructed. Do not apply any ointments or creams to your incision. You may have skin glue on your incision. Do not peel it off. It will come off on its own in about one week. Your arm may swell a bit after surgery. To reduce swelling use pillows to elevate your arm so it is above your heart. Your doctor will tell you if you need to lightly wrap your arm with an ACE bandage. ° °Diet ° °Resume your normal diet. There are not special food restrictions following this procedure. In order to heal from your surgery, it is CRITICAL to get adequate nutrition. Your body requires vitamins, minerals, and protein. Vegetables are the best source of vitamins and minerals. Vegetables also provide the perfect balance of protein. Processed food has little nutritional value, so try to avoid this. ° °Medications ° °Resume taking all of your medications. If your incision is causing pain, you may take over-the counter pain relievers such as acetaminophen (Tylenol). If you were prescribed a stronger pain medication, please be aware these medications can cause nausea and constipation. Prevent  nausea by taking the medication with a snack or meal. Avoid constipation by drinking plenty of fluids and eating foods with high amount of fiber, such as fruits, vegetables, and grains. Do not take Tylenol if you are taking prescription pain medications. ° ° ° ° °Follow up °Your surgeon may want to see you in the office following your access surgery. If so, this will be arranged at the time of your surgery. ° °Please call us immediately for any of the following conditions: ° °Increased pain, redness, drainage (pus) from your incision site °Fever of 101 degrees or higher °Severe or worsening pain at your incision site °Hand pain or numbness. ° °Reduce your risk of vascular disease: ° °Stop smoking. If you would like help, call [facility] at 1-800-QUIT-NOW (1-800-784-8669) or [facility] at 336-586-4000 ° °Manage your cholesterol °Maintain a desired weight °Control your diabetes °Keep your blood pressure down ° °Dialysis ° °It will take several weeks to several months for your new dialysis access to be ready for use. Your surgeon will determine when it is OK to use it. Your nephrologist will continue to direct your dialysis. You can continue to use your Permcath until your new access is ready for use. ° °If you have any questions, please call the office at [phone]. ° °

## 2020-11-06 NOTE — Progress Notes (Signed)
Orthopedic Tech Progress Note Patient Details:  [name] [birth year] [id number] PACU RN called requesting an ARM SLING Ortho Devices Type of Ortho Device: Arm sling Ortho Device/Splint Location: LUE Ortho Device/Splint Interventions: Ordered,Adjustment   Post Interventions Patient Tolerated: Well Instructions Provided: Care of device   Janit Pagan 11/06/2020, 9:24 AM

## 2020-11-06 NOTE — Anesthesia Postprocedure Evaluation (Signed)
Anesthesia Post Note  Patient: [name]  Procedure(s) Performed: LEFT ARM FIRST STAGE BRACHIAL BASILIC ARTERIOVENOUS (AV) FISTULA CREATION (Left )     Patient location during evaluation: PACU Anesthesia Type: Regional and MAC Level of consciousness: awake and alert Pain management: pain level controlled Vital Signs Assessment: post-procedure vital signs reviewed and stable Respiratory status: spontaneous breathing, nonlabored ventilation, respiratory function stable and patient connected to nasal cannula oxygen Cardiovascular status: stable and blood pressure returned to baseline Postop Assessment: no apparent nausea or vomiting Anesthetic complications: no   No complications documented.  Last Vitals:  Vitals:   11/06/20 0900 11/06/20 0915  BP: 128/64 132/64  Pulse: (!) 54 (!) 54  Resp: 19 16  Temp:    SpO2: 98% 96%    Last Pain:  Vitals:   11/06/20 0915  TempSrc:   PainSc: (P) 0-No pain                 Belenda Cruise P Leonila Speranza

## 2020-11-06 NOTE — H&P (Signed)
History and Physical Interval Note:  11/06/2020 7:21 AM  [name]  has presented today for surgery, with the diagnosis of ESRD.  The various methods of treatment have been discussed with the patient and family. After consideration of risks, benefits and other options for treatment, the patient has consented to  Procedure(s): LEFT ARM ARTERIOVENOUS (AV) FISTULA CREATION (Left) as a surgical intervention.  The patient's history has been reviewed, patient examined, no change in status, stable for surgery.  I have reviewed the patient's chart and labs.  Questions were answered to the patient's satisfaction.    Left arm AVF.  Marty Heck  Patient name: [name]     MRN: [id number]        DOB: [birth year]          Sex: male  REASON FOR CONSULT: Evaluate for permanent dialysis access  HPI: [name] is a 55 y.o. male, with stage V CKD, diabetes, hypertension, and hyperlipidemia that presents for permanent dialysis access evaluation.  Patient states he is right-handed.  He has had no previous access in the past.  He is a retired from Development worker, community.  He has started dialysis through a tunneled right IJ catheter.  Currently on dialysis Tuesday, Thursday, Saturday.  No blood thinners.      Past Medical History:  Diagnosis Date  . Allergy   . CKD (chronic kidney disease), stage IV ([place])   . Diabetes mellitus    Type 2 DM, Patient reports DM Type 1 diagnosd age at age 58  . Diabetic foot ulcers ([place])   . Foreign body in left foot    with  infection  . Hypertension   . Meniscus tear   . Multiple rib fractures   . Personal history of diabetic foot ulcer, Left Foot          Past Surgical History:  Procedure Laterality Date  . EYE SURGERY    . I & D EXTREMITY  04/23/2012   Procedure: IRRIGATION AND DEBRIDEMENT EXTREMITY;  Surgeon: Newt Minion, MD;  Location: [place];  Service: Orthopedics;  Laterality: Left;  Irrigation and  Debridement Left Foot  . I & D EXTREMITY  09/18/2012   Procedure: IRRIGATION AND DEBRIDEMENT EXTREMITY;  Surgeon: Newt Minion, MD;  Location: [place];  Service: Orthopedics;  Laterality: Left;  . IR PERC TUN PERIT CATH WO PORT S&I Dartha Lodge  08/30/2020  . IR US GUIDE VASC ACCESS RIGHT  08/30/2020  . MENISCUS REPAIR Left 04/2016   Orthopedist, Dr [name] in [place], [place]         Family History  Problem Relation Age of Onset  . Diabetes type II Mother   . Arthritis Mother   . Diabetes Mother   . Hyperlipidemia Mother   . Hypertension Mother   . Diabetes type II Sister   . Diabetes Father     SOCIAL HISTORY: Social History        Socioeconomic History  . Marital status: Married    Spouse name: Not on file  . Number of children: Not on file  . Years of education: Not on file  . Highest education level: Not on file  Occupational History  . Not on file  Tobacco Use  . Smoking status: Former Smoker    Types: Cigarettes    Quit date: 09/03/2012    Years since quitting: 8.1  . Smokeless tobacco: Former Systems developer    Quit date: 09/03/2012  Substance and Sexual Activity  .  Alcohol use: Yes    Alcohol/week: 8.0 standard drinks    Types: 6 Cans of beer, 2 Shots of liquor per week    Comment: weekends.  . Drug use: No  . Sexual activity: Yes  Other Topics Concern  . 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    Social Determinants of Health   Financial Resource Strain: Not on file  Food Insecurity: Not on file  Transportation Needs: Not on file  Physical Activity: Not on file  Stress: Not on file  Social Connections: Not on file  Intimate Partner Violence: Not on file         Allergies  Allergen Reactions  . Latex Rash and Other (See Comments)    Pt states he is allergic to latex condoms  . Ace Inhibitors Cough  . Wound Dressing Adhesive Itching and Rash     Allergy to tape adhesive          Current Outpatient Medications  Medication Sig Dispense Refill  . acetaminophen (TYLENOL) 325 MG tablet Take 650 mg by mouth every 6 (six) hours as needed for mild pain or headache.     [name] amLODipine (NORVASC) 10 MG tablet Take 1 tablet (10 mg total) by mouth at bedtime. 90 tablet 3  . amoxicillin (AMOXIL) 500 MG capsule Take 1 capsule (500 mg total) by mouth 3 (three) times daily. 4 capsule 0  . Blood Glucose Monitoring Suppl (BLOOD GLUCOSE MONITOR SYSTEM) w/Device KIT 1 kit by Does not apply route daily. 1 kit 0  . carvedilol (COREG) 12.5 MG tablet Take 1 tablet (12.5 mg total) by mouth 2 (two) times daily with a meal. 60 tablet 5  . furosemide (LASIX) 80 MG tablet Take 1 tablet (80 mg total) by mouth 2 (two) times daily. 60 tablet 0  . hydrALAZINE (APRESOLINE) 50 MG tablet Take 50 mg by mouth 3 (three) times daily. (Patient not taking: Reported on 10/25/2020)    . LOKELMA 10 g PACK packet Take 10 g by mouth in the morning and at bedtime. (Patient not taking: Reported on 10/25/2020)    . sodium bicarbonate 650 MG tablet Take 1,300 mg by mouth 3 (three) times daily. (Patient not taking: Reported on 10/25/2020)    . sodium polystyrene (KAYEXALATE) 15 GM/60ML suspension Take 60 mLs (15 g total) by mouth daily. (Patient not taking: No sig reported) 360 mL 0  . terbinafine (LAMISIL) 1 % cream Apply 1 application topically 2 (two) times daily. For 1 week to arms (Patient not taking: No sig reported) 30 g 0   No current facility-administered medications for this visit.    REVIEW OF SYSTEMS:  [X]  denotes positive finding, [ ]  denotes negative finding Cardiac  Comments:  Chest pain or chest pressure:    Shortness of breath upon exertion:    Short of breath when lying flat:    Irregular heart rhythm:        Vascular    Pain in calf, thigh, or hip brought on by ambulation:    Pain in feet at night that wakes you up from your sleep:      Blood clot in your veins:    Leg swelling:         Pulmonary    Oxygen at home:    Productive cough:     Wheezing:         Neurologic    Sudden weakness  in arms or legs:     Sudden numbness in arms or legs:     Sudden onset of difficulty speaking or slurred speech:    Temporary loss of vision in one eye:     Problems with dizziness:         Gastrointestinal    Blood in stool:     Vomited blood:         Genitourinary    Burning when urinating:     Blood in urine:        Psychiatric    Major depression:         Hematologic    Bleeding problems:    Problems with blood clotting too easily:        Skin    Rashes or ulcers:        Constitutional    Fever or chills:      PHYSICAL EXAM:    Vitals:   10/31/20 1337  BP: (!) 181/81  Pulse: 63  Resp: 18  Temp: 98 F (36.7 C)  TempSrc: Temporal  SpO2: 98%  Weight: 185 lb (83.9 kg)  Height: 5' 10"  (1.778 m)    GENERAL: The patient is a well-nourished male, in no acute distress. The vital signs are documented above. CARDIAC: There is a regular rate and rhythm.  VASCULAR:  Palpable radial and brachial pulses bilateral upper extremities Right IJ tunneled catheter PULMONARY: No respiratory distress. ABDOMEN: Soft and non-tender. MUSCULOSKELETAL: There are no major deformities or cyanosis. NEUROLOGIC: No focal weakness or paresthesias are detected. SKIN: There are no ulcers or rashes noted. PSYCHIATRIC: The patient has a normal affect.  DATA:   I independently reviewed his vein mapping and in the left arm which is his nondominant arm appears to have a usable basilic vein and likely usable cephalic vein as well.  Assessment/Plan:  55 year old male with stage V CKD that presents for permanent dialysis access evaluation.  Discussed plans for placement in his nondominant arm which would be his left arm.  In review of vein mapping  looks like he has a possible cephalic vein and certainly a usable basilic vein and we discussed steps of surgery in detail.  I discussed this being done as an outpatient at [facility].  We talked about risk and benefits including risk of bleeding, infection, steal, failure to mature etc.  We will get him scheduled for Monday.   Marty Heck, MD [facility] Office: [phone]

## 2020-11-07 ENCOUNTER — Encounter (HOSPITAL_COMMUNITY): Payer: Self-pay | Admitting: Vascular Surgery

## 2020-11-09 ENCOUNTER — Encounter (HOSPITAL_COMMUNITY): Payer: Self-pay | Admitting: Emergency Medicine

## 2020-11-09 ENCOUNTER — Other Ambulatory Visit: Payer: Self-pay

## 2020-11-09 ENCOUNTER — Ambulatory Visit (HOSPITAL_COMMUNITY)
Admission: EM | Admit: 2020-11-09 | Discharge: 2020-11-09 | Disposition: A | Discharge status: Home / Self Care | Payer: 59 | Attending: Emergency Medicine | Admitting: Emergency Medicine

## 2020-11-09 DIAGNOSIS — Z23 Encounter for immunization: Secondary | ICD-10-CM

## 2020-11-09 DIAGNOSIS — S61210A Laceration without foreign body of right index finger without damage to nail, initial encounter: Secondary | ICD-10-CM | POA: Diagnosis not present

## 2020-11-09 MED ORDER — TETANUS-DIPHTH-ACELL PERTUSSIS 5-2.5-18.5 LF-MCG/0.5 IM SUSY
PREFILLED_SYRINGE | INTRAMUSCULAR | Status: AC
  Filled 2020-11-09: qty 0.5

## 2020-11-09 MED ORDER — LIDOCAINE HCL 2 % IJ SOLN
INTRAMUSCULAR | Status: AC
  Filled 2020-11-09: qty 20

## 2020-11-09 MED ORDER — BACITRACIN ZINC 500 UNIT/GM EX OINT
TOPICAL_OINTMENT | CUTANEOUS | Status: AC
  Filled 2020-11-09: qty 28.35

## 2020-11-09 MED ORDER — TETANUS-DIPHTH-ACELL PERTUSSIS 5-2.5-18.5 LF-MCG/0.5 IM SUSY
0.5000 mL | PREFILLED_SYRINGE | Freq: Once | INTRAMUSCULAR | Status: AC
  Administered 2020-11-09: 0.5 mL via INTRAMUSCULAR

## 2020-11-09 NOTE — ED Triage Notes (Signed)
Pt presents with finger laceration after cutting finger on food can earlier today. Denies any numbness or tingling. Able to move finger.

## 2020-11-09 NOTE — Discharge Instructions (Signed)
Keep your wound clean and dry.  Wash it gently twice a day with soap and water.  Apply an antibiotic cream and bandage twice a day.    Return here in 7-10 days for removal of your stitches.  Return sooner if you see signs of infection, such as increased pain, redness, pus-like drainage, warmth, fever, chills, or other concerning symptoms.      Your tetanus was updated today.

## 2020-11-09 NOTE — ED Provider Notes (Signed)
[facility]    CSN: [id number] Arrival date & time: 11/09/20  1648      History   Chief Complaint Chief Complaint  Patient presents with  . Laceration    HPI [name] is a 55 y.o. male.   Patient presents with a laceration to his right index finger which occurred this afternoon when he cut his finger on a food can.  Bleeding controlled with direct pressure.  He denies numbness, weakness, paresthesias, or other symptoms..  Last tetanus > 20 years ago.  His medical history includes end-stage renal disease, hypertension, diabetes.  The history is provided by the patient and medical records.    Past Medical History:  Diagnosis Date  . Allergy   . Diabetes mellitus    Type 2 DM, Patient reports DM Type 1 diagnosd age at age 30  . Diabetic foot ulcers ([place])   . ESRD (end stage renal disease) ([place])    [facility]  . Foreign body in left foot    with  infection  . Hypertension   . Meniscus tear   . Multiple rib fractures   . Personal history of diabetic foot ulcer, Left Foot     Patient Active Problem List   Diagnosis Date Noted  . Night sweats 10/20/2020  . Subconjunctival hemorrhage of right eye 09/04/2020  . Hypoglycemia 08/03/2020  . Diarrhea 08/02/2020  . Hypertensive emergency 07/27/2020  . Nephrotic range proteinuria 07/27/2020  . Acute stress reaction 07/25/2020  . Chronic kidney disease, stage 5 ([place]) 03/07/2020  . Hematuria 03/07/2020  . Foot callus 07/01/2018  . Hyperlipidemia 05/28/2016  . Hypertriglyceridemia 05/28/2016  . AKI (acute kidney injury) ([place]) 10/09/2012  . Type 2 diabetes mellitus with stage 4 chronic kidney disease, without long-term current use of insulin ([place]) 06/01/2012  . Hypertension 04/14/2012  . DM (diabetes mellitus), type 2, uncontrolled ([place]) 04/13/2012    Past Surgical History:  Procedure Laterality Date  . AV FISTULA PLACEMENT Left 11/06/2020   Procedure: LEFT ARM FIRST STAGE BRACHIAL BASILIC  ARTERIOVENOUS (AV) FISTULA CREATION;  Surgeon: Marty Heck, MD;  Location: [place];  Service: Vascular;  Laterality: Left;  . EYE SURGERY Bilateral    [facility]  . I & D EXTREMITY  04/23/2012   Procedure: IRRIGATION AND DEBRIDEMENT EXTREMITY;  Surgeon: Newt Minion, MD;  Location: [place];  Service: Orthopedics;  Laterality: Left;  Irrigation and Debridement Left Foot  . I & D EXTREMITY  09/18/2012   Procedure: IRRIGATION AND DEBRIDEMENT EXTREMITY;  Surgeon: Newt Minion, MD;  Location: [place];  Service: Orthopedics;  Laterality: Left;  . IR PERC TUN PERIT CATH WO PORT S&I Dartha Lodge  08/30/2020  . IR US GUIDE VASC ACCESS RIGHT  08/30/2020  . MENISCUS REPAIR Left 04/2016   Orthopedist, Dr [name] in [place], [place]       Home Medications    Prior to Admission medications   Medication Sig Start Date End Date Taking? Authorizing Provider  acetaminophen (TYLENOL) 325 MG tablet Take 650 mg by mouth every 6 (six) hours as needed for mild pain or headache.     [provider]  amLODipine (NORVASC) 10 MG tablet Take 1 tablet (10 mg total) by mouth at bedtime. 08/02/20   Wilber Oliphant, MD  amoxicillin (AMOXIL) 500 MG capsule Take 1 capsule (500 mg total) by mouth 3 (three) times daily. 10/20/20   Gifford Shave, MD  Blood Glucose Monitoring Suppl (BLOOD GLUCOSE MONITOR SYSTEM) w/Device KIT 1  kit by Does not apply route daily. 02/22/20   Wilber Oliphant, MD  calcium elemental as carbonate (TUMS ULTRA 1000) 400 MG chewable tablet Chew 1,000 mg by mouth 3 (three) times daily.    [provider]  carvedilol (COREG) 12.5 MG tablet Take 1 tablet (12.5 mg total) by mouth 2 (two) times daily with a meal. 10/20/20   Wilber Oliphant, MD  furosemide (LASIX) 80 MG tablet Take 1 tablet (80 mg total) by mouth 2 (two) times daily. Patient not taking: No sig reported 09/06/20   Wilber Oliphant, MD  oxyCODONE-acetaminophen (PERCOCET/ROXICET) 5-325 MG tablet Take 1 tablet by mouth every 6 (six) hours as  needed. 11/06/20   Ulyses Amor, PA-C  sodium polystyrene (KAYEXALATE) 15 GM/60ML suspension Take 60 mLs (15 g total) by mouth daily. 08/05/20   Brimage, Ronnette Juniper, DO  terbinafine (LAMISIL) 1 % cream Apply 1 application topically 2 (two) times daily. For 1 week to arms 07/25/20   Martyn Malay, MD    Family History Family History  Problem Relation Age of Onset  . Diabetes type II Mother   . Arthritis Mother   . Diabetes Mother   . Hyperlipidemia Mother   . Hypertension Mother   . Diabetes type II Sister   . Diabetes Father     Social History Social History   Tobacco Use  . Smoking status: Former Smoker    Types: Cigarettes    Quit date: 09/03/2012    Years since quitting: 8.1  . Smokeless tobacco: Former Systems developer    Quit date: 09/03/2012  Substance Use Topics  . Alcohol use: Not Currently  . Drug use: Not Currently     Allergies   Latex, Ace inhibitors, and Wound dressing adhesive   Review of Systems Review of Systems  Constitutional: Negative for chills and fever.  HENT: Negative for ear pain and sore throat.   Eyes: Negative for pain and visual disturbance.  Respiratory: Negative for cough and shortness of breath.   Cardiovascular: Negative for chest pain and palpitations.  Gastrointestinal: Negative for abdominal pain and vomiting.  Genitourinary: Negative for dysuria and hematuria.  Musculoskeletal: Negative for arthralgias and back pain.  Skin: Positive for wound. Negative for color change.  Neurological: Negative for syncope, weakness and numbness.  All other systems reviewed and are negative.    Physical Exam Triage Vital Signs ED Triage Vitals  Enc Vitals Group     BP      Pulse      Resp      Temp      Temp src      SpO2      Weight      Height      Head Circumference      Peak Flow      Pain Score      Pain Loc      Pain Edu?      Excl. in [place]?    No data found.  Updated Vital Signs BP (!) 144/65 (BP Location: Right Arm)   Pulse 67    Temp 98.7 F (37.1 C) (Oral)   Resp 19   SpO2 100%   Visual Acuity Right Eye Distance:   Left Eye Distance:   Bilateral Distance:    Right Eye Near:   Left Eye Near:    Bilateral Near:     Physical Exam Vitals and nursing note reviewed.  Constitutional:      General: He is not in  acute distress.    Appearance: He is well-developed and well-nourished.  HENT:     Head: Normocephalic and atraumatic.     Mouth/Throat:     Mouth: Mucous membranes are moist.  Eyes:     Conjunctiva/sclera: Conjunctivae normal.  Cardiovascular:     Rate and Rhythm: Normal rate and regular rhythm.     Heart sounds: Normal heart sounds.  Pulmonary:     Effort: Pulmonary effort is normal. No respiratory distress.     Breath sounds: Normal breath sounds.  Abdominal:     Palpations: Abdomen is soft.     Tenderness: There is no abdominal tenderness.  Musculoskeletal:        General: No swelling, deformity or edema. Normal range of motion.     Cervical back: Neck supple.     Comments: Right index finger: FROM, strength 5/5, sensation intact.  Skin:    General: Skin is warm and dry.     Capillary Refill: Capillary refill takes less than 2 seconds.     Findings: Lesion present.     Comments: 1.5 cm laceration on right index finger.  Neurological:     General: No focal deficit present.     Mental Status: He is alert and oriented to person, place, and time.     Sensory: No sensory deficit.     Motor: No weakness.     Gait: Gait normal.  Psychiatric:        Mood and Affect: Mood and affect and mood normal.        Behavior: Behavior normal.      UC Treatments / Results  Labs (all labs ordered are listed, but only abnormal results are displayed) Labs Reviewed - No data to display  EKG   Radiology No results found.  Procedures Laceration Repair  Date/Time: 11/09/2020 6:12 PM Performed by: Sharion Balloon, NP Authorized by: Sharion Balloon, NP   Consent:    Consent obtained:  Verbal    Consent given by:  Patient   Risks discussed:  Infection, pain, poor cosmetic result and poor wound healing Universal protocol:    Procedure explained and questions answered to patient or proxy's satisfaction: yes   Anesthesia:    Anesthesia method:  Local infiltration   Local anesthetic:  Lidocaine 2% w/o epi Laceration details:    Location:  Finger   Finger location:  R index finger   Length (cm):  1.5   Depth (mm):  2 Pre-procedure details:    Preparation:  Patient was prepped and draped in usual sterile fashion Exploration:    Hemostasis achieved with:  Direct pressure Treatment:    Area cleansed with:  Povidone-iodine   Amount of cleaning:  Standard   Irrigation solution:  Sterile saline   Irrigation method:  Syringe   Visualized foreign bodies/material removed: no   Skin repair:    Repair method:  Sutures   Suture size:  5-0   Suture material:  Nylon   Number of sutures:  3 Approximation:    Approximation:  Close Repair type:    Repair type:  Simple Post-procedure details:    Dressing:  Antibiotic ointment and non-adherent dressing   Procedure completion:  Tolerated well, no immediate complications   (including critical care time)  Medications Ordered in UC Medications  Tdap (BOOSTRIX) injection 0.5 mL (0.5 mLs Intramuscular Given 11/09/20 1810)    Initial Impression / Assessment and Plan / UC Course  I have reviewed the triage vital signs and the  nursing notes.  Pertinent labs & imaging results that were available during my care of the patient were reviewed by me and considered in my medical decision making (see chart for details).   Laceration of right index finger.  3 sutures.  Tetanus updated.  Wound care instructions and signs of infection discussed.  Instructed patient to return here for suture removal in 7 to 10 days.  Instructed him to return sooner if he notes signs of infection.  He agrees to plan of care.   Final Clinical Impressions(s) / UC Diagnoses    Final diagnoses:  Laceration of right index finger without foreign body without damage to nail, initial encounter     Discharge Instructions     Keep your wound clean and dry.  Wash it gently twice a day with soap and water.  Apply an antibiotic cream and bandage twice a day.    Return here in 7-10 days for removal of your stitches.  Return sooner if you see signs of infection, such as increased pain, redness, pus-like drainage, warmth, fever, chills, or other concerning symptoms.      Your tetanus was updated today.         ED Prescriptions    None     PDMP not reviewed this encounter.   Sharion Balloon, NP 11/09/20 [phone]

## 2020-11-10 ENCOUNTER — Ambulatory Visit: Payer: 59 | Admitting: Podiatry

## 2020-11-13 ENCOUNTER — Other Ambulatory Visit: Payer: Self-pay

## 2020-11-13 ENCOUNTER — Ambulatory Visit: Payer: 59 | Attending: Family Medicine

## 2020-11-13 ENCOUNTER — Ambulatory Visit (INDEPENDENT_AMBULATORY_CARE_PROVIDER_SITE_OTHER): Payer: 59 | Admitting: Physician Assistant

## 2020-11-13 ENCOUNTER — Telehealth: Payer: Self-pay | Admitting: *Deleted

## 2020-11-13 VITALS — BP 140/69 | HR 63 | Temp 98.7°F | Resp 20 | Ht 70.0 in | Wt 188.0 lb

## 2020-11-13 DIAGNOSIS — M545 Low back pain, unspecified: Secondary | ICD-10-CM | POA: Diagnosis present

## 2020-11-13 DIAGNOSIS — R262 Difficulty in walking, not elsewhere classified: Secondary | ICD-10-CM

## 2020-11-13 DIAGNOSIS — Z992 Dependence on renal dialysis: Secondary | ICD-10-CM

## 2020-11-13 DIAGNOSIS — G8929 Other chronic pain: Secondary | ICD-10-CM | POA: Diagnosis present

## 2020-11-13 DIAGNOSIS — M6281 Muscle weakness (generalized): Secondary | ICD-10-CM | POA: Diagnosis present

## 2020-11-13 DIAGNOSIS — N186 End stage renal disease: Secondary | ICD-10-CM

## 2020-11-13 NOTE — Therapy (Addendum)
[place], [place], [address] Phone: [phone]   Fax:  [phone]  Physical Therapy Treatment/Discharge  Patient Details  Name: [name] MRN: [id number] Date of Birth: 13-Nov-1965 Referring Provider (PT): Martyn Malay, MD   Encounter Date: 11/13/2020   PT End of Session - 11/13/20 0921    Visit Number 2    Number of Visits 9    Date for PT Re-Evaluation 12/23/20    Authorization Type [employer] - FOTO visit 6 and visit 10    PT Start Time 0915    PT Stop Time 0940    PT Time Calculation (min) 25 min    Activity Tolerance Patient tolerated treatment well    Behavior During Therapy [facility] for tasks assessed/performed           Past Medical History:  Diagnosis Date  . Allergy   . Diabetes mellitus    Type 2 DM, Patient reports DM Type 1 diagnosd age at age 24  . Diabetic foot ulcers ([place])   . ESRD (end stage renal disease) ([place])    [facility]  . Foreign body in left foot    with  infection  . Hypertension   . Meniscus tear   . Multiple rib fractures   . Personal history of diabetic foot ulcer, Left Foot     Past Surgical History:  Procedure Laterality Date  . AV FISTULA PLACEMENT Left 11/06/2020   Procedure: LEFT ARM FIRST STAGE BRACHIAL BASILIC ARTERIOVENOUS (AV) FISTULA CREATION;  Surgeon: Marty Heck, MD;  Location: [place];  Service: Vascular;  Laterality: Left;  . EYE SURGERY Bilateral    lazer  . I & D EXTREMITY  04/23/2012   Procedure: IRRIGATION AND DEBRIDEMENT EXTREMITY;  Surgeon: Newt Minion, MD;  Location: [place];  Service: Orthopedics;  Laterality: Left;  Irrigation and Debridement Left Foot  . I & D EXTREMITY  09/18/2012   Procedure: IRRIGATION AND DEBRIDEMENT EXTREMITY;  Surgeon: Newt Minion, MD;  Location: [place];  Service: Orthopedics;  Laterality: Left;  . IR PERC TUN PERIT CATH WO PORT S&I Dartha Lodge  08/30/2020  . IR US GUIDE VASC ACCESS RIGHT   08/30/2020  . MENISCUS REPAIR Left 04/2016   Orthopedist, Dr [name] in [place], [place]    There were no vitals filed for this visit.   Subjective Assessment - 11/13/20 0918    Subjective "I had an operation for the fistula in my left arm and I also cut a finger on my R hand with a can and had 3 stitches in that. I had to cancel my appointment a couple weeks ago because of the weather and having to move my dialysis time and day."    Pertinent History See PMH above    Limitations Standing;Walking;Lifting    How long can you sit comfortably? No issues    How long can you stand comfortably? No issues    How long can you walk comfortably? "I walk around [place] - the entire store - once before I have to sit down. I push a cart usually, which helps me."    Patient Stated Goals "I just want to be more active and get my strength back."    Currently in Pain? No/denies    Pain Score 0-No pain    Pain Onset More than a month ago              [facility] PT Assessment - 11/13/20  0001      Assessment   Medical Diagnosis Generalized weakness (R53.1)    Referring Provider (PT) Martyn Malay, MD                         [facility] Adult PT Treatment/Exercise - 11/13/20 0001      Knee/Hip Exercises: Aerobic   Nustep L4 x 5 min LE only      Knee/Hip Exercises: Standing   Hip Abduction Stengthening;Both;2 sets;10 reps;Knee straight    Hip Extension Stengthening;Both;2 sets;10 reps;Knee straight    Other Standing Knee Exercises Tandem standing 3 x 20 sec each LE leading                    PT Short Term Goals - 10/25/20 1359      PT SHORT TERM GOAL #1   Title Pt will be independent with initial HEP.    Baseline Pt provided initial HEP at evaluation 10/25/2020.    Time 4    Period Weeks    Status New    Target Date 11/22/20      PT SHORT TERM GOAL #2   Title Pt's TUG time will improve from 13.87 seconds to </= 12 seconds to progress towards improved safety and decreased  fall risk with community ambulation.    Baseline 13.87 seconds    Time 4    Period Weeks    Status New    Target Date 11/22/20      PT SHORT TERM GOAL #3   Title Pt will be able to perform tandem standing for >/= 10 seconds on level ground without UE support to demonstrate improved balance.    Baseline Pt requires stepping strategy to maintain balance after 1-2 seconds in tandem stance.    Time 4    Period Weeks    Status New    Target Date 11/22/20      PT SHORT TERM GOAL #4   Title Pt's 5xSTS time will improve from 32.26 seconds to </= 28 seconds to demonstrate improved functional strength.    Baseline 32.26 seconds    Time 4    Period Weeks    Status New    Target Date 11/22/20      PT SHORT TERM GOAL #5   Title Pt's R hip FL and ankle DF MMT will improve to at least 4/5 and 4+/5 respectively.    Baseline MMT: R hip FL 3+/5 and R ankle DF 4-/5    Time 4    Period Weeks    Status New    Target Date 11/22/20             PT Long Term Goals - 10/25/20 1407      PT LONG TERM GOAL #1   Title Pt will be independent with advanced HEP.    Baseline Pt provided initial HEP at evaluation 10/25/2020.    Time 8    Period Weeks    Status New    Target Date 12/20/20      PT LONG TERM GOAL #2   Title Pt's TUG time will improve from 13.87 seconds to </= 10 seconds to demonstrate improved safety and decreased fall risk with community ambulation.    Baseline 13.87 seconds    Time 8    Period Weeks    Status New    Target Date 12/20/20      PT LONG TERM GOAL #3   Title Pt will  be able to perform SLS with each LE for >/= 10 seconds on level ground without UE support to demonstrate improved balance.    Baseline Pt requires stepping strategy to maintain balance after 1-2 seconds in tandem stance and unable to perform SLS without support    Time 8    Period Weeks    Status New    Target Date 12/20/20      PT LONG TERM GOAL #4   Title Pt's 5xSTS time will improve from 32.26  seconds to </= 25 seconds to demonstrate improved functional strength.    Baseline 32.26 seconds    Time 8    Period Weeks    Status New    Target Date 12/20/20      PT LONG TERM GOAL #5   Title Pt's FOTO score will increase from 42% to 55% functional status to demonstrate improved perceived ability.    Baseline 42% functional status; predicted 55% function    Time 8    Period Weeks    Status New    Target Date 12/20/20                 Plan - 11/13/20 9373    Clinical Impression Statement Patient tolerated performed interventions well but had to leave abruptly due to GI discomfort. He needed to use restroom after initial standing exercises then again almost immediately after returning to begin next exercise. Pt denies needing to sit or have water and apologetically states that he just needs to return home. He should continue to benefit from skilled PT to improve LE strength and balance.    Personal Factors and Comorbidities Age;Comorbidity 3+;Past/Current Experience;Time since onset of injury/illness/exacerbation;Fitness    Comorbidities See PMH above    Examination-Activity Limitations Bend;Carry;Lift;Locomotion Level;Dressing;Stairs;Squat    Examination-Participation Restrictions Community Activity;Shop    Stability/Clinical Decision Making Evolving/Moderate complexity    Rehab Potential Good    PT Frequency 1x / week    PT Duration 8 weeks    PT Treatment/Interventions ADLs/Self Care Home Management;Aquatic Therapy;Cryotherapy;Electrical Stimulation;Iontophoresis 43m/ml Dexamethasone;Moist Heat;Traction;Neuromuscular re-education;Balance training;Therapeutic exercise;Therapeutic activities;Functional mobility training;Stair training;Gait training;Patient/family education;Manual techniques;Energy conservation;Passive range of motion;Dry needling;Taping    PT Next Visit Plan Assess and update HEP. Further assess lumbar spine. Progress core/LE strengthening within tolerance.  Continue static balance activities.    PT Home Exercise Plan [id number] - HS stretch (seated and/or supine with belt/strap), bridges    Consulted and Agree with Plan of Care Patient           Patient will benefit from skilled therapeutic intervention in order to improve the following deficits and impairments:  Decreased activity tolerance,Decreased balance,Decreased mobility,Decreased strength,Decreased endurance,Decreased range of motion,Difficulty walking,Increased muscle spasms,Impaired flexibility,Postural dysfunction,Improper body mechanics,Pain  Visit Diagnosis: Muscle weakness (generalized)  Difficulty in walking, not elsewhere classified  Chronic midline low back pain, unspecified whether sciatica present     Problem List Patient Active Problem List   Diagnosis Date Noted  . Night sweats 10/20/2020  . Subconjunctival hemorrhage of right eye 09/04/2020  . Hypoglycemia 08/03/2020  . Diarrhea 08/02/2020  . Hypertensive emergency 07/27/2020  . Nephrotic range proteinuria 07/27/2020  . Acute stress reaction 07/25/2020  . Chronic kidney disease, stage 5 ([place] 03/07/2020  . Hematuria 03/07/2020  . Foot callus 07/01/2018  . Hyperlipidemia 05/28/2016  . Hypertriglyceridemia 05/28/2016  . AKI (acute kidney injury) ([place] 10/09/2012  . Type 2 diabetes mellitus with stage 4 chronic kidney disease, without long-term current use of insulin ([place] 06/01/2012  . Hypertension 04/14/2012  .  DM (diabetes mellitus), type 2, uncontrolled ([place]) 04/13/2012   PHYSICAL THERAPY DISCHARGE SUMMARY  Visits from Start of Care: 2  Current functional level related to goals / functional outcomes: See above   Remaining deficits: See above   Education / Equipment: See above  Plan: Patient agrees to discharge.  Patient goals were not met. Patient is being discharged due to financial reasons.  ?????    Patient called to cancel all future appointments at this time secondary to  surgery.    Haydee Monica, PT, DPT 12/07/20 11:34 AM  [facility] [address] [place], [place], [address] Phone: [phone]   Fax:  [phone]  Name: [name] MRN: [id number] Date of Birth: 31-Jan-1966

## 2020-11-13 NOTE — Progress Notes (Signed)
  POST OPERATIVE OFFICE NOTE    CC:  F/u for surgery  HPI:  This is a 55 y.o. male who is s/p first stage basilic fistula on 8/67/51 by Dr. Carlis Abbott.  He called with a CC of decreased sensation in the thumb and pointer finger and edema at the incision.  This is his first access surgery.  He is on HD via [facility].  He denise fever and chills.    Allergies  Allergen Reactions  . Latex Rash and Other (See Comments)    Pt states he is allergic to latex condoms  . Ace Inhibitors Cough  . Wound Dressing Adhesive Itching and Rash    Allergy to tape adhesive    Current Outpatient Medications  Medication Sig Dispense Refill  . acetaminophen (TYLENOL) 325 MG tablet Take 650 mg by mouth every 6 (six) hours as needed for mild pain or headache.     [name] amLODipine (NORVASC) 10 MG tablet Take 1 tablet (10 mg total) by mouth at bedtime. 90 tablet 3  . Blood Glucose Monitoring Suppl (BLOOD GLUCOSE MONITOR SYSTEM) w/Device KIT 1 kit by Does not apply route daily. 1 kit 0  . calcium elemental as carbonate (TUMS ULTRA 1000) 400 MG chewable tablet Chew 1,000 mg by mouth 3 (three) times daily.    . carvedilol (COREG) 12.5 MG tablet Take 1 tablet (12.5 mg total) by mouth 2 (two) times daily with a meal. 60 tablet 5  . hydrALAZINE (APRESOLINE) 50 MG tablet Take 50 mg by mouth 3 (three) times daily.     No current facility-administered medications for this visit.     ROS:  See HPI  Physical Exam:    Incision:  Well healed without erythema or abnromal edema, no hematoma. Extremities:  Sensation grossly intact, decreased pointer finger and thumb.  Grip is equal, left hand is slightly cooler than right to touch.  Doppler signals brisk radial, ulnar and palmer.     Assessment/Plan:  This is a 55 y.o. male who is s/p:first stage left basilic with mild steal symptoms.  He has not been using his left UE for activities.  He has a minimal elbow contracture.   He does not have pain.  I gave him a squeeze ball and  instructed him to use the left UE for everyday activities.  He will f/u in 1-2 weeks if his symptoms get worse or are intolerable he will need a ligation of the fistula.  I donet think his symptoms are bas enough to ligate it at this point.    Roxy Horseman PA-C [facility] [phone]  Clinic MD:  Trula Slade

## 2020-11-13 NOTE — Telephone Encounter (Signed)
Patient called c/o "a bubble" being over his AVF incision and it appears the incision could be "opening".  Patient states his index finger and thumb are still "numb".  Patient denies fever, redness or drainage.  An appointment was scheduled today, 11/13/2020.

## 2020-11-20 ENCOUNTER — Ambulatory Visit: Payer: 59

## 2020-11-24 ENCOUNTER — Other Ambulatory Visit: Payer: Self-pay | Admitting: *Deleted

## 2020-11-24 DIAGNOSIS — N186 End stage renal disease: Secondary | ICD-10-CM

## 2020-11-24 DIAGNOSIS — Z992 Dependence on renal dialysis: Secondary | ICD-10-CM

## 2020-11-27 ENCOUNTER — Ambulatory Visit: Payer: 59

## 2020-11-27 ENCOUNTER — Telehealth: Payer: Self-pay

## 2020-11-27 NOTE — Telephone Encounter (Signed)
PT spoke with patient regarding no show this morning. Pt apologetically explains that he thought the clinic would be closed to observe President's Day. PT explained that the clinic is open, reviewed attendance policy, and confirmed next appointment. Informed patient that he may call and inquire about same day availability if he would like to come in for an appointment this week.  Haydee Monica, PT, DPT 11/27/20 3:04 PM

## 2020-11-29 ENCOUNTER — Encounter: Payer: Self-pay | Admitting: Family Medicine

## 2020-12-01 ENCOUNTER — Other Ambulatory Visit: Payer: Self-pay | Admitting: *Deleted

## 2020-12-01 ENCOUNTER — Encounter (HOSPITAL_COMMUNITY): Payer: Self-pay

## 2020-12-01 ENCOUNTER — Other Ambulatory Visit (HOSPITAL_COMMUNITY)
Admission: RE | Admit: 2020-12-01 | Discharge: 2020-12-01 | Disposition: A | Discharge status: Home / Self Care | Payer: 59 | Source: Ambulatory Visit | Attending: Vascular Surgery | Admitting: Vascular Surgery

## 2020-12-01 ENCOUNTER — Ambulatory Visit (INDEPENDENT_AMBULATORY_CARE_PROVIDER_SITE_OTHER): Payer: 59 | Admitting: Physician Assistant

## 2020-12-01 ENCOUNTER — Ambulatory Visit (HOSPITAL_COMMUNITY): Admission: EM | Admit: 2020-12-01 | Discharge: 2020-12-01 | Discharge status: Against Medical Advice | Payer: 59

## 2020-12-01 ENCOUNTER — Encounter: Payer: Self-pay | Admitting: *Deleted

## 2020-12-01 ENCOUNTER — Ambulatory Visit (HOSPITAL_COMMUNITY)
Admission: EM | Admit: 2020-12-01 | Discharge: 2020-12-01 | Disposition: A | Discharge status: Home / Self Care | Payer: 59 | Attending: Emergency Medicine | Admitting: Emergency Medicine

## 2020-12-01 ENCOUNTER — Telehealth: Payer: Self-pay

## 2020-12-01 ENCOUNTER — Other Ambulatory Visit: Payer: Self-pay

## 2020-12-01 VITALS — BP 154/66 | HR 66 | Temp 98.2°F | Resp 17

## 2020-12-01 VITALS — BP 140/75 | HR 59 | Temp 98.2°F | Resp 20 | Ht 70.0 in | Wt 185.5 lb

## 2020-12-01 DIAGNOSIS — Z20822 Contact with and (suspected) exposure to covid-19: Secondary | ICD-10-CM | POA: Insufficient documentation

## 2020-12-01 DIAGNOSIS — Z992 Dependence on renal dialysis: Secondary | ICD-10-CM

## 2020-12-01 DIAGNOSIS — R21 Rash and other nonspecific skin eruption: Secondary | ICD-10-CM | POA: Diagnosis not present

## 2020-12-01 DIAGNOSIS — Z01812 Encounter for preprocedural laboratory examination: Secondary | ICD-10-CM | POA: Diagnosis present

## 2020-12-01 DIAGNOSIS — N186 End stage renal disease: Secondary | ICD-10-CM

## 2020-12-01 LAB — SARS CORONAVIRUS 2 (TAT 6-24 HRS): SARS Coronavirus 2: NEGATIVE

## 2020-12-01 MED ORDER — TRIAMCINOLONE ACETONIDE 0.025 % EX OINT: 1.0000 "application " | TOPICAL_OINTMENT | Freq: Two times a day (BID) | CUTANEOUS | 0 refills | Status: AC

## 2020-12-01 MED ORDER — VALACYCLOVIR HCL 500 MG PO TABS: 500.0000 mg | ORAL_TABLET | Freq: Every day | ORAL | 0 refills | Status: AC

## 2020-12-01 NOTE — Progress Notes (Signed)
error 

## 2020-12-01 NOTE — ED Triage Notes (Signed)
Pt presents with rash on chest X 2 days.

## 2020-12-01 NOTE — Telephone Encounter (Signed)
Pt called to ask if he could go to HD earlier in the morning before his surgery. Per, PAT nurse who checked with Anesthesia PA, pt can go to HD beforehand, as long as he does not have any b/p issues or fatigue after HD normally. Pt stated his b/p is usually perfect after HanD d that he feels great afterwards. Pt is aware he is to report to [facility] at 1030 day of surgery and has verbalized understanding. No further questions/concerns.

## 2020-12-01 NOTE — ED Provider Notes (Signed)
HPI  SUBJECTIVE:  [name] is a 55 y.o. male who presents with 2-3 days of a pruritic, painful, burning and throbbing erythematous rash starting on his right chest. Questionable blisters from scratching. No fevers, body aches, crusting, preceding paresthesias, new lotions, soaps, detergents, rash elsewhere, change in his medications, known exposure to poison ivy or poison oak. This rash is near his new dialysis site, denies chlorhexidine or other antiseptic use in this area. No contacts with similar rash. He has tried antibiotic ointment, hydrocortisone and Benadryl. The hydrocortisone and Benadryl help. Symptoms worse with scratching. He has a past medical history of end-stage renal disease newly on dialysis Tuesday, Thursday, Saturday. He completed dialysis yesterday. He has a history of diabetes, hypertension, hypercholesterolemia, varicella. No history of MRSA, shingles. PMD: [facility].  Past Medical History:  Diagnosis Date  . Allergy   . Diabetes mellitus    Type 2 DM, Patient reports DM Type 1 diagnosd age at age 6  . Diabetic foot ulcers ([place])   . ESRD (end stage renal disease) ([place])    [facility]  . Foreign body in left foot    with  infection  . Hypertension   . Meniscus tear   . Multiple rib fractures   . Personal history of diabetic foot ulcer, Left Foot     Past Surgical History:  Procedure Laterality Date  . AV FISTULA PLACEMENT Left 11/06/2020   Procedure: LEFT ARM FIRST STAGE BRACHIAL BASILIC ARTERIOVENOUS (AV) FISTULA CREATION;  Surgeon: Marty Heck, MD;  Location: [facility];  Service: Vascular;  Laterality: Left;  . EYE SURGERY Bilateral    lazer  . I & D EXTREMITY  04/23/2012   Procedure: IRRIGATION AND DEBRIDEMENT EXTREMITY;  Surgeon: Newt Minion, MD;  Location: [place];  Service: Orthopedics;  Laterality: Left;  Irrigation and Debridement Left Foot  . I & D EXTREMITY  09/18/2012   Procedure: IRRIGATION AND DEBRIDEMENT EXTREMITY;   Surgeon: Newt Minion, MD;  Location: [place];  Service: Orthopedics;  Laterality: Left;  . IR PERC TUN PERIT CATH WO PORT S&I Dartha Lodge  08/30/2020  . IR US GUIDE VASC ACCESS RIGHT  08/30/2020  . MENISCUS REPAIR Left 04/2016   Orthopedist, Dr [name] in [place], [place]    Family History  Problem Relation Age of Onset  . Diabetes type II Mother   . Arthritis Mother   . Diabetes Mother   . Hyperlipidemia Mother   . Hypertension Mother   . Diabetes type II Sister   . Diabetes Father     Social History   Tobacco Use  . Smoking status: Former Smoker    Types: Cigarettes    Quit date: 09/03/2012    Years since quitting: 8.2  . Smokeless tobacco: Former Systems developer    Quit date: 09/03/2012  Substance Use Topics  . Alcohol use: Not Currently  . Drug use: Not Currently    No current facility-administered medications for this encounter.  Current Outpatient Medications:  .  triamcinolone (KENALOG) 0.025 % ointment, Apply 1 application topically 2 (two) times daily for 14 days., Disp: 30 g, Rfl: 0 .  valACYclovir (VALTREX) 500 MG tablet, Take 1 tablet (500 mg total) by mouth daily for 10 days., Disp: 10 tablet, Rfl: 0 .  acetaminophen (TYLENOL) 325 MG tablet, Take 650 mg by mouth every 6 (six) hours as needed for mild pain or headache. , Disp: , Rfl:  .  amLODipine (NORVASC) 10 MG tablet, Take 1 tablet (  10 mg total) by mouth at bedtime., Disp: 90 tablet, Rfl: 3 .  Blood Glucose Monitoring Suppl (BLOOD GLUCOSE MONITOR SYSTEM) w/Device KIT, 1 kit by Does not apply route daily., Disp: 1 kit, Rfl: 0 .  calcium acetate (PHOSLO) 667 MG capsule, Take 2,001 mg by mouth 3 (three) times daily., Disp: , Rfl:  .  carvedilol (COREG) 12.5 MG tablet, Take 1 tablet (12.5 mg total) by mouth 2 (two) times daily with a meal., Disp: 60 tablet, Rfl: 5 .  furosemide (LASIX) 40 MG tablet, Take 40 mg by mouth every Monday, Wednesday, and Friday., Disp: , Rfl:   Allergies  Allergen Reactions  . Latex Rash and Other  (See Comments)    Pt states he is allergic to latex condoms  . Ace Inhibitors Cough  . Wound Dressing Adhesive Itching and Rash    Allergy to tape adhesive     ROS  As noted in HPI.   Physical Exam  BP (!) 154/66 (BP Location: Right Arm)   Pulse 66   Temp 98.2 F (36.8 C) (Oral)   Resp 17   SpO2 98%   Constitutional: Well developed, well nourished, no acute distress Eyes:  EOMI, conjunctiva normal bilaterally HENT: Normocephalic, atraumatic,mucus membranes moist Respiratory: Normal inspiratory effort Cardiovascular: Normal rate GI: nondistended skin: Flat tender erythematous rash right upper chest with possible vesicles.  No crusting.  Skin intact    Musculoskeletal: no deformities Neurologic: Alert & oriented x 3, no focal neuro deficits Psychiatric: Speech and behavior appropriate   ED Course   Medications - No data to display  No orders of the defined types were placed in this encounter.   Results for orders placed or performed during the hospital encounter of 12/01/20 (from the past 24 hour(s))  SARS CORONAVIRUS 2 (TAT 6-24 HRS) Nasopharyngeal Nasopharyngeal Swab     Status: None   Collection Time: 12/01/20 11:14 AM   Specimen: Nasopharyngeal Swab  Result Value Ref Range   SARS Coronavirus 2 NEGATIVE NEGATIVE   No results found.  ED Clinical Impression  1. Rash      ED Assessment/Plan  Concern for early shingles.  Will send home with triamcinolone ointment, he is to start some Claritin or Zyrtec, and will start some Valtrex, renally dosed.  Also home with Bactroban.  He is to call his doctor for a video visit on Monday for reevaluation.  He states that he cannot go in for an in person visit as he is quarantining tomorrow because he has surgery on Tuesday.  Will send home with triamcinolone ointment, Valtrex 500 mg every 24 hours, give after dialysis on dialysis day, is to start Claritin 10 mg every 48 hours.  Bactroban.  Discussed  MDM, treatment  plan, and plan for follow-up with patient. Discussed sn/sx that should prompt return to the ED. patient agrees with plan.   Meds ordered this encounter  Medications  . valACYclovir (VALTREX) 500 MG tablet    Sig: Take 1 tablet (500 mg total) by mouth daily for 10 days.    Dispense:  10 tablet    Refill:  0  . triamcinolone (KENALOG) 0.025 % ointment    Sig: Apply 1 application topically 2 (two) times daily for 14 days.    Dispense:  30 g    Refill:  0    *This clinic note was created using [employer]. Therefore, there may be occasional mistakes despite careful proofreading.   ?    Melynda Ripple, MD 12/02/20  0651  

## 2020-12-01 NOTE — Discharge Instructions (Addendum)
I am not sure as to the cause of your rash but I am concerned that it is early shingles.  Triamcinolone ointment as written, Claritin 10 mg every 48 hours for itching.  Valtrex 500 mg every 24 hours.  Give after dialysis on dialysis days.  This will help with possible shingles.  Bactroban if it starts to look infected.  Follow-up with your doctor with a video visit on Monday.

## 2020-12-01 NOTE — Progress Notes (Signed)
POST OPERATIVE OFFICE NOTE    CC:  F/u for surgery  HPI:  This is a 55 y.o. male who is s/p  first stage basilic fistula on 3/95/32 by Dr. Carlis Abbott.  He called with a CC of decreased sensation in the thumb and pointer finger and coolness to touch.  He feels like his hand is getting weaker and weaker.  This is his first access surgery.  He is on HD via [facility].  He denise fever and chills.  Pt returns today for follow up.    Allergies  Allergen Reactions  . Latex Rash and Other (See Comments)    Pt states he is allergic to latex condoms  . Ace Inhibitors Cough  . Wound Dressing Adhesive Itching and Rash    Allergy to tape adhesive    Current Outpatient Medications  Medication Sig Dispense Refill  . acetaminophen (TYLENOL) 325 MG tablet Take 650 mg by mouth every 6 (six) hours as needed for mild pain or headache.     [name] amLODipine (NORVASC) 10 MG tablet Take 1 tablet (10 mg total) by mouth at bedtime. 90 tablet 3  . Blood Glucose Monitoring Suppl (BLOOD GLUCOSE MONITOR SYSTEM) w/Device KIT 1 kit by Does not apply route daily. 1 kit 0  . calcium acetate (PHOSLO) 667 MG capsule Take 2,001 mg by mouth 3 (three) times daily.    . calcium elemental as carbonate (TUMS ULTRA 1000) 400 MG chewable tablet Chew 1,000 mg by mouth 3 (three) times daily.    . carvedilol (COREG) 12.5 MG tablet Take 1 tablet (12.5 mg total) by mouth 2 (two) times daily with a meal. 60 tablet 5  . hydrALAZINE (APRESOLINE) 50 MG tablet Take 50 mg by mouth 3 (three) times daily.     No current facility-administered medications for this visit.     ROS:  See HPI  Physical Exam:    Incision:  Incision has healed well Extremities:  Loss of interosseous muscles in the dorsum of the hand, weak grip left compared to right.  Left hand cool to touch without palpable left radial pulse.  General motor is intact.     +-----------------+-------------+----------+---------+  Right Cephalic  Diameter (cm)Depth (cm)Findings    +-----------------+-------------+----------+---------+  Shoulder       0.54               +-----------------+-------------+----------+---------+  Mid upper arm    0.46               +-----------------+-------------+----------+---------+  Dist upper arm    0.45        branching  +-----------------+-------------+----------+---------+  Antecubital fossa  0.68               +-----------------+-------------+----------+---------+  Prox forearm     0.31               +-----------------+-------------+----------+---------+  Mid forearm     0.32               +-----------------+-------------+----------+---------+  Wrist        0.30               +-----------------+-------------+----------+---------+   +-----------------+-------------+----------+---------+  Left Cephalic  Diameter (cm)Depth (cm)Findings   +-----------------+-------------+----------+---------+  Mid upper arm    0.21               +-----------------+-------------+----------+---------+  Dist upper arm    0.27               +-----------------+-------------+----------+---------+  Antecubital fossa  0.47               +-----------------+-------------+----------+---------+  Prox forearm     0.18        branching  +-----------------+-------------+----------+---------+  Mid forearm     0.17        branching  +-----------------+-------------+----------+---------+  Wrist        0.14               +-----------------+-------------+----------+---------+   +-----------------+-------------+----------+---------+  Left Basilic   Diameter (cm)Depth (cm)Findings   +-----------------+-------------+----------+---------+  Prox upper arm    0.49                +-----------------+-------------+----------+---------+  Mid upper arm    0.28        branching  +-----------------+-------------+----------+---------+  Dist upper arm    0.40               +-----------------+-------------+----------+---------+  Antecubital fossa  0.38               +-----------------+-------------+----------+---------+  Prox forearm     0.29        branching  +-----------------+-------------+----------+---------+    Assessment/Plan:  This is a 55 y.o. male who is s/p: Left basilic first stage with sever steal symptoms to include decreed sensation and muscle atrophy.  No evidence of skin loss. Gross motor intact.  On vein mapping he has a good cephalic vein on the right UE.  I will schedule him for radial/cephalic verse brachial cephalic av fistula creation and ligation of left basilic fistula to restore blood flow to the left hand.  He has HD on TTS and has a working [employer]     Roxy Horseman PA-C [facility] [phone]   Clinic MD:  Donzetta Matters

## 2020-12-01 NOTE — H&P (View-Only) (Signed)
POST OPERATIVE OFFICE NOTE    CC:  F/u for surgery  HPI:  This is a 55 y.o. male who is s/p  first stage basilic fistula on 05/07/64 by Dr. Carlis Abbott.  He called with a CC of decreased sensation in the thumb and pointer finger and coolness to touch.  He feels like his hand is getting weaker and weaker.  This is his first access surgery.  He is on HD via [facility].  He denise fever and chills.  Pt returns today for follow up.    Allergies  Allergen Reactions  . Latex Rash and Other (See Comments)    Pt states he is allergic to latex condoms  . Ace Inhibitors Cough  . Wound Dressing Adhesive Itching and Rash    Allergy to tape adhesive    Current Outpatient Medications  Medication Sig Dispense Refill  . acetaminophen (TYLENOL) 325 MG tablet Take 650 mg by mouth every 6 (six) hours as needed for mild pain or headache.     [name] amLODipine (NORVASC) 10 MG tablet Take 1 tablet (10 mg total) by mouth at bedtime. 90 tablet 3  . Blood Glucose Monitoring Suppl (BLOOD GLUCOSE MONITOR SYSTEM) w/Device KIT 1 kit by Does not apply route daily. 1 kit 0  . calcium acetate (PHOSLO) 667 MG capsule Take 2,001 mg by mouth 3 (three) times daily.    . calcium elemental as carbonate (TUMS ULTRA 1000) 400 MG chewable tablet Chew 1,000 mg by mouth 3 (three) times daily.    . carvedilol (COREG) 12.5 MG tablet Take 1 tablet (12.5 mg total) by mouth 2 (two) times daily with a meal. 60 tablet 5  . hydrALAZINE (APRESOLINE) 50 MG tablet Take 50 mg by mouth 3 (three) times daily.     No current facility-administered medications for this visit.     ROS:  See HPI  Physical Exam:    Incision:  Incision has healed well Extremities:  Loss of interosseous muscles in the dorsum of the hand, weak grip left compared to right.  Left hand cool to touch without palpable left radial pulse.  General motor is intact.     +-----------------+-------------+----------+---------+  Right Cephalic  Diameter (cm)Depth (cm)Findings    +-----------------+-------------+----------+---------+  Shoulder       0.54               +-----------------+-------------+----------+---------+  Mid upper arm    0.46               +-----------------+-------------+----------+---------+  Dist upper arm    0.45        branching  +-----------------+-------------+----------+---------+  Antecubital fossa  0.68               +-----------------+-------------+----------+---------+  Prox forearm     0.31               +-----------------+-------------+----------+---------+  Mid forearm     0.32               +-----------------+-------------+----------+---------+  Wrist        0.30               +-----------------+-------------+----------+---------+   +-----------------+-------------+----------+---------+  Left Cephalic  Diameter (cm)Depth (cm)Findings   +-----------------+-------------+----------+---------+  Mid upper arm    0.21               +-----------------+-------------+----------+---------+  Dist upper arm    0.27               +-----------------+-------------+----------+---------+  Antecubital fossa  0.47               +-----------------+-------------+----------+---------+  Prox forearm     0.18        branching  +-----------------+-------------+----------+---------+  Mid forearm     0.17        branching  +-----------------+-------------+----------+---------+  Wrist        0.14               +-----------------+-------------+----------+---------+   +-----------------+-------------+----------+---------+  Left Basilic   Diameter (cm)Depth (cm)Findings   +-----------------+-------------+----------+---------+  Prox upper arm    0.49                +-----------------+-------------+----------+---------+  Mid upper arm    0.28        branching  +-----------------+-------------+----------+---------+  Dist upper arm    0.40               +-----------------+-------------+----------+---------+  Antecubital fossa  0.38               +-----------------+-------------+----------+---------+  Prox forearm     0.29        branching  +-----------------+-------------+----------+---------+    Assessment/Plan:  This is a 55 y.o. male who is s/p: Left basilic first stage with sever steal symptoms to include decreed sensation and muscle atrophy.  No evidence of skin loss. Gross motor intact.  On vein mapping he has a good cephalic vein on the right UE.  I will schedule him for radial/cephalic verse brachial cephalic av fistula creation and ligation of left basilic fistula to restore blood flow to the left hand.  He has HD on TTS and has a working [facility].     Roxy Horseman PA-C [facility] [phone]   Clinic MD:  Donzetta Matters

## 2020-12-04 ENCOUNTER — Ambulatory Visit: Payer: 59

## 2020-12-04 ENCOUNTER — Encounter (HOSPITAL_COMMUNITY): Payer: Self-pay | Admitting: Vascular Surgery

## 2020-12-05 ENCOUNTER — Ambulatory Visit (HOSPITAL_COMMUNITY): Payer: 59 | Admitting: Certified Registered"

## 2020-12-05 ENCOUNTER — Other Ambulatory Visit: Payer: Self-pay

## 2020-12-05 ENCOUNTER — Encounter (HOSPITAL_COMMUNITY)
Admission: RE | Disposition: A | Discharge status: Home / Self Care | Payer: Self-pay | Source: Home / Self Care | Attending: Vascular Surgery

## 2020-12-05 ENCOUNTER — Encounter (HOSPITAL_COMMUNITY): Payer: Self-pay | Admitting: Vascular Surgery

## 2020-12-05 ENCOUNTER — Ambulatory Visit (HOSPITAL_COMMUNITY)
Admission: RE | Admit: 2020-12-05 | Discharge: 2020-12-05 | Disposition: A | Discharge status: Home / Self Care | Payer: 59 | Attending: Vascular Surgery | Admitting: Vascular Surgery

## 2020-12-05 DIAGNOSIS — Z79899 Other long term (current) drug therapy: Secondary | ICD-10-CM | POA: Diagnosis not present

## 2020-12-05 DIAGNOSIS — Y841 Kidney dialysis as the cause of abnormal reaction of the patient, or of later complication, without mention of misadventure at the time of the procedure: Secondary | ICD-10-CM | POA: Diagnosis not present

## 2020-12-05 DIAGNOSIS — Z91040 Latex allergy status: Secondary | ICD-10-CM | POA: Diagnosis not present

## 2020-12-05 DIAGNOSIS — T82898A Other specified complication of vascular prosthetic devices, implants and grafts, initial encounter: Secondary | ICD-10-CM | POA: Insufficient documentation

## 2020-12-05 DIAGNOSIS — N186 End stage renal disease: Secondary | ICD-10-CM | POA: Insufficient documentation

## 2020-12-05 DIAGNOSIS — Z992 Dependence on renal dialysis: Secondary | ICD-10-CM | POA: Insufficient documentation

## 2020-12-05 DIAGNOSIS — Z888 Allergy status to other drugs, medicaments and biological substances status: Secondary | ICD-10-CM | POA: Insufficient documentation

## 2020-12-05 HISTORY — PX: AV FISTULA PLACEMENT: SHX1204

## 2020-12-05 HISTORY — PX: LIGATION OF ARTERIOVENOUS  FISTULA: SHX5948

## 2020-12-05 LAB — POCT I-STAT, CHEM 8
BUN: 27 mg/dL — ABNORMAL HIGH (ref 6–20)
Calcium, Ion: 1.02 mmol/L — ABNORMAL LOW (ref 1.15–1.40)
Chloride: 101 mmol/L (ref 98–111)
Creatinine, Ser: 3.8 mg/dL — ABNORMAL HIGH (ref 0.61–1.24)
Glucose, Bld: 72 mg/dL (ref 70–99)
HCT: 40 % (ref 39.0–52.0)
Hemoglobin: 13.6 g/dL (ref 13.0–17.0)
Potassium: 3.2 mmol/L — ABNORMAL LOW (ref 3.5–5.1)
Sodium: 139 mmol/L (ref 135–145)
TCO2: 24 mmol/L (ref 22–32)

## 2020-12-05 LAB — GLUCOSE, CAPILLARY: Glucose-Capillary: 83 mg/dL (ref 70–99)

## 2020-12-05 SURGERY — ARTERIOVENOUS (AV) FISTULA CREATION
Anesthesia: General | Site: Arm Upper | Laterality: Right

## 2020-12-05 MED ORDER — GLYCOPYRROLATE 0.2 MG/ML IJ SOLN
INTRAMUSCULAR | Status: DC | PRN
  Administered 2020-12-05: .2 mg via INTRAVENOUS

## 2020-12-05 MED ORDER — HEPARIN SODIUM (PORCINE) 1000 UNIT/ML IJ SOLN
INTRAMUSCULAR | Status: AC
  Filled 2020-12-05: qty 1

## 2020-12-05 MED ORDER — PAPAVERINE HCL 30 MG/ML IJ SOLN
INTRAMUSCULAR | Status: AC
  Filled 2020-12-05: qty 2

## 2020-12-05 MED ORDER — SODIUM CHLORIDE 0.9 % IV SOLN
INTRAVENOUS | Status: DC | PRN
  Administered 2020-12-05: 500 mL

## 2020-12-05 MED ORDER — CHLORHEXIDINE GLUCONATE 4 % EX LIQD: 60.0000 mL | Freq: Once | CUTANEOUS | Status: DC

## 2020-12-05 MED ORDER — FENTANYL CITRATE (PF) 100 MCG/2ML IJ SOLN
25.0000 ug | INTRAMUSCULAR | Status: DC | PRN
Start: 2020-12-05 — End: 2020-12-06

## 2020-12-05 MED ORDER — DEXAMETHASONE SODIUM PHOSPHATE 10 MG/ML IJ SOLN
INTRAMUSCULAR | Status: AC
  Filled 2020-12-05: qty 1

## 2020-12-05 MED ORDER — HYDRALAZINE HCL 20 MG/ML IJ SOLN
INTRAMUSCULAR | Status: AC
  Filled 2020-12-05: qty 1

## 2020-12-05 MED ORDER — ONDANSETRON HCL 4 MG/2ML IJ SOLN
INTRAMUSCULAR | Status: AC
  Filled 2020-12-05: qty 2

## 2020-12-05 MED ORDER — PAPAVERINE HCL 30 MG/ML IJ SOLN
INTRAMUSCULAR | Status: DC | PRN
  Administered 2020-12-05: 60 mg via INTRAVENOUS

## 2020-12-05 MED ORDER — OXYCODONE HCL 5 MG/5ML PO SOLN: 5.0000 mg | Freq: Once | ORAL | Status: DC | PRN

## 2020-12-05 MED ORDER — SODIUM CHLORIDE 0.9 % IV SOLN
INTRAVENOUS | Status: AC
  Filled 2020-12-05: qty 1.2

## 2020-12-05 MED ORDER — PROPOFOL 10 MG/ML IV BOLUS
INTRAVENOUS | Status: AC
  Filled 2020-12-05: qty 20

## 2020-12-05 MED ORDER — HEPARIN SODIUM (PORCINE) 1000 UNIT/ML IJ SOLN
INTRAMUSCULAR | Status: DC | PRN
  Administered 2020-12-05: 8000 [IU] via INTRAVENOUS

## 2020-12-05 MED ORDER — SODIUM CHLORIDE 0.9 % IV SOLN: INTRAVENOUS | Status: DC

## 2020-12-05 MED ORDER — HYDRALAZINE HCL 20 MG/ML IJ SOLN
INTRAMUSCULAR | Status: DC | PRN
  Administered 2020-12-05: 5 mg via INTRAVENOUS

## 2020-12-05 MED ORDER — ONDANSETRON HCL 4 MG/2ML IJ SOLN
INTRAMUSCULAR | Status: DC | PRN
  Administered 2020-12-05: 4 mg via INTRAVENOUS

## 2020-12-05 MED ORDER — MIDAZOLAM HCL 2 MG/2ML IJ SOLN
INTRAMUSCULAR | Status: AC
  Filled 2020-12-05: qty 2

## 2020-12-05 MED ORDER — HYDROCODONE-ACETAMINOPHEN 5-325 MG PO TABS: 1.0000 | ORAL_TABLET | Freq: Four times a day (QID) | ORAL | 0 refills | Status: DC | PRN

## 2020-12-05 MED ORDER — PROPOFOL 10 MG/ML IV BOLUS
INTRAVENOUS | Status: DC | PRN
  Administered 2020-12-05: 150 mg via INTRAVENOUS

## 2020-12-05 MED ORDER — ORAL CARE MOUTH RINSE: 15.0000 mL | Freq: Once | OROMUCOSAL | Status: AC

## 2020-12-05 MED ORDER — SODIUM CHLORIDE 0.9 % IV SOLN: INTRAVENOUS | Status: DC | PRN

## 2020-12-05 MED ORDER — LIDOCAINE-EPINEPHRINE (PF) 1 %-1:200000 IJ SOLN
INTRAMUSCULAR | Status: DC | PRN
  Administered 2020-12-05: 6 mL

## 2020-12-05 MED ORDER — MIDAZOLAM HCL 2 MG/2ML IJ SOLN
INTRAMUSCULAR | Status: DC | PRN
  Administered 2020-12-05: 2 mg via INTRAVENOUS

## 2020-12-05 MED ORDER — 0.9 % SODIUM CHLORIDE (POUR BTL) OPTIME
TOPICAL | Status: DC | PRN
  Administered 2020-12-05: 1000 mL

## 2020-12-05 MED ORDER — PROTAMINE SULFATE 10 MG/ML IV SOLN
INTRAVENOUS | Status: AC
  Filled 2020-12-05: qty 5

## 2020-12-05 MED ORDER — SUFENTANIL CITRATE 50 MCG/ML IV SOLN
INTRAVENOUS | Status: AC
  Filled 2020-12-05: qty 1

## 2020-12-05 MED ORDER — DEXAMETHASONE SODIUM PHOSPHATE 10 MG/ML IJ SOLN
INTRAMUSCULAR | Status: DC | PRN
  Administered 2020-12-05: 5 mg via INTRAVENOUS

## 2020-12-05 MED ORDER — HEPARIN SODIUM (PORCINE) 1000 UNIT/ML IJ SOLN
1600.0000 [IU] | Freq: Once | INTRAMUSCULAR | Status: AC
  Administered 2020-12-05: 1600 [IU] via INTRAVENOUS
  Filled 2020-12-05: qty 1.6

## 2020-12-05 MED ORDER — LIDOCAINE HCL (PF) 1 % IJ SOLN
INTRAMUSCULAR | Status: AC
  Filled 2020-12-05: qty 30

## 2020-12-05 MED ORDER — ONDANSETRON HCL 4 MG/2ML IJ SOLN: 4.0000 mg | Freq: Four times a day (QID) | INTRAMUSCULAR | Status: DC | PRN

## 2020-12-05 MED ORDER — CHLORHEXIDINE GLUCONATE 0.12 % MT SOLN
15.0000 mL | Freq: Once | OROMUCOSAL | Status: AC
  Administered 2020-12-05: 15 mL via OROMUCOSAL
  Filled 2020-12-05: qty 15

## 2020-12-05 MED ORDER — SUFENTANIL CITRATE 50 MCG/ML IV SOLN
INTRAVENOUS | Status: DC | PRN
  Administered 2020-12-05: 10 ug via INTRAVENOUS

## 2020-12-05 MED ORDER — CEFAZOLIN SODIUM-DEXTROSE 2-4 GM/100ML-% IV SOLN
2.0000 g | INTRAVENOUS | Status: AC
  Administered 2020-12-05: 2 g via INTRAVENOUS
  Filled 2020-12-05: qty 100

## 2020-12-05 MED ORDER — OXYCODONE HCL 5 MG PO TABS: 5.0000 mg | ORAL_TABLET | Freq: Once | ORAL | Status: DC | PRN

## 2020-12-05 MED ORDER — LIDOCAINE-EPINEPHRINE (PF) 1 %-1:200000 IJ SOLN
INTRAMUSCULAR | Status: AC
  Filled 2020-12-05: qty 30

## 2020-12-05 MED ORDER — PROTAMINE SULFATE 10 MG/ML IV SOLN
INTRAVENOUS | Status: DC | PRN
  Administered 2020-12-05: 50 mg via INTRAVENOUS

## 2020-12-05 SURGICAL SUPPLY — 35 items
ARMBAND PINK RESTRICT EXTREMIT (MISCELLANEOUS) ×6 IMPLANT
CANISTER SUCT 3000ML PPV (MISCELLANEOUS) ×3 IMPLANT
CANNULA VESSEL 3MM 2 BLNT TIP (CANNULA) ×6 IMPLANT
CLIP VESOCCLUDE MED 6/CT (CLIP) ×3 IMPLANT
CLIP VESOCCLUDE SM WIDE 6/CT (CLIP) ×9 IMPLANT
COVER PROBE W GEL 5X96 (DRAPES) ×3 IMPLANT
COVER WAND RF STERILE (DRAPES) IMPLANT
DERMABOND ADVANCED (GAUZE/BANDAGES/DRESSINGS) ×2
DERMABOND ADVANCED .7 DNX12 (GAUZE/BANDAGES/DRESSINGS) ×4 IMPLANT
ELECT REM PT RETURN 9FT ADLT (ELECTROSURGICAL) ×3
ELECTRODE REM PT RTRN 9FT ADLT (ELECTROSURGICAL) ×2 IMPLANT
GLOVE BIO SURGEON STRL SZ7.5 (GLOVE) ×3 IMPLANT
GOWN STRL REUS W/ TWL LRG LVL3 (GOWN DISPOSABLE) ×4 IMPLANT
GOWN STRL REUS W/ TWL XL LVL3 (GOWN DISPOSABLE) ×2 IMPLANT
GOWN STRL REUS W/TWL LRG LVL3 (GOWN DISPOSABLE) ×6
GOWN STRL REUS W/TWL XL LVL3 (GOWN DISPOSABLE) ×3
INSERT FOGARTY SM (MISCELLANEOUS) IMPLANT
KIT BASIN OR (CUSTOM PROCEDURE TRAY) ×3 IMPLANT
KIT TURNOVER KIT B (KITS) ×3 IMPLANT
NEEDLE HYPO 25GX1X1/2 BEV (NEEDLE) ×3 IMPLANT
NS IRRIG 1000ML POUR BTL (IV SOLUTION) ×3 IMPLANT
PACK CV ACCESS (CUSTOM PROCEDURE TRAY) ×3 IMPLANT
PAD ARMBOARD 7.5X6 YLW CONV (MISCELLANEOUS) ×6 IMPLANT
SUT ETHILON 3 0 PS 1 (SUTURE) IMPLANT
SUT MNCRL AB 4-0 PS2 18 (SUTURE) ×6 IMPLANT
SUT PROLENE 6 0 BV (SUTURE) ×6 IMPLANT
SUT SILK 0 TIES 10X30 (SUTURE) ×3 IMPLANT
SUT VIC AB 3-0 SH 27 (SUTURE) ×6
SUT VIC AB 3-0 SH 27X BRD (SUTURE) ×4 IMPLANT
SYR 20ML LL LF (SYRINGE) ×3 IMPLANT
SYR 3ML LL SCALE MARK (SYRINGE) ×3 IMPLANT
SYR CONTROL 10ML LL (SYRINGE) ×3 IMPLANT
TOWEL GREEN STERILE (TOWEL DISPOSABLE) ×3 IMPLANT
UNDERPAD 30X36 HEAVY ABSORB (UNDERPADS AND DIAPERS) ×3 IMPLANT
WATER STERILE IRR 1000ML POUR (IV SOLUTION) ×3 IMPLANT

## 2020-12-05 NOTE — Op Note (Signed)
    NAME: [name]    MRN: [id number] DOB: [birth year]    DATE OF OPERATION: 12/05/2020  PREOP DIAGNOSIS:    Steal syndrome left upper extremity, end-stage renal disease  POSTOP DIAGNOSIS:    Same  PROCEDURE:    1.  Ligation of left basilic vein fistula 2.  New right radiocephalic AV fistula  SURGEON: Judeth Cornfield. Scot Dock, MD  ASSIST: Arlee Muslim, PA  ANESTHESIA: General  EBL: Minimal  INDICATIONS:    [name] is a 55 y.o. male who developed steal after a first stage left basilic vein transposition.  He was set up for ligation of the fistula and placement of new access  FINDINGS:   On physical exam the forearm cephalic vein looked very reasonable in size.  However, it did spasm significantly with dissection.  However it was about a 3 mm vein.  The radial artery was calcified.  It was a good thrill in the fistula at the completion of the procedure with good Doppler flow in the radial, ulnar, and palmar arch positions.  TECHNIQUE:   The patient was taken to the operating room and received a general anesthetic.  Both arms were prepped and draped in usual sterile fashion.  A small transverse incision was made over the fistula just above the antecubital level in the left arm.  The fistula was dissected free and ligated with a 2-0 silk tie.  This wound was closed with a 4-0 Vicryl.  Dermabond was applied.  Attention was then turned to the right arm.  On exam the forearm cephalic vein appeared reasonable in size.  Incision was made at the right wrist longitudinally.  Through this incision the cephalic vein was dissected free.  Spasm significantly.  Beneath the fascia the radial artery was dissected free.  It was significantly calcified.  Patient was heparinized.  The vein was ligated distally and spatulated.  The calcified radial artery was clamped proximally and distally and a longitudinal arteriotomy was made.  The vein was sewn into side of the artery using  continuous 6-0 Prolene suture.  At the completion was a good thrill in the fistula.  There was a good radial ulnar and palmar arch signal with a Doppler.  Hemostasis was obtained in the wound.  The wound was closed with a deep layer of 3-0 Vicryl and the skin closed with 4-0 Vicryl.  There was one large competing branch in the mid upper arm and this was identified.  A small transverse incision was made over this vein which was ligated with two 3-0 silk ties.  This wound was closed with a 4-0 Vicryl.  Given the complexity of the case a first assistant was necessary in order to expedient the procedure and safely perform the technical aspects of the operation.  Deitra Mayo, MD, FACS [facility] [place] DICTATION:   12/05/2020

## 2020-12-05 NOTE — Discharge Instructions (Signed)
° °  [facility] [place] ° °Discharge Instructions ° °AV Fistula or Graft Surgery for Dialysis Access ° °Please refer to the following instructions for your post-procedure care. Your surgeon or physician assistant will discuss any changes with you. ° °Activity ° °You may drive the day following your surgery, if you are comfortable and no longer taking prescription pain medication. Resume full activity as the soreness in your incision resolves. ° °Bathing/Showering ° °You may shower after you go home. Keep your incision dry for 48 hours. Do not soak in a bathtub, hot tub, or swim until the incision heals completely. You may not shower if you have a hemodialysis catheter. ° °Incision Care ° °Clean your incision with mild soap and water after 48 hours. Pat the area dry with a clean towel. You do not need a bandage unless otherwise instructed. Do not apply any ointments or creams to your incision. You may have skin glue on your incision. Do not peel it off. It will come off on its own in about one week. Your arm may swell a bit after surgery. To reduce swelling use pillows to elevate your arm so it is above your heart. Your doctor will tell you if you need to lightly wrap your arm with an ACE bandage. ° °Diet ° °Resume your normal diet. There are not special food restrictions following this procedure. In order to heal from your surgery, it is CRITICAL to get adequate nutrition. Your body requires vitamins, minerals, and protein. Vegetables are the best source of vitamins and minerals. Vegetables also provide the perfect balance of protein. Processed food has little nutritional value, so try to avoid this. ° °Medications ° °Resume taking all of your medications. If your incision is causing pain, you may take over-the counter pain relievers such as acetaminophen (Tylenol). If you were prescribed a stronger pain medication, please be aware these medications can cause nausea and constipation. Prevent  nausea by taking the medication with a snack or meal. Avoid constipation by drinking plenty of fluids and eating foods with high amount of fiber, such as fruits, vegetables, and grains. Do not take Tylenol if you are taking prescription pain medications. ° ° ° ° °Follow up °Your surgeon may want to see you in the office following your access surgery. If so, this will be arranged at the time of your surgery. ° °Please call us immediately for any of the following conditions: ° °Increased pain, redness, drainage (pus) from your incision site °Fever of 101 degrees or higher °Severe or worsening pain at your incision site °Hand pain or numbness. ° °Reduce your risk of vascular disease: ° °Stop smoking. If you would like help, call [facility] at 1-800-QUIT-NOW (1-800-784-8669) or [facility] at 336-586-4000 ° °Manage your cholesterol °Maintain a desired weight °Control your diabetes °Keep your blood pressure down ° °Dialysis ° °It will take several weeks to several months for your new dialysis access to be ready for use. Your surgeon will determine when it is OK to use it. Your nephrologist will continue to direct your dialysis. You can continue to use your Permcath until your new access is ready for use. ° °If you have any questions, please call the office at [phone]. ° °

## 2020-12-05 NOTE — Anesthesia Procedure Notes (Signed)
Procedure Name: LMA Insertion Date/Time: 12/05/2020 12:19 PM Performed by: Claris Che, CRNA Pre-anesthesia Checklist: Patient identified, Emergency Drugs available, Suction available and Patient being monitored Patient Re-evaluated:Patient Re-evaluated prior to induction Oxygen Delivery Method: Circle System Utilized Preoxygenation: Pre-oxygenation with 100% oxygen Induction Type: IV induction Ventilation: Mask ventilation without difficulty LMA: LMA inserted LMA Size: 4.0 Number of attempts: 1 Placement Confirmation: positive ETCO2 Tube secured with: Tape Dental Injury: Teeth and Oropharynx as per pre-operative assessment

## 2020-12-05 NOTE — Transfer of Care (Signed)
Immediate Anesthesia Transfer of Care Note  Patient: [name]  Procedure(s) Performed: RADIOCEPHALIC ARTERIOVENOUS (AV) FISTULA CREATION RIGHT ARM (Right Arm Lower) LIGATION OF ARTERIOVENOUS  FISTULA LEFT ARM (Left Arm Upper)  Patient Location: PACU  Anesthesia Type:General  Level of Consciousness: oriented, drowsy, patient cooperative and responds to stimulation  Airway & Oxygen Therapy: Patient Spontanous Breathing and Patient connected to nasal cannula oxygen  Post-op Assessment: Report given to RN, Post -op Vital signs reviewed and stable and Patient moving all extremities X 4  Post vital signs: Reviewed and stable  Last Vitals:  Vitals Value Taken Time  BP 166/67 12/05/20 1412  Temp    Pulse 59 12/05/20 1418  Resp 14 12/05/20 1418  SpO2 99 % 12/05/20 1418  Vitals shown include unvalidated device data.  Last Pain:  Vitals:   12/05/20 1049  TempSrc:   PainSc: 0-No pain         Complications: No complications documented.

## 2020-12-05 NOTE — Anesthesia Preprocedure Evaluation (Signed)
Anesthesia Evaluation  Patient identified by MRN, date of birth, ID band Patient awake    Reviewed: Allergy & Precautions, H&P , NPO status , Patient's Chart, lab work & pertinent test results  Airway Mallampati: II   Neck ROM: full    Dental   Pulmonary former smoker,    breath sounds clear to auscultation       Cardiovascular hypertension,  Rhythm:regular Rate:Normal     Neuro/Psych PSYCHIATRIC DISORDERS Anxiety    GI/Hepatic   Endo/Other  diabetes, Type 2  Renal/GU ESRF and DialysisRenal disease     Musculoskeletal   Abdominal   Peds  Hematology   Anesthesia Other Findings   Reproductive/Obstetrics                             Anesthesia Physical Anesthesia Plan  ASA: III  Anesthesia Plan: General   Post-op Pain Management:    Induction: Intravenous  PONV Risk Score and Plan: 2 and Ondansetron, Midazolam and Treatment may vary due to age or medical condition  Airway Management Planned: LMA  Additional Equipment:   Intra-op Plan:   Post-operative Plan: Extubation in OR  Informed Consent: I have reviewed the patients History and Physical, chart, labs and discussed the procedure including the risks, benefits and alternatives for the proposed anesthesia with the patient or authorized representative who has indicated his/her understanding and acceptance.     Dental advisory given  Plan Discussed with: CRNA, Anesthesiologist and Surgeon  Anesthesia Plan Comments:         Anesthesia Quick Evaluation

## 2020-12-05 NOTE — Interval H&P Note (Signed)
History and Physical Interval Note:  12/05/2020 12:00 PM  [place]  has presented today for surgery, with the diagnosis of ESRD.  The various methods of treatment have been discussed with the patient and family. After consideration of risks, benefits and other options for treatment, the patient has consented to  Procedure(s): ARTERIOVENOUS (AV) FISTULA CREATION RIGHT ARM (Right) LIGATION OF ARTERIOVENOUS  FISTULA LEFT ARM (Left) as a surgical intervention.  The patient's history has been reviewed, patient examined, no change in status, stable for surgery.  I have reviewed the patient's chart and labs.  Questions were answered to the patient's satisfaction.     Deitra Mayo

## 2020-12-06 ENCOUNTER — Encounter (HOSPITAL_COMMUNITY): Payer: Self-pay | Admitting: Vascular Surgery

## 2020-12-06 NOTE — Anesthesia Postprocedure Evaluation (Signed)
Anesthesia Post Note  Patient: Josniel Ekberg  Procedure(s) Performed: RADIOCEPHALIC ARTERIOVENOUS (AV) FISTULA CREATION RIGHT ARM (Right Arm Lower) LIGATION OF ARTERIOVENOUS  FISTULA LEFT ARM (Left Arm Upper)     Patient location during evaluation: PACU Anesthesia Type: General Level of consciousness: awake and alert Pain management: pain level controlled Vital Signs Assessment: post-procedure vital signs reviewed and stable Respiratory status: spontaneous breathing, nonlabored ventilation, respiratory function stable and patient connected to nasal cannula oxygen Cardiovascular status: blood pressure returned to baseline and stable Postop Assessment: no apparent nausea or vomiting Anesthetic complications: no   No complications documented.  Last Vitals:  Vitals:   12/05/20 1505 12/05/20 1520  BP: (!) 174/69 (!) 173/93  Pulse: 62 61  Resp: 13 11  Temp:    SpO2: 99% 98%    Last Pain:  Vitals:   12/05/20 1520  TempSrc:   PainSc: 0-No pain                 Kasiyah Platter S

## 2020-12-12 ENCOUNTER — Encounter (HOSPITAL_COMMUNITY): Payer: 59

## 2020-12-14 ENCOUNTER — Telehealth: Payer: Self-pay

## 2020-12-14 NOTE — Telephone Encounter (Signed)
Patient called to report blue streaks on pointer finger and thumb fingernail on the left hand that developed last night. He is s/p first stage BVT placement on 11/06/20 that developed steal syndrome and was ligated on 3/1. A right AVF was placed at the same time. R arm is doing fine. He is having some weakness in his left thumb and pain in that arm. It has not gotten worse, but has not gotten better. Discussed with Dr. Scot Dock and placed on PA schedule for tomorrow. Advised patient that with steal syndrome, symptoms can take a bit to resolve - but since he has developed some discoloration of his nails, we will bring him in for evaluation.

## 2020-12-15 ENCOUNTER — Encounter: Payer: Self-pay | Admitting: Physician Assistant

## 2020-12-15 ENCOUNTER — Ambulatory Visit (INDEPENDENT_AMBULATORY_CARE_PROVIDER_SITE_OTHER): Payer: 59 | Admitting: Physician Assistant

## 2020-12-15 ENCOUNTER — Other Ambulatory Visit: Payer: Self-pay

## 2020-12-15 VITALS — BP 123/69 | HR 64 | Temp 97.9°F | Resp 20 | Ht 70.0 in | Wt 184.9 lb

## 2020-12-15 DIAGNOSIS — Z992 Dependence on renal dialysis: Secondary | ICD-10-CM

## 2020-12-15 DIAGNOSIS — N186 End stage renal disease: Secondary | ICD-10-CM

## 2020-12-15 NOTE — Progress Notes (Signed)
  POST OPERATIVE OFFICE NOTE    CC:  F/u for surgery  HPI:  This is a 55 y.o. male who is s/p1.  Ligation of left basilic vein fistula 2.  New right radiocephalic AV fistula  on 12/13/66 by Dr. Scot Dock.    Pt returns today for follow up.  Pt states he has shooting pain down the left forearm into the radial nerve distribution and new dark lines on his finger nails on the left hand.  He has better motion and the left hand is not cold anymore.  Allergies  Allergen Reactions  . Latex Rash and Other (See Comments)    Pt states he is allergic to latex condoms  . Ace Inhibitors Cough  . Wound Dressing Adhesive Itching and Rash    Allergy to tape adhesive    Current Outpatient Medications  Medication Sig Dispense Refill  . acetaminophen (TYLENOL) 325 MG tablet Take 650 mg by mouth every 6 (six) hours as needed for mild pain or headache.     [name] amLODipine (NORVASC) 10 MG tablet Take 1 tablet (10 mg total) by mouth at bedtime. 90 tablet 3  . Blood Glucose Monitoring Suppl (BLOOD GLUCOSE MONITOR SYSTEM) w/Device KIT 1 kit by Does not apply route daily. 1 kit 0  . calcium acetate (PHOSLO) 667 MG capsule Take 2,001 mg by mouth 3 (three) times daily.    . carvedilol (COREG) 12.5 MG tablet Take 1 tablet (12.5 mg total) by mouth 2 (two) times daily with a meal. 60 tablet 5  . furosemide (LASIX) 40 MG tablet Take 40 mg by mouth every Monday, Wednesday, and Friday.    [name] HYDROcodone-acetaminophen (NORCO) 5-325 MG tablet Take 1 tablet by mouth every 6 (six) hours as needed for moderate pain. 20 tablet 0  . triamcinolone (KENALOG) 0.025 % ointment Apply 1 application topically 2 (two) times daily for 14 days. 30 g 0   No current facility-administered medications for this visit.     ROS:  See HPI  Physical Exam:    Incision:  B UE incisions healing well. Extremities:  B radial pulses palpable.  Doppler brisk signals left radial, ulnar and palmer. Right UE without weakness, pain or pallor.   Right  TDC    Assessment/Plan:  This is a 55 y.o. male who is s/p: Ligation of left basilic vein fistula 2.  New right radiocephalic AV fistula  on 02/08/87 by Dr. Scot Dock.   He will f/u in 4 weeks for fistula duplex on the right UE to check for maturity.     Roxy Horseman PA-C [facility] [phone]   Clinic MD:  Donzetta Matters

## 2021-01-03 ENCOUNTER — Other Ambulatory Visit: Payer: Self-pay | Admitting: *Deleted

## 2021-01-03 DIAGNOSIS — N185 Chronic kidney disease, stage 5: Secondary | ICD-10-CM

## 2021-01-10 ENCOUNTER — Ambulatory Visit (INDEPENDENT_AMBULATORY_CARE_PROVIDER_SITE_OTHER): Payer: 59 | Admitting: Physician Assistant

## 2021-01-10 ENCOUNTER — Other Ambulatory Visit: Payer: Self-pay

## 2021-01-10 ENCOUNTER — Ambulatory Visit (HOSPITAL_COMMUNITY)
Admission: RE | Admit: 2021-01-10 | Discharge: 2021-01-10 | Disposition: A | Discharge status: Home / Self Care | Payer: 59 | Source: Ambulatory Visit | Attending: Vascular Surgery | Admitting: Vascular Surgery

## 2021-01-10 VITALS — BP 158/76 | HR 63 | Temp 98.4°F | Resp 20 | Ht 70.0 in | Wt 184.0 lb

## 2021-01-10 DIAGNOSIS — N186 End stage renal disease: Secondary | ICD-10-CM | POA: Insufficient documentation

## 2021-01-10 DIAGNOSIS — Z992 Dependence on renal dialysis: Secondary | ICD-10-CM

## 2021-01-10 DIAGNOSIS — N185 Chronic kidney disease, stage 5: Secondary | ICD-10-CM | POA: Insufficient documentation

## 2021-01-10 NOTE — Progress Notes (Signed)
POST OPERATIVE OFFICE NOTE    CC:  F/u for surgery  HPI:  This is a 55 y.o. male who is s/p ligation of left BVT and creation of new right RC AVF on 12/05/20 by Dr. Scot Dock.    He was last seen on 12/15/2020 and at that time, the motion in his left hand had improved and was not cold anymore.  Bilateral radial pulses were palpable.     Pt states he does not have pain/numbness in the right hand.   He does continue to have weakness in the left hand and numbness of the left thumb and pointer finger.  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  He shows me that he cannot hold his phone and press the buttons on the side as his hand is weak.    The pt is on dialysis MWF at [facility] location. (pt lives in [place])   Allergies  Allergen Reactions  . Latex Rash and Other (See Comments)    Pt states he is allergic to latex condoms  . Ace Inhibitors Cough  . Wound Dressing Adhesive Itching and Rash    Allergy to tape adhesive    Current Outpatient Medications  Medication Sig Dispense Refill  . acetaminophen (TYLENOL) 325 MG tablet Take 650 mg by mouth every 6 (six) hours as needed for mild pain or headache.     [name] amLODipine (NORVASC) 10 MG tablet Take 1 tablet (10 mg total) by mouth at bedtime. 90 tablet 3  . Blood Glucose Monitoring Suppl (BLOOD GLUCOSE MONITOR SYSTEM) w/Device KIT 1 kit by Does not apply route daily. 1 kit 0  . calcium acetate (PHOSLO) 667 MG capsule Take 2,001 mg by mouth 3 (three) times daily.    . carvedilol (COREG) 12.5 MG tablet Take 1 tablet (12.5 mg total) by mouth 2 (two) times daily with a meal. 60 tablet 5  . furosemide (LASIX) 40 MG tablet Take 40 mg by mouth every Monday, Wednesday, and Friday.    [name] HYDROcodone-acetaminophen (NORCO) 5-325 MG tablet Take 1 tablet by mouth every 6 (six) hours as needed for moderate pain. 20 tablet 0   No current facility-administered medications for this visit.     ROS:  See  HPI  Physical Exam:  Today's Vitals   01/10/21 1413  BP: (!) 158/76  Pulse: 63  Resp: 20  Temp: 98.4 F (36.9 C)  TempSrc: Temporal  SpO2: 97%  Weight: 184 lb (83.5 kg)  Height: 5' 10" (1.778 m)   Body mass index is 26.4 kg/m.   Incision:  Well healed Extremities:   There is a palpable radial pulse bilaterally Motor and sensory are in tact.  The left hand has some weakness and sensory is not in tact in the tip of thumb and pointer finger. There is a thrill/bruit present.  The fistula is easily palpable   Dialysis Duplex on 01/10/2021: Diameter:  0.52-0.80cm Depth:  0.15-0.29cm  Competing branch forearm (proximal)   Assessment/Plan:  This is a 55 y.o. male who is s/p:  ligation of left BVT and creation of new right RC AVF on 12/05/20 by Dr. Scot Dock.   -the pt does not have evidence of steal in the right hand.  He does continue to have weakness in the left hand with numbness in the left thumb and pointer finger.  We will make referral to outpatient occupational therapy for this.  -the fistula/graft can be used 03/07/2021. -If  pt has a tunneled dialysis catheter and the access has been used successfully to the satisfaction of the dialysis center, the tunneled catheter can be scheduled to be removed at their discretion.   -I discussed with pt that access does not last forever and most likely will require some maintenance in the future.  He is hoping for kidney transplant.   -the pt will follow up as needed.   Leontine Locket, [facility] [phone]  Clinic MD:  Scot Dock

## 2021-01-23 ENCOUNTER — Encounter: Payer: Self-pay | Admitting: Occupational Therapy

## 2021-01-23 ENCOUNTER — Ambulatory Visit: Payer: 59 | Attending: Family Medicine | Admitting: Occupational Therapy

## 2021-01-23 ENCOUNTER — Other Ambulatory Visit: Payer: Self-pay

## 2021-01-23 DIAGNOSIS — R208 Other disturbances of skin sensation: Secondary | ICD-10-CM | POA: Diagnosis present

## 2021-01-23 DIAGNOSIS — R278 Other lack of coordination: Secondary | ICD-10-CM | POA: Diagnosis present

## 2021-01-23 DIAGNOSIS — M6281 Muscle weakness (generalized): Secondary | ICD-10-CM | POA: Diagnosis present

## 2021-01-23 NOTE — Therapy (Signed)
[place] [address] [place], [place], [address] Phone: [phone]   Fax:  [phone]  Occupational Therapy Evaluation  Patient Details  Name: [name] MRN: [id number] Date of Birth: October 15, 1965 Referring Provider (OT): 12/05/20   Encounter Date: 01/23/2021   OT End of Session - 01/23/21 1443    Visit Number 1    Number of Visits 8    Date for OT Re-Evaluation 03/27/21    Authorization Type [employer]/ Medicaid    Authorization Time Period await Medicaid auth- request more visits after inital 3 visit    Authorization - Visit Number 0    Authorization - Number of Visits 3    OT Start Time 1402    OT Stop Time 1435    OT Time Calculation (min) 33 min           Past Medical History:  Diagnosis Date  . Allergy   . Diabetes mellitus    Type 2 DM, Patient reports DM Type 1 diagnosd age at age 89  . Diabetic foot ulcers ([place])   . ESRD (end stage renal disease) ([place])    [facility]  . Foreign body in left foot    with  infection  . Hypertension   . Meniscus tear   . Multiple rib fractures   . Personal history of diabetic foot ulcer, Left Foot     Past Surgical History:  Procedure Laterality Date  . AV FISTULA PLACEMENT Left 11/06/2020   Procedure: LEFT ARM FIRST STAGE BRACHIAL BASILIC ARTERIOVENOUS (AV) FISTULA CREATION;  Surgeon: Marty Heck, MD;  Location: [place];  Service: Vascular;  Laterality: Left;  . AV FISTULA PLACEMENT Right 12/05/2020   Procedure: RADIOCEPHALIC ARTERIOVENOUS (AV) FISTULA CREATION RIGHT ARM;  Surgeon: Angelia Mould, MD;  Location: [place];  Service: Vascular;  Laterality: Right;  . EYE SURGERY Bilateral    [place]  . I & D EXTREMITY  04/23/2012   Procedure: IRRIGATION AND DEBRIDEMENT EXTREMITY;  Surgeon: Newt Minion, MD;  Location: [place];  Service: Orthopedics;  Laterality: Left;  Irrigation and Debridement Left Foot  . I & D EXTREMITY   09/18/2012   Procedure: IRRIGATION AND DEBRIDEMENT EXTREMITY;  Surgeon: Newt Minion, MD;  Location: [place];  Service: Orthopedics;  Laterality: Left;  . IR PERC TUN PERIT CATH WO PORT S&I D[facility]a [facility]  08/30/2020  . IR US GUIDE VASC ACCESS RIGHT  08/30/2020  . LIGATION OF ARTERIOVENOUS  FISTULA Left 12/05/2020   Procedure: LIGATION OF ARTERIOVENOUS  FISTULA LEFT ARM;  Surgeon: Angelia Mould, MD;  Location: [place];  Service: Vascular;  Laterality: Left;  . MENISCUS REPAIR Left 04/2016   Orthopedist, Dr [name] in [place], [place]    There were no vitals filed for this visit.   Subjective Assessment - 01/23/21 1410    Subjective  Pt reports numbness in his left hand    Pertinent History Pt has dialysis M/W/F, pt is currently using a porta cath    Patient Stated Goals get hand working    Currently in Pain? No/denies             [facility] OT Assessment - 01/23/21 1412      Assessment   Medical Diagnosis LUE weakness with steal syndrome    Referring Provider (OT) 12/05/20    Hand Dominance Right    Prior Therapy PT      Precautions   Precaution Comments RUE no lifting over 10  lbs and no BP due to new fistula      Restrictions   Other Position/Activity Restrictions 10# with RUE due to dialysis catheter      Balance Screen   Has the patient fallen in the past 6 months No    Has the patient had a decrease in activity level because of a fear of falling?  No    Is the patient reluctant to leave their home because of a fear of falling?  No      Home  Environment   Family/patient expects to be discharged to: Private residence      Prior Function   Level of Independence Independent    Vocation On disability    Leisure Arts and crafts, music, dancing      ADL   Eating/Feeding Needs assist with cutting food    Grooming Modified independent    Upper Body Bathing Modified independent    [place] independent    Upper Body Dressing Needs assist for fasteners    Lower  Body Dressing Modified independent    Toilet Transfer Modified independent    Tub/Shower Transfer Independent    ADL comments Difficulty opening jars      IADL   Shopping Takes care of all shopping needs independently    [place] alone or with occasional assistance    Meal Prep Able to complete simple warm meal prep   has burned left hand     Mobility   Mobility Status Independent      Written Expression   Dominant Hand Right      Cognition   Overall Cognitive Status Within Functional Limits for tasks assessed      Sensation   Light Touch Impaired by gross assessment   for index and thumb of Left hand   Hot/Cold Impaired Detail   for index and thumb of left hand     Coordination   Gross Motor Movements are Fluid and Coordinated No    Fine Motor Movements are Fluid and Coordinated No    9 Hole Peg Test Right;Left    Right 9 Hole Peg Test 28.97    Left 9 Hole Peg Test 43    Coordination impaired for LUE      ROM / Strength   AROM / PROM / Strength AROM      AROM   Overall AROM  Deficits    Overall AROM Comments Unable to oppose 5th digit with small finger , MP flexion for thumb 30, unable to perform IP flexion, composite finger flexion/ extension WFLs      Left Hand AROM   L Thumb MCP 0-60 30 Degrees    L Thumb IP 0-80 0 Degrees    L Thumb Opposition to Index --   unable to oppose 5th digit     Hand Function   Right Hand Grip (lbs) 50.7    Right Hand Lateral Pinch 10 lbs    Right Hand 3 Point Pinch 8 lbs   tip 6   Left Hand Grip (lbs) 22.2    Left Hand Lateral Pinch 2 lbs    Left 3 point pinch 2 lbs   2 lbs                            OT Short Term Goals - 01/23/21 1634      OT SHORT TERM GOAL #1   Title I with  initial HEP    Baseline dependent    Time 4    Period Weeks    Status New    Target Date 02/20/21      OT SHORT TERM GOAL #2   Title Pt will verbalize understanding of sensory precautions for ADLS/IADLs.     Baseline dependent    Time 4    Period Weeks    Status New      OT SHORT TERM GOAL #3   Title Pt will increase left grip strength by 3 lbs for increased LUE functional use during ADLS.    Baseline RUE28.97 lbs  LUE 43    Time 4    Period Weeks    Status New             OT Long Term Goals - 01/23/21 1638      OT LONG TERM GOAL #1   Title Pt will increase left 3 pt, tip  and lateral pinch by 2 lbs for increased functional use.    Baseline LUE 3 pt 2 lbs, lateral 2 lbs, tip 2 lbs    Time 8    Period Weeks    Status New    Target Date 03/20/21      OT LONG TERM GOAL #2   Title Pt will demonstrate ability to carry a lightweight object in right and left UE's simultaneously without drops.    Baseline frequently dropping items out of left hand    Time 8    Period Weeks    Status New      OT LONG TERM GOAL #3   Title Pt will demonstrate increased ease with opening plastic bags.    Baseline Pt reports significant difficulty opening plastic bags    Time 8    Period Weeks    Status New      OT LONG TERM GOAL #4   Title Pt will demonstrate ability to oppose 5th digit with thumb for increased functional use of LUE.    Baseline unable    Time 8    Period Weeks    Status New      OT LONG TERM GOAL #5   Title Pt will verbalize understanding of adapted strategies/ equipment to increase ease with ADLS/IADLs prn.    Baseline dependent    Time 8    Period Weeks    Status New                 Plan - 01/23/21 1447    Clinical Impression Statement This is a 55 y.o. male who is s/p  first stage basilic fistula on [id number] by Dr. Arvil Persons  He developed  a steal after a first stage left basilic vein transposition.  He underwent for ligation of the fistula LUE  and placement of new access on RUE by Dr.Dickson 12/05/20. Pt presents with the following deficits: decreased strength, decreased coordination, sensory deficits, decreased functional use of LUE, pain which impedes  performance of ADLS/IADLs. Pt can benefit from skilled occupational therapy to address these deficits in order to maximize pt's safety and I with daily activities.    OT Occupational Profile and History Problem Focused Assessment - Including review of records relating to presenting problem    Occupational performance deficits (Please refer to evaluation for details): ADL's;IADL's;Work;Social Participation    Body Structure / Function / Physical Skills ADL;Edema;Strength;FMC;Coordination;Decreased knowledge of precautions;GMC;ROM;IADL;Dexterity;Sensation;Decreased knowledge of use of DME;Flexibility    Rehab Potential Good    Clinical  Decision Making Limited treatment options, no task modification necessary    Comorbidities Affecting Occupational Performance: May have comorbidities impacting occupational performance    Modification or Assistance to Complete Evaluation  No modification of tasks or assist necessary to complete eval    OT Frequency --   1x week x 3 weeks followed by 2x week x 4 weeks plus eval or 12 visits total   OT Treatment/Interventions Self-care/ADL training;Therapeutic exercise;Patient/family education;Passive range of motion;Moist Heat;Fluidtherapy;Cryotherapy;Paraffin;DME and/or AE instruction;Ultrasound;Neuromuscular education;Manual Therapy;Splinting;Therapeutic activities    Plan initate HEP for coordination and strength LUE    Consulted and Agree with Plan of Care Patient           Patient will benefit from skilled therapeutic intervention in order to improve the following deficits and impairments:   Body Structure / Function / Physical Skills: ADL,Edema,Strength,FMC,Coordination,Decreased knowledge of precautions,GMC,ROM,IADL,Dexterity,Sensation,Decreased knowledge of use of DME,Flexibility       Visit Diagnosis: Muscle weakness (generalized)  Other lack of coordination  Other disturbances of skin sensation    Problem List Patient Active Problem List    Diagnosis Date Noted  . End stage renal disease ([place]) 01/10/2021  . Night sweats 10/20/2020  . Subconjunctival hemorrhage of right eye 09/04/2020  . Hypoglycemia 08/03/2020  . Diarrhea 08/02/2020  . Hypertensive emergency 07/27/2020  . Nephrotic range proteinuria 07/27/2020  . Acute stress reaction 07/25/2020  . Chronic kidney disease, stage 5 ([place]) 03/07/2020  . Hematuria 03/07/2020  . Foot callus 07/01/2018  . Hyperlipidemia 05/28/2016  . Hypertriglyceridemia 05/28/2016  . AKI (acute kidney injury) ([place]) 10/09/2012  . Type 2 diabetes mellitus with stage 4 chronic kidney disease, without long-term current use of insulin ([place]) 06/01/2012  . Hypertension 04/14/2012  . DM (diabetes mellitus), type 2, uncontrolled ([place]) 04/13/2012    Kasmira Cacioppo 01/23/2021, 4:44 PM Theone Murdoch, OTR/L [phone] Phone: [phone] 4:46 PM 01/23/21 [place] [address] [place], [place], [address] Phone: [phone]   Fax:  [phone]  Name: Yoandy Parr MRN: [id number] Date of Birth: 19-Jul-1966

## 2021-01-25 ENCOUNTER — Ambulatory Visit (INDEPENDENT_AMBULATORY_CARE_PROVIDER_SITE_OTHER): Payer: 59 | Admitting: Family Medicine

## 2021-01-25 ENCOUNTER — Other Ambulatory Visit: Payer: Self-pay

## 2021-01-25 ENCOUNTER — Encounter: Payer: Self-pay | Admitting: Family Medicine

## 2021-01-25 VITALS — BP 154/70 | HR 62 | Ht 70.0 in | Wt 183.5 lb

## 2021-01-25 DIAGNOSIS — M21921 Unspecified acquired deformity of right upper arm: Secondary | ICD-10-CM | POA: Diagnosis not present

## 2021-01-25 DIAGNOSIS — E1122 Type 2 diabetes mellitus with diabetic chronic kidney disease: Secondary | ICD-10-CM

## 2021-01-25 DIAGNOSIS — N184 Chronic kidney disease, stage 4 (severe): Secondary | ICD-10-CM

## 2021-01-25 DIAGNOSIS — M7022 Olecranon bursitis, left elbow: Secondary | ICD-10-CM

## 2021-01-25 DIAGNOSIS — M7021 Olecranon bursitis, right elbow: Secondary | ICD-10-CM | POA: Insufficient documentation

## 2021-01-25 DIAGNOSIS — E1165 Type 2 diabetes mellitus with hyperglycemia: Secondary | ICD-10-CM | POA: Diagnosis not present

## 2021-01-25 LAB — POCT GLYCOSYLATED HEMOGLOBIN (HGB A1C): HbA1c, POC (controlled diabetic range): 5.6 % (ref 0.0–7.0)

## 2021-01-25 NOTE — Assessment & Plan Note (Addendum)
Possibly calcification in the setting of ESRD?  Much less likely malignancy. He denies any pain or other bothersome symptoms at this time.  Will obtain x-ray.

## 2021-01-25 NOTE — Assessment & Plan Note (Signed)
A1c is 5.7 today. Last check in October was 5.2.  It is likely that this is falsely lowered due to his ESRD.  He denies any hyperglycemic symptoms at this time.  He is not on any medications. Will continue to monitor BMPs. Recheck A1C in 3-6 months.

## 2021-01-25 NOTE — Assessment & Plan Note (Signed)
No evidence of infection at this time.  Discussed compression versus aspiration.  Patient reports that this is not severely limiting his quality of life and is not experiencing any pain and prefers to use compression therapy rather than aspiration given its inherent risk of infection.

## 2021-01-25 NOTE — Patient Instructions (Signed)
Elbow  - Go to [facility] to get your x ray. I will call you with the results.

## 2021-01-25 NOTE — Progress Notes (Signed)
   SUBJECTIVE:  CHIEF COMPLAINT / HPI:   Fluid in Both Elbows Recently started on HD with AVF in right arm. Reports swelling started about a week ago in bilateral elbows.  Denies any recent trauma. He denies any fevers, chills, pain, erythema, warmth. He also reports that there has been an hardened area just below the elbow swelling on his right arm only. He does report that it has been getting better and also noticed this last week.  He gets labs checked weekly at dialysis and everything has been normal.   Diabetes  Not currently on any medications. Denies polyuria, polydipsia, unusual visual symptoms, changes in weight. Denies numbness, tingling, pain in extremities. Denies chest pain, dyspnea, recent TIA.   PERTINENT  PMH / PSH: ESRD, DM, HTN   OBJECTIVE:  BP (!) 154/70   Pulse 62   Ht '5\' 10"'$  (1.778 m)   Wt 183 lb 8 oz (83.2 kg)   SpO2 97%   BMI 26.33 kg/m   General: well appearing, NAD  MSK: swelling of b/l elbows on posterior aspect. No pain or warmth on palpation. No erythema. Fluid filled sac appreciated bilaterally. Also an irregularly shaped elevation on posterior proximal forearm.  Also nontender on palpation with no warmth and no erythema. It is hard and has an undulating non-smooth surface. On palpation, the growth feels distinctly separate from fluid collection.   ASSESSMENT/PLAN:  DM (diabetes mellitus), type 2, uncontrolled (HCC) A1c is 5.7 today. Last check in October was 5.2.  It is likely that this is falsely lowered due to his ESRD.  He denies any hyperglycemic symptoms at this time.  He is not on any medications. Will continue to monitor BMPs. Recheck A1C in 3-6 months.   Olecranon bursitis of both elbows No evidence of infection at this time.  Discussed compression versus aspiration.  Patient reports that this is not severely limiting his quality of life and is not experiencing any pain and prefers to use compression therapy rather than aspiration given its  inherent risk of infection.  Elbow deformity, right Possibly calcification in the setting of ESRD?  Much less likely malignancy. He denies any pain or other bothersome symptoms at this time.  Will obtain x-ray.     Earl Oliphant, MD [place]

## 2021-01-26 ENCOUNTER — Ambulatory Visit
Admission: RE | Admit: 2021-01-26 | Discharge: 2021-01-26 | Disposition: A | Discharge status: Home / Self Care | Payer: 59 | Source: Ambulatory Visit | Attending: Family Medicine | Admitting: Family Medicine

## 2021-01-26 DIAGNOSIS — M21921 Unspecified acquired deformity of right upper arm: Secondary | ICD-10-CM

## 2021-01-28 ENCOUNTER — Encounter: Payer: Self-pay | Admitting: Family Medicine

## 2021-02-06 ENCOUNTER — Encounter: Payer: Self-pay | Admitting: Occupational Therapy

## 2021-02-06 ENCOUNTER — Ambulatory Visit: Payer: 59 | Attending: Family Medicine | Admitting: Occupational Therapy

## 2021-02-06 ENCOUNTER — Other Ambulatory Visit: Payer: Self-pay

## 2021-02-06 DIAGNOSIS — R208 Other disturbances of skin sensation: Secondary | ICD-10-CM | POA: Diagnosis present

## 2021-02-06 DIAGNOSIS — R262 Difficulty in walking, not elsewhere classified: Secondary | ICD-10-CM | POA: Insufficient documentation

## 2021-02-06 DIAGNOSIS — R278 Other lack of coordination: Secondary | ICD-10-CM | POA: Insufficient documentation

## 2021-02-06 DIAGNOSIS — M6281 Muscle weakness (generalized): Secondary | ICD-10-CM | POA: Insufficient documentation

## 2021-02-06 NOTE — Progress Notes (Deleted)
    SUBJECTIVE:   CHIEF COMPLAINT / HPI:   Rash on right arm around catheter site   HM  Colonoscopy   Ophth  Foot exam    PERTINENT  PMH / PSH: ***  OBJECTIVE:   There were no vitals taken for this visit.  ***  ASSESSMENT/PLAN:   No problem-specific Assessment & Plan notes found for this encounter.     Earl Oliphant, MD [place]   {    This will disappear when note is signed, click to select method of visit    :1}

## 2021-02-06 NOTE — Patient Instructions (Signed)
Flexor Tendon Gliding (Active Hook Fist)   With fingers and knuckles straight, bend middle and tip joints. Do not bend large knuckles. Repeat _10-15___ times. Do _2-3__ sessions per day.  MP Flexion (Active)   With back of hand on table, bend large knuckles as far as they will go, keeping small joints straight. Repeat _10-15___ times. Do __2-3__ sessions per day. Activity: Reach into a narrow container.*      Finger Flexion / Extension   With palm up, bend fingers of left hand toward palm, making a  fist. Straighten fingers, opening fist. Repeat sequence _10-15___ times per session. Do _2-3__ sessions per day. Hand Variation: Palm down   Copyright  VHI. All rights reserved.      Opposition (Active)   Touch tip of thumb to nail tip of each finger in turn, making an "O" shape. Repeat __10__ times. Do _2-3__ sessions per day.   MP Flexion (Active)   Bend thumb to touch base of little finger, keeping tip joint straight. Repeat __10-15__ times. Do _2-3___ sessions per day.       Coordination Exercises  Perform the following exercises for 20 minutes 1 times per day. Perform with left hand(s). Perform using big movements.   Flipping Cards: Place deck of cards on the table. Flip cards over by opening your hand big to grasp and then turn your palm up big.  Deal cards: Hold 1/2 or whole deck in your hand. Use thumb to push card off top of deck with one big push.  Rotate ball with fingertips: Pick up with fingers/thumb and move as much as you can with each turn/movement (clockwise and counter-clockwise).  Pick up coins and place in coin bank or container: Pick up with big, intentional movements. Do not drag coin to the edge.  Pick up coins and stack one at a time: Pick up with big, intentional movements. Do not drag coin to the edge. (5-10 in a stack)  Pick up 5-10 coins one at a time and hold in palm. Then, move coins from palm to fingertips one at time and place in  coin bank/container.

## 2021-02-06 NOTE — Therapy (Signed)
[facility] [address] [employer] [place], [place], [address] Phone: [phone]   Fax:  [phone]  Occupational Therapy Treatment  Patient Details  Name: [name] MRN: [id number] Date of Birth: [birth year] Referring Provider (OT): 12/05/20   Encounter Date: 02/06/2021   OT End of Session - 02/06/21 0851    Visit Number 2    Number of Visits 8    Date for OT Re-Evaluation 03/27/21    Authorization Type [employer]/ Medicaid    OT Start Time 0848    OT Stop Time 0930    OT Time Calculation (min) 42 min           Past Medical History:  Diagnosis Date  . Allergy   . Diabetes mellitus    Type 2 DM, Patient reports DM Type 1 diagnosd age at age 38  . Diabetic foot ulcers ([place])   . ESRD (end stage renal disease) ([place])    [facility]  . Foot callus 07/01/2018  . Foreign body in left foot    with  infection  . Hypertension   . Meniscus tear   . Multiple rib fractures   . Personal history of diabetic foot ulcer, Left Foot   . Type 2 diabetes mellitus with stage 4 chronic kidney disease, without long-term current use of insulin ([place]) 06/01/2012    Past Surgical History:  Procedure Laterality Date  . AV FISTULA PLACEMENT Left 11/06/2020   Procedure: LEFT ARM FIRST STAGE BRACHIAL BASILIC ARTERIOVENOUS (AV) FISTULA CREATION;  Surgeon: Marty Heck, MD;  Location: [facility];  Service: Vascular;  Laterality: Left;  . AV FISTULA PLACEMENT Right 12/05/2020   Procedure: RADIOCEPHALIC ARTERIOVENOUS (AV) FISTULA CREATION RIGHT ARM;  Surgeon: Angelia Mould, MD;  Location: [facility];  Service: Vascular;  Laterality: Right;  . EYE SURGERY Bilateral    lazer  . I & D EXTREMITY  04/23/2012   Procedure: IRRIGATION AND DEBRIDEMENT EXTREMITY;  Surgeon: Newt Minion, MD;  Location: [facility];  Service: Orthopedics;  Laterality: Left;  Irrigation and Debridement Left Foot  . I & D EXTREMITY  09/18/2012   Procedure:  IRRIGATION AND DEBRIDEMENT EXTREMITY;  Surgeon: Newt Minion, MD;  Location: [facility];  Service: Orthopedics;  Laterality: Left;  . IR PERC TUN PERIT CATH WO PORT S&I [place]  08/30/2020  . IR US GUIDE VASC ACCESS RIGHT  08/30/2020  . LIGATION OF ARTERIOVENOUS  FISTULA Left 12/05/2020   Procedure: LIGATION OF ARTERIOVENOUS  FISTULA LEFT ARM;  Surgeon: Angelia Mould, MD;  Location: [facility];  Service: Vascular;  Laterality: Left;  . MENISCUS REPAIR Left 04/2016   Orthopedist, Dr [name] in [place], [place]    There were no vitals filed for this visit.   Subjective Assessment - 02/06/21 0851    Subjective  Denies pain    Pertinent History Pt has dialysis M/W/F, pt is currently using a porta cath    Patient Stated Goals get hand working    Currently in Pain? No/denies                 Treatment: Placing large to small  pegs into semicircle pegboard with LUE, min difficulty/ v.c to avoid compensation. Pt reports burning left hand with a glue gun, pt was instructed to exercise caution with any activities requiring use of heat and to watch his left hand due to numbness. Therapist recommends pt considers getting heat safe gloves for safety and therapist discussed safety for  cooking as well due to impaired sensation.Pt verbalized understanding . Placing and removing large to small pegs from pegboard, min- mod difficulty/ v. Cues  Graded clothespins yellow-green for sustained pinch, min-mod difficulty/v.c               OT Education - 02/06/21 0916    Education Details A/ROM and coordination HEP for LUE, see pt instructions.    Person(s) Educated Patient    Methods Explanation;Demonstration;Verbal cues;Handout    Comprehension Verbalized understanding;Returned demonstration;Verbal cues required            OT Short Term Goals - 01/23/21 1634      OT SHORT TERM GOAL #1   Title I with initial HEP    Baseline dependent    Time 4    Period Weeks    Status New    Target  Date 02/20/21      OT SHORT TERM GOAL #2   Title Pt will verbalize understanding of sensory precautions for ADLS/IADLs.    Baseline dependent    Time 4    Period Weeks    Status New      OT SHORT TERM GOAL #3   Title Pt will increase left grip strength by 3 lbs for increased LUE functional use during ADLS.    Baseline RUE28.97 lbs  LUE 43    Time 4    Period Weeks    Status New             OT Long Term Goals - 01/23/21 1638      OT LONG TERM GOAL #1   Title Pt will increase left 3 pt, tip  and lateral pinch by 2 lbs for increased functional use.    Baseline LUE 3 pt 2 lbs, lateral 2 lbs    Time 8    Period Weeks    Status New    Target Date 03/20/21      OT LONG TERM GOAL #2   Title Pt will demonstrate ability to carry a lightweight object in right and left UE's simultaneously without drops.    Baseline frequently dropping items out of left hand    Time 8    Period Weeks    Status New      OT LONG TERM GOAL #3   Title Pt will demonstrate increased ease with opening plastic bags.    Baseline Pt reports significant difficulty opening plastic bags    Time 8    Period Weeks    Status New      OT LONG TERM GOAL #4   Title Pt will demonstrate ability to oppose 5th digit with thumb for increased functional use of LUE.    Baseline unable    Time 8    Period Weeks    Status New      OT LONG TERM GOAL #5   Title Pt will verbalize understanding of adapted strategies/ equipment to increase ease with ADLS/IADLs prn.    Baseline dependent    Time 8    Period Weeks    Status New                 Plan - 02/06/21 [id number]    Clinical Impression Statement Pt is progressing towards goals. He demonstrates understanding of HEP.    OT Occupational Profile and History Problem Focused Assessment - Including review of records relating to presenting problem    Occupational performance deficits (Please refer to evaluation for details): ADL's;IADL's;Work;Social Participation  Body Structure / Function / Physical Skills ADL;Edema;Strength;FMC;Coordination;Decreased knowledge of precautions;GMC;ROM;IADL;Dexterity;Sensation;Decreased knowledge of use of DME;Flexibility    Rehab Potential Good    Clinical Decision Making Limited treatment options, no task modification necessary    Comorbidities Affecting Occupational Performance: May have comorbidities impacting occupational performance    Modification or Assistance to Complete Evaluation  No modification of tasks or assist necessary to complete eval    OT Frequency --   1x week x 3 weeks followed by 2x week x 4 weeks plus eval or 12 visits total   OT Treatment/Interventions Self-care/ADL training;Therapeutic exercise;Patient/family education;Passive range of motion;Moist Heat;Fluidtherapy;Cryotherapy;Paraffin;DME and/or AE instruction;Ultrasound;Neuromuscular education;Manual Therapy;Splinting;Therapeutic activities    Plan continue to address LUE coordination and functional use.    Consulted and Agree with Plan of Care Patient           Patient will benefit from skilled therapeutic intervention in order to improve the following deficits and impairments:   Body Structure / Function / Physical Skills: ADL,Edema,Strength,FMC,Coordination,Decreased knowledge of precautions,GMC,ROM,IADL,Dexterity,Sensation,Decreased knowledge of use of DME,Flexibility       Visit Diagnosis: Muscle weakness (generalized)  Other lack of coordination  Other disturbances of skin sensation    Problem List Patient Active Problem List   Diagnosis Date Noted  . Elbow deformity, right 01/25/2021  . Olecranon bursitis of both elbows 01/25/2021  . End stage renal disease ([place]) 01/10/2021  . Hyperlipidemia 05/28/2016  . Hypertension 04/14/2012  . DM (diabetes mellitus), type 2, uncontrolled ([place]) 04/13/2012    Dillyn Joaquin 02/06/2021, 9:21 AM  [facility] [address] [place], [place], [address] Phone: [phone]   Fax:  [phone]  Name: Neill Rowan MRN: [id number] Date of Birth: [birth year]

## 2021-02-07 ENCOUNTER — Ambulatory Visit: Payer: 59 | Admitting: Family Medicine

## 2021-02-13 ENCOUNTER — Ambulatory Visit: Payer: 59 | Admitting: Occupational Therapy

## 2021-02-20 ENCOUNTER — Ambulatory Visit: Payer: 59 | Admitting: Occupational Therapy

## 2021-02-20 ENCOUNTER — Other Ambulatory Visit: Payer: Self-pay

## 2021-02-20 DIAGNOSIS — M6281 Muscle weakness (generalized): Secondary | ICD-10-CM | POA: Diagnosis not present

## 2021-02-20 DIAGNOSIS — R262 Difficulty in walking, not elsewhere classified: Secondary | ICD-10-CM

## 2021-02-20 DIAGNOSIS — R208 Other disturbances of skin sensation: Secondary | ICD-10-CM

## 2021-02-20 DIAGNOSIS — R278 Other lack of coordination: Secondary | ICD-10-CM

## 2021-02-20 NOTE — Therapy (Addendum)
[facility] [address] [place], [place], [address] Phone: [phone]   Fax:  [phone]  Occupational Therapy Treatment  Patient Details  Name: [name] MRN: [id number] Date of Birth: 17-Apr-1966 Referring Provider (OT): 12/05/20   Encounter Date: 02/20/2021   OT End of Session - 02/20/21 0808     Visit Number 3    Number of Visits 13 (corrected count)   Date for OT Re-Evaluation 03/27/21    Authorization Type [employer]/ Medicaid    Authorization Time Period 3 visits approved through 5/23    Authorization - Visit Number 2    Authorization - Number of Visits 3    OT Start Time 0803    OT Stop Time 0843    OT Time Calculation (min) 40 min    Behavior During Therapy [facility] for tasks assessed/performed             Past Medical History:  Diagnosis Date   Allergy    Diabetes mellitus    Type 2 DM, Patient reports DM Type 1 diagnosd age at age 55   Diabetic foot ulcers ([place])    ESRD (end stage renal disease) ([place])    [facility]   Foot callus 07/01/2018   Foreign body in left foot    with  infection   Hypertension    Meniscus tear    Multiple rib fractures    Personal history of diabetic foot ulcer, Left Foot    Type 2 diabetes mellitus with stage 4 chronic kidney disease, without long-term current use of insulin ([place]) 06/01/2012    Past Surgical History:  Procedure Laterality Date   AV FISTULA PLACEMENT Left 11/06/2020   Procedure: LEFT ARM FIRST STAGE BRACHIAL BASILIC ARTERIOVENOUS (AV) FISTULA CREATION;  Surgeon: Marty Heck, MD;  Location: [facility];  Service: Vascular;  Laterality: Left;   AV FISTULA PLACEMENT Right 12/05/2020   Procedure: RADIOCEPHALIC ARTERIOVENOUS (AV) FISTULA CREATION RIGHT ARM;  Surgeon: Angelia Mould, MD;  Location: [facility];  Service: Vascular;  Laterality: Right;   EYE SURGERY Bilateral    lazer   I & D EXTREMITY  04/23/2012   Procedure:  IRRIGATION AND DEBRIDEMENT EXTREMITY;  Surgeon: Newt Minion, MD;  Location: [facility];  Service: Orthopedics;  Laterality: Left;  Irrigation and Debridement Left Foot   I & D EXTREMITY  09/18/2012   Procedure: IRRIGATION AND DEBRIDEMENT EXTREMITY;  Surgeon: Newt Minion, MD;  Location: [facility];  Service: Orthopedics;  Laterality: Left;   IR PERC TUN PERIT CATH WO PORT S&I /IMAG  08/30/2020   IR US GUIDE VASC ACCESS RIGHT  08/30/2020   LIGATION OF ARTERIOVENOUS  FISTULA Left 12/05/2020   Procedure: LIGATION OF ARTERIOVENOUS  FISTULA LEFT ARM;  Surgeon: Angelia Mould, MD;  [facility];  Service: Vascular;  Laterality: Left;   MENISCUS REPAIR Left 04/2016   Orthopedist, Dr [name] in [place], [place]    There were no vitals filed for this visit.   Subjective Assessment - 02/20/21 0808     Pertinent History Pt has dialysis M/W/F, pt is currently using a porta cath    Patient Stated Goals get hand working    Currently in Pain? No/denies                    Treatment: Grooved pegboard to place and remove pegs for increased fine motor coordination in LUE, mod difficulty/ v.c Fluidotherapy x 9 mins to LUE for  stiffness, pain, no adverse reactions.  Flipping and dealing playing cards with LUE, for increased fine motor coordination min-mod difficulty and min v.c            OT Education - 02/20/21 0836     Education Details putty  HEP-red    Person(s) Educated Patient    Methods Explanation;Demonstration;Verbal cues;Handout    Comprehension Verbalized understanding;Returned demonstration;Verbal cues required              OT Short Term Goals - 01/23/21 1634       OT SHORT TERM GOAL #1   Title I with initial HEP    Baseline dependent    Time 4    Period Weeks    Status Achieved I demonstrates understanding   Target Date 02/20/21      OT SHORT TERM GOAL #2   Title Pt will verbalize understanding of sensory precautions for ADLS/IADLs.    Baseline dependent     Time 4    Period Weeks    Status Achieved demonstrates understanding     OT SHORT TERM GOAL #3   Title Pt will increase left grip strength by 3 lbs for increased LUE functional use during ADLS.    Baseline RUE28.97 lbs  LUE 43    Time 4    Period Weeks    Status Ongoing - goal not yet met, putty issued              OT Long Term Goals - 01/23/21 1638       OT LONG TERM GOAL #1   Title Pt will increase left 3 pt, tip  and lateral pinch by 2 lbs for increased functional use.    Baseline LUE 3 pt 2 lbs, lateral 2 lbs    Time 8    Period Weeks    Status Ongoing- not met yet pt only seen x 2 visits   Target Date 03/20/21      OT LONG TERM GOAL #2   Title Pt will demonstrate ability to carry a lightweight object in right and left UE's simultaneously without drops.    Baseline frequently dropping items out of left hand    Time 8    Period Weeks    Status Ongoing, continues to drop     OT LONG TERM GOAL #3   Title Pt will demonstrate increased ease with opening plastic bags.    Baseline Pt reports significant difficulty opening plastic bags    Time 8    Period Weeks    Status Ongoing, continued difficulty     OT LONG TERM GOAL #4   Title Pt will demonstrate ability to oppose 5th digit with thumb for increased functional use of LUE.    Baseline unable    Time 8    Period Weeks    Status Ongoing, not met yet     OT LONG TERM GOAL #5   Title Pt will verbalize understanding of adapted strategies/ equipment to increase ease with ADLS/IADLs prn.    Baseline dependent    Time 8    Period Weeks    Status Ongoing, not yet addressed                  Plan - 02/20/21 0840     Clinical Impression Statement Pt is progressing towards goals. He demonstrates understanding putty HEP. Pt has achieved 2/3 short term goals, and he is progressing towards long term goals yet has only been seen for 2  visits.   OT Occupational Profile and History Problem Focused Assessment -  Including review of records relating to presenting problem    Occupational performance deficits (Please refer to evaluation for details): ADL's;IADL's;Work;Social Participation    Body Structure / Function / Physical Skills ADL;Edema;Strength;FMC;Coordination;Decreased knowledge of precautions;GMC;ROM;IADL;Dexterity;Sensation;Decreased knowledge of use of DME;Flexibility    Rehab Potential Good    Clinical Decision Making Limited treatment options, no task modification necessary    Comorbidities Affecting Occupational Performance: May have comorbidities impacting occupational performance    Modification or Assistance to Complete Evaluation  No modification of tasks or assist necessary to complete eval    OT Frequency --   1x week x 3 weeks followed by 2x week x 4 weeks plus eval or 12 visits total   OT Treatment/Interventions Self-care/ADL training;Therapeutic exercise;Patient/family education;Passive range of motion;Moist Heat;Fluidtherapy;Cryotherapy;Paraffin;DME and/or AE instruction;Ultrasound;Neuromuscular education;Manual Therapy;Splinting;Therapeutic activities    Plan continue to address LUE coordination and functional use, can do fluidotherapy- keep in mind .impaired sensation    Consulted and Agree with Plan of Care Patient             Patient will benefit from skilled therapeutic intervention in order to improve the following deficits and impairments:   Body Structure / Function / Physical Skills: ADL,Edema,Strength,FMC,Coordination,Decreased knowledge of precautions,GMC,ROM,IADL,Dexterity,Sensation,Decreased knowledge of use of DME,Flexibility       Visit Diagnosis: Muscle weakness (generalized)  Other lack of coordination  Other disturbances of skin sensation  Difficulty in walking, not elsewhere classified    Problem List Patient Active Problem List   Diagnosis Date Noted   Elbow deformity, right 01/25/2021   Olecranon bursitis of both elbows 01/25/2021   End  stage renal disease ([place]) 01/10/2021   Hyperlipidemia 05/28/2016   Hypertension 04/14/2012   DM (diabetes mellitus), type 2, uncontrolled ([employer]) 04/13/2012    Nahiara Kretzschmar 02/20/2021, 8:55 AM Theone Murdoch, OTR/L [phone] Phone: [phone] 8:55 AM 02/20/21 [facility] [address] [place], [place], [address] Phone: [phone]   Fax:  [phone]  Name: [name] MRN: [id number] Date of Birth: [birth year]

## 2021-02-20 NOTE — Patient Instructions (Signed)
1. Grip Strengthening (Resistive Putty)   Squeeze putty using thumb and all fingers. Repeat _20___ times. Do __2__ sessions per day.   2. Roll putty into tube on table and pinch between each finger and thumb x 10 reps each. (can do ring and small finger together)     Copyright  [employer]. All rights reserved.   

## 2021-03-06 ENCOUNTER — Ambulatory Visit: Payer: 59 | Admitting: Occupational Therapy

## 2021-03-08 ENCOUNTER — Ambulatory Visit: Payer: 59 | Admitting: Occupational Therapy

## 2021-03-13 ENCOUNTER — Other Ambulatory Visit: Payer: Self-pay

## 2021-03-13 ENCOUNTER — Encounter: Payer: Self-pay | Admitting: Occupational Therapy

## 2021-03-13 ENCOUNTER — Ambulatory Visit: Payer: 59 | Attending: Family Medicine | Admitting: Occupational Therapy

## 2021-03-13 DIAGNOSIS — M6281 Muscle weakness (generalized): Secondary | ICD-10-CM | POA: Diagnosis present

## 2021-03-13 DIAGNOSIS — R208 Other disturbances of skin sensation: Secondary | ICD-10-CM | POA: Diagnosis present

## 2021-03-13 DIAGNOSIS — R278 Other lack of coordination: Secondary | ICD-10-CM | POA: Diagnosis present

## 2021-03-13 NOTE — Therapy (Addendum)
[facility] [address] [place], [place], [address] Phone: [phone]   Fax:  [phone]  Occupational Therapy Treatment  Patient Details  Name: Devlyn Placek MRN: [id number] Date of Birth: May 23, 1966 Referring Provider (OT): 12/05/20   Encounter Date: 03/13/2021   OT End of Session - 03/13/21 0853     Visit Number 4    Number of Visits 12    Date for OT Re-Evaluation 03/27/21    Authorization Type [employer]/ Medicaid    OT Start Time 0845    OT Stop Time 0925    OT Time Calculation (min) 40 min             Past Medical History:  Diagnosis Date   Allergy    Diabetes mellitus    Type 2 DM, Patient reports DM Type 1 diagnosd age at age 55   Diabetic foot ulcers ([place])    ESRD (end stage renal disease) ([place])    [facility]   Foot callus 07/01/2018   Foreign body in left foot    with  infection   Hypertension    Meniscus tear    Multiple rib fractures    Personal history of diabetic foot ulcer, Left Foot    Type 2 diabetes mellitus with stage 4 chronic kidney disease, without long-term current use of insulin ([place]) 06/01/2012    Past Surgical History:  Procedure Laterality Date   AV FISTULA PLACEMENT Left 11/06/2020   Procedure: LEFT ARM FIRST STAGE BRACHIAL BASILIC ARTERIOVENOUS (AV) FISTULA CREATION;  Surgeon: Marty Heck, MD;  Location: [facility];  Service: Vascular;  Laterality: Left;   AV FISTULA PLACEMENT Right 12/05/2020   Procedure: RADIOCEPHALIC ARTERIOVENOUS (AV) FISTULA CREATION RIGHT ARM;  Surgeon: Angelia Mould, MD;  Location: [facility];  Service: Vascular;  Laterality: Right;   EYE SURGERY Bilateral    lazer   I & D EXTREMITY  04/23/2012   Procedure: IRRIGATION AND DEBRIDEMENT EXTREMITY;  Surgeon: Newt Minion, MD;  Location: [facility];  Service: Orthopedics;  Laterality: Left;  Irrigation and Debridement Left Foot   I & D EXTREMITY  09/18/2012   Procedure: IRRIGATION  AND DEBRIDEMENT EXTREMITY;  Surgeon: Newt Minion, MD;  Location: [facility];  Service: Orthopedics;  Laterality: Left;   IR PERC TUN PERIT CATH WO PORT S&I /IMAG  08/30/2020   IR US GUIDE VASC ACCESS RIGHT  08/30/2020   LIGATION OF ARTERIOVENOUS  FISTULA Left 12/05/2020   Procedure: LIGATION OF ARTERIOVENOUS  FISTULA LEFT ARM;  Surgeon: Angelia Mould, MD;  Location: [facility];  Service: Vascular;  Laterality: Left;   MENISCUS REPAIR Left 04/2016   Orthopedist, Dr [name] in [place], [place]    There were no vitals filed for this visit.   Subjective Assessment - 03/13/21 0854     Subjective  Pt reports no pain today    Pertinent History Pt has dialysis M/W/F, pt is currently using a porta cath    Patient Stated Goals get hand working    Currently in Pain? No/denies                  Treatment: Fluidotherapy x 9 mins to LUE for stiffness, no adverse reactions. Copying small peg design with LUE for increased fine motor coordination, min difficulty, removing pegs with in hand manipulation mod difficulty, increased time required. Gripper set at level 1 to pick up 1/2 of the 1 inch blocks, min difficulty. Graded clothespins yellow-green for  sustained pinch, mod difficulty/ v.c                  OT Short Term Goals - 02/23/21 1343       OT SHORT TERM GOAL #1   Title I with initial HEP    Baseline dependent    Time 4    Period Weeks    Status Achieved    Target Date 02/20/21      OT SHORT TERM GOAL #2   Title Pt will verbalize understanding of sensory precautions for ADLS/IADLs.    Baseline dependent    Time 4    Period Weeks    Status Achieved      OT SHORT TERM GOAL #3   Title Pt will increase left grip strength by 3 lbs for increased LUE functional use during ADLS.    Baseline RUE 28.97 lbs  LUE 43    Time 4    Period Weeks    Status On-going               OT Long Term Goals - 02/23/21 1344       OT LONG TERM GOAL #1   Title Pt will increase  left 3 pt, tip  and lateral pinch by 2 lbs for increased functional use.    Baseline LUE 3 pt 2 lbs, lateral 2 lbs    Time 8    Period Weeks    Status On-going      OT LONG TERM GOAL #2   Title Pt will demonstrate ability to carry a lightweight object in right and left UE's simultaneously without drops.    Baseline frequently dropping items out of left hand    Time 8    Period Weeks    Status On-going      OT LONG TERM GOAL #3   Title Pt will demonstrate increased ease with opening plastic bags.    Baseline Pt reports significant difficulty opening plastic bags    Time 8    Period Weeks    Status On-going      OT LONG TERM GOAL #4   Title Pt will demonstrate ability to oppose 5th digit with thumb for increased functional use of LUE.    Baseline unable    Time 8    Period Weeks    Status On-going      OT LONG TERM GOAL #5   Title Pt will verbalize understanding of adapted strategies/ equipment to increase ease with ADLS/IADLs prn.    Baseline dependent    Time 8    Period Weeks    Status On-going                   Plan - 03/13/21 [id number]     Clinical Impression Statement Pt is progressing towards goals with improving functional use of LUE.    OT Occupational Profile and History Problem Focused Assessment - Including review of records relating to presenting problem    Occupational performance deficits (Please refer to evaluation for details): ADL's;IADL's;Work;Social Participation    Body Structure / Function / Physical Skills ADL;Edema;Strength;FMC;Coordination;Decreased knowledge of precautions;GMC;ROM;IADL;Dexterity;Sensation;Decreased knowledge of use of DME;Flexibility    Rehab Potential Good    Clinical Decision Making Limited treatment options, no task modification necessary    Comorbidities Affecting Occupational Performance: May have comorbidities impacting occupational performance    Modification or Assistance to Complete Evaluation  No modification of tasks  or assist necessary to complete eval    OT  Frequency --   1x week x 3 weeks followed by 2x week x 4 weeks plus eval or 12 visits total   OT Treatment/Interventions Self-care/ADL training;Therapeutic exercise;Patient/family education;Passive range of motion;Moist Heat;Fluidtherapy;Cryotherapy;Paraffin;DME and/or AE instruction;Ultrasound;Neuromuscular education;Manual Therapy;Splinting;Therapeutic activities    Plan LUE functional use and strength , can do fluidotherapy- keep in mind .impaired sensation    Consulted and Agree with Plan of Care Patient             Patient will benefit from skilled therapeutic intervention in order to improve the following deficits and impairments:   Body Structure / Function / Physical Skills: ADL,Edema,Strength,FMC,Coordination,Decreased knowledge of precautions,GMC,ROM,IADL,Dexterity,Sensation,Decreased knowledge of use of DME,Flexibility       Visit Diagnosis: Muscle weakness (generalized)  Other lack of coordination  Other disturbances of skin sensation    Problem List Patient Active Problem List   Diagnosis Date Noted   Elbow deformity, right 01/25/2021   Olecranon bursitis of both elbows 01/25/2021   End stage renal disease ([place]) 01/10/2021   Hyperlipidemia 05/28/2016   Hypertension 04/14/2012   DM (diabetes mellitus), type 2, uncontrolled ([place]) 04/13/2012    Kiyara Bouffard 03/13/2021, 9:25 AM  [facility] [address] [place], [place], [address] Phone: [phone]   Fax:  [phone]  Name: Slader Vasconez MRN: [id number] Date of Birth: 08-Jun-1966

## 2021-03-15 ENCOUNTER — Ambulatory Visit: Payer: 59 | Admitting: Occupational Therapy

## 2021-03-18 ENCOUNTER — Other Ambulatory Visit: Payer: Self-pay | Admitting: Family Medicine

## 2021-03-19 NOTE — Telephone Encounter (Signed)
Patient needs follow-up for blood pressure check in for future refills.

## 2021-03-20 ENCOUNTER — Ambulatory Visit: Payer: 59 | Admitting: Occupational Therapy

## 2021-03-20 ENCOUNTER — Other Ambulatory Visit: Payer: Self-pay

## 2021-03-20 DIAGNOSIS — M6281 Muscle weakness (generalized): Secondary | ICD-10-CM

## 2021-03-20 DIAGNOSIS — R278 Other lack of coordination: Secondary | ICD-10-CM

## 2021-03-20 DIAGNOSIS — R208 Other disturbances of skin sensation: Secondary | ICD-10-CM

## 2021-03-20 NOTE — Telephone Encounter (Signed)
Called and left voicemail for patient to call back and schedule appointment.

## 2021-03-20 NOTE — Therapy (Signed)
[facility] [address] [place], [place], [address] Phone: [phone]   Fax:  [phone]  Occupational Therapy Treatment  Patient Details  Name: [name] MRN: [id number] Date of Birth: 1966/04/13 Referring Provider (OT): 12/05/20   Encounter Date: 03/20/2021   OT End of Session - 03/20/21 0848     Visit Number 5    Number of Visits 12    Date for OT Re-Evaluation 03/27/21    Authorization Type [employer]/ Medicaid, covered 100% with Medicaid    Authorization Time Period 8 visits approved 03/15/21-05/27/21    Authorization - Visit Number 1    Authorization - Number of Visits 8    OT Start Time 0850    OT Stop Time 0930    OT Time Calculation (min) 40 min    Activity Tolerance Patient tolerated treatment well    Behavior During Therapy WFL for tasks assessed/performed             Past Medical History:  Diagnosis Date   Allergy    Diabetes mellitus    Type 2 DM, Patient reports DM Type 1 diagnosd age at age 55   Diabetic foot ulcers ([place])    ESRD (end stage renal disease) ([place])    [facility]   Foot callus 07/01/2018   Foreign body in left foot    with  infection   Hypertension    Meniscus tear    Multiple rib fractures    Personal history of diabetic foot ulcer, Left Foot    Type 2 diabetes mellitus with stage 4 chronic kidney disease, without long-term current use of insulin ([place]) 06/01/2012    Past Surgical History:  Procedure Laterality Date   AV FISTULA PLACEMENT Left 11/06/2020   Procedure: LEFT ARM FIRST STAGE BRACHIAL BASILIC ARTERIOVENOUS (AV) FISTULA CREATION;  Surgeon: Marty Heck, MD;  Location: [facility];  Service: Vascular;  Laterality: Left;   AV FISTULA PLACEMENT Right 12/05/2020   Procedure: RADIOCEPHALIC ARTERIOVENOUS (AV) FISTULA CREATION RIGHT ARM;  Surgeon: Angelia Mould, MD;  Location: [facility];  Service: Vascular;  Laterality: Right;   EYE SURGERY  Bilateral    lazer   I & D EXTREMITY  04/23/2012   Procedure: IRRIGATION AND DEBRIDEMENT EXTREMITY;  Surgeon: Newt Minion, MD;  Location: [facility];  Service: Orthopedics;  Laterality: Left;  Irrigation and Debridement Left Foot   I & D EXTREMITY  09/18/2012   Procedure: IRRIGATION AND DEBRIDEMENT EXTREMITY;  Surgeon: Newt Minion, MD;  Location: [facility];  Service: Orthopedics;  Laterality: Left;   IR PERC TUN PERIT CATH WO PORT S&I /IMAG  08/30/2020   IR US GUIDE VASC ACCESS RIGHT  08/30/2020   LIGATION OF ARTERIOVENOUS  FISTULA Left 12/05/2020   Procedure: LIGATION OF ARTERIOVENOUS  FISTULA LEFT ARM;  Surgeon: Angelia Mould, MD;  Location: [facility];  Service: Vascular;  Laterality: Left;   MENISCUS REPAIR Left 04/2016   Orthopedist, Dr [name] in [place], [place]    There were no vitals filed for this visit.   Subjective Assessment - 03/20/21 0847     Subjective  Pt reports no pain today    Pertinent History Pt has dialysis M/W/F, pt is currently using a porta cath    Patient Stated Goals get hand working              Fluidotherapy x 10 mins to LUE for stiffness, no adverse reactions, at decr temperature (115*)  Placing  grooved pegs in pegboard with min difficulty/incr time.  Dealing cards with thumb with min-mod difficulty  Flipping cards with min difficulty.  Graded clothespins yellow-green (for sustained pinch, mod difficulty/ v.c, shoulder soreness today.  Wrist flex/ext, UD/RD with 1lb x15 each  Manipulating 1-inch blocks block in hand to turn to designated #/side  Opposition toward base of 5th digit as able and to each finger (able to touch 5th digit inconsistently).  Rotating 2 golf balls in L hand for incr in-hand manipulation with max difficulty initially, then min difficulty with repetition.        OT Short Term Goals - 02/23/21 1343       OT SHORT TERM GOAL #1   Title I with initial HEP    Baseline dependent    Time 4    Period Weeks    Status  Achieved    Target Date 02/20/21      OT SHORT TERM GOAL #2   Title Pt will verbalize understanding of sensory precautions for ADLS/IADLs.    Baseline dependent    Time 4    Period Weeks    Status Achieved      OT SHORT TERM GOAL #3   Title Pt will increase left grip strength by 3 lbs for increased LUE functional use during ADLS.    Baseline RUE 28.97 lbs  LUE 43    Time 4    Period Weeks    Status On-going   Pt is progressing but he has not met goal, Pt is using red putty now. LUE grip 28.97 lbs              OT Long Term Goals - 02/23/21 1344       OT LONG TERM GOAL #1   Title Pt will increase left 3 pt, tip  and lateral pinch by 2 lbs for increased functional use.    Baseline LUE 3 pt 2 lbs, lateral 2 lbs    Time 8    Period Weeks    Status On-going   not met yet     OT LONG TERM GOAL #2   Title Pt will demonstrate ability to carry a lightweight object in right and left UE's simultaneously without drops.    Baseline frequently dropping items out of left hand    Time 8    Period Weeks    Status On-going   pt is progressing but still having drops     OT LONG TERM GOAL #3   Title Pt will demonstrate increased ease with opening plastic bags.    Baseline Pt reports significant difficulty opening plastic bags    Time 8    Period Weeks    Status On-going   improving but not yet met     OT LONG TERM GOAL #4   Title Pt will demonstrate ability to oppose 5th digit with thumb for increased functional use of LUE.    Baseline unable    Time 8    Period Weeks    Status On-going   unable to oppose 5th digit     OT LONG TERM GOAL #5   Title Pt will verbalize understanding of adapted strategies/ equipment to increase ease with ADLS/IADLs prn.    Baseline dependent    Time 8    Period Weeks    Status On-going   not yet addressed                  Plan - 03/20/21 7371  Clinical Impression Statement Pt is progressing towards goals with improving functional  use of LUE and coordination.  Pt able to inconsistently oppose to  5th digit.    OT Occupational Profile and History Problem Focused Assessment - Including review of records relating to presenting problem    Occupational performance deficits (Please refer to evaluation for details): ADL's;IADL's;Work;Social Participation    Body Structure / Function / Physical Skills ADL;Edema;Strength;FMC;Coordination;Decreased knowledge of precautions;GMC;ROM;IADL;Dexterity;Sensation;Decreased knowledge of use of DME;Flexibility    Rehab Potential Good    Clinical Decision Making Limited treatment options, no task modification necessary    Comorbidities Affecting Occupational Performance: May have comorbidities impacting occupational performance    Modification or Assistance to Complete Evaluation  No modification of tasks or assist necessary to complete eval    OT Frequency --   1x week x 3 weeks followed by 2x week x 4 weeks plus eval or 12 visits total   OT Treatment/Interventions Self-care/ADL training;Therapeutic exercise;Patient/family education;Passive range of motion;Moist Heat;Fluidtherapy;Cryotherapy;Paraffin;DME and/or AE instruction;Ultrasound;Neuromuscular education;Manual Therapy;Splinting;Therapeutic activities    Plan LUE functional use and strength, can do fluidotherapy (keep in mind impaired sensation)    Consulted and Agree with Plan of Care Patient             Patient will benefit from skilled therapeutic intervention in order to improve the following deficits and impairments:   Body Structure / Function / Physical Skills: ADL, Edema, Strength, FMC, Coordination, Decreased knowledge of precautions, GMC, ROM, IADL, Dexterity, Sensation, Decreased knowledge of use of DME, Flexibility       Visit Diagnosis: Muscle weakness (generalized)  Other lack of coordination  Other disturbances of skin sensation    Problem List Patient Active Problem List   Diagnosis Date Noted   Elbow  deformity, right 01/25/2021   Olecranon bursitis of both elbows 01/25/2021   End stage renal disease ([place]) 01/10/2021   Hyperlipidemia 05/28/2016   Hypertension 04/14/2012   DM (diabetes mellitus), type 2, uncontrolled ([place]) 04/13/2012    [facility] 03/20/2021, 9:41 AM  [facility] [address] [place], [place], [address] Phone: [phone]   Fax:  [phone]  Name: Ky Rumple MRN: [id number] Date of Birth: 11-17-1965  Vianne Bulls, OTR/L [facility] [address] [place], [place]  [phone] phone [phone] 03/20/21 9:41 AM

## 2021-03-21 ENCOUNTER — Other Ambulatory Visit: Payer: Self-pay | Admitting: *Deleted

## 2021-03-21 ENCOUNTER — Telehealth: Payer: Self-pay

## 2021-03-21 ENCOUNTER — Other Ambulatory Visit: Payer: Self-pay

## 2021-03-21 DIAGNOSIS — N185 Chronic kidney disease, stage 5: Secondary | ICD-10-CM
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
## 2021-03-22 ENCOUNTER — Ambulatory Visit (INDEPENDENT_AMBULATORY_CARE_PROVIDER_SITE_OTHER): Payer: 59 | Admitting: Vascular Surgery

## 2021-03-22 ENCOUNTER — Encounter: Payer: Self-pay | Admitting: Vascular Surgery

## 2021-03-22 ENCOUNTER — Other Ambulatory Visit: Payer: Self-pay

## 2021-03-22 ENCOUNTER — Other Ambulatory Visit (HOSPITAL_COMMUNITY): Payer: Self-pay | Admitting: Vascular Surgery

## 2021-03-22 ENCOUNTER — Ambulatory Visit: Payer: 59 | Admitting: Occupational Therapy

## 2021-03-22 ENCOUNTER — Ambulatory Visit (HOSPITAL_COMMUNITY)
Admission: RE | Admit: 2021-03-22 | Discharge: 2021-03-22 | Disposition: A | Discharge status: Home / Self Care | Payer: 59 | Source: Ambulatory Visit | Attending: Vascular Surgery | Admitting: Vascular Surgery

## 2021-03-22 ENCOUNTER — Ambulatory Visit (INDEPENDENT_AMBULATORY_CARE_PROVIDER_SITE_OTHER)
Admission: RE | Admit: 2021-03-22 | Discharge: 2021-03-22 | Disposition: A | Discharge status: Home / Self Care | Payer: 59 | Source: Ambulatory Visit | Attending: Vascular Surgery | Admitting: Vascular Surgery

## 2021-03-22 VITALS — BP 128/73 | HR 59 | Temp 98.4°F | Resp 20 | Ht 70.0 in | Wt 185.0 lb

## 2021-03-22 DIAGNOSIS — Z01818 Encounter for other preprocedural examination: Secondary | ICD-10-CM | POA: Insufficient documentation

## 2021-03-22 DIAGNOSIS — N185 Chronic kidney disease, stage 5: Secondary | ICD-10-CM | POA: Insufficient documentation

## 2021-03-22 DIAGNOSIS — N186 End stage renal disease: Secondary | ICD-10-CM | POA: Diagnosis not present

## 2021-03-22 DIAGNOSIS — Z992 Dependence on renal dialysis: Secondary | ICD-10-CM
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
## 2021-03-26 ENCOUNTER — Ambulatory Visit (HOSPITAL_COMMUNITY)
Admission: RE | Admit: 2021-03-26 | Discharge: 2021-03-26 | Disposition: A | Discharge status: Home / Self Care | Payer: 59 | Attending: Vascular Surgery | Admitting: Vascular Surgery

## 2021-03-26 ENCOUNTER — Other Ambulatory Visit: Payer: Self-pay

## 2021-03-26 ENCOUNTER — Encounter (HOSPITAL_COMMUNITY)
Admission: RE | Disposition: A | Discharge status: Home / Self Care | Payer: Self-pay | Source: Home / Self Care | Attending: Vascular Surgery

## 2021-03-26 DIAGNOSIS — E1022 Type 1 diabetes mellitus with diabetic chronic kidney disease: Secondary | ICD-10-CM | POA: Diagnosis not present

## 2021-03-26 DIAGNOSIS — T82898A Other specified complication of vascular prosthetic devices, implants and grafts, initial encounter: Secondary | ICD-10-CM | POA: Diagnosis not present

## 2021-03-26 DIAGNOSIS — T82510A Breakdown (mechanical) of surgically created arteriovenous fistula, initial encounter: Secondary | ICD-10-CM | POA: Insufficient documentation

## 2021-03-26 DIAGNOSIS — N186 End stage renal disease: Secondary | ICD-10-CM | POA: Diagnosis not present

## 2021-03-26 DIAGNOSIS — Y841 Kidney dialysis as the cause of abnormal reaction of the patient, or of later complication, without mention of misadventure at the time of the procedure: Secondary | ICD-10-CM | POA: Insufficient documentation

## 2021-03-26 DIAGNOSIS — Z79899 Other long term (current) drug therapy: Secondary | ICD-10-CM | POA: Diagnosis not present

## 2021-03-26 DIAGNOSIS — I120 Hypertensive chronic kidney disease with stage 5 chronic kidney disease or end stage renal disease: Secondary | ICD-10-CM | POA: Insufficient documentation

## 2021-03-26 DIAGNOSIS — Y832 Surgical operation with anastomosis, bypass or graft as the cause of abnormal reaction of the patient, or of later complication, without mention of misadventure at the time of the procedure: Secondary | ICD-10-CM | POA: Diagnosis not present

## 2021-03-26 DIAGNOSIS — Z992 Dependence on renal dialysis: Secondary | ICD-10-CM | POA: Diagnosis not present

## 2021-03-26 HISTORY — PX: A/V FISTULAGRAM: CATH118298

## 2021-03-26 LAB — POCT I-STAT, CHEM 8
BUN: 96 mg/dL — ABNORMAL HIGH (ref 6–20)
Calcium, Ion: 1.02 mmol/L — ABNORMAL LOW (ref 1.15–1.40)
Chloride: 109 mmol/L (ref 98–111)
Creatinine, Ser: 10.6 mg/dL — ABNORMAL HIGH (ref 0.61–1.24)
Glucose, Bld: 83 mg/dL (ref 70–99)
HCT: 37 % — ABNORMAL LOW (ref 39.0–52.0)
Hemoglobin: 12.6 g/dL — ABNORMAL LOW (ref 13.0–17.0)
Potassium: 5 mmol/L (ref 3.5–5.1)
Sodium: 142 mmol/L (ref 135–145)
TCO2: 21 mmol/L — ABNORMAL LOW (ref 22–32)

## 2021-03-26 SURGERY — A/V FISTULAGRAM
Anesthesia: LOCAL | Laterality: Right

## 2021-03-26 MED ORDER — LIDOCAINE HCL (PF) 1 % IJ SOLN
INTRAMUSCULAR | Status: AC
  Filled 2021-03-26: qty 30

## 2021-03-26 MED ORDER — LIDOCAINE HCL (PF) 1 % IJ SOLN
INTRAMUSCULAR | Status: DC | PRN
  Administered 2021-03-26: 2 mL

## 2021-03-26 MED ORDER — SODIUM CHLORIDE 0.9% FLUSH: 3.0000 mL | Freq: Two times a day (BID) | INTRAVENOUS | Status: DC

## 2021-03-26 MED ORDER — SODIUM CHLORIDE 0.9% FLUSH: 3.0000 mL | INTRAVENOUS | Status: DC | PRN

## 2021-03-26 MED ORDER — HEPARIN (PORCINE) IN NACL 1000-0.9 UT/500ML-% IV SOLN
INTRAVENOUS | Status: DC | PRN
  Administered 2021-03-26: 500 mL

## 2021-03-26 MED ORDER — IODIXANOL 320 MG/ML IV SOLN
INTRAVENOUS | Status: DC | PRN
  Administered 2021-03-26: 35 mL

## 2021-03-26 MED ORDER — HEPARIN (PORCINE) IN NACL 1000-0.9 UT/500ML-% IV SOLN
INTRAVENOUS | Status: AC
  Filled 2021-03-26: qty 500

## 2021-03-26 MED ORDER — SODIUM CHLORIDE 0.9 % IV SOLN: 250.0000 mL | INTRAVENOUS | Status: DC | PRN

## 2021-03-26 SURGICAL SUPPLY — 10 items
BAG SNAP BAND KOVER 36X36 (MISCELLANEOUS) ×2 IMPLANT
COVER DOME SNAP 22 D (MISCELLANEOUS) ×2 IMPLANT
KIT MICROPUNCTURE NIT STIFF (SHEATH) ×2 IMPLANT
PROTECTION STATION PRESSURIZED (MISCELLANEOUS) ×2
SHEATH PROBE COVER 6X72 (BAG) ×2 IMPLANT
STATION PROTECTION PRESSURIZED (MISCELLANEOUS) ×1 IMPLANT
STOPCOCK MORSE 400PSI 3WAY (MISCELLANEOUS) ×2 IMPLANT
TRAY PV CATH (CUSTOM PROCEDURE TRAY) ×2 IMPLANT
TUBING CIL FLEX 10 FLL-RA (TUBING) ×2 IMPLANT
WIRE TORQFLEX AUST .018X40CM (WIRE) ×2 IMPLANT

## 2021-03-26 NOTE — Interval H&P Note (Signed)
History and Physical Interval Note:  03/26/2021 12:43 PM  [name]  has presented today for surgery, with the diagnosis of end stage renal.  The various methods of treatment have been discussed with the patient and family. After consideration of risks, benefits and other options for treatment, the patient has consented to  Procedure(s): A/V FISTULAGRAM (Right) as a surgical intervention.  The patient's history has been reviewed, patient examined, no change in status, stable for surgery.  I have reviewed the patient's chart and labs.  Questions were answered to the patient's satisfaction.     Servando Snare

## 2021-03-26 NOTE — Op Note (Signed)
    Patient name: [name] MRN: [id number] DOB: 04/21/1966 Sex: male  03/26/2021 Pre-operative Diagnosis: End-stage renal disease, malfunction right arm AV fistula Post-operative diagnosis:  Same Surgeon:  Eda Paschal. Donzetta Matters, MD Procedure Performed: 1.  Ultrasound-guided cannulation right arm AV fistula 2.  Right upper extremity and central fistulogram   Indications: 55 year old male with recent placement of right radiocephalic AV fistula at the wrist.  Fistula was being used he is now dialyzing via catheter.  There is concern that the fistula is not suitable for cannulation he is indicated for fistulogram with possible invention.  Findings: Patient was somewhat diminutive near the anastomosis however throughout the course of the upper extremity the cephalic vein is patent it also gives rise to basilic vein and deep veins which are patent.  There is no stenosis throughout the fistula and the central veins are patent.  If patient has continued issues with the fistula in the forearm we can convert to an upper arm cephalic vein to brachial artery fistula.   Procedure:  The patient was identified in the holding area and taken to room 8.  The patient was then placed supine on the table and prepped and draped in the usual sterile fashion.  A time out was called.  Ultrasound was used to evaluate the right arm AV fistula near the wrist.  I also identified the anastomosis which appeared patent.  There is anesthetized 1% lidocaine cannulated micropuncture needle followed by wire and sheath with direct ultrasound visualization.  Images saved the permanent record.  We performed fistulogram of the right upper extremity including retrograde views.  No intervention was undertaken.  He tolerated seizure without any complication.  Contrast: 35cc    Ritika Hellickson C. Donzetta Matters, MD [facility] Office: [phone] Pager: [phone]

## 2021-03-27 ENCOUNTER — Ambulatory Visit: Payer: 59 | Admitting: Occupational Therapy

## 2021-03-27 ENCOUNTER — Encounter (HOSPITAL_COMMUNITY): Payer: Self-pay | Admitting: Vascular Surgery

## 2021-03-28 LAB — POCT I-STAT, CHEM 8
BUN: 117 mg/dL — ABNORMAL HIGH (ref 6–20)
Calcium, Ion: 0.99 mmol/L — ABNORMAL LOW (ref 1.15–1.40)
Chloride: 109 mmol/L (ref 98–111)
Creatinine, Ser: 10.4 mg/dL — ABNORMAL HIGH (ref 0.61–1.24)
Glucose, Bld: 82 mg/dL (ref 70–99)
HCT: 37 % — ABNORMAL LOW (ref 39.0–52.0)
Hemoglobin: 12.6 g/dL — ABNORMAL LOW (ref 13.0–17.0)
Potassium: 6.9 mmol/L (ref 3.5–5.1)
Sodium: 141 mmol/L (ref 135–145)
TCO2: 24 mmol/L (ref 22–32)

## 2021-03-29 ENCOUNTER — Ambulatory Visit: Payer: 59 | Admitting: Family Medicine

## 2021-03-29 ENCOUNTER — Other Ambulatory Visit: Payer: Self-pay

## 2021-03-29 ENCOUNTER — Ambulatory Visit: Payer: 59 | Admitting: Occupational Therapy

## 2021-03-29 DIAGNOSIS — R208 Other disturbances of skin sensation: Secondary | ICD-10-CM

## 2021-03-29 DIAGNOSIS — R278 Other lack of coordination: Secondary | ICD-10-CM

## 2021-03-29 DIAGNOSIS — M6281 Muscle weakness (generalized): Secondary | ICD-10-CM

## 2021-03-29 NOTE — Therapy (Signed)
[facility] [address] [place], [place], [address] Phone: [phone]   Fax:  [phone]  Occupational Therapy Treatment  Patient Details  Name: [name] MRN: [id number] Date of Birth: 12/04/1965 Referring Provider (OT): 12/05/20   Encounter Date: 03/29/2021   OT End of Session - 03/29/21 0852     Visit Number 6    Number of Visits 12    Date for OT Re-Evaluation 32/35/57   cert date ends   Authorization Type [employer]/ Medicaid, covered 100% with Medicaid    Authorization Time Period 8 visits approved 12/07/18-2/54/27, cert for POC ends 0/62/37    Authorization - Visit Number 1    Authorization - Number of Visits 8    OT Start Time 0847    OT Stop Time 0930    OT Time Calculation (min) 43 min    Activity Tolerance Patient tolerated treatment well    Behavior During Therapy [facility] for tasks assessed/performed             Past Medical History:  Diagnosis Date   Allergy    Diabetes mellitus    Type 2 DM, Patient reports DM Type 1 diagnosd age at age 30   Diabetic foot ulcers ([place])    ESRD (end stage renal disease) ([place])    [facility]   Foot callus 07/01/2018   Foreign body in left foot    with  infection   Hypertension    Meniscus tear    Multiple rib fractures    Personal history of diabetic foot ulcer, Left Foot    Type 2 diabetes mellitus with stage 4 chronic kidney disease, without long-term current use of insulin ([place]) 06/01/2012    Past Surgical History:  Procedure Laterality Date   A/V FISTULAGRAM Right 03/26/2021   Procedure: A/V FISTULAGRAM;  Surgeon: Waynetta Sandy, MD;  Location: [facility];  Service: Cardiovascular;  Laterality: Right;   AV FISTULA PLACEMENT Left 11/06/2020   Procedure: LEFT ARM FIRST STAGE BRACHIAL BASILIC ARTERIOVENOUS (AV) FISTULA CREATION;  Surgeon: Marty Heck, MD;  Location: [facility];  Service: Vascular;   Laterality: Left;   AV FISTULA PLACEMENT Right 12/05/2020   Procedure: RADIOCEPHALIC ARTERIOVENOUS (AV) FISTULA CREATION RIGHT ARM;  Surgeon: Angelia Mould, MD;  Location: [facility];  Service: Vascular;  Laterality: Right;   EYE SURGERY Bilateral    lazer   I & D EXTREMITY  04/23/2012   Procedure: IRRIGATION AND DEBRIDEMENT EXTREMITY;  Surgeon: Newt Minion, MD;  Location: [facility];  Service: Orthopedics;  Laterality: Left;  Irrigation and Debridement Left Foot   I & D EXTREMITY  09/18/2012   Procedure: IRRIGATION AND DEBRIDEMENT EXTREMITY;  Surgeon: Newt Minion, MD;  Location: [facility];  Service: Orthopedics;  Laterality: Left;   IR PERC TUN PERIT CATH WO PORT S&I /IMAG  08/30/2020   IR US GUIDE VASC ACCESS RIGHT  08/30/2020   LIGATION OF ARTERIOVENOUS  FISTULA Left 12/05/2020   Procedure: LIGATION OF ARTERIOVENOUS  FISTULA LEFT ARM;  Surgeon: Angelia Mould, MD;  Location[facility];  Service: Vascular;  Laterality: Left;   MENISCUS REPAIR Left 04/2016   Orthopedist, Dr [name] in [place], [place]    There were no vitals filed for this visit.   Subjective Assessment - 03/29/21 0853     Subjective  Denies pain    Pertinent History Pt has dialysis M/W/F, pt is currently using a porta cath  Patient Stated Goals get hand working    Currently in Pain? No/denies                  Treatment: Fluidotherapy x 9 mins to LUE for stiffness, no adverse reactions. Placing peg small pegs in pegboard with LUE for increased fine motor coordination, min difficulty, removing pegs with in hand manipulation mod difficulty, increased time required. A/ROM finger thumb opposition, and thumb flexion Graded clothespins yellow-green for sustained pinch, mod difficulty/ v.c                OT Education - 03/29/21 0928     Education Details upgraded putty to green for composite grasp    Person(s) Educated Patient    Methods Explanation;Demonstration;Verbal cues;Handout     Comprehension Verbalized understanding;Returned demonstration;Verbal cues required              OT Short Term Goals - 03/29/21 0901       OT SHORT TERM GOAL #1   Title I with initial HEP    Baseline dependent    Time 4    Period Weeks    Status Achieved    Target Date 02/20/21      OT SHORT TERM GOAL #2   Title Pt will verbalize understanding of sensory precautions for ADLS/IADLs.    Baseline dependent    Time 4    Period Weeks    Status Achieved      OT SHORT TERM GOAL #3   Title Pt will increase left grip strength by 3 lbs for increased LUE functional use during ADLS.    Baseline RUE 28.97 lbs  LUE 43    Time 4    Period Weeks    Status Achieved   37.6              OT Long Term Goals - 03/29/21 0857       OT LONG TERM GOAL #1   Title Pt will increase left 3 pt, tip  and lateral pinch by 2 lbs for increased functional use.    Baseline LUE 3 pt 2 lbs, lateral 2 lbs    Time 8    Period Weeks    Status On-going   not met yet     OT LONG TERM GOAL #2   Title Pt will demonstrate ability to carry a lightweight object in right and left UE's simultaneously without drops.    Baseline frequently dropping items out of left hand    Time 8    Period Weeks    Status On-going   pt is progressing but still having drops     OT LONG TERM GOAL #3   Title Pt will demonstrate increased ease with opening plastic bags.    Baseline Pt reports significant difficulty opening plastic bags    Time 8    Period Weeks    Status On-going   improving but not yet met     OT LONG TERM GOAL #4   Title Pt will demonstrate ability to oppose 5th digit with thumb for increased functional use of LUE.    Baseline unable    Time 8    Period Weeks    Status On-going   unable to oppose 5th digit     OT LONG TERM GOAL #5   Title Pt will verbalize understanding of adapted strategies/ equipment to increase ease with ADLS/IADLs prn.    Baseline dependent    Time 8    Period Weeks  Status On-going   not yet addressed                   Patient will benefit from skilled therapeutic intervention in order to improve the following deficits and impairments:           Visit Diagnosis: Muscle weakness (generalized)  Other lack of coordination  Other disturbances of skin sensation    Problem List Patient Active Problem List   Diagnosis Date Noted   Elbow deformity, right 01/25/2021   Olecranon bursitis of both elbows 01/25/2021   End stage renal disease ([place]) 01/10/2021   Hyperlipidemia 05/28/2016   Hypertension 04/14/2012   DM (diabetes mellitus), type 2, uncontrolled ([place]) 04/13/2012    Natsha Guidry 03/29/2021, 12:38 PM  [facility] [address] [place], [place], [address] Phone: [phone]   Fax:  [phone]  Name: Shellie Goettl MRN: [id number] Date of Birth: Apr 09, 1966

## 2021-04-03 ENCOUNTER — Ambulatory Visit: Payer: 59 | Admitting: Occupational Therapy

## 2021-04-05 ENCOUNTER — Encounter: Payer: 59 | Admitting: Occupational Therapy

## 2021-04-13 ENCOUNTER — Other Ambulatory Visit (HOSPITAL_COMMUNITY): Payer: Self-pay | Admitting: Medical

## 2021-04-13 DIAGNOSIS — N186 End stage renal disease: Secondary | ICD-10-CM

## 2021-04-16 ENCOUNTER — Other Ambulatory Visit: Payer: Self-pay | Admitting: Student

## 2021-04-17 ENCOUNTER — Other Ambulatory Visit: Payer: Self-pay

## 2021-04-17 ENCOUNTER — Ambulatory Visit: Payer: 59 | Admitting: Occupational Therapy

## 2021-04-17 ENCOUNTER — Ambulatory Visit (HOSPITAL_COMMUNITY)
Admission: RE | Admit: 2021-04-17 | Discharge: 2021-04-17 | Disposition: A | Discharge status: Home / Self Care | Payer: 59 | Source: Ambulatory Visit | Attending: Medical | Admitting: Medical

## 2021-04-17 DIAGNOSIS — Z4901 Encounter for fitting and adjustment of extracorporeal dialysis catheter: Secondary | ICD-10-CM | POA: Diagnosis present

## 2021-04-17 DIAGNOSIS — N186 End stage renal disease: Secondary | ICD-10-CM | POA: Diagnosis not present

## 2021-04-17 HISTORY — PX: IR REMOVAL TUN CV CATH W/O FL: IMG2289

## 2021-04-17 MED ORDER — LIDOCAINE HCL 1 % IJ SOLN
INTRAMUSCULAR | Status: AC
  Filled 2021-04-17: qty 20

## 2021-04-17 NOTE — Procedures (Signed)
Pt's rt IJ tunneled HD cath was removed in its entirety without immediate complications. Gauze dressing applied to site. Medication used-1% lidocaine to skin/SQ tissue. EBL< 5 cc.

## 2021-04-18 ENCOUNTER — Ambulatory Visit: Payer: 59 | Admitting: Podiatry

## 2021-04-24 ENCOUNTER — Ambulatory Visit: Payer: 59 | Admitting: Occupational Therapy

## 2021-04-25 ENCOUNTER — Other Ambulatory Visit: Payer: Self-pay | Admitting: Family Medicine

## 2021-05-03 ENCOUNTER — Ambulatory Visit: Payer: 59 | Attending: Family Medicine | Admitting: Occupational Therapy

## 2021-06-13 ENCOUNTER — Telehealth: Payer: Self-pay

## 2021-06-13 NOTE — Telephone Encounter (Signed)
Patient is s/p RC AVF in the right arm and s/p ligation of left arm AVF in March 2022. He was having issues previously and a fistulogram was performed in June. Access was small but patent, notes say if patient having further issues - can convert to [facility] fistula. Ramiro Harvest HD PA calls today to report that patient is able to do dialysis, but has numbness in his hand during treatment. It immediately resolves after. Discussed with MD - could potentially place patient on surgery schedule - however, patient would like to be seen first and discuss. His HD days are MWF. Patient aware of scheduled appt.

## 2021-06-19 ENCOUNTER — Encounter: Payer: Self-pay | Admitting: Family Medicine

## 2021-06-19 ENCOUNTER — Ambulatory Visit (INDEPENDENT_AMBULATORY_CARE_PROVIDER_SITE_OTHER): Payer: 59 | Admitting: Family Medicine

## 2021-06-19 ENCOUNTER — Other Ambulatory Visit: Payer: Self-pay

## 2021-06-19 VITALS — BP 102/58 | HR 68 | Ht 70.0 in | Wt 191.0 lb

## 2021-06-19 DIAGNOSIS — H6121 Impacted cerumen, right ear: Secondary | ICD-10-CM | POA: Insufficient documentation

## 2021-06-19 DIAGNOSIS — E1165 Type 2 diabetes mellitus with hyperglycemia: Secondary | ICD-10-CM

## 2021-06-19 LAB — POCT GLYCOSYLATED HEMOGLOBIN (HGB A1C): HbA1c, POC (controlled diabetic range): 6 % (ref 0.0–7.0)

## 2021-06-19 MED ORDER — CARVEDILOL 12.5 MG PO TABS: 12.5000 mg | ORAL_TABLET | Freq: Two times a day (BID) | ORAL | 3 refills | Status: DC

## 2021-06-19 MED ORDER — AMLODIPINE BESYLATE 10 MG PO TABS: 10.0000 mg | ORAL_TABLET | Freq: Every day | ORAL | 3 refills | Status: DC

## 2021-06-19 NOTE — Patient Instructions (Addendum)
It was great seeing you today.  We did do diabetic foot exam and checked your hemoglobin A1c.  Your hemoglobin A1c was 6.0 today but it is difficult to know if that means anything because you are a dialysis patient.  Your neuropathy in your feet is considerable so I be sure to evaluate your toes every day.  We also cleaned her ear out because it was full of wax.  Please discontinue use of Q-tips.  Let warm water run anterior in the shower to help it drain.  If you have any questions or concerns call the clinic.  Happy of a wonderful day!

## 2021-06-19 NOTE — Assessment & Plan Note (Signed)
Hemoglobin A1c 6.0 today from 5.7 at last check.  No hypoglycemic events.  Not on any current medications.  We will continue to monitor and can recheck A1c in approximately 6 months.

## 2021-06-19 NOTE — Progress Notes (Signed)
    SUBJECTIVE:   CHIEF COMPLAINT / HPI:   Check up Patient here for checkup.  Feels like blood pressure is doing well and is compliant with his medications.  Does need refills on certain blood pressure medications.  Ear concerns Patient feels that he cannot hear out of his right ear.  This has been a chronic issue for several weeks.  He has been using Q-tips to try and get the wax out but has been unsuccessful.  Denies any pain in the ear.  Denies any trauma to the ear other than using the Q-tips.  OBJECTIVE:   BP (!) 102/58   Pulse 68   Ht '5\' 10"'$  (1.778 m)   Wt 191 lb (86.6 kg)   SpO2 99%   BMI 27.41 kg/m   General: Well-appearing 55 year old male, no acute distress HEENT: Right external auditory canal completely occluded with cerumen. Cardiac: Regular rate and rhythm, no murmurs appreciated Respiratory: Normal for breathing, lungs clear to auscultation bilaterally Abdomen: Soft, nontender, positive bowel sounds MSK: No gross abnormalities  ASSESSMENT/PLAN:   DM (diabetes mellitus), type 2, uncontrolled (HCC) Hemoglobin A1c 6.0 today from 5.7 at last check.  No hypoglycemic events.  Not on any current medications.  We will continue to monitor and can recheck A1c in approximately 6 months.  Impacted cerumen of right ear Impacted cerumen visualized in right ear.  Cleared with repetitive irrigation and ear curette.  Patient reports great increase in hearing after cerumen cleared.     Gifford Shave, MD [facility]

## 2021-06-19 NOTE — Assessment & Plan Note (Signed)
Impacted cerumen visualized in right ear.  Cleared with repetitive irrigation and ear curette.  Patient reports great increase in hearing after cerumen cleared.

## 2021-07-03 ENCOUNTER — Ambulatory Visit: Payer: 59 | Admitting: Vascular Surgery

## 2021-07-24 ENCOUNTER — Other Ambulatory Visit: Payer: Self-pay

## 2021-07-24 ENCOUNTER — Ambulatory Visit (INDEPENDENT_AMBULATORY_CARE_PROVIDER_SITE_OTHER): Payer: 59 | Admitting: Vascular Surgery

## 2021-07-24 ENCOUNTER — Encounter: Payer: Self-pay | Admitting: Vascular Surgery

## 2021-07-24 VITALS — BP 104/65 | HR 65 | Temp 97.8°F | Resp 18 | Ht 70.0 in | Wt 195.0 lb

## 2021-07-24 DIAGNOSIS — N186 End stage renal disease: Secondary | ICD-10-CM

## 2021-07-24 NOTE — Progress Notes (Signed)
Patient name: [name] MRN: [id number] DOB: [birth year] Sex: male  REASON FOR VISIT: Evaluate for steal syndrome right hand  HPI: [name] is a 55 y.o. male with end-stage renal disease on dialysis with a right radiocephalic AV fistula.  He presents for evaluation steal syndrome right hand.  Most recently had ligation of left basilic vein fistula with a new right radiocephalic on 5/0/5697 by Dr. Scot Dock.  This was for left hand steal.  He states about a month ago he started having numbness in his right fourth and fifth fingers during dialysis about 3 hours into the session.  He has no tissue loss.  This only happened when he was dialyzing.  Over the last month that is completely resolved and he is having no symptoms at this time.  States his fistula is working well.  He also complains about some cramping in the left foot at night but not when he is walking.  Past Medical History:  Diagnosis Date   Allergy    Diabetes mellitus    Type 2 DM, Patient reports DM Type 1 diagnosd age at age 64   Diabetic foot ulcers ([place])    ESRD (end stage renal disease) ([place])    [facility]   Foot callus 07/01/2018   Foreign body in left foot    with  infection   Hypertension    Meniscus tear    Multiple rib fractures    Personal history of diabetic foot ulcer, Left Foot    Type 2 diabetes mellitus with stage 4 chronic kidney disease, without long-term current use of insulin ([place]) 06/01/2012    Past Surgical History:  Procedure Laterality Date   A/V FISTULAGRAM Right 03/26/2021   Procedure: A/V FISTULAGRAM;  Surgeon: Waynetta Sandy, MD;  Location: [facility];  Service: Cardiovascular;  Laterality: Right;   AV FISTULA PLACEMENT Left 11/06/2020   Procedure: LEFT ARM FIRST STAGE BRACHIAL BASILIC ARTERIOVENOUS (AV) FISTULA CREATION;  Surgeon: Marty Heck, MD;  Location: [facility];  Service: Vascular;  Laterality: Left;   AV FISTULA PLACEMENT Right 12/05/2020    Procedure: RADIOCEPHALIC ARTERIOVENOUS (AV) FISTULA CREATION RIGHT ARM;  Surgeon: Angelia Mould, MD;  Location: [facility];  Service: Vascular;  Laterality: Right;   EYE SURGERY Bilateral    lazer   I & D EXTREMITY  04/23/2012   Procedure: IRRIGATION AND DEBRIDEMENT EXTREMITY;  Surgeon: Newt Minion, MD;  Location: [facility];  Service: Orthopedics;  Laterality: Left;  Irrigation and Debridement Left Foot   I & D EXTREMITY  09/18/2012   Procedure: IRRIGATION AND DEBRIDEMENT EXTREMITY;  Surgeon: Newt Minion, MD;  Location: [facility];  Service: Orthopedics;  Laterality: Left;   IR PERC TUN PERIT CATH WO PORT S&I /IMAG  08/30/2020   IR REMOVAL TUN CV CATH W/O FL  04/17/2021   IR US GUIDE VASC ACCESS RIGHT  08/30/2020   LIGATION OF ARTERIOVENOUS  FISTULA Left 12/05/2020   Procedure: LIGATION OF ARTERIOVENOUS  FISTULA LEFT ARM;  Surgeon: Angelia Mould, MD;  [facility];  Service: Vascular;  Laterality: Left;   MENISCUS REPAIR Left 04/2016   Orthopedist, Dr [name] in [place], [place]    Family History  Problem Relation Age of Onset   Diabetes type II Mother    Arthritis Mother    Diabetes Mother    Hyperlipidemia Mother    Hypertension Mother    Diabetes type II Sister    Diabetes Father  SOCIAL HISTORY: Social History   Tobacco Use   Smoking status: Former    Types: Cigarettes    Quit date: 09/03/2012    Years since quitting: 8.8   Smokeless tobacco: Former    Quit date: 09/03/2012  Substance Use Topics   Alcohol use: Not Currently    Allergies  Allergen Reactions   Latex Rash and Other (See Comments)    Pt states he is allergic to latex condoms   Ace Inhibitors Cough   Wound Dressing Adhesive Itching and Rash    Allergy to tape adhesive    Current Outpatient Medications  Medication Sig Dispense Refill   acetaminophen (TYLENOL) 325 MG tablet Take 650 mg by mouth every 6 (six) hours as needed for mild pain or headache.      amLODipine (NORVASC) 10 MG tablet  Take 1 tablet (10 mg total) by mouth at bedtime. 90 tablet 3   Blood Glucose Monitoring Suppl (BLOOD GLUCOSE MONITOR SYSTEM) w/Device KIT 1 kit by Does not apply route daily. 1 kit 0   calcium acetate (PHOSLO) 667 MG capsule Take 2,001 mg by mouth 3 (three) times daily.     lidocaine-prilocaine (EMLA) cream Apply 1 application topically daily as needed (prior to port being accessed for dialysis).     carvedilol (COREG) 12.5 MG tablet Take 1 tablet (12.5 mg total) by mouth 2 (two) times daily with a meal. 180 tablet 3   No current facility-administered medications for this visit.    REVIEW OF SYSTEMS:  _0  denotes positive finding, _1  denotes negative finding Cardiac  Comments:  Chest pain or chest pressure:    Shortness of breath upon exertion:    Short of breath when lying flat:    Irregular heart rhythm:        Vascular    Pain in calf, thigh, or hip brought on by ambulation:    Pain in feet at night that wakes you up from your sleep:     Blood clot in your veins:    Leg swelling:         Pulmonary    Oxygen at home:    Productive cough:     Wheezing:         Neurologic    Sudden weakness in arms or legs:     Sudden numbness in arms or legs:     Sudden onset of difficulty speaking or slurred speech:    Temporary loss of vision in one eye:     Problems with dizziness:         Gastrointestinal    Blood in stool:     Vomited blood:         Genitourinary    Burning when urinating:     Blood in urine:        Psychiatric    Major depression:         Hematologic    Bleeding problems:    Problems with blood clotting too easily:        Skin    Rashes or ulcers:        Constitutional    Fever or chills:      PHYSICAL EXAM: Vitals:   07/24/21 1140  BP: 104/65  Pulse: 65  Resp: 18  Temp: 97.8 F (36.6 C)  TempSrc: Temporal  SpO2: 95%  Weight: 195 lb (88.5 kg)  Height: _2  (1.778 m)    GENERAL: The patient is a well-nourished male, in no acute distress.  The vital signs are documented above. CARDIAC: There is a regular rate and rhythm.  VASCULAR:  Right radiocephalic fistula with appreciable thrill Right radial pulse 1+ palpable at the wrist No right hand tissue loss Palpable femoral pulses bilaterally Right DP palpable Left DP/PT brisk by doppler PULMONARY: No respiratory distress ABDOMEN: Soft and non-tender MUSCULOSKELETAL: There are no major deformities or cyanosis. NEUROLOGIC: No focal weakness or paresthesias are detected. SKIN: There are no ulcers or rashes noted. PSYCHIATRIC: The patient has a normal affect.  DATA:   None  Assessment/Plan:  55 year old male with end-stage renal disease that presents for evaluation of right upper extremity steal syndrome.  This is in the setting of previously ligated left basilic vein fistula for steal syndrome now with a right radiocephalic fistula.  The symptoms he describes about a month ago could certainly be related to steal.  Fortunately his symptoms have completely resolved and he is having no issues over the last month.  I see no indication for additional intervention at this time.  He states the fistula is working well.  He can follow-up with me as needed.   Marty Heck, MD [facility] Office: [phone]

## 2021-08-29 ENCOUNTER — Emergency Department (HOSPITAL_COMMUNITY): Admission: EM | Admit: 2021-08-29 | Discharge: 2021-08-29 | Discharge status: Against Medical Advice | Payer: 59

## 2021-08-29 NOTE — ED Notes (Signed)
Patient seen leaving lobby and stating he is not staying

## 2021-09-03 ENCOUNTER — Telehealth: Payer: Self-pay

## 2021-09-03 NOTE — Telephone Encounter (Signed)
Patient calls nurse line reporting positive covid test. Patient reports symptoms started on 11/23 with fever, cough and diarrhea. Patient reports he went to the ED, however was so "packed" he left without being seen. Patient reports he was able to take a home test that evening and positive.   Patient is requesting antivirals. Patient advised of time frame.   Will forward to PCP.

## 2021-09-04 NOTE — Telephone Encounter (Signed)
Discussed recommendation for paxlovid with patient within 5 days of symptoms onset. Reviewed patient's current symptoms and medical hx (patient reports being on HD).Patient states he is feeling improved, denies SOB, reports mild cough, normal oral intake, no lightheaded or dizziness, no chest tightness or difficulty breathing. Did not recommend paxlovid therapy at this time as patient recovering however did discuss ED precautions with the patient and he voiced understanding.   Earl Foster, MD [place], PGY-3 [phone]

## 2021-09-12 NOTE — Telephone Encounter (Signed)
Patient's wife returns call to nurse line regarding request for COVID home testing.   Patient's insurance will cover if rx is sent to pharmacy.   Please advise if home tests can be sent to the pharmacy.   Talbot Grumbling, RN

## 2021-09-13 MED ORDER — COVID-19 AT-HOME TEST VI KIT: 1.0000 | PACK | 3 refills | Status: DC | PRN

## 2021-09-13 NOTE — Addendum Note (Signed)
Addended by: Caralee Ates on: 09/13/2021 02:03 PM   Modules accepted: Orders

## 2021-09-13 NOTE — Telephone Encounter (Signed)
Dr. Janus Molder covering for Dr. Posey Pronto,   [place] test to sent to pharmacy.   Gerlene Fee, DO 09/13/2021, 2:03 PM PGY-3, [place]

## 2021-10-17 NOTE — Progress Notes (Addendum)
° ° °  SUBJECTIVE:   Chief compliant/HPI: annual examination  [name] is a 56 y.o. who presents today for an annual exam.    Diabetes Diet controlled diabetes.  Denies abdominal pain, blurred vision, polyuria, polydipsia, hypoglycemia  Patient states they understand that diet and exercise can help with her diabetes. A1c today:  Lab Results  Component Value Date   HGBA1C 5.5 10/18/2021   HGBA1C 6.0 06/19/2021   HGBA1C 5.6 01/25/2021    Last Microalbumin, LDL, Creatinine: Lab Results  Component Value Date   MICROALBUR 150 03/07/2020   LDLCALC 122 (H) 10/18/2021   CREATININE 10.60 (H) 03/26/2021     Left 3rd finger swelling Swollen for the last month and tender to touch. Denies recent injury. 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 Unable to fully flex the 3rd finger. Denies erythema, fevers, warm joint etc. No other joints affected. No hx of rheumatological disorders.  History tabs reviewed and updated.   Review of systems form reviewed and negative.  OBJECTIVE:   BP 132/70    Pulse 80    Ht 5\' 10"  (1.778 m)    Wt 201 lb 2 oz (91.2 kg)    SpO2 98%    BMI 28.86 kg/m    General: Alert, no acute distress, well appearing Cardio: Normal S1 and S2, RRR, no r/m/g Pulm: CTAB, normal work of breathing Abdomen: Bowel sounds normal. Abdomen soft and non-tender.  Extremities: No peripheral edema.  Neuro: Cranial nerves grossly intact   Hand: Inspection: No obvious deformity. Swelling of left 3rd digit. No erythema or bruising Palpation: TTP left third digit ROM: Full ROM of the digits and wrist. Unable to fully extend and flex 3rd digit finger. Strength: 5/5 strength in the forearm, wrist and interosseus muscles Neurovascular: NV intact    ASSESSMENT/PLAN:   DM (diabetes mellitus), type 2, uncontrolled (HCC) A1c 5.5. Not on any diabetic medications. It is likely to be falsely low in ESRD. Glucose on BMPs over the last year have been normal indicating  adequate control. Continue to monitor BMP and A1c in 3 months.  Finger joint swelling, left Swelling of entire left third finger. No swan neck or boutonniere deformities. Llikely secondary to overuse injury or synovitis. Could also be OA given occupation. Fracture unlikely. Recommended rest, ice, compression and tylenol for pain. NSAIDs contraindicated given ESRD. Also recommended hand xray for further evaluation. Follow up as needed.  Hypertension BP at goal. Continue Amlodipine 10mg  daily and Carvedilol 12.5mg  BID.    Annual Examination  See AVS for age appropriate recommendations.  PHQ score 0, reviewed and discussed.  Blood pressure value is 132/70 goal, discussed.   Considered the following screening exams based upon USPSTF recommendations: Diabetes screening: discussed Screening for elevated cholesterol: ordered HIV testing: ordered Hepatitis C: ordered Hepatitis B: ordered Syphilis if at high risk: not indicated  Reviewed risk factors for latent tuberculosis and not indicated Colorectal cancer screening: discussed and declined today.  Lung cancer screening: done See documentation below regarding discussion and indication.  PSA discussed and after engaging in discussion of possible risks, benefits and complications of screening patient elected to get PSA checked.   Follow up in 1 year or sooner if indicated.   Lattie Haw, MD [place]

## 2021-10-18 ENCOUNTER — Encounter: Payer: Self-pay | Admitting: Family Medicine

## 2021-10-18 ENCOUNTER — Ambulatory Visit
Admission: RE | Admit: 2021-10-18 | Discharge: 2021-10-18 | Disposition: A | Discharge status: Home / Self Care | Payer: Managed Care, Other (non HMO) | Source: Ambulatory Visit | Attending: Family Medicine | Admitting: Family Medicine

## 2021-10-18 ENCOUNTER — Ambulatory Visit (INDEPENDENT_AMBULATORY_CARE_PROVIDER_SITE_OTHER): Payer: Managed Care, Other (non HMO) | Admitting: Family Medicine

## 2021-10-18 ENCOUNTER — Other Ambulatory Visit: Payer: Self-pay

## 2021-10-18 VITALS — BP 132/70 | HR 80 | Ht 70.0 in | Wt 201.1 lb

## 2021-10-18 DIAGNOSIS — Z0000 Encounter for general adult medical examination without abnormal findings: Secondary | ICD-10-CM | POA: Diagnosis not present

## 2021-10-18 DIAGNOSIS — M25442 Effusion, left hand: Secondary | ICD-10-CM

## 2021-10-18 DIAGNOSIS — Z1159 Encounter for screening for other viral diseases: Secondary | ICD-10-CM | POA: Diagnosis not present

## 2021-10-18 DIAGNOSIS — Z114 Encounter for screening for human immunodeficiency virus [HIV]: Secondary | ICD-10-CM

## 2021-10-18 DIAGNOSIS — I150 Renovascular hypertension: Secondary | ICD-10-CM

## 2021-10-18 DIAGNOSIS — R7309 Other abnormal glucose: Secondary | ICD-10-CM

## 2021-10-18 LAB — POCT GLYCOSYLATED HEMOGLOBIN (HGB A1C): Hemoglobin A1C: 5.5 % (ref 4.0–5.6)

## 2021-10-18 NOTE — Patient Instructions (Addendum)
Thank you for coming to see me today. It was a pleasure. Today we discussed your finger swelling. It could be arthritis or overuse injury. I recommend tylenol, ice and rest.   Please get an xray of your hand. I Please go to [facility] at [employer] or at [facility] to have this completed.  You do not need an appointment, but if you would like to call them beforehand, their number is [phone].  We will contact you with your results afterwards.   We will get some labs today.  If they are abnormal or we need to do something about them, I will call you.  If they are normal, I will send you a message on MyChart (if it is active) or a letter in the mail.  If you don't hear from [place] in 2 weeks, please call the office at the number below.   Please follow-up with your PCP for the results.  If you have any questions or concerns, please do not hesitate to call the office at [phone].  Best wishes,   Dr Posey Pronto

## 2021-10-19 LAB — LIPID PANEL
Chol/HDL Ratio: 5.3 ratio — ABNORMAL HIGH (ref 0.0–5.0)
Cholesterol, Total: 179 mg/dL (ref 100–199)
HDL: 34 mg/dL — ABNORMAL LOW (ref >39)
LDL Chol Calc (NIH): 122 mg/dL — ABNORMAL HIGH (ref 0–99)
Triglycerides: 127 mg/dL (ref 0–149)
VLDL Cholesterol Cal: 23 mg/dL (ref 5–40)

## 2021-10-19 LAB — HEPATITIS B SURFACE ANTIBODY,QUALITATIVE: Hep B Surface Ab, Qual: REACTIVE

## 2021-10-19 LAB — HEPATITIS C ANTIBODY: Hep C Virus Ab: <0.1 s/co ratio (ref 0.0–0.9)

## 2021-10-19 LAB — HIV ANTIBODY (ROUTINE TESTING W REFLEX): HIV Screen 4th Generation wRfx: NONREACTIVE

## 2021-10-19 LAB — PSA: Prostate Specific Ag, Serum: 0.2 ng/mL (ref 0.0–4.0)

## 2021-10-21 DIAGNOSIS — M25442 Effusion, left hand: Secondary | ICD-10-CM | POA: Insufficient documentation

## 2021-10-21 NOTE — Assessment & Plan Note (Addendum)
Swelling of entire left third finger. No swan neck or boutonniere deformities. Llikely secondary to overuse injury or synovitis. Could also be OA given occupation. Fracture unlikely. Recommended rest, ice, compression and tylenol for pain. NSAIDs contraindicated given ESRD. Also recommended hand xray for further evaluation. Follow up as needed.

## 2021-10-21 NOTE — Assessment & Plan Note (Addendum)
BP at goal. Continue Amlodipine 10mg  daily and Carvedilol 12.5mg  BID.

## 2021-10-21 NOTE — Assessment & Plan Note (Addendum)
A1c 5.5. Not on any diabetic medications. It is likely to be falsely low in ESRD. Glucose on BMPs over the last year have been normal indicating adequate control. Continue to monitor BMP and A1c in 3 months.

## 2021-11-18 IMAGING — DX DG ELBOW 2V*R*
2 series · 2 of 2 positions shown · non-contrast
Comparison: None.

CLINICAL DATA: Posterior right elbow swelling

EXAM:
RIGHT ELBOW - 2 VIEW

[dg elbow 2 views right (1 of 2)]
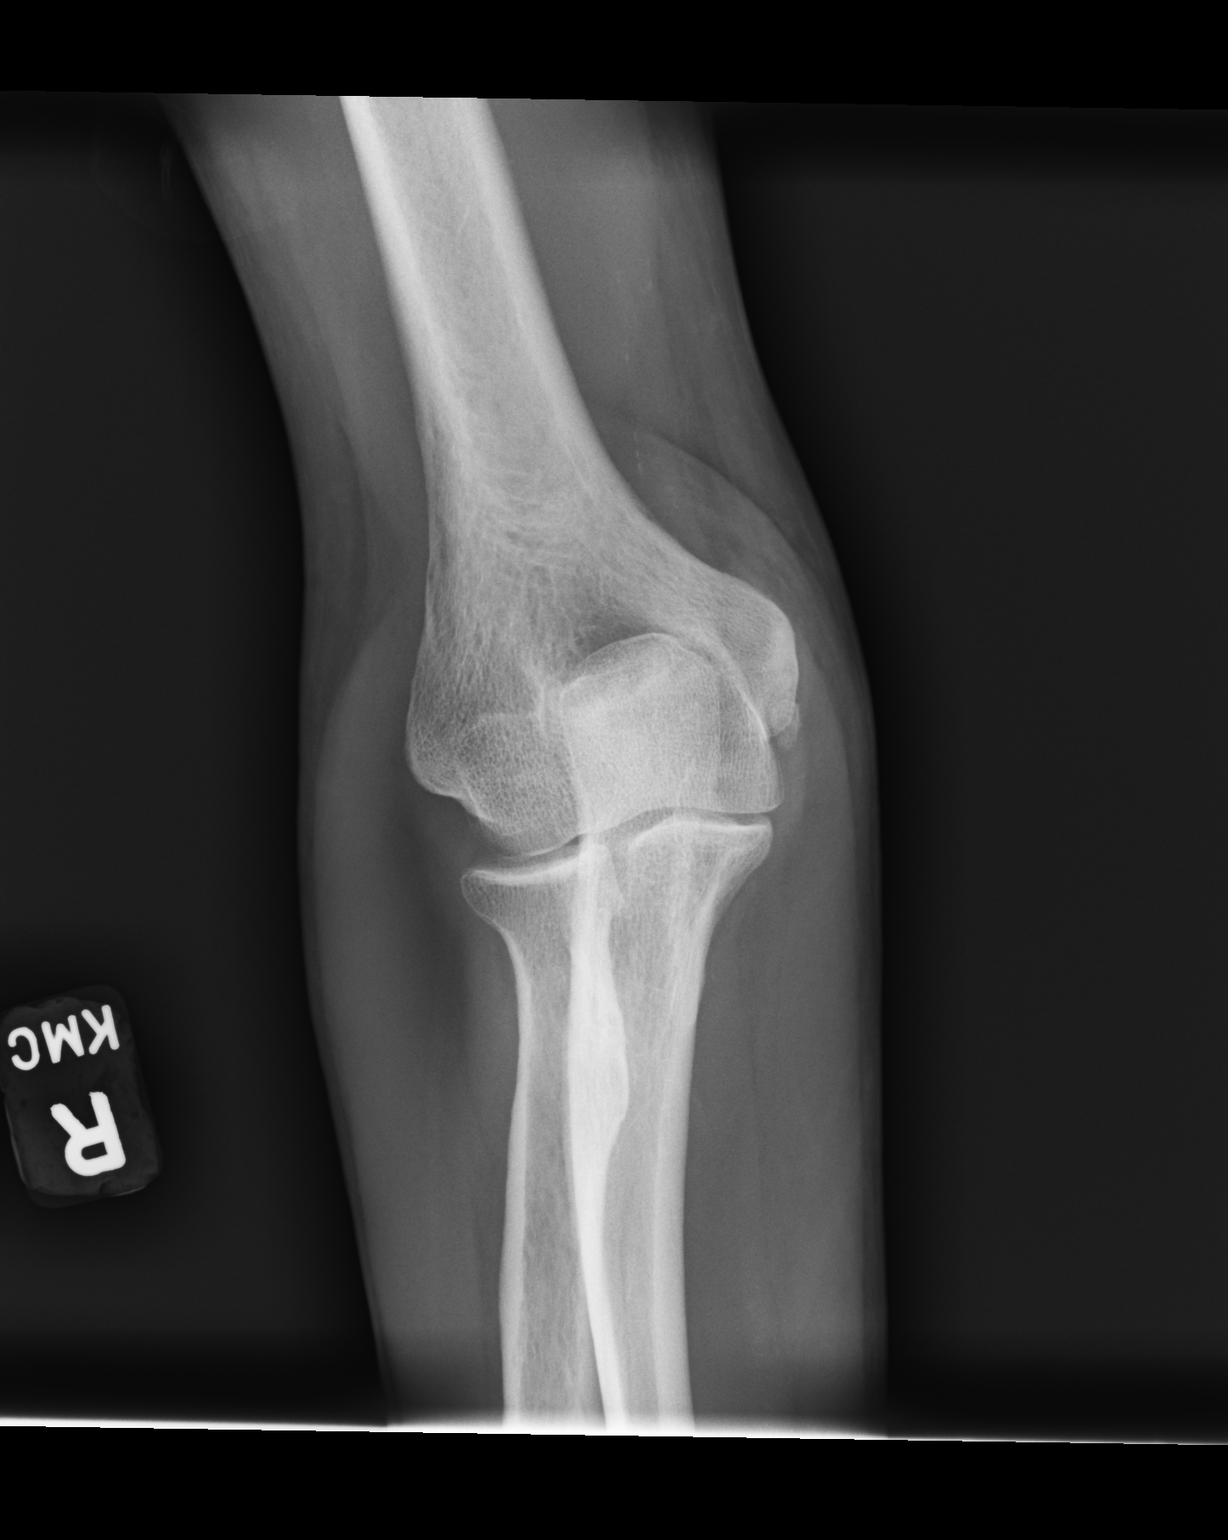

[dg elbow 2 views right (2 of 2)]
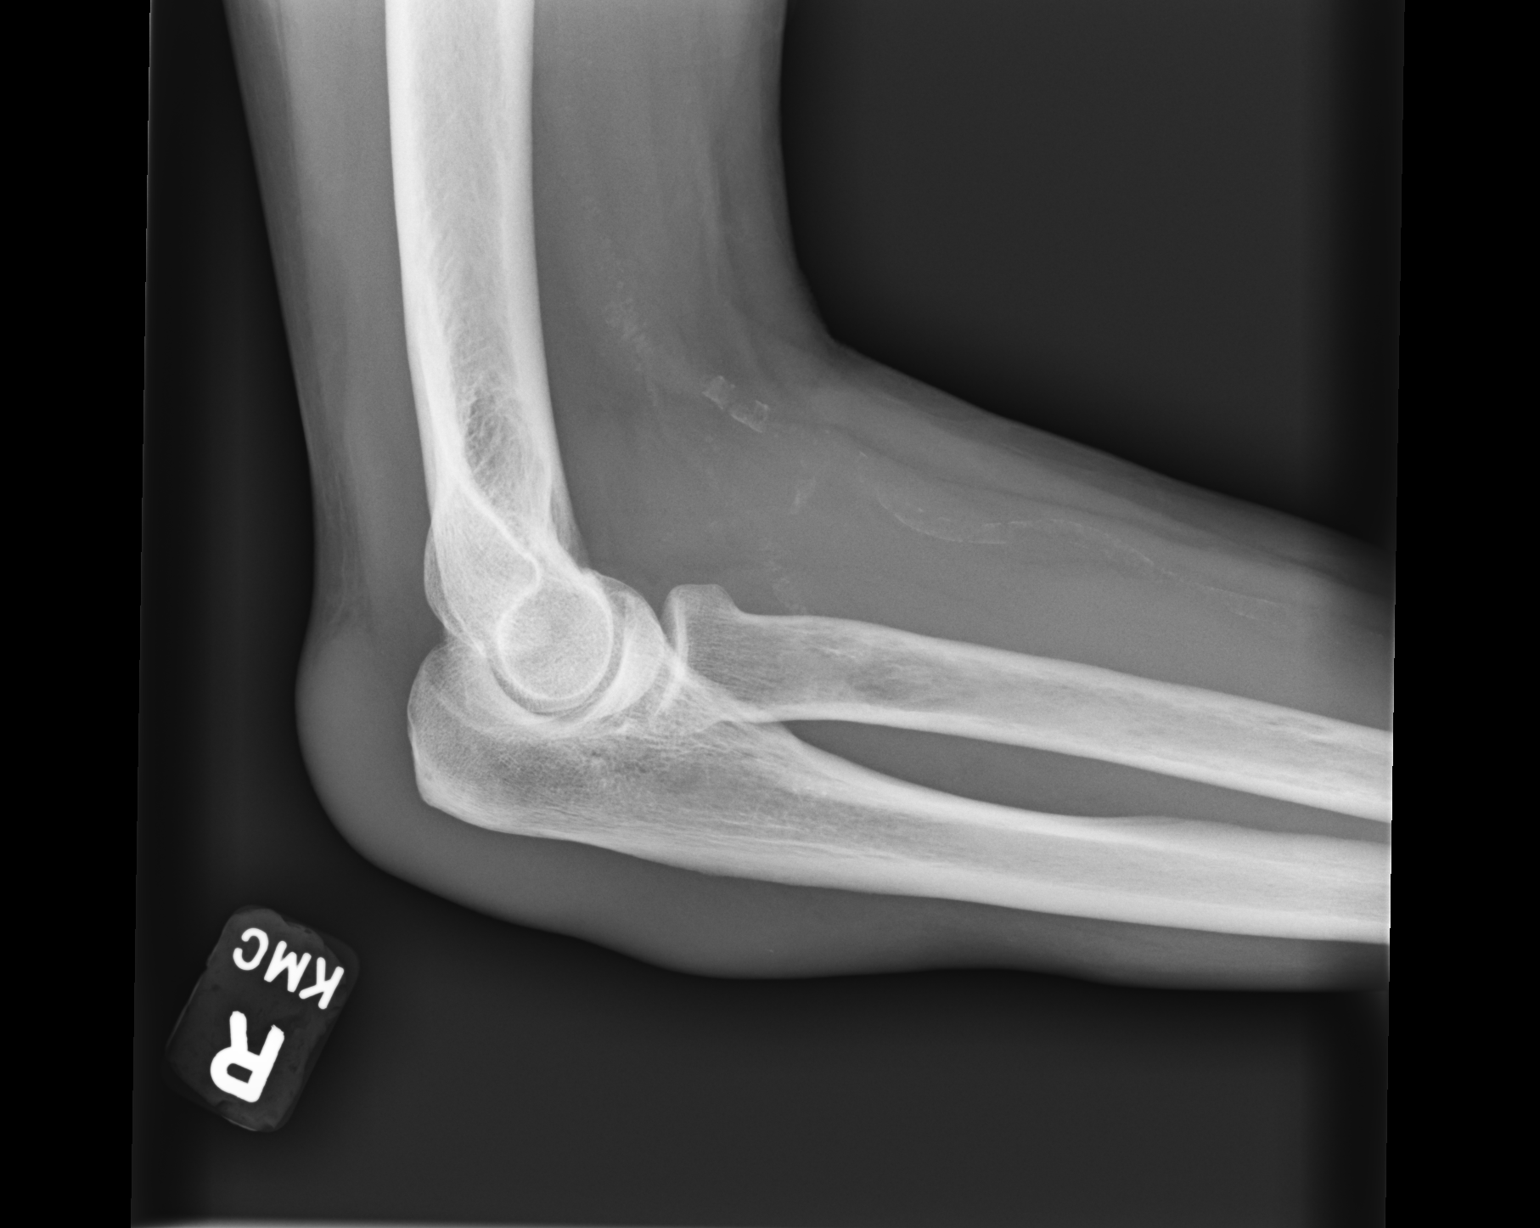

[2 of 2 positions shown; findings below may reference images not displayed]

FINDINGS: There is no evidence of fracture, dislocation, or joint effusion.
There is no evidence of arthropathy or other focal bone abnormality.
Soft tissue swelling overlying the olecranon and extending into the
proximal forearm dorsally. Vascular calcifications.
IMPRESSION: 1. Soft tissue swelling overlying the olecranon and extending into
the proximal forearm dorsally, findings likely representing
olecranon bursitis.
2. No acute fracture or dislocation.

## 2021-11-20 ENCOUNTER — Encounter: Payer: Self-pay | Admitting: *Deleted

## 2021-11-20 ENCOUNTER — Telehealth: Payer: Self-pay | Admitting: Family Medicine

## 2021-11-20 ENCOUNTER — Other Ambulatory Visit: Payer: Self-pay | Admitting: Family Medicine

## 2021-11-20 ENCOUNTER — Encounter: Payer: Self-pay | Admitting: Gastroenterology

## 2021-11-20 DIAGNOSIS — Z1211 Encounter for screening for malignant neoplasm of colon: Secondary | ICD-10-CM

## 2021-11-20 NOTE — Telephone Encounter (Signed)
Patient called stating he is needing a order or referral placed for a colonoscopy, he is having a kidney transplant and is going to the consultation on the 21st of February. They are needing him to get it done as soon as possible or atleast have proof it has been scheduled when he goes to the consultation. Please advise. Thanks!!

## 2021-11-20 NOTE — Telephone Encounter (Signed)
Sure I have placed a referral to GI for screening colonoscopy. Pls could you let the pt know? Thank you.

## 2021-12-04 ENCOUNTER — Ambulatory Visit (AMBULATORY_SURGERY_CENTER): Payer: Self-pay | Admitting: *Deleted

## 2021-12-04 ENCOUNTER — Telehealth: Payer: Self-pay | Admitting: *Deleted

## 2021-12-04 ENCOUNTER — Other Ambulatory Visit: Payer: Self-pay

## 2021-12-04 VITALS — Ht 70.0 in | Wt 199.5 lb

## 2021-12-04 DIAGNOSIS — N186 End stage renal disease: Secondary | ICD-10-CM

## 2021-12-04 NOTE — Progress Notes (Signed)
During PV pt stated he was doing dialysis three days a week for end stage renal disease.  Informed pt procedure could not be done at [facility] [place] scheduled. PT stated an understanding.

## 2021-12-04 NOTE — Telephone Encounter (Signed)
During PV, pt stated he was undergoing dialysis three days a week.  Informed pt we could not do colonoscopy here, would need an OV and procedure scheduled at [facility]. Pt stated an understanding, OV scheduled. Pt needed first available as he is waiting on this procedure to be placed on transplant list.  OV scheduled with Dr Ardis Hughs on 3/27 at 2:30pm.

## 2021-12-18 ENCOUNTER — Other Ambulatory Visit: Payer: Self-pay

## 2021-12-18 ENCOUNTER — Ambulatory Visit (INDEPENDENT_AMBULATORY_CARE_PROVIDER_SITE_OTHER): Payer: Managed Care, Other (non HMO) | Admitting: Family Medicine

## 2021-12-18 ENCOUNTER — Encounter: Payer: Managed Care, Other (non HMO) | Admitting: Gastroenterology

## 2021-12-18 VITALS — BP 153/74 | HR 64 | Ht 70.0 in | Wt 199.6 lb

## 2021-12-18 DIAGNOSIS — M79645 Pain in left finger(s): Secondary | ICD-10-CM | POA: Diagnosis not present

## 2021-12-18 NOTE — Patient Instructions (Signed)
I placed a referral for sports medicine and they should reach out to you in the next few days to set up an appointment.  If you do not hear from them in the next few days please let us know. ? ?For your blood pressure it is a bit high today.  I want you to continue checking it at home.  If you see numbers consistently above 130 for the top number please let us know. ?

## 2021-12-18 NOTE — Progress Notes (Signed)
? ? ?  SUBJECTIVE:  ? ?CHIEF COMPLAINT / HPI:  ? ?Left middle finger pain: ?Initially happened in mid January.  He states he woke up with finger pain and swelling.  States that since his xray he continues to have pain and that the pain is getting worse. He denies any trauma to it. His previously xray was negative for fractures. He states originally he woke up with it hurting but denies trauma. No other joints with similar symptoms. States it is difficult to move due to swelling and discomfort.  Denies a history of gout. ? ?PERTINENT  PMH / PSH: ESRD on HD ? ?OBJECTIVE:  ? ?BP (!) 153/74   Pulse 64   Wt 199 lb 9.6 oz (90.5 kg)   SpO2 100%   BMI 28.64 kg/m?   ? ?General: NAD, pleasant, able to participate in exam ?Respiratory: No respiratory distress ?MSK: Left middle finger with noted swelling compared to the other digits on the contralateral hand, mild warmth throughout the finger with no isolated joint being more swollen or warm than the rest.  He has pain with palpation of the PIP and DIP as well as the base of the finger.  He has limited movement with finger flexion.  He is not able to fully extend his DIP joint.  Cap refill less than 2 seconds ?Psych: Normal affect and mood ? ?ASSESSMENT/PLAN:  ? ?Left middle finger pain: ?Has been going on for about 2 months.  Had previous x-rays which showed no bony abnormality.  He is also had a recent uric acid level last week which was 4.2.  This suggest against gout.  The duration of the pain suggest against infection/septic joint.  Overall differential can include a mechanical trauma, less likely gout given the duration and low uric acid level.  Pseudogout is possible but seems less likely.  X-ray did not show an acute fracture but a hairline fracture is possible, additionally he is not able to fully extend the DIP joint which he thinks he was able to do in the past suggesting a tear of that tendon.  Recommend referral for sports medicine for further evaluation.  Less  concern for rheumatologic causes as this is an isolated area of pain and he has not had other bouts of painful joints or systemic symptoms such as fevers. ?  ?Hypertension: ?Blood pressure elevated 153/74.  Patient endorses missing no doses of his blood pressure medication. He does have a blood pressure cuff at home.  He thinks his pressure is high because he has been "resting around all day".  Recommended to check his pressures at home over the next 1 week and follow-up if his pressures remain over 672 systolic. ? ? ?Lurline Del, DO ?[place]  ?

## 2021-12-24 ENCOUNTER — Ambulatory Visit: Payer: Self-pay

## 2021-12-24 ENCOUNTER — Ambulatory Visit (INDEPENDENT_AMBULATORY_CARE_PROVIDER_SITE_OTHER): Payer: Managed Care, Other (non HMO) | Admitting: Family Medicine

## 2021-12-24 ENCOUNTER — Encounter: Payer: Self-pay | Admitting: Family Medicine

## 2021-12-24 VITALS — BP 148/82 | Ht 71.0 in | Wt 198.0 lb

## 2021-12-24 DIAGNOSIS — M25442 Effusion, left hand: Secondary | ICD-10-CM

## 2021-12-24 NOTE — Progress Notes (Signed)
PCP: Lattie Haw, MD ? ?Subjective:  ? ?HPI: ?Patient is a 56 y.o. male here for left 3rd digit pain. ? ?Patient has history of ESRD, candidate for kidney transplant. ?About 2 months ago he started to notice pain and swelling within left 3rd digit. ?No acute trauma or injury to cause this though does work with hands a lot. ?Just woke up with this pain one day. ?Difficulty trying to flex and extend 3rd digit. ?No fevers, chills, sweats though digit feels warm.  No erythema. ?Uric acid level checked 2 weeks ago was normal at 4.2. ?Has not tried any treatment for this. ? ?Past Medical History:  ?Diagnosis Date  ? Allergy   ? Diabetes mellitus   ? Type 2 DM, Patient reports DM Type 1 diagnosd age at age 11  ? Diabetic foot ulcers ([place])   ? ESRD (end stage renal disease) ([place])   ? [facility]S [facility]  ? Foot callus 07/01/2018  ? Foreign body in left foot   ? with  infection  ? Hypertension   ? Meniscus tear   ? Multiple rib fractures   ? Personal history of diabetic foot ulcer, Left Foot   ? Type 2 diabetes mellitus with stage 4 chronic kidney disease, without long-term current use of insulin ([place]) 06/01/2012  ? ? ?Current Outpatient Medications on File Prior to Visit  ?Medication Sig Dispense Refill  ? acetaminophen (TYLENOL) 325 MG tablet Take 650 mg by mouth every 6 (six) hours as needed for mild pain or headache.     ? amLODipine (NORVASC) 10 MG tablet Take 1 tablet (10 mg total) by mouth at bedtime. 90 tablet 3  ? Blood Glucose Monitoring Suppl (BLOOD GLUCOSE MONITOR SYSTEM) w/Device KIT 1 kit by Does not apply route daily. 1 kit 0  ? calcium acetate (PHOSLO) 667 MG capsule Take 2,001 mg by mouth 3 (three) times daily.    ? calcium carbonate (TUMS - DOSED IN MG ELEMENTAL CALCIUM) 500 MG chewable tablet Chew 1 tablet by mouth daily.    ? carvedilol (COREG) 12.5 MG tablet Take 1 tablet (12.5 mg total) by mouth 2 (two) times daily with a meal. 180 tablet 3  ? carvedilol (COREG) 12.5 MG tablet Take by mouth.    ?  COVID-19 At-Home Test KIT 1 each by In Vitro route as needed. (Patient not taking: Reported on 12/04/2021) 1 kit 3  ? lidocaine-prilocaine (EMLA) cream Apply 1 application topically daily as needed (prior to port being accessed for dialysis). (Patient not taking: Reported on 12/04/2021)    ? ?No current facility-administered medications on file prior to visit.  ? ? ?Past Surgical History:  ?Procedure Laterality Date  ? A/V FISTULAGRAM Right 03/26/2021  ? Procedure: A/V FISTULAGRAM;  Surgeon: Waynetta Sandy, MD;  Location: [facility];  Service: Cardiovascular;  Laterality: Right;  ? AV FISTULA PLACEMENT Left 11/06/2020  ? Procedure: LEFT ARM FIRST STAGE BRACHIAL BASILIC ARTERIOVENOUS (AV) FISTULA CREATION;  Surgeon: Marty Heck, MD;  Location: [place];  Service: Vascular;  Laterality: Left;  ? AV FISTULA PLACEMENT Right 12/05/2020  ? Procedure: RADIOCEPHALIC ARTERIOVENOUS (AV) FISTULA CREATION RIGHT ARM;  Surgeon: Angelia Mould, MD;  Location: [place];  Service: Vascular;  Laterality: Right;  ? EYE SURGERY Bilateral   ? lazer  ? I & D EXTREMITY  04/23/2012  ? Procedure: IRRIGATION AND DEBRIDEMENT EXTREMITY;  Surgeon: Newt Minion, MD;  Location: [place];  Service: Orthopedics;  Laterality: Left;  Irrigation and Debridement  Left Foot  ? I & D EXTREMITY  09/18/2012  ? Procedure: IRRIGATION AND DEBRIDEMENT EXTREMITY;  Surgeon: Newt Minion, MD;  Location: [place];  Service: Orthopedics;  Laterality: Left;  ? IR PERC TUN PERIT CATH WO PORT S&I /IMAG  08/30/2020  ? IR REMOVAL TUN CV CATH W/O FL  04/17/2021  ? IR US GUIDE VASC ACCESS RIGHT  08/30/2020  ? LIGATION OF ARTERIOVENOUS  FISTULA Left 12/05/2020  ? Procedure: LIGATION OF ARTERIOVENOUS  FISTULA LEFT ARM;  Surgeon: Angelia Mould, MD;  Location: [place];  Service: Vascular;  Laterality: Left;  ? MENISCUS REPAIR Left 04/2016  ? Orthopedist, Dr [name] in [place], [place]  ? ? ?Allergies  ?Allergen Reactions  ? Latex Rash and Other (See  Comments)  ?  Pt states he is allergic to latex condoms  ? Ace Inhibitors Cough  ? Wound Dressing Adhesive Itching and Rash  ?  Allergy to tape adhesive  ? ? ?BP (!) 148/82   Ht 5' 11"  (1.803 m)   Wt 198 lb (89.8 kg)   BMI 27.62 kg/m?  ? ?No flowsheet data found. ? ?No flowsheet data found. ? ?    ?Objective:  ?Physical Exam: ? ?Gen: NAD, comfortable in exam room ? ?Left hand: ?Obviously swollen 3rd digit.  No bruising, erythema.  Mild flexion deformity PIP and DIP.  No malrotation. ?TTP volar 3rd digit to about A1 pulley.  Mild tenderness throughout 3rd digit though. ?Collateral ligaments intact 3rd PIP, DIP. ?Able to resist flexion and extension 3rd PIP and DIP. ?Cap refill < 2 sec. ? ?Limited MSK u/s left 3rd digit:  Collateral ligaments intact DIP and PIP joints.  No joint effusions.  Extensor tendon intact.  Flexor tendons appear intact but with notable anechoic tenosynovitis and superficial soft tissue swelling also.  Mild-mod vascularity surrounding tendon sheath. ?  ?Assessment & Plan:  ?1. Left 3rd digit pain - with swelling, ultrasound with notable flexor tenosynovitis.  Present for 2 months with some warmth, positive [place] signs.  Ultrasound without obvious purulence however.  Will refer to hand surgery for further evaluation and treatment. ?

## 2021-12-31 ENCOUNTER — Ambulatory Visit: Payer: Managed Care, Other (non HMO) | Admitting: Gastroenterology

## 2022-01-01 ENCOUNTER — Ambulatory Visit (INDEPENDENT_AMBULATORY_CARE_PROVIDER_SITE_OTHER): Payer: Managed Care, Other (non HMO) | Admitting: Orthopedic Surgery

## 2022-01-01 ENCOUNTER — Other Ambulatory Visit: Payer: Self-pay

## 2022-01-01 ENCOUNTER — Encounter: Payer: Self-pay | Admitting: Orthopedic Surgery

## 2022-01-01 VITALS — BP 151/78 | HR 59 | Ht 71.0 in | Wt 198.0 lb

## 2022-01-01 DIAGNOSIS — M65332 Trigger finger, left middle finger: Secondary | ICD-10-CM | POA: Diagnosis not present

## 2022-01-01 DIAGNOSIS — M25442 Effusion, left hand: Secondary | ICD-10-CM

## 2022-01-01 MED ORDER — BETAMETHASONE SOD PHOS & ACET 6 (3-3) MG/ML IJ SUSP
6.0000 mg | INTRAMUSCULAR | Status: AC | PRN
  Administered 2022-01-01: 6 mg via INTRA_ARTICULAR

## 2022-01-01 NOTE — Progress Notes (Signed)
? ?Office Visit Note ?  ?Patient: [name]           ?Date of Birth: 1966/06/13           ?MRN: [id number] ?Visit Date: 01/01/2022 ?             ?Requested by: Dene Gentry, MD ?[address] ?[place],  [place] [address] ?PCP: Lattie Haw, MD ? ? ?Assessment & Plan: ?Visit Diagnoses:  ?1. Finger joint swelling, left   ?2. Trigger finger, left middle finger   ? ? ?Plan: Discussed with patient that his atraumatic finger swelling seems most consistent with a severe trigger finger given the palpable and visible catching of the finger at the A1 pulley with full flexion.  We discussed the nature of noninfectious versus infectious tenosynovitis.  His tenosynovitis is likely reactive and inflammation secondary to the stenosing at the A1 pulley.  We discussed treatment for trigger fingers including corticosteroid injection and A1 pulley release.  We will start with a corticosteroid junction today.  He was counseled that he should return the office if he develops worsening symptoms following the injection.  He is a diabetic so we discussed close monitoring of his blood sugar following the injection.  I can see him back in 4 to 6 weeks if his symptoms do not worsen. ? ?Follow-Up Instructions: No follow-ups on file.  ? ?Orders:  ?No orders of the defined types were placed in this encounter. ? ?No orders of the defined types were placed in this encounter. ? ? ? ? Procedures: ?Hand/UE Inj: L long A1 for trigger finger on 01/01/2022 9:13 AM ?Indications: pain and therapeutic ?Details: 25 G needle, volar approach ?Medications: 6 mg betamethasone acetate-betamethasone sodium phosphate 6 (3-3) MG/ML ?Procedure, treatment alternatives, risks and benefits explained, specific risks discussed. Consent was given by the patient. Immediately prior to procedure a time out was called to verify the correct patient, procedure, equipment, support staff and site/side marked as required. Patient was prepped and draped in the usual  sterile fashion.  ? ? ? ? ?Clinical Data: ?No additional findings. ? ? ?Subjective: ?Chief Complaint  ?Patient presents with  ? Left Middle Finger - Edema, Pain  ? ? ?This is a 56 year old right-hand-dominant male who presents with atraumatic swelling of the left middle finger.  This started in early January.  He says he woke up with pain and swelling of the finger mostly around the MP and PIP joints.  He is unable make a full fist secondary to pain and stiffness.  He does state that he wakes up the morning he is able to make a full fist but the middle finger will "pop back out.".  He denies any cuts, scrapes, or breaks in the skin in his finger.  He denies any previous erythema of this finger.  He denies any systemic symptoms or recent illnesses.  He has no history of hand infections.  He was seen by the sports medicine office and ultrasound obtained which demonstrated flexor synovitis but without purulence or fluid collection. ? ? ?Review of Systems ? ? ?Objective: ?Vital Signs: BP (!) 151/78 (BP Location: Left Arm, Patient Position: Sitting)   Pulse (!) 59   Ht 5\' 11"  (1.803 m)   Wt 198 lb (89.8 kg)   BMI 27.62 kg/m?  ? ?Physical Exam ?Constitutional:   ?   Appearance: Normal appearance.  ?Cardiovascular:  ?   Rate and Rhythm: Normal rate.  ?   Pulses: Normal pulses.  ?Pulmonary:  ?  Effort: Pulmonary effort is normal.  ?Skin: ?   General: Skin is warm and dry.  ?   Capillary Refill: Capillary refill takes less than 2 seconds.  ?Neurological:  ?   Mental Status: He is alert.  ? ? ?Left Hand Exam  ? ?Tenderness  ?Left hand tenderness location: TTP at middle finger that is worst at the level of the A1 pulley and proximal phalanx and mild at the level of the PIP joint and A4 pulley.  ? ?Other  ?Erythema: absent ?Sensation: normal ?Pulse: present ? ?Comments:  Diffuse swelling of finger centered around the proximal phalanx.  No erythema.  Able to make near complete fist with palpable and visible triggering at A1  pulley.  Finger held slightly flexed. Pain at dorsal finger with full passive extension.  ? ? ? ? ?Specialty Comments:  ?No specialty comments available. ? ?Imaging: ?No results found. ? ? ?PMFS History: ?Patient Active Problem List  ? Diagnosis Date Noted  ? Trigger finger, left middle finger 01/01/2022  ? Finger joint swelling, left 10/21/2021  ? Impacted cerumen of right ear 06/19/2021  ? Olecranon bursitis of both elbows 01/25/2021  ? End stage renal disease ([place]) 01/10/2021  ? Hyperlipidemia 05/28/2016  ? Hypertension 04/14/2012  ? DM (diabetes mellitus), type 2, uncontrolled 04/13/2012  ? ?Past Medical History:  ?Diagnosis Date  ? Allergy   ? Diabetes mellitus   ? Type 2 DM, Patient reports DM Type 1 diagnosd age at age 74  ? Diabetic foot ulcers ([place])   ? ESRD (end stage renal disease) ([place])   ? [facility]  ? Foot callus 07/01/2018  ? Foreign body in left foot   ? with  infection  ? Hypertension   ? Meniscus tear   ? Multiple rib fractures   ? Personal history of diabetic foot ulcer, Left Foot   ? Type 2 diabetes mellitus with stage 4 chronic kidney disease, without long-term current use of insulin ([place]) 06/01/2012  ?  ?Family History  ?Problem Relation Age of Onset  ? Diabetes type II Mother   ? Arthritis Mother   ? Diabetes Mother   ? Hyperlipidemia Mother   ? Hypertension Mother   ? Diabetes type II Sister   ? Diabetes Father   ?  ?Past Surgical History:  ?Procedure Laterality Date  ? A/V FISTULAGRAM Right 03/26/2021  ? Procedure: A/V FISTULAGRAM;  Surgeon: Waynetta Sandy, MD;  Location: [facility];  Service: Cardiovascular;  Laterality: Right;  ? AV FISTULA PLACEMENT Left 11/06/2020  ? Procedure: LEFT ARM FIRST STAGE BRACHIAL BASILIC ARTERIOVENOUS (AV) FISTULA CREATION;  Surgeon: Marty Heck, MD;  Location: [place];  Service: Vascular;  Laterality: Left;  ? AV FISTULA PLACEMENT Right 12/05/2020  ? Procedure: RADIOCEPHALIC ARTERIOVENOUS (AV) FISTULA CREATION RIGHT ARM;   Surgeon: Angelia Mould, MD;  Location: [place];  Service: Vascular;  Laterality: Right;  ? EYE SURGERY Bilateral   ? lazer  ? I & D EXTREMITY  04/23/2012  ? Procedure: IRRIGATION AND DEBRIDEMENT EXTREMITY;  Surgeon: Newt Minion, MD;  Location: [facility];  Service: Orthopedics;  Laterality: Left;  Irrigation and Debridement Left Foot  ? I & D EXTREMITY  09/18/2012  ? Procedure: IRRIGATION AND DEBRIDEMENT EXTREMITY;  Surgeon: Newt Minion, MD;  Location: [place];  Service: Orthopedics;  Laterality: Left;  ? IR PERC TUN PERIT CATH WO PORT S&I /IMAG  08/30/2020  ? IR REMOVAL TUN CV CATH W/O FL  04/17/2021  ?  IR US GUIDE VASC ACCESS RIGHT  08/30/2020  ? LIGATION OF ARTERIOVENOUS  FISTULA Left 12/05/2020  ? Procedure: LIGATION OF ARTERIOVENOUS  FISTULA LEFT ARM;  Surgeon: Angelia Mould, MD;  Location: [place];  Service: Vascular;  Laterality: Left;  ? MENISCUS REPAIR Left 04/2016  ? Orthopedist, Dr [name] in [place], [place]  ? ?Social History  ? ?Occupational History  ? Not on file  ?Tobacco Use  ? Smoking status: Former  ?  Types: Cigarettes  ?  Quit date: 09/03/2012  ?  Years since quitting: 9.3  ? Smokeless tobacco: Former  ?  Quit date: 09/03/2012  ?Substance and Sexual Activity  ? Alcohol use: Not Currently  ? Drug use: Not Currently  ? Sexual activity: Yes  ? ? ? ? ? ? ?

## 2022-01-02 ENCOUNTER — Encounter: Payer: Self-pay | Admitting: Gastroenterology

## 2022-01-02 ENCOUNTER — Ambulatory Visit (INDEPENDENT_AMBULATORY_CARE_PROVIDER_SITE_OTHER): Payer: Managed Care, Other (non HMO) | Admitting: Gastroenterology

## 2022-01-02 VITALS — BP 140/74 | HR 66 | Ht 71.0 in | Wt 196.1 lb

## 2022-01-02 DIAGNOSIS — Z1211 Encounter for screening for malignant neoplasm of colon: Secondary | ICD-10-CM

## 2022-01-02 MED ORDER — NA SULFATE-K SULFATE-MG SULF 17.5-3.13-1.6 GM/177ML PO SOLN: 1.0000 | ORAL | 0 refills | Status: DC

## 2022-01-02 NOTE — Patient Instructions (Signed)
If you are age 54 or younger, your body mass index should be between 19-25. Your Body mass index is 27.35 kg/m?[name] If this is out of the aformentioned range listed, please consider follow up with your Primary Care Provider.  ?________________________________________________________ ? ?The [facility] providers would like to encourage you to use [facility] to communicate with providers for non-urgent requests or questions.  Due to long hold times on the telephone, sending your provider a message by [facility] may be a faster and more efficient way to get a response.  Please allow 48 business hours for a response.  Please remember that this is for non-urgent requests.  ?_______________________________________________________ ? ?You have been scheduled for a colonoscopy. Please follow written instructions given to you at your visit today.  ?Please pick up your prep supplies at the pharmacy within the next 1-3 days. ?If you use inhalers (even only as needed), please bring them with you on the day of your procedure. ? ?Due to recent changes in healthcare laws, you may see the results of your imaging and laboratory studies on MyChart before your provider has had a chance to review them.  We understand that in some cases there may be results that are confusing or concerning to you. Not all laboratory results come back in the same time frame and the provider may be waiting for multiple results in order to interpret others.  Please give [place] 48 hours in order for your provider to thoroughly review all the results before contacting the office for clarification of your results.  ? ?Thank you for entrusting me with your care and choosing [facility]. ? ?Dr Ardis Hughs ? ?

## 2022-01-02 NOTE — Progress Notes (Signed)
?HPI: ?This is a very pleasant 56 year old man who was referred to me by Lattie Haw, MD  to evaluate him for colon cancer screening.   ? ?He has renal failure from diabetes.  He has been on hemodialysis for about a year.  He is currently undergoing transplant evaluation.  Part of that evaluation is for him to get up-to-date on cancer screening such as colon cancer screening. ? ?He has another head colon cancer screening test before.  He has no GI symptoms.  Specifically no constipation, no diarrhea, no bleeding, no serious abdominal pains, no nausea or vomiting. ? ?Colon cancer does not run in his family ? ?Old Data Reviewed: ? ?Blood work 12/2021 CBC was normal except for hemoglobin 13.7 which on this particular assay is slightly low.  Complete metabolic profile showed creatinine 7.1, normal liver tests.  INR normal ? ? ? ?Review of systems: ?Pertinent positive and negative review of systems were noted in the above HPI section. All other review negative. ? ? ?Past Medical History:  ?Diagnosis Date  ? Allergy   ? Diabetes mellitus   ? Type 2 DM, Patient reports DM Type 1 diagnosd age at age 13  ? Diabetic foot ulcers ([place])   ? ESRD (end stage renal disease) ([place])   ? [facility]  ? Foot callus 07/01/2018  ? Foreign body in left foot   ? with  infection  ? Hypertension   ? Meniscus tear   ? Multiple rib fractures   ? Personal history of diabetic foot ulcer, Left Foot   ? Type 2 diabetes mellitus with stage 4 chronic kidney disease, without long-term current use of insulin ([place]) 06/01/2012  ? ? ?Past Surgical History:  ?Procedure Laterality Date  ? A/V FISTULAGRAM Right 03/26/2021  ? Procedure: A/V FISTULAGRAM;  Surgeon: Waynetta Sandy, MD;  Location: [facility];  Service: Cardiovascular;  Laterality: Right;  ? AV FISTULA PLACEMENT Left 11/06/2020  ? Procedure: LEFT ARM FIRST STAGE BRACHIAL BASILIC ARTERIOVENOUS (AV) FISTULA CREATION;  Surgeon: Marty Heck, MD;  Location: [facility];   Service: Vascular;  Laterality: Left;  ? AV FISTULA PLACEMENT Right 12/05/2020  ? Procedure: RADIOCEPHALIC ARTERIOVENOUS (AV) FISTULA CREATION RIGHT ARM;  Surgeon: Angelia Mould, MD;  Location: [facility];  Service: Vascular;  Laterality: Right;  ? EYE SURGERY Bilateral   ? lazer  ? I & D EXTREMITY  04/23/2012  ? Procedure: IRRIGATION AND DEBRIDEMENT EXTREMITY;  Surgeon: Newt Minion, MD;  Location: [facility];  Service: Orthopedics;  Laterality: Left;  Irrigation and Debridement Left Foot  ? I & D EXTREMITY  09/18/2012  ? Procedure: IRRIGATION AND DEBRIDEMENT EXTREMITY;  Surgeon: Newt Minion, MD;  Location: [facility];  Service: Orthopedics;  Laterality: Left;  ? IR PERC TUN PERIT CATH WO PORT S&I /IMAG  08/30/2020  ? IR REMOVAL TUN CV CATH W/O FL  04/17/2021  ? IR US GUIDE VASC ACCESS RIGHT  08/30/2020  ? LIGATION OF ARTERIOVENOUS  FISTULA Left 12/05/2020  ? Procedure: LIGATION OF ARTERIOVENOUS  FISTULA LEFT ARM;  Surgeon: Angelia Mould, MD;  Location: [facility];  Service: Vascular;  Laterality: Left;  ? MENISCUS REPAIR Left 04/2016  ? Orthopedist, Dr [name] in [place], [place]  ? ? ?Current Outpatient Medications  ?Medication Instructions  ? acetaminophen (TYLENOL) 650 mg, Oral, Every 6 hours PRN  ? amLODipine (NORVASC) 10 mg, Oral, Daily at bedtime  ? calcium acetate (PHOSLO) 2,001 mg, Oral, 3 times daily  ?  calcium carbonate (TUMS - DOSED IN MG ELEMENTAL CALCIUM) 500 MG chewable tablet 1 tablet, Oral, Daily  ? carvedilol (COREG) 12.5 MG tablet Oral  ? lidocaine-prilocaine (EMLA) cream 1 application., Topical, Daily PRN  ? ? ?Allergies as of 01/02/2022 - Review Complete 01/02/2022  ?Allergen Reaction Noted  ? Latex Rash and Other (See Comments) 04/22/2012  ? Ace inhibitors Cough 03/01/2020  ? Wound dressing adhesive Itching and Rash 09/01/2020  ? ? ?Family History  ?Problem Relation Age of Onset  ? Diabetes type II Mother   ? Arthritis Mother   ? Diabetes Mother   ? Hyperlipidemia Mother   ? Hypertension Mother   ?  Diabetes Father   ? Diabetes type II Sister   ? Colon cancer Neg Hx   ? Stomach cancer Neg Hx   ? Esophageal cancer Neg Hx   ? Pancreatic cancer Neg Hx   ? ? ?Social History  ? ?Socioeconomic History  ? Marital status: Married  ?  Spouse name: Not on file  ? Number of children: Not on file  ? Years of education: Not on file  ? Highest education level: Not on file  ?Occupational History  ? Not on file  ?Tobacco Use  ? Smoking status: Former  ?  Types: Cigarettes  ?  Quit date: 09/03/2012  ?  Years since quitting: 9.3  ? Smokeless tobacco: Former  ?  Quit date: 09/03/2012  ?Vaping Use  ? Vaping Use: Never used  ?Substance and Sexual Activity  ? Alcohol use: Not Currently  ? Drug use: Not Currently  ? Sexual activity: Yes  ?Other Topics Concern  ? Not on file  ?Social History Narrative  ? 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   ? ?Social Determinants of Health  ? ?Financial Resource Strain: Not on file  ?Food Insecurity: Not on file  ?Transportation Needs: Not on file  ?Physical Activity: Not on file  ?Stress: Not on file  ?Social Connections: Not on file  ?Intimate Partner Violence: Not on file  ? ? ? ?Physical Exam: ?Ht 5\' 11"  (1.803 m)   Wt 196 lb 2 oz (89 kg)   BMI 27.35 kg/m?  ?Constitutional: generally well-appearing ?Psychiatric: alert and oriented x3 ?Eyes: extraocular movements intact ?Mouth: oral pharynx moist, no lesions ?Neck: supple no lymphadenopathy ?Cardiovascular: heart regular rate and rhythm ?Lungs: clear to auscultation bilaterally ?Abdomen: soft, nontender, nondistended, no obvious ascites, no peritoneal signs, normal bowel sounds ?Extremities: no lower extremity edema bilaterally ?Skin: no lesions on visible extremities ? ? ?Assessment and plan: ?56 y.o. male with routine risk for colon cancer, increased risk for procedural complications given renal failure, hemodialysis ? ?I recommended colonoscopy for colon cancer screening at his soonest convenience.  This  will need to be done in the hospital setting given his hemodialysis.  I see no reason for additional blood tests or imaging studies prior to then. ? ?Please see the "Patient Instructions" section for addition details about the plan. ? ? ?Owens Loffler, MD ?[facility] ?01/02/2022, 10:52 AM ? ?Cc: Lattie Haw, MD ? ?Total time on date of encounter was 40  minutes (this included time spent preparing to see the patient reviewing records; obtaining and/or reviewing separately obtained history; performing a medically appropriate exam and/or evaluation; counseling and educating the patient and family if present; ordering medications, tests or procedures if applicable; and documenting clinical information in the health record). ? ? ?

## 2022-02-14 ENCOUNTER — Encounter (HOSPITAL_COMMUNITY): Payer: Self-pay | Admitting: Gastroenterology

## 2022-02-14 NOTE — Progress Notes (Signed)
Attempted to obtain medical history via telephone, unable to reach at this time. I left a voicemail to return pre surgical testing department's phone call.  

## 2022-02-18 ENCOUNTER — Telehealth: Payer: Self-pay | Admitting: Gastroenterology

## 2022-02-18 NOTE — Telephone Encounter (Signed)
The pt called to report he had recent stress test and will be having a stent placed soon.  He is cancelling his screening colon and will call back when he is able to reschedule. FYI Dr Ardis Hughs  ?

## 2022-02-18 NOTE — Telephone Encounter (Signed)
Inbound call from patient stating that he recently went to his cardiologist to do a stress test and they found something wrong with his heart. Patient is seeking advice if he needs to postpone his procedure on 5/18. Please advise.  ?

## 2022-02-21 ENCOUNTER — Ambulatory Visit (HOSPITAL_COMMUNITY)
Admission: RE | Admit: 2022-02-21 | Payer: Managed Care, Other (non HMO) | Source: Home / Self Care | Admitting: Gastroenterology

## 2022-02-21 SURGERY — COLONOSCOPY WITH PROPOFOL
Anesthesia: Monitor Anesthesia Care

## 2022-03-12 ENCOUNTER — Encounter: Payer: Self-pay | Admitting: *Deleted

## 2022-04-29 ENCOUNTER — Telehealth: Payer: Self-pay | Admitting: Gastroenterology

## 2022-04-30 ENCOUNTER — Other Ambulatory Visit: Payer: Self-pay

## 2022-04-30 DIAGNOSIS — Z1211 Encounter for screening for malignant neoplasm of colon: Secondary | ICD-10-CM

## 2022-04-30 NOTE — Telephone Encounter (Signed)
Colon scheduled, pt instructed and medications reviewed.  Patient instructions mailed to home and sent to My Chart.  Patient to call with any questions or concerns.   

## 2022-04-30 NOTE — Telephone Encounter (Signed)
Left message on machine to call back  

## 2022-04-30 NOTE — Telephone Encounter (Signed)
Colon scheduled for 9/21 at 11 am with DJ at [facility]

## 2022-05-02 DIAGNOSIS — R9439 Abnormal result of other cardiovascular function study: Secondary | ICD-10-CM | POA: Insufficient documentation

## 2022-05-17 ENCOUNTER — Telehealth: Payer: Self-pay | Admitting: Gastroenterology

## 2022-05-20 NOTE — Telephone Encounter (Signed)
I spoke with the pt and he has decided to wait on colonoscopy until he has finished his cardiac workup.  He will call back when he is ready to reschedule.

## 2022-06-27 ENCOUNTER — Ambulatory Visit (HOSPITAL_COMMUNITY): Admit: 2022-06-27 | Payer: Commercial Managed Care - HMO | Admitting: Gastroenterology

## 2022-06-27 ENCOUNTER — Encounter (HOSPITAL_COMMUNITY): Payer: Self-pay

## 2022-06-27 SURGERY — COLONOSCOPY WITH PROPOFOL
Anesthesia: Monitor Anesthesia Care

## 2022-07-03 ENCOUNTER — Ambulatory Visit: Payer: Commercial Managed Care - HMO | Admitting: Family Medicine

## 2022-07-03 NOTE — Progress Notes (Deleted)
    SUBJECTIVE:   CHIEF COMPLAINT / HPI:   Type 2 Diabetes Patient is a 56 y.o. male who presents today for diabetes follow-up.  Home medications include: *** Patient reports *** medication compliance. Patient checks sugar *** at home. Typically range *** *** hypoglycemic episodes/symptoms  Most recent A1Cs:  Lab Results  Component Value Date   HGBA1C 5.5 10/18/2021   HGBA1C 6.0 06/19/2021   HGBA1C 5.6 01/25/2021   Last Microalbumin, LDL, Creatinine: Lab Results  Component Value Date   MICROALBUR 150 03/07/2020   LDLCALC 122 (H) 10/18/2021   CREATININE 10.60 (H) 03/26/2021    Patient {rwisisnot:[id number]} up to date on diabetic eye. Patient {rwisisnot:[id number]} up to date on diabetic foot exam.  Lipid panel care everywhere 12/2021 LDL 140  HTN   HLD    PERTINENT  PMH / PSH: CAD, ESRD awaiting renal transplant***  OBJECTIVE:   There were no vitals taken for this visit.  ***  ASSESSMENT/PLAN:   No problem-specific Assessment & Plan notes found for this encounter.     Alcus Dad, MD [place]

## 2022-07-16 ENCOUNTER — Ambulatory Visit: Payer: Commercial Managed Care - HMO | Admitting: Family Medicine

## 2022-10-01 ENCOUNTER — Ambulatory Visit: Payer: Commercial Managed Care - HMO | Admitting: Podiatry

## 2022-10-15 ENCOUNTER — Ambulatory Visit (INDEPENDENT_AMBULATORY_CARE_PROVIDER_SITE_OTHER): Payer: Commercial Managed Care - HMO | Admitting: Podiatry

## 2022-10-15 DIAGNOSIS — L97522 Non-pressure chronic ulcer of other part of left foot with fat layer exposed: Secondary | ICD-10-CM | POA: Diagnosis not present

## 2022-10-15 DIAGNOSIS — L539 Erythematous condition, unspecified: Secondary | ICD-10-CM | POA: Diagnosis not present

## 2022-10-15 DIAGNOSIS — E1165 Type 2 diabetes mellitus with hyperglycemia: Secondary | ICD-10-CM

## 2022-10-15 MED ORDER — DOXYCYCLINE HYCLATE 100 MG PO TABS: 100.0000 mg | ORAL_TABLET | Freq: Two times a day (BID) | ORAL | 0 refills | Status: DC

## 2022-10-15 NOTE — Progress Notes (Signed)
Subjective:  Patient ID: [name], male    DOB: 11/02/1965,  MRN: [id number]  No chief complaint on file.   57 y.o. male presents for wound care.  Patient presents with left submetatarsal 3 ulceration with fat layer exposed patient states pain is improving present for quite some time is progressive gotten worse.  She is to be started noticing some pain.  He wanted get it evaluated he is a diabetic.  He does not want to lose any body parts.  He has not done anything for it.   Review of Systems: Negative except as noted in the HPI. Denies N/V/F/Ch.  Past Medical History:  Diagnosis Date   Allergy    Diabetes mellitus    Type 2 DM, Patient reports DM Type 1 diagnosd age at age 10   Diabetic foot ulcers ([place])    ESRD (end stage renal disease) ([place])    [facility]   Foot callus 07/01/2018   Foreign body in left foot    with  infection   Hypertension    Meniscus tear    Multiple rib fractures    Personal history of diabetic foot ulcer, Left Foot    Type 2 diabetes mellitus with stage 4 chronic kidney disease, without long-term current use of insulin ([place]) 06/01/2012    Current Outpatient Medications:    doxycycline (VIBRA-TABS) 100 MG tablet, Take 1 tablet (100 mg total) by mouth 2 (two) times daily., Disp: 60 tablet, Rfl: 0   acetaminophen (TYLENOL) 325 MG tablet, Take 650 mg by mouth every 6 (six) hours as needed for mild pain or headache. , Disp: , Rfl:    amLODipine (NORVASC) 10 MG tablet, Take 1 tablet (10 mg total) by mouth at bedtime., Disp: 90 tablet, Rfl: 3   calcium acetate (PHOSLO) 667 MG capsule, Take 2,001 mg by mouth 3 (three) times daily., Disp: , Rfl:    calcium carbonate (TUMS - DOSED IN MG ELEMENTAL CALCIUM) 500 MG chewable tablet, Chew 1 tablet by mouth daily., Disp: , Rfl:    carvedilol (COREG) 12.5 MG tablet, Take by mouth., Disp: , Rfl:    lidocaine-prilocaine (EMLA) cream, Apply 1 application. topically daily as needed (prior to port being  accessed for dialysis)., Disp: , Rfl:    Na Sulfate-K Sulfate-Mg Sulf (SUPREP BOWEL PREP KIT) 17.5-3.13-1.6 GM/177ML SOLN, Take 1 kit by mouth as directed., Disp: 324 mL, Rfl: 0  Social History   Tobacco Use  Smoking Status Former   Types: Cigarettes   Quit date: 09/03/2012   Years since quitting: 10.1  Smokeless Tobacco Former   Quit date: 09/03/2012    Allergies  Allergen Reactions   Latex Rash and Other (See Comments)    Pt states he is allergic to latex condoms   Ace Inhibitors Cough   Wound Dressing Adhesive Itching and Rash    Allergy to tape adhesive   Objective:  There were no vitals filed for this visit. There is no height or weight on file to calculate BMI. Constitutional Well developed. Well nourished.  Vascular Dorsalis pedis pulses palpable bilaterally. Posterior tibial pulses palpable bilaterally. Capillary refill normal to all digits.  No cyanosis or clubbing noted. Pedal hair growth normal.  Neurologic Normal speech. Oriented to person, place, and time. Protective sensation absent  Dermatologic Wound Location: Left submetatarsal elation with fat layer exposed.  No malodor present no purulent drainage noted.  Mild erythema. Wound Base: Mixed Granular/Fibrotic Peri-wound: Calloused Exudate: Scant/small amount Serosanguinous exudate Wound Measurements: -See  below  Orthopedic: No pain to palpation either foot.   Radiographs: None Assessment:   1. Erythema   2. 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 Uncontrolled type 2 diabetes mellitus with hyperglycemia ([place])    Plan:  Patient was evaluated and treated and all questions answered.  Ulcer left submetatarsal 3 ulceration with fat layer exposed -Debridement as below. -Dressed with Betadine wet-to-dry, DSD. -Continue off-loading with surgical shoe. -I will place him on 30 days of doxycycline prophy prophylaxis  Procedure: Excisional Debridement of Wound Tool: Sharp chisel  blade/tissue nipper Rationale: Removal of non-viable soft tissue from the wound to promote healing.  Anesthesia: none Pre-Debridement Wound Measurements: 1 cm x 0.5 cm x 0.3 cm  Post-Debridement Wound Measurements: 1.1 cm x 0.7 cm x 0.4 cm  Type of Debridement: Sharp Excisional Tissue Removed: Non-viable soft tissue Blood loss: Minimal (<50cc) Depth of Debridement: subcutaneous tissue. Technique: Sharp excisional debridement to bleeding, viable wound base.  Wound Progress: This is my initial evaluation of continue monitor the progression of the wound. Site healing conversation 7 Dressing: Dry, sterile, compression dressing. Disposition: Patient tolerated procedure well. Patient to return in 1 week for follow-up.  No follow-ups on file.

## 2022-10-28 ENCOUNTER — Telehealth: Payer: Self-pay | Admitting: *Deleted

## 2022-10-28 NOTE — Telephone Encounter (Signed)
Patient is calling to ask when he can get his foot wet, please advise.

## 2022-10-29 ENCOUNTER — Telehealth: Payer: Self-pay | Admitting: *Deleted

## 2022-10-29 NOTE — Telephone Encounter (Signed)
Called patient, no answer, left message and updated per physician that he can only get the  foot wet after the wound heals.

## 2022-11-06 ENCOUNTER — Ambulatory Visit: Payer: Commercial Managed Care - HMO | Admitting: Podiatry

## 2022-11-12 ENCOUNTER — Ambulatory Visit: Payer: Commercial Managed Care - HMO | Admitting: Podiatry

## 2022-11-17 NOTE — Progress Notes (Unsigned)
Cardiology Office Note:    Date:  11/19/2022   ID:  [name], DOB 1966-02-29, MRN [id number]  PCP:  Earl Dad, MD  Cardiologist:  Earl Heinz, MD  Electrophysiologist:  None   Referring MD: Earl Dad, MD   Chief Complaint  Patient presents with   Coronary Artery Disease    History of Present Illness:    [name] is a 57 y.o. male with a hx of CAD, ESRD, T2DM, hypertension who is referred by Dr. Rock Salinas for evaluation of irregular heart rhythm.  Reports was told at dialysis heart rhythm was irregular.  Denies any chest pain, dyspnea, lightheadedness, syncope, lower extremity edema, or palpitations.  Walks his dog daily for several bocks.  Reports occasionally smoked cigarettes when he drank alcohol but quit years ago.  Family history includes sister and mother have A-fib  Had positive stress test as part of transplant evaluation and underwent LHC 05/16/2022, which showed 40% mid LAD stenosis, 70% ostial D1, 99% D2 (small diffusely disease), diffusely diseased apical LAD but very small, 50% mid LCx, 100% OM1 with L-L collaterals, 100% OM 2 with L-L collaterals, 30% RCA, 60% RPL.   Past Medical History:  Diagnosis Date   Allergy    Diabetes mellitus    Type 2 DM, Patient reports DM Type 1 diagnosd age at age 57   Diabetic foot ulcers ([place])    ESRD (end stage renal disease) ([place])    [facility]   Foot callus 07/01/2018   Foreign body in left foot    with  infection   Hypertension    Meniscus tear    Multiple rib fractures    Personal history of diabetic foot ulcer, Left Foot    Type 2 diabetes mellitus with stage 4 chronic kidney disease, without long-term current use of insulin ([place]) 06/01/2012    Past Surgical History:  Procedure Laterality Date   A/V FISTULAGRAM Right 03/26/2021   Procedure: A/V FISTULAGRAM;  Surgeon: Waynetta Sandy, MD;  Location: [facility];  Service: Cardiovascular;  Laterality: Right;    AV FISTULA PLACEMENT Left 11/06/2020   Procedure: LEFT ARM FIRST STAGE BRACHIAL BASILIC ARTERIOVENOUS (AV) FISTULA CREATION;  Surgeon: Marty Heck, MD;  Location: [place] [facility];  Service: Vascular;  Laterality: Left;   AV FISTULA PLACEMENT Right 12/05/2020   Procedure: RADIOCEPHALIC ARTERIOVENOUS (AV) FISTULA CREATION RIGHT ARM;  Surgeon: Angelia Mould, MD;  Location: [facility];  Service: Vascular;  Laterality: Right;   EYE SURGERY Bilateral    lazer   I & D EXTREMITY  04/23/2012   Procedure: IRRIGATION AND DEBRIDEMENT EXTREMITY;  Surgeon: Newt Minion, MD;  Location: [facility];  Service: Orthopedics;  Laterality: Left;  Irrigation and Debridement Left Foot   I & D EXTREMITY  09/18/2012   Procedure: IRRIGATION AND DEBRIDEMENT EXTREMITY;  Surgeon: Newt Minion, MD;  Location: [facility];  Service: Orthopedics;  Laterality: Left;   IR PERC TUN PERIT CATH WO PORT S&I /IMAG  08/30/2020   IR REMOVAL TUN CV CATH W/O FL  04/17/2021   IR US GUIDE VASC ACCESS RIGHT  08/30/2020   LIGATION OF ARTERIOVENOUS  FISTULA Left 12/05/2020   Procedure: LIGATION OF ARTERIOVENOUS  FISTULA LEFT ARM;  Surgeon: Angelia Mould, MD;  Location: [facility];  Service: Vascular;  Laterality: Left;   MENISCUS REPAIR Left 04/2016   Orthopedist, Dr [name] in [place], [place]    Current Medications: Current Meds  Medication Sig   acetaminophen (  TYLENOL) 325 MG tablet Take 650 mg by mouth every 6 (six) hours as needed for mild pain or headache.    calcium acetate (PHOSLO) 667 MG capsule Take 2,001 mg by mouth 3 (three) times daily.   calcium carbonate (TUMS - DOSED IN MG ELEMENTAL CALCIUM) 500 MG chewable tablet Chew 1 tablet by mouth daily.   carvedilol (COREG) 12.5 MG tablet Take by mouth.   doxycycline (VIBRA-TABS) 100 MG tablet Take 1 tablet (100 mg total) by mouth 2 (two) times daily.   lidocaine-prilocaine (EMLA) cream Apply 1 application. topically daily as needed (prior to port being accessed for dialysis).   Na  Sulfate-K Sulfate-Mg Sulf (SUPREP BOWEL PREP KIT) 17.5-3.13-1.6 GM/177ML SOLN Take 1 kit by mouth as directed.   [DISCONTINUED] amLODipine (NORVASC) 10 MG tablet Take 1 tablet (10 mg total) by mouth at bedtime.     Allergies:   Latex, Ace inhibitors, and Wound dressing adhesive   Social History   Socioeconomic History   Marital status: Married    Spouse name: Not on file   Number of children: Not on file   Years of education: Not on file   Highest education level: Not on file  Occupational History   Not on file  Tobacco Use   Smoking status: Former    Types: Cigarettes    Quit date: 09/03/2012    Years since quitting: 10.2   Smokeless tobacco: Former    Quit date: 09/03/2012  Vaping Use   Vaping Use: Never used  Substance and Sexual Activity   Alcohol use: Not Currently   Drug use: Not Currently   Sexual activity: Yes  Other Topics Concern   Not on file  Social History Narrative   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    Social Determinants of Health   Financial Resource Strain: Not on file  Food Insecurity: Not on file  Transportation Needs: Not on file  Physical Activity: Not on file  Stress: Not on file  Social Connections: Not on file     Family History: The patient's family history includes Arthritis in his mother; Diabetes in his father and mother; Diabetes type II in his mother and sister; Hyperlipidemia in his mother; Hypertension in his mother. There is no history of Colon cancer, Stomach cancer, Esophageal cancer, or Pancreatic cancer.  ROS:   Please see the history of present illness.     All other systems reviewed and are negative.  EKGs/Labs/Other Studies Reviewed:    The following studies were reviewed today:   EKG:   11/18/2022: Normal sinus rhythm, rate 62, left axis deviation, poor R wave progression, Q waves in leads III, aVF  Recent Labs: No results found for requested labs within last 365 days.  Recent  Lipid Panel    Component Value Date/Time   CHOL 103 11/18/2022 1541   TRIG 82 11/18/2022 1541   HDL 47 11/18/2022 1541   CHOLHDL 2.2 11/18/2022 1541   CHOLHDL 7.2 (H) 05/27/2016 1226   VLDL NOT CALC 05/27/2016 1226   LDLCALC 39 11/18/2022 1541    Physical Exam:    VS:  BP (!) 172/68 (BP Location: Left Arm, Patient Position: Sitting, Cuff Size: Normal)   Pulse 62   Ht 5' 10"$  (1.778 m)   Wt 196 lb 3.2 oz (89 kg)   SpO2 98%   BMI 28.15 kg/m     Wt Readings from Last 3 Encounters:  11/18/22 196 lb 3.2 oz (89 kg)  01/02/22 196 lb 2 oz (89 kg)  01/01/22 198 lb (89.8 kg)     GEN:  Well nourished, well developed in no acute distress HEENT: Normal NECK: No JVD; No carotid bruits LYMPHATICS: No lymphadenopathy CARDIAC: RRR, no murmurs, rubs, gallops RESPIRATORY:  Clear to auscultation without rales, wheezing or rhonchi  ABDOMEN: Soft, non-tender, non-distended MUSCULOSKELETAL:  No edema; No deformity  SKIN: Warm and dry NEUROLOGIC:  Alert and oriented x 3 PSYCHIATRIC:  Normal affect   ASSESSMENT:    1. Palpitations   2. Nonspecific abnormal electrocardiogram (ECG) (EKG)   3. Coronary artery disease involving native coronary artery of native heart without angina pectoris   4. Essential hypertension   5. Hyperlipidemia, unspecified hyperlipidemia type    PLAN:    Irregular heart rhythm: Recommend Zio patch x 2 weeks to evaluate for arrhythmia  CAD: Had positive stress test as part of transplant evaluation and underwent LHC 05/16/2022, which showed 40% mid LAD stenosis, 70% ostial D1, 99% D2 (small diffusely disease), diffusely diseased apical LAD but very small, 50% mid LCx, 100% OM1 with L-L collaterals, 100% OM 2 with L-L collaterals, 30% RCA, 60% RPL.  Echo 01/2022 showed normal LV function -Continue aspirin, statin.  He is not sure what medications he is taking.  Asked him to call when he gets home and let us know his medications -Inferior Q waves on EKG, will check  echocardiogram  Hypertension: Reported to be taking Amlodipine 10 mg daily and carvedilol 12.5 mg twice daily and irbesartan 150 mg daily and hydralazine 25 mg 3 times daily, but he is unsure of meds.  He will let us know what he is taking  ESRD: On HD  Hyperlipidemia: Supposed be on rosuvastatin but he is unsure if he is taking.  Asked him to call when he gets home and let us know his medications.  Check lipid panel  RTC in 3 months  Medication Adjustments/Labs and Tests Ordered: Current medicines are reviewed at length with the patient today.  Concerns regarding medicines are outlined above.  Orders Placed This Encounter  Procedures   Lipid panel   LONG TERM MONITOR (3-14 DAYS)   EKG 12-Lead   ECHOCARDIOGRAM COMPLETE   No orders of the defined types were placed in this encounter.   Patient Instructions  Medication Instructions:   CALL WITH MEDICATIONS  *If you need a refill on your cardiac medications before your next appointment, please call your pharmacy*   Testing/Procedures:  Your physician has requested that you have an echocardiogram. Echocardiography is a painless test that uses sound waves to create images of your heart. It provides your doctor with information about the size and shape of your heart and how well your heart's chambers and valves are working. This procedure takes approximately one hour. There are no restrictions for this procedure. Please do NOT wear cologne, perfume, aftershave, or lotions (deodorant is allowed). Please arrive 15 minutes prior to your appointment time. [place] Instructions  Your physician has requested you wear a ZIO patch monitor for 14 days.  This is a single patch monitor. [employer] supplies one patch monitor per enrollment. Additional stickers are not available. Please do not apply patch if you will be having a Nuclear Stress Test,  Echocardiogram, Cardiac CT, MRI, or Chest Xray  during the period you would be wearing the  monitor. The patch cannot be worn during these tests. You cannot remove and re-apply the  ZIO XT patch monitor.  Your ZIO patch monitor will be mailed 3 day USPS to your address on file. It may take 3-5 days  to receive your monitor after you have been enrolled.  Once you have received your monitor, please review the enclosed instructions. Your monitor  has already been registered assigning a specific monitor serial # to you.  Billing and Patient Assistance Program Information  We have supplied [employer] with any of your insurance information on file for billing purposes. [employer] offers a sliding scale Patient Assistance Program for patients that do not have  insurance, or whose insurance does not completely cover the cost of the ZIO monitor.  You must apply for the Patient Assistance Program to qualify for this discounted rate.  To apply, please call [employer] at [phone], select option 4, select option 2, ask to apply for  Patient Assistance Program. Theodore Demark will ask your household income, and how many people  are in your household. They will quote your out-of-pocket cost based on that information.  [employer] will also be able to set up a 26-month interest-free payment plan if needed.  Applying the monitor   Shave hair from upper left chest.  Hold abrader disc by orange tab. Rub abrader in 40 strokes over the upper left chest as  indicated in your monitor instructions.  Clean area with 4 enclosed alcohol pads. Let dry.  Apply patch as indicated in monitor instructions. Patch will be placed under collarbone on left  side of chest with arrow pointing upward.  Rub patch adhesive wings for 2 minutes. Remove white label marked "1". Remove the white  label marked "2". Rub patch adhesive wings for 2 additional minutes.  While looking in a mirror, press and release button in center of patch. A small green light will  flash 3-4 times. This will be  your only indicator that the monitor has been turned on.  Do not shower for the first 24 hours. You may shower after the first 24 hours.  Press the button if you feel a symptom. You will hear a small click. Record Date, Time and  Symptom in the Patient Logbook.  When you are ready to remove the patch, follow instructions on the last 2 pages of Patient  Logbook. Stick patch monitor onto the last page of Patient Logbook.  Place Patient Logbook in the blue and white box. Use locking tab on box and tape box closed  securely. The blue and white box has prepaid postage on it. Please place it in the mailbox as  soon as possible. Your physician should have your test results approximately 7 days after the  [facility]  Call [employer] [phone] you have questions regarding  your ZIO XT patch monitor. Call them immediately if you see an orange light blinking on your  monitor.  If your monitor falls off in less than 4 days, contact our Monitor department at [phone]  If your monitor becomes loose or falls off after 4 days call [employer] at [phone]  suggestions on securing your monitor    Follow-Up: [facility] you and your health needs are our priority.  As part of our continuing mission to provide you with exceptional heart care, we have created designated Provider Care Teams.  These Care Teams include your primary Cardiologist (physician) and Advanced Practice Providers (APPs -  Physician Assistants and Nurse Practitioners) who all work together to provide you with  the care you need, when you need it.  We recommend signing up for the patient portal called "MyChart".  Sign up information is provided on this After Visit Summary.  MyChart is used to connect with patients for Virtual Visits (Telemedicine).  Patients are able to view lab/test results, encounter notes, upcoming appointments, etc.  Non-urgent messages  can be sent to your provider as well.   To learn more about what you can do with MyChart, go to [email or web address].    Your next appointment:   3 month(s)  Provider:   Oswaldo Milian MD      Signed, Earl Heinz, MD  11/19/2022 5:27 PM    [facility]

## 2022-11-18 ENCOUNTER — Ambulatory Visit: Payer: Commercial Managed Care - HMO | Attending: Cardiology | Admitting: Cardiology

## 2022-11-18 ENCOUNTER — Ambulatory Visit: Payer: Commercial Managed Care - HMO

## 2022-11-18 ENCOUNTER — Encounter: Payer: Self-pay | Admitting: Cardiology

## 2022-11-18 VITALS — BP 172/68 | HR 62 | Ht 70.0 in | Wt 196.2 lb

## 2022-11-18 DIAGNOSIS — I2510 Atherosclerotic heart disease of native coronary artery without angina pectoris: Secondary | ICD-10-CM

## 2022-11-18 DIAGNOSIS — I10 Essential (primary) hypertension: Secondary | ICD-10-CM

## 2022-11-18 DIAGNOSIS — R002 Palpitations: Secondary | ICD-10-CM | POA: Diagnosis not present

## 2022-11-18 DIAGNOSIS — R9431 Abnormal electrocardiogram [ECG] [EKG]: Secondary | ICD-10-CM

## 2022-11-18 DIAGNOSIS — E785 Hyperlipidemia, unspecified: Secondary | ICD-10-CM

## 2022-11-18 NOTE — Progress Notes (Unsigned)
Enrolled for [employer] to mail a ZIO XT long term holter monitor to the patients address on file.  

## 2022-11-18 NOTE — Patient Instructions (Signed)
Medication Instructions:   CALL WITH MEDICATIONS  *If you need a refill on your cardiac medications before your next appointment, please call your pharmacy*   Testing/Procedures:  Your physician has requested that you have an echocardiogram. Echocardiography is a painless test that uses sound waves to create images of your heart. It provides your doctor with information about the size and shape of your heart and how well your heart's chambers and valves are working. This procedure takes approximately one hour. There are no restrictions for this procedure. Please do NOT wear cologne, perfume, aftershave, or lotions (deodorant is allowed). Please arrive 15 minutes prior to your appointment time. [place] Instructions  Your physician has requested you wear a ZIO patch monitor for 14 days.  This is a single patch monitor. [employer] supplies one patch monitor per enrollment. Additional stickers are not available. Please do not apply patch if you will be having a Nuclear Stress Test,  Echocardiogram, Cardiac CT, MRI, or Chest Xray during the period you would be wearing the  monitor. The patch cannot be worn during these tests. You cannot remove and re-apply the  ZIO XT patch monitor.  Your ZIO patch monitor will be mailed 3 day USPS to your address on file. It may take 3-5 days  to receive your monitor after you have been enrolled.  Once you have received your monitor, please review the enclosed instructions. Your monitor  has already been registered assigning a specific monitor serial # to you.  Billing and Patient Assistance Program Information  We have supplied [employer] with any of your insurance information on file for billing purposes. [employer] offers a sliding scale Patient Assistance Program for patients that do not have  insurance, or whose insurance does not completely cover the cost of the ZIO monitor.  You must apply for the Patient  Assistance Program to qualify for this discounted rate.  To apply, please call [employer] at [phone], select option 4, select option 2, ask to apply for  Patient Assistance Program. Theodore Demark will ask your household income, and how many people  are in your household. They will quote your out-of-pocket cost based on that information.  [employer] will also be able to set up a 74-month interest-free payment plan if needed.  Applying the monitor   Shave hair from upper left chest.  Hold abrader disc by orange tab. Rub abrader in 40 strokes over the upper left chest as  indicated in your monitor instructions.  Clean area with 4 enclosed alcohol pads. Let dry.  Apply patch as indicated in monitor instructions. Patch will be placed under collarbone on left  side of chest with arrow pointing upward.  Rub patch adhesive wings for 2 minutes. Remove white label marked "1". Remove the white  label marked "2". Rub patch adhesive wings for 2 additional minutes.  While looking in a mirror, press and release button in center of patch. A small green light will  flash 3-4 times. This will be your only indicator that the monitor has been turned on.  Do not shower for the first 24 hours. You may shower after the first 24 hours.  Press the button if you feel a symptom. You will hear a small click. Record Date, Time and  Symptom in the Patient Logbook.  When you are ready to remove the patch, follow instructions on the last 2 pages of Patient  Logbook. Stick patch monitor onto the last page  of Patient Logbook.  Place Patient Logbook in the blue and white box. Use locking tab on box and tape box closed  securely. The blue and white box has prepaid postage on it. Please place it in the mailbox as  soon as possible. Your physician should have your test results approximately 7 days after the  monitor has been mailed back to [facility].  Call [place] at [phone] if you have questions  regarding  your ZIO XT patch monitor. Call them immediately if you see an orange light blinking on your  monitor.  If your monitor falls off in less than 4 days, contact our Monitor department at [phone].  If your monitor becomes loose or falls off after 4 days call [employer] at [phone] for  suggestions on securing your monitor    Follow-Up: At [facility], you and your health needs are our priority.  As part of our continuing mission to provide you with exceptional heart care, we have created designated Provider Care Teams.  These Care Teams include your primary Cardiologist (physician) and Advanced Practice Providers (APPs -  Physician Assistants and Nurse Practitioners) who all work together to provide you with the care you need, when you need it.  We recommend signing up for the patient portal called "MyChart".  Sign up information is provided on this After Visit Summary.  MyChart is used to connect with patients for Virtual Visits (Telemedicine).  Patients are able to view lab/test results, encounter notes, upcoming appointments, etc.  Non-urgent messages can be sent to your provider as well.   To learn more about what you can do with MyChart, go to [email or web address].    Your next appointment:   3 month(s)  Provider:   Oswaldo Milian MD

## 2022-11-19 ENCOUNTER — Telehealth: Payer: Self-pay | Admitting: Cardiology

## 2022-11-19 LAB — LIPID PANEL
Chol/HDL Ratio: 2.2 ratio (ref 0.0–5.0)
Cholesterol, Total: 103 mg/dL (ref 100–199)
HDL: 47 mg/dL (ref >39)
LDL Chol Calc (NIH): 39 mg/dL (ref 0–99)
Triglycerides: 82 mg/dL (ref 0–149)
VLDL Cholesterol Cal: 17 mg/dL (ref 5–40)

## 2022-11-19 NOTE — Telephone Encounter (Signed)
Spoke with pt who gave an update of his medications: Irbesartan 375m daily Hydralazine 259mthree times daily Carvedilol 2542mwice daily ASA 59m67mily Rosuvastatin 20mg38mly  Shall I update his med list with these meds?

## 2022-11-19 NOTE — Telephone Encounter (Signed)
Pt would like a callback from nurse regarding medication list. Please advise

## 2022-11-19 NOTE — Telephone Encounter (Signed)
Med list updated

## 2022-11-26 ENCOUNTER — Telehealth: Payer: Self-pay | Admitting: Cardiology

## 2022-11-26 NOTE — Telephone Encounter (Signed)
Patient calling because he has on the heart monitor but he is allergic to the tape. Please advise

## 2022-11-26 NOTE — Telephone Encounter (Signed)
Spoke with pt, aware he will need to call the company. He reports he took the monitor off and has mailed it back to the company. He reports he only wore the monitor for 12 hours. Will make dr Gardiner Rhyme aware.

## 2022-11-29 ENCOUNTER — Ambulatory Visit (INDEPENDENT_AMBULATORY_CARE_PROVIDER_SITE_OTHER): Payer: Commercial Managed Care - HMO | Admitting: Family Medicine

## 2022-11-29 VITALS — BP 173/65 | HR 56 | Ht 71.0 in | Wt 196.2 lb

## 2022-11-29 DIAGNOSIS — H9201 Otalgia, right ear: Secondary | ICD-10-CM

## 2022-11-29 NOTE — Progress Notes (Signed)
    SUBJECTIVE:   CHIEF COMPLAINT / HPI:   Right ear pain - has been off and on for a few weeks - Last night used his shower head to try and clean his ear followed by a Q-tip and then felt an odd sensation and now is having issues with hearing   PERTINENT  PMH / PSH: Reviewed  OBJECTIVE:   BP (!) 162/65   Pulse (!) 56   Ht 5' 11"$  (1.803 m)   Wt 196 lb 4 oz (89 kg)   SpO2 100%   BMI 27.37 kg/m   General: NAD, well-appearing, well-nourished Respiratory: No respiratory distress, breathing comfortably, able to speak in full sentences Skin: warm and dry, no rashes noted on exposed skin Psych: Appropriate affect and mood HEENT: Left TM clear, Right TM unable to be generalized (unclear if visualizing abnormal TM or earwax occlusion) but patient was unable to tolerate examination due to persistent coughing during evaluation  ASSESSMENT/PLAN:   Right Ear Pain Present after trauma to the ear/eardrum with subsequent decreased hearing.  Unable to do good visual assessment of the right TM for perforation, discussed with patient and we will refer to ENT urgently.  Instructed to not insert anything including water or foreign objects into his ear. - ENT referral placed   Rise Patience, [place]

## 2022-11-29 NOTE — Patient Instructions (Signed)
I am placing a referral to the ear/nose/throat doctor to get an evaluation of your ear.  Please make sure not to put anything in the ear or try to clean the ear.  If you start having worsening of the symptoms or anything like fever or extreme pain over the weekend then please go to the ER.

## 2022-12-04 ENCOUNTER — Telehealth: Payer: Self-pay | Admitting: *Deleted

## 2022-12-04 NOTE — Telephone Encounter (Signed)
Patient had allergic reaction to ZIO XT monitor and was only able to wear it for 12 hours. [name] at [facility] contacted to cancel charges on monitor. There are other types of monitor that offer electrodes for sensitive skin, however, they will all have some type of adhesive. Order for ZIO XT monitor will be cancelled.

## 2022-12-12 ENCOUNTER — Encounter: Payer: Self-pay | Admitting: Family Medicine

## 2022-12-12 ENCOUNTER — Ambulatory Visit (HOSPITAL_COMMUNITY): Payer: Commercial Managed Care - HMO | Attending: Cardiology

## 2022-12-12 ENCOUNTER — Ambulatory Visit (INDEPENDENT_AMBULATORY_CARE_PROVIDER_SITE_OTHER): Payer: Commercial Managed Care - HMO | Admitting: Family Medicine

## 2022-12-12 VITALS — BP 172/67 | HR 57 | Ht 71.0 in | Wt 197.8 lb

## 2022-12-12 DIAGNOSIS — Z01818 Encounter for other preprocedural examination: Secondary | ICD-10-CM

## 2022-12-12 DIAGNOSIS — R9431 Abnormal electrocardiogram [ECG] [EKG]: Secondary | ICD-10-CM | POA: Diagnosis not present

## 2022-12-12 DIAGNOSIS — E785 Hyperlipidemia, unspecified: Secondary | ICD-10-CM

## 2022-12-12 DIAGNOSIS — Z8639 Personal history of other endocrine, nutritional and metabolic disease: Secondary | ICD-10-CM | POA: Diagnosis not present

## 2022-12-12 DIAGNOSIS — I150 Renovascular hypertension: Secondary | ICD-10-CM

## 2022-12-12 LAB — ECHOCARDIOGRAM COMPLETE
Area-P 1/2: 3.39 cm2
S' Lateral: 3.8 cm

## 2022-12-12 NOTE — Patient Instructions (Addendum)
It was great to see you!  I will send today's visit notes to your dialysis center and the transplant team.  Please discuss your blood pressure with your nephrologist and/or cardiologist.  Take care, Dr Rock Nephew

## 2022-12-12 NOTE — Progress Notes (Signed)
    SUBJECTIVE:   CHIEF COMPLAINT / HPI:   ESRD -on dialysis MWF -in the process of being evaluated for renal transplant -needs PCP physical for pre-transplant eval  HTN -being managed by his nephrologist -excellent compliance with home amlodipine '10mg'$  daily, carvedilol '25mg'$  BID, hydralazine '25mg'$  TID and irbesartan '300mg'$  daily -BP runs high, usually [id number] systolic at HD -tried hydralazine '50mg'$  TID but did not tolerate higher dose  H/o Diabetes Not on any medications since 2021. A1c's have remained <6.0% off meds  PERTINENT  PMH / PSH: CAD (follows regularly with cardiology)  OBJECTIVE:   BP (!) 172/67   Pulse (!) 57   Ht '5\' 11"'$  (1.803 m)   Wt 197 lb 12.8 oz (89.7 kg)   SpO2 100%   BMI 27.59 kg/m   Gen: NAD, pleasant, able to participate in exam HEENT: /AT, PERRLA, nares patent bilaterally, TM normal bilaterally, no maxillary teeth (surgically removed), oropharynx otherwise unremarkable Neck: supple, no cervical or supraclavicular lymphadenopathy, thyroid smooth and normal in size CV: RRR, normal S1/S2 Resp: Normal effort, lungs CTAB GI: abdomen soft, non-tender, non-distended Extremities: no edema or cyanosis; RUE fistula well-appearing Skin: warm and dry, no rashes noted Neuro: alert, no obvious focal deficits Psych: Normal affect and mood   ASSESSMENT/PLAN:   History of diabetes mellitus A1c's have been <6.0% off medication for several years. Well controlled CBG readings. No longer meets diagnostic criteria for diabetes. Likely due to his ESRD. If patient receives renal transplant will need close monitoring for resurgence of diabetes.  Hypertension BP elevated today. Was unable to tolerate higher dose of hydralazine previously. Maxed out on remainder of medications. This is managed by his nephrologist. If persistently elevated, could consider re-trial of hydralazine '50mg'$  vs addition of clonidine. Hopeful this would improve with renal  transplant  Hyperlipidemia Well controlled on rosuvastatin '20mg'$  daily  Encounter for pre-transplant evaluation for kidney transplant Will fax visit notes to his dialysis center and transplant team per patient request. Hopefully he will be approved   Alcus Dad, MD [place]

## 2022-12-13 DIAGNOSIS — Z01818 Encounter for other preprocedural examination: Secondary | ICD-10-CM | POA: Insufficient documentation

## 2022-12-13 NOTE — Assessment & Plan Note (Signed)
Will fax visit notes to his dialysis center and transplant team per patient request. Hopefully he will be approved

## 2022-12-13 NOTE — Assessment & Plan Note (Addendum)
BP elevated today. Was unable to tolerate higher dose of hydralazine previously. Maxed out on remainder of medications. This is managed by his nephrologist. If persistently elevated, could consider re-trial of hydralazine '50mg'$  vs addition of clonidine. Hopeful this would improve with renal transplant

## 2022-12-13 NOTE — Assessment & Plan Note (Signed)
Well controlled on rosuvastatin '20mg'$  daily

## 2022-12-13 NOTE — Assessment & Plan Note (Addendum)
A1c's have been <6.0% off medication for several years. Well controlled CBG readings. No longer meets diagnostic criteria for diabetes. Likely due to his ESRD. If patient receives renal transplant will need close monitoring for resurgence of diabetes.

## 2022-12-26 ENCOUNTER — Telehealth: Payer: Self-pay | Admitting: Cardiology

## 2022-12-26 NOTE — Telephone Encounter (Signed)
Pt was returning nurses call in regards to discussing monitor. Please advise.

## 2022-12-26 NOTE — Telephone Encounter (Signed)
Spoke with patient regarding monitor.  He states he had such a bad reaction that his chest was swollen, itching, purulent drainage.  He is allergic to adhesive and this was the worst reaction he's ever had.  He is aware of his Echo results.  Clarification on medication he does take the amlodipine 10 mg daily, carvedilol 25 mg Twice daily, and Hydralazine 25 mg Three times Daily.  He does NOT take the irbesartan 300 mg that is listed in his chart. If any further questions please reach out to patient

## 2023-01-30 ENCOUNTER — Ambulatory Visit: Payer: Commercial Managed Care - HMO | Admitting: Podiatry

## 2023-02-06 ENCOUNTER — Ambulatory Visit (INDEPENDENT_AMBULATORY_CARE_PROVIDER_SITE_OTHER): Payer: Commercial Managed Care - HMO | Admitting: Podiatry

## 2023-02-06 DIAGNOSIS — Q828 Other specified congenital malformations of skin: Secondary | ICD-10-CM | POA: Diagnosis not present

## 2023-02-06 NOTE — Progress Notes (Signed)
Subjective:  Patient ID: [name], male    DOB: 02-01-1966,  MRN: [id number]  Chief Complaint  Patient presents with   Ingrown Toenail    Right hallux ingrown left foot callus     57 y.o. male presents with the above complaint.  Patient presents with left submetatarsal 4 porokeratotic lesion painful to touch is progressive gotten worse worse with ambulation hurts with pressure he is a diabetic.  He has not seen anyone else prior to seeing me for this.  Denies any other acute complaints   Review of Systems: Negative except as noted in the HPI. Denies N/V/F/Ch.  Past Medical History:  Diagnosis Date   Allergy    Diabetes mellitus    Type 2 DM, Patient reports DM Type 1 diagnosd age at age 5   Diabetic foot ulcers (HCC)    ESRD (end stage renal disease) (HCC)    [facility]   Foot callus 07/01/2018   Foreign body in left foot    with  infection   Hypertension    Meniscus tear    Multiple rib fractures    Personal history of diabetic foot ulcer, Left Foot    Type 2 diabetes mellitus with stage 4 chronic kidney disease, without long-term current use of insulin (HCC) 06/01/2012    Current Outpatient Medications:    acetaminophen (TYLENOL) 325 MG tablet, Take 650 mg by mouth every 6 (six) hours as needed for mild pain or headache. , Disp: , Rfl:    amLODipine (NORVASC) 10 MG tablet, Take 10 mg by mouth daily., Disp: , Rfl:    aspirin EC 81 MG tablet, Take 81 mg by mouth daily. Swallow whole., Disp: , Rfl:    calcium acetate (PHOSLO) 667 MG capsule, Take 2,001 mg by mouth 3 (three) times daily., Disp: , Rfl:    calcium carbonate (TUMS - DOSED IN MG ELEMENTAL CALCIUM) 500 MG chewable tablet, Chew 1 tablet by mouth daily., Disp: , Rfl:    carvedilol (COREG) 25 MG tablet, Take 25 mg by mouth 2 (two) times daily with a meal., Disp: , Rfl:    hydrALAZINE (APRESOLINE) 25 MG tablet, Take 25 mg by mouth 3 (three) times daily., Disp: , Rfl:    irbesartan (AVAPRO) 300 MG  tablet, Take 300 mg by mouth daily., Disp: , Rfl:    lidocaine-prilocaine (EMLA) cream, Apply 1 application. topically daily as needed (prior to port being accessed for dialysis)., Disp: , Rfl:    rosuvastatin (CRESTOR) 20 MG tablet, Take 20 mg by mouth daily., Disp: , Rfl:   Social History   Tobacco Use  Smoking Status Former   Types: Cigarettes   Quit date: 09/03/2012   Years since quitting: 10.4  Smokeless Tobacco Former   Quit date: 09/03/2012    Allergies  Allergen Reactions   Latex Rash and Other (See Comments)    Pt states he is allergic to latex condoms   Ace Inhibitors Cough   Wound Dressing Adhesive Itching and Rash    Allergy to tape adhesive   Objective:  There were no vitals filed for this visit. There is no height or weight on file to calculate BMI. Constitutional Well developed. Well nourished.  Vascular Dorsalis pedis pulses palpable bilaterally. Posterior tibial pulses palpable bilaterally. Capillary refill normal to all digits.  No cyanosis or clubbing noted. Pedal hair growth normal.  Neurologic Normal speech. Oriented to person, place, and time. Epicritic sensation to light touch grossly present bilaterally.  Dermatologic Left submetatarsal  4 porokeratotic lesion with central nucleated core noted.  No pinpoint bleeding noted upon debridement  Orthopedic: Normal joint ROM without pain or crepitus bilaterally. No visible deformities. No bony tenderness.   Radiographs: None Assessment:   1. Porokeratosis    Plan:  Patient was evaluated and treated and all questions answered.  Left submetatarsal 4 porokeratosis -All questions and concerns were discussed with the patient in extensive detail -Given the amount of pain that he is having using chisel blade and a handle as a courtesy the lesion was debrided down to healthy dry tissue.  No complication and no pinpoint bleeding noted Will discuss orthotics option during next visit to offload  No  follow-ups on file.

## 2023-02-27 ENCOUNTER — Encounter: Payer: Self-pay | Admitting: Cardiology

## 2023-02-27 ENCOUNTER — Ambulatory Visit: Payer: Commercial Managed Care - HMO | Attending: Cardiology | Admitting: Cardiology

## 2023-02-27 VITALS — BP 148/72 | HR 59 | Ht 70.5 in | Wt 201.4 lb

## 2023-02-27 DIAGNOSIS — I2510 Atherosclerotic heart disease of native coronary artery without angina pectoris: Secondary | ICD-10-CM | POA: Diagnosis not present

## 2023-02-27 DIAGNOSIS — E785 Hyperlipidemia, unspecified: Secondary | ICD-10-CM | POA: Diagnosis not present

## 2023-02-27 DIAGNOSIS — R002 Palpitations: Secondary | ICD-10-CM | POA: Diagnosis not present

## 2023-02-27 DIAGNOSIS — N185 Chronic kidney disease, stage 5: Secondary | ICD-10-CM

## 2023-02-27 NOTE — Patient Instructions (Signed)
Medication Instructions:  Your physician recommends that you continue on your current medications as directed. Please refer to the Current Medication list given to you today.  *If you need a refill on your cardiac medications before your next appointment, please call your pharmacy*  Follow-Up: At [facility], you and your health needs are our priority.  As part of our continuing mission to provide you with exceptional heart care, we have created designated Provider Care Teams.  These Care Teams include your primary Cardiologist (physician) and Advanced Practice Providers (APPs -  Physician Assistants and Nurse Practitioners) who all work together to provide you with the care you need, when you need it.  We recommend signing up for the patient portal called "MyChart".  Sign up information is provided on this After Visit Summary.  MyChart is used to connect with patients for Virtual Visits (Telemedicine).  Patients are able to view lab/test results, encounter notes, upcoming appointments, etc.  Non-urgent messages can be sent to your provider as well.   To learn more about what you can do with MyChart, go to [email or web address].    Your next appointment:   6 month(s)  Provider:   Little Ishikawa, MD     Other Instructions You can look into the [facility] device by [employer]. This device is purchased by you and it connects to an application you download to your smart phone.  It can detect abnormal heart rhythms and alert you to contact your doctor for further evaluation. The web site is:  [email or web address]

## 2023-02-27 NOTE — Progress Notes (Signed)
Cardiology Office Note:    Date:  02/27/2023   ID:  [name], DOB 1965-10-15, MRN [id number]  PCP:  Maury Dus, MD  Cardiologist:  Little Ishikawa, MD  Electrophysiologist:  None   Referring MD: Maury Dus, MD   No chief complaint on file.   History of Present Illness:    [name] is a 57 y.o. male with a hx of CAD, ESRD, T2DM, hypertension who presents for follow-up.  He was referred by Dr. Anner Crete for evaluation of irregular heart rhythm, initially seen 11/18/2022.  Reports was told at dialysis heart rhythm was irregular.  Denies any chest pain, dyspnea, lightheadedness, syncope, lower extremity edema, or palpitations.  Walks his dog daily for several bocks.  Reports occasionally smoked cigarettes when he drank alcohol but quit years ago.  Family history includes sister and mother have A-fib  Had positive stress test as part of transplant evaluation and underwent LHC 05/16/2022, which showed 40% mid LAD stenosis, 70% ostial D1, 99% D2 (small diffusely disease), diffusely diseased apical LAD but very small, 50% mid LCx, 100% OM1 with L-L collaterals, 100% OM 2 with L-L collaterals, 30% RCA, 60% RPL.  Echocardiogram 12/12/2022 showed EF 60 to 65%, severe LVH, grade 2 diastolic dysfunction, normal RV function, severe left atrial enlargement, moderate right atrial enlargement, no significant valvular disease, dilated aortic root measuring 43 mm.  Since last clinic visit, he reports he is doing okay.  Had rash with wearing cardiac monitor.  Does report intermittent palpitations.  Also reports right-sided chest pain that he describes as dull aching pain.  Reports has been having night sweats.  Denies any dyspnea, lightheadedness, syncope, or lower extremity edema.   Past Medical History:  Diagnosis Date   Allergy    Diabetes mellitus    Type 2 DM, Patient reports DM Type 1 diagnosd age at age 36   Diabetic foot ulcers (HCC)    ESRD (end stage renal disease)  (HCC)    [facility]   Foot callus 07/01/2018   Foreign body in left foot    with  infection   Hypertension    Meniscus tear    Multiple rib fractures    Personal history of diabetic foot ulcer, Left Foot    Type 2 diabetes mellitus with stage 4 chronic kidney disease, without long-term current use of insulin (HCC) 06/01/2012    Past Surgical History:  Procedure Laterality Date   A/V FISTULAGRAM Right 03/26/2021   Procedure: A/V FISTULAGRAM;  Surgeon: Maeola Harman, MD;  Location: [facility] INVASIVE CV LAB;  Service: Cardiovascular;  Laterality: Right;   AV FISTULA PLACEMENT Left 11/06/2020   Procedure: LEFT ARM FIRST STAGE BRACHIAL BASILIC ARTERIOVENOUS (AV) FISTULA CREATION;  Surgeon: Cephus Shelling, MD;  Location: [facility] OR;  Service: Vascular;  Laterality: Left;   AV FISTULA PLACEMENT Right 12/05/2020   Procedure: RADIOCEPHALIC ARTERIOVENOUS (AV) FISTULA CREATION RIGHT ARM;  Surgeon: Chuck Hint, MD;  Location: [facility] OR;  Service: Vascular;  Laterality: Right;   EYE SURGERY Bilateral    lazer   I & D EXTREMITY  04/23/2012   Procedure: IRRIGATION AND DEBRIDEMENT EXTREMITY;  Surgeon: Nadara Mustard, MD;  Location: [facility] OR;  Service: Orthopedics;  Laterality: Left;  Irrigation and Debridement Left Foot   I & D EXTREMITY  09/18/2012   Procedure: IRRIGATION AND DEBRIDEMENT EXTREMITY;  Surgeon: Nadara Mustard, MD;  Location: [facility] OR;  Service: Orthopedics;  Laterality: Left;   IR PERC TUN PERIT CATH  WO PORT S&I /IMAG  08/30/2020   IR REMOVAL TUN CV CATH W/O FL  04/17/2021   IR US GUIDE VASC ACCESS RIGHT  08/30/2020   LIGATION OF ARTERIOVENOUS  FISTULA Left 12/05/2020   Procedure: LIGATION OF ARTERIOVENOUS  FISTULA LEFT ARM;  Surgeon: Chuck Hint, MD;  Location: [facility] OR;  Service: Vascular;  Laterality: Left;   MENISCUS REPAIR Left 04/2016   Orthopedist, Dr Yisroel Ramming in [place], [place]    Current Medications: Current Meds  Medication Sig   acetaminophen (TYLENOL)  325 MG tablet Take 650 mg by mouth every 6 (six) hours as needed for mild pain or headache.    amLODipine (NORVASC) 10 MG tablet Take 10 mg by mouth daily.   aspirin EC 81 MG tablet Take 81 mg by mouth daily. Swallow whole.   calcium acetate (PHOSLO) 667 MG capsule Take 2,001 mg by mouth 3 (three) times daily.   carvedilol (COREG) 25 MG tablet Take 25 mg by mouth 2 (two) times daily with a meal.   hydrALAZINE (APRESOLINE) 25 MG tablet Take 25 mg by mouth 3 (three) times daily.   irbesartan (AVAPRO) 300 MG tablet Take 300 mg by mouth daily.   lidocaine-prilocaine (EMLA) cream Apply 1 application. topically daily as needed (prior to port being accessed for dialysis).   rosuvastatin (CRESTOR) 20 MG tablet Take 20 mg by mouth daily.     Allergies:   Latex, Ace inhibitors, and Wound dressing adhesive   Social History   Socioeconomic History   Marital status: Married    Spouse name: Not on file   Number of children: Not on file   Years of education: Not on file   Highest education level: Not on file  Occupational History   Not on file  Tobacco Use   Smoking status: Former    Types: Cigarettes    Quit date: 09/03/2012    Years since quitting: 10.4   Smokeless tobacco: Former    Quit date: 09/03/2012  Vaping Use   Vaping Use: Never used  Substance and Sexual Activity   Alcohol use: Not Currently   Drug use: Not Currently   Sexual activity: Yes  Other Topics Concern   Not on file  Social History Narrative   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    Social Determinants of Health   Financial Resource Strain: Not on file  Food Insecurity: Not on file  Transportation Needs: Not on file  Physical Activity: Not on file  Stress: Not on file  Social Connections: Not on file     Family History: The patient's family history includes Arthritis in his mother; Diabetes in his father and mother; Diabetes type II in his mother and sister; Hyperlipidemia in  his mother; Hypertension in his mother. There is no history of Colon cancer, Stomach cancer, Esophageal cancer, or Pancreatic cancer.  ROS:   Please see the history of present illness.     All other systems reviewed and are negative.  EKGs/Labs/Other Studies Reviewed:    The following studies were reviewed today:   EKG:   11/18/2022: Normal sinus rhythm, rate 62, left axis deviation, poor R wave progression, Q waves in leads III, aVF  Recent Labs: No results found for requested labs within last 365 days.  Recent Lipid Panel    Component Value Date/Time   CHOL 103 11/18/2022 1541   TRIG 82 11/18/2022 1541   HDL 47 11/18/2022 1541   CHOLHDL 2.2 11/18/2022 1541  CHOLHDL 7.2 (H) 05/27/2016 1226   VLDL NOT CALC 05/27/2016 1226   LDLCALC 39 11/18/2022 1541    Physical Exam:    VS:  BP (!) 148/72   Pulse (!) 59   Ht 5' 10.5" (1.791 m)   Wt 201 lb 6.4 oz (91.4 kg)   SpO2 96%   BMI 28.49 kg/m     Wt Readings from Last 3 Encounters:  02/27/23 201 lb 6.4 oz (91.4 kg)  12/12/22 197 lb 12.8 oz (89.7 kg)  11/29/22 196 lb 4 oz (89 kg)     GEN:  Well nourished, well developed in no acute distress HEENT: Normal NECK: No JVD; No carotid bruits LYMPHATICS: No lymphadenopathy CARDIAC: RRR, no murmurs, rubs, gallops RESPIRATORY:  Clear to auscultation without rales, wheezing or rhonchi  ABDOMEN: Soft, non-tender, non-distended MUSCULOSKELETAL:  No edema; No deformity  SKIN: Warm and dry NEUROLOGIC:  Alert and oriented x 3 PSYCHIATRIC:  Normal affect   ASSESSMENT:    1. Palpitations   2. Coronary artery disease involving native coronary artery of native heart without angina pectoris   3. Chronic kidney disease, stage 5 (HCC)   4. Hyperlipidemia, unspecified hyperlipidemia type    PLAN:    Irregular heart rhythm: Recommend Zio patch x 2 weeks to evaluate for arrhythmia.  This was ordered at clinic visit 11/2022 but he had allergic reaction to adhesive and was unable to  tolerate.  Discussed Kardia mobile device for further monitoring, he will look into this  CAD: Had positive stress test as part of transplant evaluation and underwent LHC 05/16/2022, which showed 40% mid LAD stenosis, 70% ostial D1, 99% D2 (small diffusely disease), diffusely diseased apical LAD but very small, 50% mid LCx, 100% OM1 with L-L collaterals, 100% OM 2 with L-L collaterals, 30% RCA, 60% RPL.  Echo 01/2022 showed normal LV function.  Echocardiogram 12/12/2022 showed EF 60 to 65%, severe LVH, grade 2 diastolic dysfunction, normal RV function, severe left atrial enlargement, moderate right atrial enlargement, no significant valvular disease, dilated aortic root measuring 43 mm. -Continue aspirin, statin.    Hypertension: On amlodipine 10 mg daily, carvedilol 25 mg twice daily, hydralazine 25 mg 3 times daily, irbesartan 300 mg daily  ESRD: On HD  Hyperlipidemia: On rosuvastatin 20 mg daily.  LDL 39 11/18/2022  RTC in 6 months  Medication Adjustments/Labs and Tests Ordered: Current medicines are reviewed at length with the patient today.  Concerns regarding medicines are outlined above.  No orders of the defined types were placed in this encounter.  No orders of the defined types were placed in this encounter.   Patient Instructions  Medication Instructions:  Your physician recommends that you continue on your current medications as directed. Please refer to the Current Medication list given to you today.  *If you need a refill on your cardiac medications before your next appointment, please call your pharmacy*  Follow-Up: At [facility], you and your health needs are our priority.  As part of our continuing mission to provide you with exceptional heart care, we have created designated Provider Care Teams.  These Care Teams include your primary Cardiologist (physician) and Advanced Practice Providers (APPs -  Physician Assistants and Nurse Practitioners) who all work together  to provide you with the care you need, when you need it.  We recommend signing up for the patient portal called "MyChart".  Sign up information is provided on this After Visit Summary.  MyChart is used to connect with patients for  Virtual Visits (Telemedicine).  Patients are able to view lab/test results, encounter notes, upcoming appointments, etc.  Non-urgent messages can be sent to your provider as well.   To learn more about what you can do with MyChart, go to [email or web address].    Your next appointment:   6 month(s)  Provider:   Little Ishikawa, MD     Other Instructions You can look into the [facility] device by [employer]. This device is purchased by you and it connects to an application you download to your smart phone.  It can detect abnormal heart rhythms and alert you to contact your doctor for further evaluation. The web site is:  [email or web address]        Signed, Little Ishikawa, MD  02/27/2023 1:52 PM    [facility]

## 2023-03-12 ENCOUNTER — Ambulatory Visit: Payer: Commercial Managed Care - HMO | Admitting: Podiatry

## 2023-03-19 ENCOUNTER — Ambulatory Visit (INDEPENDENT_AMBULATORY_CARE_PROVIDER_SITE_OTHER): Payer: Commercial Managed Care - HMO | Admitting: Podiatry

## 2023-03-19 DIAGNOSIS — Q828 Other specified congenital malformations of skin: Secondary | ICD-10-CM | POA: Diagnosis not present

## 2023-03-19 NOTE — Progress Notes (Signed)
Subjective:  Patient ID: [name], male    DOB: 1966-04-14,  MRN: [id number]  Chief Complaint  Patient presents with   Ingrown Toenail    57 y.o. male presents with the above complaint.  Patient presents with left submetatarsal 4 porokeratotic lesion painful to touch is progressive gotten worse worse with ambulation hurts with pressure he is a diabetic.  He has not seen anyone else prior to seeing me for this.  Denies any other acute complaints   Review of Systems: Negative except as noted in the HPI. Denies N/V/F/Ch.  Past Medical History:  Diagnosis Date   Allergy    Diabetes mellitus    Type 2 DM, Patient reports DM Type 1 diagnosd age at age 41   Diabetic foot ulcers (HCC)    ESRD (end stage renal disease) (HCC)    [facility]   Foot callus 07/01/2018   Foreign body in left foot    with  infection   Hypertension    Meniscus tear    Multiple rib fractures    Personal history of diabetic foot ulcer, Left Foot    Type 2 diabetes mellitus with stage 4 chronic kidney disease, without long-term current use of insulin (HCC) 06/01/2012    Current Outpatient Medications:    acetaminophen (TYLENOL) 325 MG tablet, Take 650 mg by mouth every 6 (six) hours as needed for mild pain or headache. , Disp: , Rfl:    amLODipine (NORVASC) 10 MG tablet, Take 10 mg by mouth daily., Disp: , Rfl:    aspirin EC 81 MG tablet, Take 81 mg by mouth daily. Swallow whole., Disp: , Rfl:    calcium acetate (PHOSLO) 667 MG capsule, Take 2,001 mg by mouth 3 (three) times daily., Disp: , Rfl:    calcium carbonate (TUMS - DOSED IN MG ELEMENTAL CALCIUM) 500 MG chewable tablet, Chew 1 tablet by mouth daily. (Patient not taking: Reported on 02/27/2023), Disp: , Rfl:    carvedilol (COREG) 25 MG tablet, Take 25 mg by mouth 2 (two) times daily with a meal., Disp: , Rfl:    hydrALAZINE (APRESOLINE) 25 MG tablet, Take 25 mg by mouth 3 (three) times daily., Disp: , Rfl:    irbesartan (AVAPRO) 300 MG  tablet, Take 300 mg by mouth daily., Disp: , Rfl:    lidocaine-prilocaine (EMLA) cream, Apply 1 application. topically daily as needed (prior to port being accessed for dialysis)., Disp: , Rfl:    rosuvastatin (CRESTOR) 20 MG tablet, Take 20 mg by mouth daily., Disp: , Rfl:   Social History   Tobacco Use  Smoking Status Former   Types: Cigarettes   Quit date: 09/03/2012   Years since quitting: 10.5  Smokeless Tobacco Former   Quit date: 09/03/2012    Allergies  Allergen Reactions   Latex Rash and Other (See Comments)    Pt states he is allergic to latex condoms   Ace Inhibitors Cough   Wound Dressing Adhesive Itching and Rash    Allergy to tape adhesive   Objective:  There were no vitals filed for this visit. There is no height or weight on file to calculate BMI. Constitutional Well developed. Well nourished.  Vascular Dorsalis pedis pulses palpable bilaterally. Posterior tibial pulses palpable bilaterally. Capillary refill normal to all digits.  No cyanosis or clubbing noted. Pedal hair growth normal.  Neurologic Normal speech. Oriented to person, place, and time. Epicritic sensation to light touch grossly present bilaterally.  Dermatologic Left submetatarsal 4 porokeratotic lesion with  central nucleated core noted.  No pinpoint bleeding noted upon debridement  Orthopedic: Normal joint ROM without pain or crepitus bilaterally. No visible deformities. No bony tenderness.   Radiographs: None Assessment:   1. Porokeratosis     Plan:  Patient was evaluated and treated and all questions answered.  Left submetatarsal 4 porokeratosis -All questions and concerns were discussed with the patient in extensive detail -Given the amount of pain that he is having using chisel blade and a handle as a courtesy the lesion was debrided down to healthy dry tissue.  No complication and no pinpoint bleeding noted Will discuss orthotics option during next visit to offload  No  follow-ups on file.

## 2023-04-01 ENCOUNTER — Emergency Department (HOSPITAL_COMMUNITY)
Admission: EM | Admit: 2023-04-01 | Discharge: 2023-04-01 | Disposition: A | Discharge status: Home / Self Care | Payer: Commercial Managed Care - HMO | Attending: Student | Admitting: Student

## 2023-04-01 ENCOUNTER — Encounter (HOSPITAL_COMMUNITY): Payer: Self-pay | Admitting: Emergency Medicine

## 2023-04-01 DIAGNOSIS — E1122 Type 2 diabetes mellitus with diabetic chronic kidney disease: Secondary | ICD-10-CM | POA: Diagnosis not present

## 2023-04-01 DIAGNOSIS — Z79899 Other long term (current) drug therapy: Secondary | ICD-10-CM | POA: Diagnosis not present

## 2023-04-01 DIAGNOSIS — Z992 Dependence on renal dialysis: Secondary | ICD-10-CM | POA: Insufficient documentation

## 2023-04-01 DIAGNOSIS — T82838A Hemorrhage of vascular prosthetic devices, implants and grafts, initial encounter: Secondary | ICD-10-CM | POA: Diagnosis present

## 2023-04-01 DIAGNOSIS — Z7982 Long term (current) use of aspirin: Secondary | ICD-10-CM | POA: Diagnosis not present

## 2023-04-01 DIAGNOSIS — I120 Hypertensive chronic kidney disease with stage 5 chronic kidney disease or end stage renal disease: Secondary | ICD-10-CM | POA: Diagnosis not present

## 2023-04-01 DIAGNOSIS — N186 End stage renal disease: Secondary | ICD-10-CM | POA: Insufficient documentation

## 2023-04-01 DIAGNOSIS — Z87891 Personal history of nicotine dependence: Secondary | ICD-10-CM | POA: Diagnosis not present

## 2023-04-01 DIAGNOSIS — R58 Hemorrhage, not elsewhere classified: Secondary | ICD-10-CM

## 2023-04-01 NOTE — ED Triage Notes (Signed)
Patient here from home reporting bleeding to access site for dialysis since yesterday.

## 2023-04-01 NOTE — ED Notes (Signed)
Bleeding controlled. Pt in no distress.

## 2023-04-02 NOTE — ED Provider Notes (Signed)
[name] AT [facility] Provider Note  CSN: [id number] Arrival date & time: 04/01/23 2440  Chief Complaint(s) Vascular Access Problem  HPI [name] is a 57 y.o. male with PMH ESRD on hemodialysis Tuesday Thursday Saturday, T2DM who presents emergency department for evaluation of a persistently bleeding dialysis fistula.  Patient has a right forearm dialysis fistula that was accessed today twice and has been persistently oozing.  Denies trauma to the area outside of dialysis access.  No blood thinner use.  Denies chest pain, shortness of breath, abdominal pain, nausea, vomiting or other systemic symptoms.   Past Medical History Past Medical History:  Diagnosis Date   Allergy    Diabetes mellitus    Type 2 DM, Patient reports DM Type 1 diagnosd age at age 85   Diabetic foot ulcers (HCC)    ESRD (end stage renal disease) (HCC)    [facility]   Foot callus 07/01/2018   Foreign body in left foot    with  infection   Hypertension    Meniscus tear    Multiple rib fractures    Personal history of diabetic foot ulcer, Left Foot    Type 2 diabetes mellitus with stage 4 chronic kidney disease, without long-term current use of insulin (HCC) 06/01/2012   Patient Active Problem List   Diagnosis Date Noted   Encounter for pre-transplant evaluation for kidney transplant 12/13/2022   Positive cardiac stress test 05/02/2022   Trigger finger, left middle finger 01/01/2022   Olecranon bursitis of both elbows 01/25/2021   End stage renal disease (HCC) 01/10/2021   Hyperlipidemia 05/28/2016   Hypertension 04/14/2012   History of diabetes mellitus 04/13/2012   Home Medication(s) Prior to Admission medications   Medication Sig Start Date End Date Taking? Authorizing Provider  acetaminophen (TYLENOL) 325 MG tablet Take 650 mg by mouth every 6 (six) hours as needed for mild pain or headache.     [provider]  amLODipine (NORVASC)  10 MG tablet Take 10 mg by mouth daily. 11/18/18   [provider]  aspirin EC 81 MG tablet Take 81 mg by mouth daily. Swallow whole.    [provider]  calcium acetate (PHOSLO) 667 MG capsule Take 2,001 mg by mouth 3 (three) times daily. 11/28/20   [provider]  calcium carbonate (TUMS - DOSED IN MG ELEMENTAL CALCIUM) 500 MG chewable tablet Chew 1 tablet by mouth daily. Patient not taking: Reported on 02/27/2023    [provider]  carvedilol (COREG) 25 MG tablet Take 25 mg by mouth 2 (two) times daily with a meal. 09/29/21   [provider]  hydrALAZINE (APRESOLINE) 25 MG tablet Take 25 mg by mouth 3 (three) times daily.    [provider]  irbesartan (AVAPRO) 300 MG tablet Take 300 mg by mouth daily.    [provider]  lidocaine-prilocaine (EMLA) cream Apply 1 application. topically daily as needed (prior to port being accessed for dialysis). 02/26/21   [provider]  rosuvastatin (CRESTOR) 20 MG tablet Take 20 mg by mouth daily.    [provider]  Past Surgical History Past Surgical History:  Procedure Laterality Date   A/V FISTULAGRAM Right 03/26/2021   Procedure: A/V FISTULAGRAM;  Surgeon: Maeola Harman, MD;  Location: [facility] INVASIVE CV LAB;  Service: Cardiovascular;  Laterality: Right;   AV FISTULA PLACEMENT Left 11/06/2020   Procedure: LEFT ARM FIRST STAGE BRACHIAL BASILIC ARTERIOVENOUS (AV) FISTULA CREATION;  Surgeon: Cephus Shelling, MD;  Location: [facility] OR;  Service: Vascular;  Laterality: Left;   AV FISTULA PLACEMENT Right 12/05/2020   Procedure: RADIOCEPHALIC ARTERIOVENOUS (AV) FISTULA CREATION RIGHT ARM;  Surgeon: Chuck Hint, MD;  Location: [facility] OR;  Service: Vascular;  Laterality: Right;   EYE SURGERY Bilateral    lazer   I & D EXTREMITY  04/23/2012    Procedure: IRRIGATION AND DEBRIDEMENT EXTREMITY;  Surgeon: Nadara Mustard, MD;  Location: [facility] OR;  Service: Orthopedics;  Laterality: Left;  Irrigation and Debridement Left Foot   I & D EXTREMITY  09/18/2012   Procedure: IRRIGATION AND DEBRIDEMENT EXTREMITY;  Surgeon: Nadara Mustard, MD;  Location: [facility] OR;  Service: Orthopedics;  Laterality: Left;   IR PERC TUN PERIT CATH WO PORT S&I /IMAG  08/30/2020   IR REMOVAL TUN CV CATH W/O FL  04/17/2021   IR US GUIDE VASC ACCESS RIGHT  08/30/2020   LIGATION OF ARTERIOVENOUS  FISTULA Left 12/05/2020   Procedure: LIGATION OF ARTERIOVENOUS  FISTULA LEFT ARM;  Surgeon: Chuck Hint, MD;  Location: [facility] OR;  Service: Vascular;  Laterality: Left;   MENISCUS REPAIR Left 04/2016   Orthopedist, Dr Yisroel Ramming in [place], [place]   Family History Family History  Problem Relation Age of Onset   Diabetes type II Mother    Arthritis Mother    Diabetes Mother    Hyperlipidemia Mother    Hypertension Mother    Diabetes Father    Diabetes type II Sister    Colon cancer Neg Hx    Stomach cancer Neg Hx    Esophageal cancer Neg Hx    Pancreatic cancer Neg Hx     Social History Social History   Tobacco Use   Smoking status: Former    Types: Cigarettes    Quit date: 09/03/2012    Years since quitting: 10.5   Smokeless tobacco: Former    Quit date: 09/03/2012  Vaping Use   Vaping Use: Never used  Substance Use Topics   Alcohol use: Not Currently   Drug use: Not Currently   Allergies Latex, Ace inhibitors, and Wound dressing adhesive  Review of Systems Review of Systems  All other systems reviewed and are negative.   Physical Exam Vital Signs  I have reviewed the triage vital signs BP (!) 148/72 (BP Location: Left Arm)   Pulse 68   Temp 98.1 F (36.7 C)   Resp 16   Ht 5' 10.5" (1.791 m)   Wt 89.8 kg   SpO2 99%   BMI 28.01 kg/m   Physical Exam Constitutional:      General: He is not in acute distress.    Appearance: Normal appearance.   HENT:     Head: Normocephalic and atraumatic.     Nose: No congestion or rhinorrhea.  Eyes:     General:        Right eye: No discharge.        Left eye: No discharge.     Extraocular Movements: Extraocular movements intact.     Pupils: Pupils are equal, round, and reactive to light.  Cardiovascular:  Rate and Rhythm: Normal rate and regular rhythm.     Heart sounds: No murmur heard. Pulmonary:     Effort: No respiratory distress.     Breath sounds: No wheezing or rales.  Abdominal:     General: There is no distension.     Tenderness: There is no abdominal tenderness.  Musculoskeletal:        General: Normal range of motion.     Cervical back: Normal range of motion.  Skin:    General: Skin is warm and dry.  Neurological:     General: No focal deficit present.     Mental Status: He is alert.     ED Results and Treatments Labs (all labs ordered are listed, but only abnormal results are displayed) Labs Reviewed - No data to display                                                                                                                        Radiology No results found.  Pertinent labs & imaging results that were available during my care of the patient were reviewed by me and considered in my medical decision making (see MDM for details).  Medications Ordered in ED Medications - No data to display                                                                                                                                   Procedures Procedures  (including critical care time)  Medical Decision Making / ED Course   This patient presents to the ED for concern of bleeding dialysis fistula, this involves an extensive number of treatment options, and is a complaint that carries with it a high risk of complications and morbidity.  The differential diagnosis includes persistent oozing, platelet dysfunction, coagulopathy  MDM: Patient seen emergency room for  evaluation of a bleeding dialysis fistula.  Physical exam reveals a small pinpoint ooze coming from the distal access site of his right forearm fistula.  Combat gauze placed over the area with a compression dressing with Coban.  On reevaluation 20 minutes later bleeding has resolved.  Patient was given combat gauze and Coban for home use and instructed on how to use this if he has persistent oozing at home.  At this time he does not meet inpatient criteria for admission as his symptoms have resolved and he is safe for discharge with outpatient follow-up.  Additional history obtained: -Additional history obtained from wife -External records from outside source obtained and reviewed including: Chart review including previous notes, labs, imaging, consultation notes    Medicines ordered and prescription drug management: No orders of the defined types were placed in this encounter.   -I have reviewed the patients home medicines and have made adjustments as needed  Critical interventions none     Cardiac Monitoring: The patient was maintained on a cardiac monitor.  I personally viewed and interpreted the cardiac monitored which showed an underlying rhythm of: NSR  Social Determinants of Health:  Factors impacting patients care include: none   Reevaluation: After the interventions noted above, I reevaluated the patient and found that they have :improved  Co morbidities that complicate the patient evaluation  Past Medical History:  Diagnosis Date   Allergy    Diabetes mellitus    Type 2 DM, Patient reports DM Type 1 diagnosd age at age 23   Diabetic foot ulcers (HCC)    ESRD (end stage renal disease) (HCC)    [facility]   Foot callus 07/01/2018   Foreign body in left foot    with  infection   Hypertension    Meniscus tear    Multiple rib fractures    Personal history of diabetic foot ulcer, Left Foot    Type 2 diabetes mellitus with stage 4 chronic kidney disease,  without long-term current use of insulin (HCC) 06/01/2012      Dispostion: I considered admission for this patient, but at this time does not meet inpatient criteria for admission and he is safe for discharge with outpatient follow-up     Final Clinical Impression(s) / ED Diagnoses Final diagnoses:  Bleeding     @PCDICTATION @    Glendora Score, MD 04/02/23 1240

## 2023-08-08 ENCOUNTER — Encounter (HOSPITAL_COMMUNITY): Payer: Self-pay | Admitting: Emergency Medicine

## 2023-08-08 ENCOUNTER — Ambulatory Visit (HOSPITAL_COMMUNITY)
Admission: EM | Admit: 2023-08-08 | Discharge: 2023-08-08 | Disposition: A | Discharge status: Home / Self Care | Payer: Managed Care, Other (non HMO) | Attending: Emergency Medicine | Admitting: Emergency Medicine

## 2023-08-08 DIAGNOSIS — E1022 Type 1 diabetes mellitus with diabetic chronic kidney disease: Secondary | ICD-10-CM | POA: Diagnosis not present

## 2023-08-08 DIAGNOSIS — Z1152 Encounter for screening for COVID-19: Secondary | ICD-10-CM | POA: Diagnosis not present

## 2023-08-08 DIAGNOSIS — B9789 Other viral agents as the cause of diseases classified elsewhere: Secondary | ICD-10-CM | POA: Insufficient documentation

## 2023-08-08 DIAGNOSIS — J069 Acute upper respiratory infection, unspecified: Secondary | ICD-10-CM

## 2023-08-08 DIAGNOSIS — I120 Hypertensive chronic kidney disease with stage 5 chronic kidney disease or end stage renal disease: Secondary | ICD-10-CM | POA: Diagnosis not present

## 2023-08-08 DIAGNOSIS — Z992 Dependence on renal dialysis: Secondary | ICD-10-CM | POA: Insufficient documentation

## 2023-08-08 DIAGNOSIS — N186 End stage renal disease: Secondary | ICD-10-CM | POA: Insufficient documentation

## 2023-08-08 DIAGNOSIS — R059 Cough, unspecified: Secondary | ICD-10-CM | POA: Diagnosis not present

## 2023-08-08 LAB — SARS CORONAVIRUS 2 (TAT 6-24 HRS): SARS Coronavirus 2: NEGATIVE

## 2023-08-08 MED ORDER — BENZONATATE 200 MG PO CAPS: 200.0000 mg | ORAL_CAPSULE | Freq: Three times a day (TID) | ORAL | 0 refills | Status: DC

## 2023-08-08 MED ORDER — GUAIFENESIN ER 600 MG PO TB12: 1200.0000 mg | ORAL_TABLET | Freq: Two times a day (BID) | ORAL | 0 refills | Status: AC

## 2023-08-08 NOTE — ED Triage Notes (Signed)
Pt reports cough since Monday. Reports was dry but now productive. Taking Coricidin  Pt reports went to dialysis this morning and was advised to go be seen after he was finished.

## 2023-08-08 NOTE — Discharge Instructions (Addendum)
Overall your physical exam was reassuring.  Your blood pressure was elevated today, ensure you are taking your blood pressure medications when she get home.  You can take the Tessalon Perles every 8 hours for cough suppression.  You can also sleep with a humidifier, do warm saline gargles and drink tea with honey to help with your sore throat.  Take the Mucinex daily to help loosen up your secretions.  Return to clinic or follow-up with your primary care provider if you develop any fever, no improvement over the next 5 days, or any new concerning symptoms.

## 2023-08-08 NOTE — ED Provider Notes (Signed)
[facility]    CSN: [id number] Arrival date & time: 08/08/23  5284      History   Chief Complaint Chief Complaint  Patient presents with   Cough    HPI [name] is a 57 y.o. male.   Patient presents to clinic with complaints of a cough that started on Monday.  Cough was initially dry, but it is now productive with phlegm.  He is also having general fatigue and bodyaches.    He denies any nasal congestion, rhinorrhea or nasal drainage.  He denies any wheezing or shortness of breath.  No fevers.  He has been taking Coricidin since Wednesday night.  He did go to dialysis this morning where his blood pressure was 197/68 he thinks, he does not take his blood pressure medication until after he returns home from dialysis.     The history is provided by the patient and medical records.  Cough   Past Medical History:  Diagnosis Date   Allergy    Diabetes mellitus    Type 2 DM, Patient reports DM Type 1 diagnosd age at age 49   Diabetic foot ulcers (HCC)    ESRD (end stage renal disease) (HCC)    [facility]   Foot callus 07/01/2018   Foreign body in left foot    with  infection   Hypertension    Meniscus tear    Multiple rib fractures    Personal history of diabetic foot ulcer, Left Foot    Type 2 diabetes mellitus with stage 4 chronic kidney disease, without long-term current use of insulin (HCC) 06/01/2012    Patient Active Problem List   Diagnosis Date Noted   Encounter for pre-transplant evaluation for kidney transplant 12/13/2022   Positive cardiac stress test 05/02/2022   Trigger finger, left middle finger 01/01/2022   Olecranon bursitis of both elbows 01/25/2021   End stage renal disease (HCC) 01/10/2021   Hyperlipidemia 05/28/2016   Hypertension 04/14/2012   History of diabetes mellitus 04/13/2012    Past Surgical History:  Procedure Laterality Date   A/V FISTULAGRAM Right 03/26/2021   Procedure: A/V FISTULAGRAM;  Surgeon:  Maeola Harman, MD;  Location: [facility] INVASIVE CV LAB;  Service: Cardiovascular;  Laterality: Right;   AV FISTULA PLACEMENT Left 11/06/2020   Procedure: LEFT ARM FIRST STAGE BRACHIAL BASILIC ARTERIOVENOUS (AV) FISTULA CREATION;  Surgeon: Cephus Shelling, MD;  Location: [facility] OR;  Service: Vascular;  Laterality: Left;   AV FISTULA PLACEMENT Right 12/05/2020   Procedure: RADIOCEPHALIC ARTERIOVENOUS (AV) FISTULA CREATION RIGHT ARM;  Surgeon: Chuck Hint, MD;  Location: [facility] OR;  Service: Vascular;  Laterality: Right;   EYE SURGERY Bilateral    lazer   I & D EXTREMITY  04/23/2012   Procedure: IRRIGATION AND DEBRIDEMENT EXTREMITY;  Surgeon: Nadara Mustard, MD;  Location: [facility] OR;  Service: Orthopedics;  Laterality: Left;  Irrigation and Debridement Left Foot   I & D EXTREMITY  09/18/2012   Procedure: IRRIGATION AND DEBRIDEMENT EXTREMITY;  Surgeon: Nadara Mustard, MD;  Location: [facility] OR;  Service: Orthopedics;  Laterality: Left;   IR PERC TUN PERIT CATH WO PORT S&I /IMAG  08/30/2020   IR REMOVAL TUN CV CATH W/O FL  04/17/2021   IR US GUIDE VASC ACCESS RIGHT  08/30/2020   LIGATION OF ARTERIOVENOUS  FISTULA Left 12/05/2020   Procedure: LIGATION OF ARTERIOVENOUS  FISTULA LEFT ARM;  Surgeon: Chuck Hint, MD;  Location: [facility] OR;  Service: Vascular;  Laterality: Left;   MENISCUS REPAIR Left 04/2016   Orthopedist, Dr Yisroel Ramming in [place], [place]       Home Medications    Prior to Admission medications   Medication Sig Start Date End Date Taking? Authorizing Provider  benzonatate (TESSALON) 200 MG capsule Take 1 capsule (200 mg total) by mouth every 8 (eight) hours. 08/08/23  Yes Rinaldo Ratel, [place] N, FNP  guaiFENesin (MUCINEX) 600 MG 12 hr tablet Take 2 tablets (1,200 mg total) by mouth 2 (two) times daily for 5 days. 08/08/23 08/13/23 Yes Rinaldo Ratel, [place], FNP  acetaminophen (TYLENOL) 325 MG tablet Take 650 mg by mouth every 6 (six) hours as needed for mild pain or headache.     [provider]  amLODipine (NORVASC) 10 MG tablet Take 10 mg by mouth daily. 11/18/18   [provider]  aspirin EC 81 MG tablet Take 81 mg by mouth daily. Swallow whole.    [provider]  calcium acetate (PHOSLO) 667 MG capsule Take 2,001 mg by mouth 3 (three) times daily. 11/28/20   [provider]  calcium carbonate (TUMS - DOSED IN MG ELEMENTAL CALCIUM) 500 MG chewable tablet Chew 1 tablet by mouth daily. Patient not taking: Reported on 02/27/2023    [provider]  carvedilol (COREG) 25 MG tablet Take 25 mg by mouth 2 (two) times daily with a meal. 09/29/21   [provider]  hydrALAZINE (APRESOLINE) 25 MG tablet Take 25 mg by mouth 3 (three) times daily.    [provider]  irbesartan (AVAPRO) 300 MG tablet Take 300 mg by mouth daily.    [provider]  lidocaine-prilocaine (EMLA) cream Apply 1 application. topically daily as needed (prior to port being accessed for dialysis). 02/26/21   [provider]  rosuvastatin (CRESTOR) 20 MG tablet Take 20 mg by mouth daily.    [provider]    Family History Family History  Problem Relation Age of Onset   Diabetes type II Mother    Arthritis Mother    Diabetes Mother    Hyperlipidemia Mother    Hypertension Mother    Diabetes Father    Diabetes type II Sister    Colon cancer Neg Hx    Stomach cancer Neg Hx    Esophageal cancer Neg Hx    Pancreatic cancer Neg Hx     Social History Social History   Tobacco Use   Smoking status: Former    Current packs/day: 0.00    Types: Cigarettes    Quit date: 09/03/2012    Years since quitting: 10.9   Smokeless tobacco: Former    Quit date: 09/03/2012  Vaping Use   Vaping status: Never Used  Substance Use Topics   Alcohol use: Not Currently   Drug use: Not Currently     Allergies   Latex, Ace inhibitors, and Wound dressing adhesive   Review of Systems Review of Systems  Per HPI   Physical  Exam Triage Vital Signs ED Triage Vitals  Encounter Vitals Group     BP 08/08/23 0930 (!) 203/88     Systolic BP Percentile --      Diastolic BP Percentile --      Pulse Rate 08/08/23 0930 63     Resp 08/08/23 0930 17     Temp 08/08/23 0930 98.3 F (36.8 C)     Temp Source 08/08/23 0930 Oral     SpO2 08/08/23 0930 96 %     Weight --  Height --      Head Circumference --      Peak Flow --      Pain Score 08/08/23 0928 0     Pain Loc --      Pain Education --      Exclude from Growth Chart --    No data found.  Updated Vital Signs BP (!) 203/88 (BP Location: Left Arm)   Pulse 63   Temp 98.3 F (36.8 C) (Oral)   Resp 17   SpO2 96%   Visual Acuity Right Eye Distance:   Left Eye Distance:   Bilateral Distance:    Right Eye Near:   Left Eye Near:    Bilateral Near:     Physical Exam Vitals and nursing note reviewed.  Constitutional:      Appearance: Normal appearance.  HENT:     Head: Normocephalic and atraumatic.     Right Ear: External ear normal.     Left Ear: External ear normal.     Nose: Nose normal.     Mouth/Throat:     Mouth: Mucous membranes are moist.     Pharynx: Posterior oropharyngeal erythema present.  Eyes:     Conjunctiva/sclera: Conjunctivae normal.  Cardiovascular:     Rate and Rhythm: Normal rate and regular rhythm.     Heart sounds: Normal heart sounds. No murmur heard. Pulmonary:     Effort: Pulmonary effort is normal. No respiratory distress.     Breath sounds: Normal breath sounds.  Musculoskeletal:        General: Normal range of motion.  Skin:    General: Skin is warm and dry.  Neurological:     General: No focal deficit present.     Mental Status: He is alert.  Psychiatric:        Mood and Affect: Mood normal.      UC Treatments / Results  Labs (all labs ordered are listed, but only abnormal results are displayed) Labs Reviewed  SARS CORONAVIRUS 2 (TAT 6-24 HRS)    EKG   Radiology No results  found.  Procedures Procedures (including critical care time)  Medications Ordered in UC Medications - No data to display  Initial Impression / Assessment and Plan / UC Course  I have reviewed the triage vital signs and the nursing notes.  Pertinent labs & imaging results that were available during my care of the patient were reviewed by me and considered in my medical decision making (see chart for details).  Vitals and triage reviewed, patient is hemodynamically stable.  He is hypertensive, has not yet taken his antihypertensive today until he returns home from dialysis.  Denies any vision changes or headache.  No signs or symptoms of hypertensive urgency or emergency.  Lungs are vesicular, heart with regular rate and rhythm.  Postnasal drip and posterior pharynx erythema present on physical exam.  Suspect viral URI, will test for COVID-19 as he is within the treatment window if positive.  He is a dialysis patient, so molnupiravir would be more appropriate.  Symptomatic management for cough and congestion discussed.  Plan of care, follow-up care and strict emergency precautions given, no questions at this time.     Final Clinical Impressions(s) / UC Diagnoses   Final diagnoses:  Viral URI with cough     Discharge Instructions      Overall your physical exam was reassuring.  Your blood pressure was elevated today, ensure you are taking your blood pressure medications when she get  home.  You can take the [facility] every 8 hours for cough suppression.  You can also sleep with a humidifier, do warm saline gargles and drink tea with honey to help with your sore throat.  Take the Mucinex daily to help loosen up your secretions.  Return to clinic or follow-up with your primary care provider if you develop any fever, no improvement over the next 5 days, or any new concerning symptoms.      ED Prescriptions     Medication Sig Dispense Auth. Provider   benzonatate (TESSALON)  200 MG capsule Take 1 capsule (200 mg total) by mouth every 8 (eight) hours. 30 capsule Rinaldo Ratel, [place], [place]   guaiFENesin (MUCINEX) 600 MG 12 hr tablet Take 2 tablets (1,200 mg total) by mouth 2 (two) times daily for 5 days. 20 tablet September Mormile, [place] N, [place]      PDMP not reviewed this encounter.   Kailee Essman, [place] N, [place] 08/08/23 1007

## 2023-08-11 ENCOUNTER — Ambulatory Visit (INDEPENDENT_AMBULATORY_CARE_PROVIDER_SITE_OTHER): Payer: Managed Care, Other (non HMO)

## 2023-08-11 ENCOUNTER — Encounter (HOSPITAL_COMMUNITY): Payer: Self-pay

## 2023-08-11 ENCOUNTER — Ambulatory Visit (HOSPITAL_COMMUNITY)
Admission: EM | Admit: 2023-08-11 | Discharge: 2023-08-11 | Disposition: A | Discharge status: Home / Self Care | Payer: Managed Care, Other (non HMO) | Attending: Emergency Medicine | Admitting: Emergency Medicine

## 2023-08-11 DIAGNOSIS — B9689 Other specified bacterial agents as the cause of diseases classified elsewhere: Secondary | ICD-10-CM | POA: Diagnosis not present

## 2023-08-11 DIAGNOSIS — J069 Acute upper respiratory infection, unspecified: Secondary | ICD-10-CM | POA: Diagnosis not present

## 2023-08-11 DIAGNOSIS — R051 Acute cough: Secondary | ICD-10-CM

## 2023-08-11 MED ORDER — AMOXICILLIN-POT CLAVULANATE 500-125 MG PO TABS: ORAL_TABLET | ORAL | 0 refills | Status: DC

## 2023-08-11 MED ORDER — AZITHROMYCIN 250 MG PO TABS: 250.0000 mg | ORAL_TABLET | Freq: Every day | ORAL | 0 refills | Status: DC

## 2023-08-11 MED ORDER — HYDROCOD POLI-CHLORPHE POLI ER 10-8 MG/5ML PO SUER: 5.0000 mL | Freq: Two times a day (BID) | ORAL | 0 refills | Status: DC | PRN

## 2023-08-11 MED ORDER — PREDNISONE 20 MG PO TABS: 40.0000 mg | ORAL_TABLET | Freq: Every day | ORAL | 0 refills | Status: AC

## 2023-08-11 NOTE — Discharge Instructions (Addendum)
Continue to take Tylenol as needed for body aches, fever or pain.  You can use the cough syrup twice daily as needed, this may cause drowsiness.  Take the azithromycin daily as scheduled.  Take the Augmentin as scheduled, but take it after dialysis when it falls on the days that you are scheduled for dialysis.  And take the steroids daily with breakfast.  Symptoms should improve over the next 2 to 3 days.  If any changes or no improvement please return to clinic.

## 2023-08-11 NOTE — ED Triage Notes (Signed)
Pt is here for follow-up on his productive cough. Pt reports his ribs hurt. Pt was advised to take Mucinex and Tessalon. Pt is current dialysis patient.

## 2023-08-11 NOTE — ED Provider Notes (Signed)
[facility]    CSN: [id number] Arrival date & time: 08/11/23  4259      History   Chief Complaint No chief complaint on file.   HPI [name] is a 57 y.o. male.   Patient presents to clinic endorsing continued productive cough.  He has been coughing throughout the night and has been keeping him up.  Reports his rib cage hurt where he has been coughing, also endorses posttussive emesis.  He has been taking Mucinex, Tessalon and Coricidin without much relief.  Saturday he had a fever of 100 point something, is unsure of the actual temperature.  He had been given Tylenol and his temperature improved.  Endorses shortness of breath with coughing fits.  Endorses wheezing with coughing.  He did not go to dialysis today due to how bad he was feeling.  He has taken his blood pressure medication this morning.  The history is provided by the patient and medical records.    Past Medical History:  Diagnosis Date   Allergy    Diabetes mellitus    Type 2 DM, Patient reports DM Type 1 diagnosd age at age 59   Diabetic foot ulcers (HCC)    ESRD (end stage renal disease) (HCC)    [facility]   Foot callus 07/01/2018   Foreign body in left foot    with  infection   Hypertension    Meniscus tear    Multiple rib fractures    Personal history of diabetic foot ulcer, Left Foot    Type 2 diabetes mellitus with stage 4 chronic kidney disease, without long-term current use of insulin (HCC) 06/01/2012    Patient Active Problem List   Diagnosis Date Noted   Encounter for pre-transplant evaluation for kidney transplant 12/13/2022   Positive cardiac stress test 05/02/2022   Trigger finger, left middle finger 01/01/2022   Olecranon bursitis of both elbows 01/25/2021   End stage renal disease (HCC) 01/10/2021   Hyperlipidemia 05/28/2016   Hypertension 04/14/2012   History of diabetes mellitus 04/13/2012    Past Surgical History:  Procedure Laterality Date    A/V FISTULAGRAM Right 03/26/2021   Procedure: A/V FISTULAGRAM;  Surgeon: Maeola Harman, MD;  Location: [facility] INVASIVE CV LAB;  Service: Cardiovascular;  Laterality: Right;   AV FISTULA PLACEMENT Left 11/06/2020   Procedure: LEFT ARM FIRST STAGE BRACHIAL BASILIC ARTERIOVENOUS (AV) FISTULA CREATION;  Surgeon: Cephus Shelling, MD;  Location: [facility];  Service: Vascular;  Laterality: Left;   AV FISTULA PLACEMENT Right 12/05/2020   Procedure: RADIOCEPHALIC ARTERIOVENOUS (AV) FISTULA CREATION RIGHT ARM;  Surgeon: Chuck Hint, MD;  Location: [facility] OR;  Service: Vascular;  Laterality: Right;   EYE SURGERY Bilateral    lazer   I & D EXTREMITY  04/23/2012   Procedure: IRRIGATION AND DEBRIDEMENT EXTREMITY;  Surgeon: Nadara Mustard, MD;  [facility]: [facility] OR;  Service: Orthopedics;  Laterality: Left;  Irrigation and Debridement Left Foot   I & D EXTREMITY  09/18/2012   Procedure: IRRIGATION AND DEBRIDEMENT EXTREMITY;  Surgeon: Nadara Mustard, MD;  Location: [facility] OR;  Service: Orthopedics;  Laterality: Left;   IR PERC TUN PERIT CATH WO PORT S&I /IMAG  08/30/2020   IR REMOVAL TUN CV CATH W/O FL  04/17/2021   IR US GUIDE VASC ACCESS RIGHT  08/30/2020   LIGATION OF ARTERIOVENOUS  FISTULA Left 12/05/2020   Procedure: LIGATION OF ARTERIOVENOUS  FISTULA LEFT ARM;  Surgeon: Chuck Hint, MD;  Location:  [facility] OR;  Service: Vascular;  Laterality: Left;   MENISCUS REPAIR Left 04/2016   Orthopedist, Dr Yisroel Ramming in [place], [place]       Home Medications    Prior to Admission medications   Medication Sig Start Date End Date Taking? Authorizing Provider  amLODipine (NORVASC) 10 MG tablet Take 10 mg by mouth daily. 11/18/18  Yes [provider]  amoxicillin-clavulanate (AUGMENTIN) 500-125 MG tablet Take 1 tablet daily (take after dialysis) for 7 days. 08/11/23  Yes Rinaldo Ratel, [place], FNP  aspirin EC 81 MG tablet Take 81 mg by mouth daily. Swallow whole.   Yes [provider]   azithromycin (ZITHROMAX) 250 MG tablet Take 1 tablet (250 mg total) by mouth daily. Take first 2 tablets together, then 1 every day until finished. 08/11/23  Yes Rinaldo Ratel, [place] N, FNP  benzonatate (TESSALON) 200 MG capsule Take 1 capsule (200 mg total) by mouth every 8 (eight) hours. 08/08/23  Yes Rinaldo Ratel, [place] N, FNP  calcium acetate (PHOSLO) 667 MG capsule Take 2,001 mg by mouth 3 (three) times daily. 11/28/20  Yes [provider]  carvedilol (COREG) 25 MG tablet Take 25 mg by mouth 2 (two) times daily with a meal. 09/29/21  Yes [provider]  chlorpheniramine-HYDROcodone (TUSSIONEX) 10-8 MG/5ML Take 5 mLs by mouth every 12 (twelve) hours as needed for cough. 08/11/23  Yes Rinaldo Ratel, [place] N, FNP  guaiFENesin (MUCINEX) 600 MG 12 hr tablet Take 2 tablets (1,200 mg total) by mouth 2 (two) times daily for 5 days. 08/08/23 08/13/23 Yes Rinaldo Ratel, [place] N, FNP  hydrALAZINE (APRESOLINE) 25 MG tablet Take 25 mg by mouth 3 (three) times daily.   Yes [provider]  irbesartan (AVAPRO) 300 MG tablet Take 300 mg by mouth daily.   Yes [provider]  predniSONE (DELTASONE) 20 MG tablet Take 2 tablets (40 mg total) by mouth daily for 5 days. 08/11/23 08/16/23 Yes Rinaldo Ratel, [place], FNP  rosuvastatin (CRESTOR) 20 MG tablet Take 20 mg by mouth daily.   Yes [provider]  acetaminophen (TYLENOL) 325 MG tablet Take 650 mg by mouth every 6 (six) hours as needed for mild pain or headache.     [provider]  calcium carbonate (TUMS - DOSED IN MG ELEMENTAL CALCIUM) 500 MG chewable tablet Chew 1 tablet by mouth daily. Patient not taking: Reported on 02/27/2023    [provider]  lidocaine-prilocaine (EMLA) cream Apply 1 application. topically daily as needed (prior to port being accessed for dialysis). 02/26/21   [provider]    Family History Family History  Problem Relation Age of Onset   Diabetes type II Mother    Arthritis  Mother    Diabetes Mother    Hyperlipidemia Mother    Hypertension Mother    Diabetes Father    Diabetes type II Sister    Colon cancer Neg Hx    Stomach cancer Neg Hx    Esophageal cancer Neg Hx    Pancreatic cancer Neg Hx     Social History Social History   Tobacco Use   Smoking status: Former    Current packs/day: 0.00    Types: Cigarettes    Quit date: 09/03/2012    Years since quitting: 10.9   Smokeless tobacco: Former    Quit date: 09/03/2012  Vaping Use   Vaping status: Never Used  Substance Use Topics   Alcohol use: Not Currently   Drug use: Not Currently     Allergies  Latex, Ace inhibitors, and Wound dressing adhesive   Review of Systems Review of Systems  Per HPI   Physical Exam Triage Vital Signs ED Triage Vitals [08/11/23 0839]  Encounter Vitals Group     BP 136/60     Systolic BP Percentile      Diastolic BP Percentile      Pulse Rate (!) 48     Resp 18     Temp 97.7 F (36.5 C)     Temp Source Oral     SpO2 97 %     Weight      Height      Head Circumference      Peak Flow      Pain Score      Pain Loc      Pain Education      Exclude from Growth Chart    No data found.  Updated Vital Signs BP 136/60 (BP Location: Left Arm)   Pulse (!) 48   Temp 97.7 F (36.5 C) (Oral)   Resp 18   SpO2 97%   Visual Acuity Right Eye Distance:   Left Eye Distance:   Bilateral Distance:    Right Eye Near:   Left Eye Near:    Bilateral Near:     Physical Exam Vitals and nursing note reviewed.  Constitutional:      Appearance: Normal appearance.  HENT:     Head: Normocephalic and atraumatic.     Right Ear: External ear normal.     Left Ear: External ear normal.     Nose: Nose normal.     Mouth/Throat:     Mouth: Mucous membranes are moist.  Eyes:     Conjunctiva/sclera: Conjunctivae normal.  Cardiovascular:     Rate and Rhythm: Normal rate and regular rhythm.     Heart sounds: Normal heart sounds. No murmur heard. Pulmonary:      Effort: Pulmonary effort is normal.     Breath sounds: Examination of the right-lower field reveals decreased breath sounds. Examination of the left-lower field reveals decreased breath sounds. Decreased breath sounds present.     Comments: Diminished lung sounds in bases bilaterally.  Musculoskeletal:        General: Normal range of motion.  Skin:    General: Skin is warm and dry.  Neurological:     General: No focal deficit present.     Mental Status: He is alert.  Psychiatric:        Mood and Affect: Mood normal.        Behavior: Behavior is cooperative.      UC Treatments / Results  Labs (all labs ordered are listed, but only abnormal results are displayed) Labs Reviewed - No data to display  EKG   Radiology DG Chest 2 View  Result Date: 08/11/2023 CLINICAL DATA:  cough, SOB, dialysis pt EXAM: CHEST - 2 VIEW COMPARISON:  CXR 08/28/20 FINDINGS: No pleural effusion. No pneumothorax. No focal airspace opacity. Borderline cardiomegaly. There are prominent bilateral interstitial opacities that could represent pulmonary venous congestion or atypical infection. No radiographically apparent displaced rib fractures. Visualized upper abdomen is unremarkable. Vertebral body heights are maintained. IMPRESSION: Prominent bilateral interstitial opacities could represent pulmonary venous congestion or atypical infection. Electronically Signed   By: Lorenza Cambridge M.D.   On: 08/11/2023 09:36    Procedures Procedures (including critical care time)  Medications Ordered in UC Medications - No data to display  Initial Impression / Assessment and Plan / UC Course  I have reviewed the triage vital signs and the nursing notes.  Pertinent labs & imaging results that were available during my care of the patient were reviewed by me and considered in my medical decision making (see chart for details).  Vitals and triage reviewed, patient is hemodynamically stable.  Heart with regular rate and  rhythm, lungs are diminished in lower lobes.  Chest x-ray obtained, some opacity noted in right lower lobe.  Will cover for CAP due to 1 week duration of symptoms, fever development and worsening/ progression of symptoms.  Augmentin dosed every 24 hours and to be administered after dialysis on dialysis days.  Steroids given to help with inflammation, cough medication prescribed.  Plan of care, follow-up care and return precautions given, no questions at this time.  Radiology interpretation shows potential for atypical infection, covered with dual antibiotics for CAP.      Final Clinical Impressions(s) / UC Diagnoses   Final diagnoses:  Bacterial URI  Acute cough     Discharge Instructions      Continue to take Tylenol as needed for body aches, fever or pain.  You can use the cough syrup twice daily as needed, this may cause drowsiness.  Take the azithromycin daily as scheduled.  Take the Augmentin as scheduled, but take it after dialysis when it falls on the days that you are scheduled for dialysis.  And take the steroids daily with breakfast.  Symptoms should improve over the next 2 to 3 days.  If any changes or no improvement please return to clinic.      ED Prescriptions     Medication Sig Dispense Auth. Provider   chlorpheniramine-HYDROcodone (TUSSIONEX) 10-8 MG/5ML Take 5 mLs by mouth every 12 (twelve) hours as needed for cough. 70 mL Rinaldo Ratel, [place], FNP   amoxicillin-clavulanate (AUGMENTIN) 500-125 MG tablet Take 1 tablet daily (take after dialysis) for 7 days. 7 tablet Rinaldo Ratel, [place] N, [place]   azithromycin (ZITHROMAX) 250 MG tablet Take 1 tablet (250 mg total) by mouth daily. Take first 2 tablets together, then 1 every day until finished. 6 tablet Rinaldo Ratel, [place], [place]   predniSONE (DELTASONE) 20 MG tablet Take 2 tablets (40 mg total) by mouth daily for 5 days. 10 tablet Wirt Hemmerich, [place] N, [place]      I have reviewed the PDMP during this encounter.   Rinaldo Ratel  [place] N, [place] 08/11/23 [phone]

## 2023-10-22 ENCOUNTER — Encounter: Payer: Self-pay | Admitting: Podiatry

## 2023-10-22 ENCOUNTER — Ambulatory Visit: Payer: Commercial Managed Care - HMO | Admitting: Podiatry

## 2023-10-22 DIAGNOSIS — M6740 Ganglion, unspecified site: Secondary | ICD-10-CM

## 2023-10-22 DIAGNOSIS — B351 Tinea unguium: Secondary | ICD-10-CM

## 2023-10-22 DIAGNOSIS — E1140 Type 2 diabetes mellitus with diabetic neuropathy, unspecified: Secondary | ICD-10-CM | POA: Diagnosis not present

## 2023-10-22 DIAGNOSIS — M79674 Pain in right toe(s): Secondary | ICD-10-CM

## 2023-10-22 DIAGNOSIS — M79675 Pain in left toe(s): Secondary | ICD-10-CM | POA: Diagnosis not present

## 2023-10-22 NOTE — Progress Notes (Signed)
 Subjective:   Patient ID: [name], male   DOB: 58 y.o.   MRN: [id number]   HPI Patient presents with significant lump formation left midfoot that is localized to this area slightly smaller than before still present and also is noted to have thick yellow brittle nailbeds 1-5 both feet that are bothersome for him and he cannot take care of himself with high risk factors on dialysis long-term diabetes   ROS      Objective:  Physical Exam  VAC ocular status was intact neurologically is diminished with thick yellow brittle nailbeds 1-5 both feet and also is noted to have a enlargement in the midfoot left localized nonpainful with incision site around the area     Assessment:  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  Debrided nailbeds 1-5 both feet no iatrogenic bleeding this can be done as needed and he will continue to try to take good care of his health and diabetes and I reviewed all that with him along with daily foot inspections

## 2023-11-14 ENCOUNTER — Ambulatory Visit: Payer: Managed Care, Other (non HMO) | Admitting: Cardiology

## 2024-01-05 ENCOUNTER — Encounter: Admitting: Family Medicine

## 2024-01-08 ENCOUNTER — Ambulatory Visit (HOSPITAL_COMMUNITY): Admission: EM | Admit: 2024-01-08 | Discharge: 2024-01-08 | Disposition: A | Discharge status: Home / Self Care

## 2024-01-08 ENCOUNTER — Encounter (HOSPITAL_COMMUNITY): Payer: Self-pay

## 2024-01-08 DIAGNOSIS — I120 Hypertensive chronic kidney disease with stage 5 chronic kidney disease or end stage renal disease: Secondary | ICD-10-CM | POA: Diagnosis not present

## 2024-01-08 DIAGNOSIS — Z87891 Personal history of nicotine dependence: Secondary | ICD-10-CM | POA: Diagnosis not present

## 2024-01-08 DIAGNOSIS — R0600 Dyspnea, unspecified: Secondary | ICD-10-CM | POA: Insufficient documentation

## 2024-01-08 DIAGNOSIS — Z992 Dependence on renal dialysis: Secondary | ICD-10-CM | POA: Diagnosis not present

## 2024-01-08 DIAGNOSIS — N186 End stage renal disease: Secondary | ICD-10-CM | POA: Insufficient documentation

## 2024-01-08 DIAGNOSIS — J069 Acute upper respiratory infection, unspecified: Secondary | ICD-10-CM | POA: Diagnosis not present

## 2024-01-08 DIAGNOSIS — R059 Cough, unspecified: Secondary | ICD-10-CM | POA: Insufficient documentation

## 2024-01-08 LAB — POCT INFLUENZA A/B
Influenza A, POC: NEGATIVE
Influenza B, POC: NEGATIVE

## 2024-01-08 MED ORDER — BENZONATATE 200 MG PO CAPS: 200.0000 mg | ORAL_CAPSULE | Freq: Three times a day (TID) | ORAL | 0 refills | Status: DC | PRN

## 2024-01-08 MED ORDER — AZELASTINE HCL 0.1 % NA SOLN: 1.0000 | Freq: Two times a day (BID) | NASAL | 1 refills | Status: DC

## 2024-01-08 NOTE — Discharge Instructions (Addendum)
 1. Viral URI with cough (Primary) - POC Influenza A/B completed in UC is negative for influenza - SARS CORONAVIRUS 2 (TAT 6-24 HRS) Anterior Nasal Swab collected in UC and sent to lab for further testing results should be available within 24 hours. - azelastine (ASTELIN) 0.1 % nasal spray; Place 1 spray into both nostrils 2 (two) times daily. Use in each nostril as directed   - benzonatate (TESSALON) 200 MG capsule; Take 1 capsule (200 mg total) by mouth 3 (three) times daily as needed for cough.   -Continue to monitor symptoms for any changes severity if there is any escalation of current symptoms or development of new symptoms follow-up in ER for further evaluation and management.

## 2024-01-08 NOTE — ED Provider Notes (Signed)
 [facility]  Note:  This document was prepared using Dragon voice recognition software and may include unintentional dictation errors.  MRN: [id number] DOB: 12-08-1965  Subjective:   [name] is a 58 y.o. male presenting for cough, nasal congestion, dyspnea with coughing x 4 days.  Patient reports that cough is worse at night keeps him up and not able to sleep.  Patient is been taking Coricidin and Clarinex with minimal improvement.  Patient reports medical history of renal failure with dialysis.  Last dialysis was yesterday.  Denies chest pain, shortness of breath, weakness, dizziness at this time.  No current facility-administered medications for this encounter.  Current Outpatient Medications:    azelastine (ASTELIN) 0.1 % nasal spray, Place 1 spray into both nostrils 2 (two) times daily. Use in each nostril as directed, Disp: 30 mL, Rfl: 1   benzonatate (TESSALON) 200 MG capsule, Take 1 capsule (200 mg total) by mouth 3 (three) times daily as needed for cough., Disp: 20 capsule, Rfl: 0   cloNIDine (CATAPRES) 0.1 MG tablet, Take 0.1 mg by mouth 2 (two) times daily., Disp: , Rfl:    acetaminophen (TYLENOL) 325 MG tablet, Take 650 mg by mouth every 6 (six) hours as needed for mild pain or headache. , Disp: , Rfl:    amLODipine (NORVASC) 10 MG tablet, Take 10 mg by mouth daily., Disp: , Rfl:    aspirin EC 81 MG tablet, Take 81 mg by mouth daily. Swallow whole., Disp: , Rfl:    calcium acetate (PHOSLO) 667 MG capsule, Take 2,001 mg by mouth 3 (three) times daily., Disp: , Rfl:    calcium carbonate (TUMS - DOSED IN MG ELEMENTAL CALCIUM) 500 MG chewable tablet, Chew 1 tablet by mouth daily., Disp: , Rfl:    carvedilol (COREG) 25 MG tablet, Take 25 mg by mouth 2 (two) times daily with a meal., Disp: , Rfl:    hydrALAZINE (APRESOLINE) 25 MG tablet, Take 25 mg by mouth 3 (three) times daily., Disp: , Rfl:    irbesartan (AVAPRO) 300 MG tablet, Take 300 mg by mouth daily.,  Disp: , Rfl:    lidocaine-prilocaine (EMLA) cream, Apply 1 application. topically daily as needed (prior to port being accessed for dialysis)., Disp: , Rfl:    rosuvastatin (CRESTOR) 20 MG tablet, Take 20 mg by mouth daily., Disp: , Rfl:    Allergies  Allergen Reactions   Latex Rash and Other (See Comments)    Pt states he is allergic to latex condoms   Ace Inhibitors Cough   Wound Dressing Adhesive Itching and Rash    Allergy to tape adhesive    Past Medical History:  Diagnosis Date   Allergy    Diabetes mellitus    Type 2 DM, Patient reports DM Type 1 diagnosd age at age 76   Diabetic foot ulcers (HCC)    ESRD (end stage renal disease) (HCC)    [facility]   Foot callus 07/01/2018   Foreign body in left foot    with  infection   Hypertension    Meniscus tear    Multiple rib fractures    Personal history of diabetic foot ulcer, Left Foot    Type 2 diabetes mellitus with stage 4 chronic kidney disease, without long-term current use of insulin (HCC) 06/01/2012     Past Surgical History:  Procedure Laterality Date   A/V FISTULAGRAM Right 03/26/2021   Procedure: A/V FISTULAGRAM;  Surgeon: Maeola Harman, MD;  Location: [facility] INVASIVE  CV LAB;  Service: Cardiovascular;  Laterality: Right;   AV FISTULA PLACEMENT Left 11/06/2020   Procedure: LEFT ARM FIRST STAGE BRACHIAL BASILIC ARTERIOVENOUS (AV) FISTULA CREATION;  Surgeon: Cephus Shelling, MD;  [facility];  Service: Vascular;  Laterality: Left;   AV FISTULA PLACEMENT Right 12/05/2020   Procedure: RADIOCEPHALIC ARTERIOVENOUS (AV) FISTULA CREATION RIGHT ARM;  Surgeon: Chuck Hint, MD;  Location: [facility] OR;  Service: Vascular;  Laterality: Right;   EYE SURGERY Bilateral    lazer   I & D EXTREMITY  04/23/2012   Procedure: IRRIGATION AND DEBRIDEMENT EXTREMITY;  Surgeon: Nadara Mustard, MD;  Location[facility] OR;  Service: Orthopedics;  Laterality: Left;  Irrigation and Debridement Left Foot   I & D EXTREMITY   09/18/2012   Procedure: IRRIGATION AND DEBRIDEMENT EXTREMITY;  Surgeon: Nadara Mustard, MD;  [facility];  Service: Orthopedics;  Laterality: Left;   IR PERC TUN PERIT CATH WO PORT S&I /IMAG  08/30/2020   IR REMOVAL TUN CV CATH W/O FL  04/17/2021   IR US GUIDE VASC ACCESS RIGHT  08/30/2020   LIGATION OF ARTERIOVENOUS  FISTULA Left 12/05/2020   Procedure: LIGATION OF ARTERIOVENOUS  FISTULA LEFT ARM;  Surgeon: Chuck Hint, MD;  Location: [facility] OR;  Service: Vascular;  Laterality: Left;   MENISCUS REPAIR Left 04/2016   Orthopedist, Dr Yisroel Ramming in [place], [place]    Family History  Problem Relation Age of Onset   Diabetes type II Mother    Arthritis Mother    Diabetes Mother    Hyperlipidemia Mother    Hypertension Mother    Diabetes Father    Diabetes type II Sister    Colon cancer Neg Hx    Stomach cancer Neg Hx    Esophageal cancer Neg Hx    Pancreatic cancer Neg Hx     Social History   Tobacco Use   Smoking status: Former    Current packs/day: 0.00    Types: Cigarettes    Quit date: 09/03/2012    Years since quitting: 11.3   Smokeless tobacco: Former    Quit date: 09/03/2012  Vaping Use   Vaping status: Never Used  Substance Use Topics   Alcohol use: Not Currently   Drug use: Not Currently    ROS Refer to HPI for ROS details.  Objective:   Vitals: BP (!) 150/67 (BP Location: Left Arm)   Pulse (!) 52   Temp 98.1 F (36.7 C) (Oral)   Resp 16   Ht 5\' 10"  (1.778 m)   Wt 198 lb (89.8 kg)   SpO2 94%   BMI 28.41 kg/m   Physical Exam Vitals and nursing note reviewed.  Constitutional:      General: He is not in acute distress.    Appearance: Normal appearance. He is well-developed. He is not ill-appearing or toxic-appearing.  HENT:     Head: Normocephalic.     Nose: Congestion and rhinorrhea present.     Mouth/Throat:     Mouth: Mucous membranes are moist.     Pharynx: Oropharynx is clear. No oropharyngeal exudate or posterior oropharyngeal erythema.   Eyes:     Extraocular Movements: Extraocular movements intact.     Conjunctiva/sclera: Conjunctivae normal.  Cardiovascular:     Rate and Rhythm: Normal rate and regular rhythm.     Heart sounds: No murmur heard. Pulmonary:     Effort: Pulmonary effort is normal. No respiratory distress.     Breath sounds: Normal breath sounds.  No stridor. No wheezing, rhonchi or rales.  Skin:    General: Skin is warm and dry.  Neurological:     General: No focal deficit present.     Mental Status: He is alert and oriented to person, place, and time.  Psychiatric:        Mood and Affect: Mood normal.     Procedures  Results for orders placed or performed during the hospital encounter of 01/08/24 (from the past 24 hours)  POC Influenza A/B     Status: None   Collection Time: 01/08/24  9:12 AM  Result Value Ref Range   Influenza A, POC Negative Negative   Influenza B, POC Negative Negative    Assessment and Plan :   PDMP not reviewed this encounter.  1. Viral URI with cough     1. Viral URI with cough (Primary) - POC Influenza A/B completed in UC is negative for influenza - SARS CORONAVIRUS 2 (TAT 6-24 HRS) Anterior Nasal Swab collected in UC and sent to lab for further testing results should be available within 24 hours. - azelastine (ASTELIN) 0.1 % nasal spray; Place 1 spray into both nostrils 2 (two) times daily. Use in each nostril as directed   - benzonatate (TESSALON) 200 MG capsule; Take 1 capsule (200 mg total) by mouth 3 (three) times daily as needed for cough.   -Continue to monitor symptoms for any change in severity if there is any escalation of current symptoms or development of new symptoms follow-up in ER for further evaluation and management.  Lucky Cowboy   [place], [place], [place] 01/08/24 [phone]

## 2024-01-08 NOTE — ED Triage Notes (Signed)
 Patient here today with cough, headache, and chest soreness since Sunday evening. He has been taking Coricidin and Clarinex with so relief. No known sick contacts.

## 2024-01-09 LAB — SARS CORONAVIRUS 2 (TAT 6-24 HRS): SARS Coronavirus 2: NEGATIVE

## 2024-01-28 ENCOUNTER — Ambulatory Visit (INDEPENDENT_AMBULATORY_CARE_PROVIDER_SITE_OTHER): Admitting: Podiatry

## 2024-01-28 DIAGNOSIS — D492 Neoplasm of unspecified behavior of bone, soft tissue, and skin: Secondary | ICD-10-CM

## 2024-01-28 NOTE — Progress Notes (Signed)
 Subjective:  Patient ID: [name], male    DOB: [birth year],  MRN: [id number]  Chief Complaint  Patient presents with   Foot Pain    58 y.o. male presents with the above complaint.  Patient presents with left submetatarsal 4 skin neoplasm that is painful to touch.  He would like to have it debrided that he has not seen and was positive he denies any other acute complaints.   Review of Systems: Negative except as noted in the HPI. Denies N/V/F/Ch.  Past Medical History:  Diagnosis Date   Allergy    Diabetes mellitus    Type 2 DM, Patient reports DM Type 1 diagnosd age at age 81   Diabetic foot ulcers (HCC)    ESRD (end stage renal disease) (HCC)    [facility]   Foot callus 07/01/2018   Foreign body in left foot    with  infection   Hypertension    Meniscus tear    Multiple rib fractures    Personal history of diabetic foot ulcer, Left Foot    Type 2 diabetes mellitus with stage 4 chronic kidney disease, without long-term current use of insulin  (HCC) 06/01/2012    Current Outpatient Medications:    acetaminophen  (TYLENOL ) 325 MG tablet, Take 650 mg by mouth every 6 (six) hours as needed for mild pain or headache. , Disp: , Rfl:    amLODipine  (NORVASC ) 10 MG tablet, Take 10 mg by mouth daily., Disp: , Rfl:    aspirin  EC 81 MG tablet, Take 81 mg by mouth daily. Swallow whole., Disp: , Rfl:    azelastine  (ASTELIN ) 0.1 % nasal spray, Place 1 spray into both nostrils 2 (two) times daily. Use in each nostril as directed, Disp: 30 mL, Rfl: 1   benzonatate  (TESSALON ) 200 MG capsule, Take 1 capsule (200 mg total) by mouth 3 (three) times daily as needed for cough., Disp: 20 capsule, Rfl: 0   calcium  acetate (PHOSLO) 667 MG capsule, Take 2,001 mg by mouth 3 (three) times daily., Disp: , Rfl:    calcium  carbonate (TUMS - DOSED IN MG ELEMENTAL CALCIUM ) 500 MG chewable tablet, Chew 1 tablet by mouth daily., Disp: , Rfl:    carvedilol  (COREG ) 25 MG tablet, Take 25 mg by  mouth 2 (two) times daily with a meal., Disp: , Rfl:    cloNIDine  (CATAPRES ) 0.1 MG tablet, Take 0.1 mg by mouth 2 (two) times daily., Disp: , Rfl:    hydrALAZINE  (APRESOLINE ) 25 MG tablet, Take 25 mg by mouth 3 (three) times daily., Disp: , Rfl:    irbesartan (AVAPRO) 300 MG tablet, Take 300 mg by mouth daily., Disp: , Rfl:    lidocaine -prilocaine (EMLA) cream, Apply 1 application. topically daily as needed (prior to port being accessed for dialysis)., Disp: , Rfl:    rosuvastatin  (CRESTOR ) 20 MG tablet, Take 20 mg by mouth daily., Disp: , Rfl:   Social History   Tobacco Use  Smoking Status Former   Current packs/day: 0.00   Types: Cigarettes   Quit date: 09/03/2012   Years since quitting: 11.4  Smokeless Tobacco Former   Quit date: 09/03/2012    Allergies  Allergen Reactions   Latex Rash and Other (See Comments)    Pt states he is allergic to latex condoms   Ace Inhibitors Cough   Wound Dressing Adhesive Itching and Rash    Allergy to tape adhesive   Objective:  There were no vitals filed for this visit. There is no  height or weight on file to calculate BMI. Constitutional Well developed. Well nourished.  Vascular Dorsalis pedis pulses palpable bilaterally. Posterior tibial pulses palpable bilaterally. Capillary refill normal to all digits.  No cyanosis or clubbing noted. Pedal hair growth normal.  Neurologic Normal speech. Oriented to person, place, and time. Epicritic sensation to light touch grossly present bilaterally.  Dermatologic Left submetatarsal 4 porokeratotic lesion with central nucleated core noted.  No pinpoint bleeding noted upon debridement  Orthopedic: Normal joint ROM without pain or crepitus bilaterally. No visible deformities. No bony tenderness.   Radiographs: None Assessment:   No diagnosis found.   Plan:  Patient was evaluated and treated and all questions answered.  Left submetatarsal 4 skin neoplasm -All questions and concerns were  discussed with the patient in extensive detail -Given the amount of pain that he is having using chisel blade and a handle as a courtesy the lesion was debrided down to healthy dry tissue.  No complication and no pinpoint bleeding noted Will discuss orthotics option during next visit to offload  No follow-ups on file.

## 2024-01-28 NOTE — Progress Notes (Deleted)
 Subjective:   Patient ID: [name], male   DOB: 58 y.o.   MRN: [id number]   HPI Patient presents with significant lump formation left midfoot that is localized to this area slightly smaller than before still present and also is noted to have thick yellow brittle nailbeds 1-5 both feet that are bothersome for him and he cannot take care of himself with high risk factors on dialysis long-term diabetes   ROS      Objective:  Physical Exam  VAC ocular status was intact neurologically is diminished with thick yellow brittle nailbeds 1-5 both feet and also is noted to have a enlargement in the midfoot left localized nonpainful with incision site around the area     Assessment:  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  Debrided nailbeds 1-5 both feet no iatrogenic bleeding this can be done as needed and he will continue to try to take good care of his health and diabetes and I reviewed all that with him along with daily foot inspections

## 2024-01-30 ENCOUNTER — Ambulatory Visit (HOSPITAL_COMMUNITY)

## 2024-01-31 ENCOUNTER — Encounter (HOSPITAL_COMMUNITY): Payer: Self-pay

## 2024-01-31 ENCOUNTER — Other Ambulatory Visit: Payer: Self-pay

## 2024-01-31 ENCOUNTER — Ambulatory Visit (INDEPENDENT_AMBULATORY_CARE_PROVIDER_SITE_OTHER)

## 2024-01-31 ENCOUNTER — Ambulatory Visit (HOSPITAL_COMMUNITY)
Admission: RE | Admit: 2024-01-31 | Discharge: 2024-01-31 | Disposition: A | Discharge status: Home / Self Care | Source: Ambulatory Visit | Attending: Emergency Medicine | Admitting: Emergency Medicine

## 2024-01-31 VITALS — BP 153/68 | HR 54 | Temp 97.7°F | Resp 18

## 2024-01-31 DIAGNOSIS — M25551 Pain in right hip: Secondary | ICD-10-CM

## 2024-01-31 DIAGNOSIS — W19XXXA Unspecified fall, initial encounter: Secondary | ICD-10-CM | POA: Diagnosis not present

## 2024-01-31 DIAGNOSIS — M25571 Pain in right ankle and joints of right foot: Secondary | ICD-10-CM | POA: Diagnosis not present

## 2024-01-31 NOTE — Discharge Instructions (Signed)
 You were seen today for concerns of hip pain and ankle pain/swelling after a fall a few days ago. Your xrays were negative for any fracture or dislocation. I would recommend continuing to take Tylenol  and icing the area as needed for pain control. Try to stay active and mobile. For any concerns of new or worsening symptoms, return to the the urgent care and follow up with your primary care provider. Go to the ER for concerns of new or worsening symptoms.

## 2024-01-31 NOTE — ED Triage Notes (Signed)
 PT DOB and full name verfied before triage.

## 2024-01-31 NOTE — ED Provider Notes (Signed)
 [facility]    CSN: [id number] Arrival date & time: 01/31/24  1049      History   Chief Complaint Chief Complaint  Patient presents with   Fall    HPI Gilad Jaydence Vanson is a 58 y.o. male.  Patient with a ESRD on hemodialysis presents to urgent care today with concerns of mechanical fall about 1 week ago.  Worsening pain in the right hip with radiation down to the right ankle.  Nursing some swelling to the right ankle as well as a possible hematoma overlying the right hip.  States that he can tolerate weightbearing but somewhat uncomfortable.  Not on blood thinners.   Fall    Past Medical History:  Diagnosis Date   Allergy    Diabetes mellitus    Type 2 DM, Patient reports DM Type 1 diagnosd age at age 42   Diabetic foot ulcers (HCC)    ESRD (end stage renal disease) (HCC)    [facility]   Foot callus 07/01/2018   Foreign body in left foot    with  infection   Hypertension    Meniscus tear    Multiple rib fractures    Personal history of diabetic foot ulcer, Left Foot    Type 2 diabetes mellitus with stage 4 chronic kidney disease, without long-term current use of insulin  (HCC) 06/01/2012    Patient Active Problem List   Diagnosis Date Noted   Encounter for pre-transplant evaluation for kidney transplant 12/13/2022   Positive cardiac stress test 05/02/2022   Trigger finger, left middle finger 01/01/2022   Olecranon bursitis of both elbows 01/25/2021   End stage renal disease (HCC) 01/10/2021   Hyperlipidemia 05/28/2016   Hypertension 04/14/2012   History of diabetes mellitus 04/13/2012    Past Surgical History:  Procedure Laterality Date   A/V FISTULAGRAM Right 03/26/2021   Procedure: A/V FISTULAGRAM;  Surgeon: Adine Hoof, MD;  Location: [facility] INVASIVE CV LAB;  Service: Cardiovascular;  Laterality: Right;   AV FISTULA PLACEMENT Left 11/06/2020   Procedure: LEFT ARM FIRST STAGE BRACHIAL BASILIC ARTERIOVENOUS (AV) FISTULA  CREATION;  Surgeon: Young Hensen, MD;  Location: [facility] OR;  Service: Vascular;  Laterality: Left;   AV FISTULA PLACEMENT Right 12/05/2020   Procedure: RADIOCEPHALIC ARTERIOVENOUS (AV) FISTULA CREATION RIGHT ARM;  Surgeon: Dannis Dy, MD;  Location[facility];  Service: Vascular;  Laterality: Right;   EYE SURGERY Bilateral    lazer   I & D EXTREMITY  04/23/2012   Procedure: IRRIGATION AND DEBRIDEMENT EXTREMITY;  Surgeon: Timothy Ford, MD;  Location: [facility] OR;  Service: Orthopedics;  Laterality: Left;  Irrigation and Debridement Left Foot   I & D EXTREMITY  09/18/2012   Procedure: IRRIGATION AND DEBRIDEMENT EXTREMITY;  Surgeon: Timothy Ford, MD;  Location: [facility] OR;  Service: Orthopedics;  Laterality: Left;   IR PERC TUN PERIT CATH WO PORT S&I /IMAG  08/30/2020   IR REMOVAL TUN CV CATH W/O FL  04/17/2021   IR US  GUIDE VASC ACCESS RIGHT  08/30/2020   LIGATION OF ARTERIOVENOUS  FISTULA Left 12/05/2020   Procedure: LIGATION OF ARTERIOVENOUS  FISTULA LEFT ARM;  Surgeon: Dannis Dy, MD;  Location: [facility] OR;  Service: Vascular;  Laterality: Left;   MENISCUS REPAIR Left 04/2016   Orthopedist, Dr Daldorf in [place], [place]       Home Medications    Prior to Admission medications   Medication Sig Start Date End Date Taking? Authorizing Provider  acetaminophen  (  TYLENOL ) 325 MG tablet Take 650 mg by mouth every 6 (six) hours as needed for mild pain or headache.    Yes [provider]  amLODipine  (NORVASC ) 10 MG tablet Take 10 mg by mouth daily. 11/18/18  Yes [provider]  aspirin  EC 81 MG tablet Take 81 mg by mouth daily. Swallow whole.   Yes [provider]  azelastine  (ASTELIN ) 0.1 % nasal spray Place 1 spray into both nostrils 2 (two) times daily. Use in each nostril as directed 01/08/24  Yes Reddick, Johnathan B, NP  benzonatate  (TESSALON ) 200 MG capsule Take 1 capsule (200 mg total) by mouth 3 (three) times daily as needed for cough. 01/08/24  Yes Reddick,  Johnathan B, NP  calcium  acetate (PHOSLO) 667 MG capsule Take 2,001 mg by mouth 3 (three) times daily. 11/28/20  Yes [provider]  calcium  carbonate (TUMS - DOSED IN MG ELEMENTAL CALCIUM ) 500 MG chewable tablet Chew 1 tablet by mouth daily.   Yes [provider]  carvedilol  (COREG ) 25 MG tablet Take 25 mg by mouth 2 (two) times daily with a meal. 09/29/21  Yes [provider]  cloNIDine  (CATAPRES ) 0.1 MG tablet Take 0.1 mg by mouth 2 (two) times daily. 01/05/24  Yes [provider]  hydrALAZINE  (APRESOLINE ) 25 MG tablet Take 25 mg by mouth 3 (three) times daily.   Yes [provider]  irbesartan (AVAPRO) 300 MG tablet Take 300 mg by mouth daily.   Yes [provider]  lidocaine -prilocaine (EMLA) cream Apply 1 application. topically daily as needed (prior to port being accessed for dialysis). 02/26/21  Yes [provider]  rosuvastatin  (CRESTOR ) 20 MG tablet Take 20 mg by mouth daily.   Yes [provider]    Family History Family History  Problem Relation Age of Onset   Diabetes type II Mother    Arthritis Mother    Diabetes Mother    Hyperlipidemia Mother    Hypertension Mother    Diabetes Father    Diabetes type II Sister    Colon cancer Neg Hx    Stomach cancer Neg Hx    Esophageal cancer Neg Hx    Pancreatic cancer Neg Hx     Social History Social History   Tobacco Use   Smoking status: Former    Current packs/day: 0.00    Types: Cigarettes    Quit date: 09/03/2012    Years since quitting: 11.4   Smokeless tobacco: Former    Quit date: 09/03/2012  Vaping Use   Vaping status: Never Used  Substance Use Topics   Alcohol use: Not Currently   Drug use: Not Currently     Allergies   Latex, Ace inhibitors, and Wound dressing adhesive   Review of Systems Review of Systems  Musculoskeletal:        Right sided hip pain, ankle pain  All other systems reviewed and are negative.    Physical  Exam Triage Vital Signs ED Triage Vitals [01/31/24 1113]  Encounter Vitals Group     BP      Systolic BP Percentile      Diastolic BP Percentile      Pulse      Resp      Temp      Temp src      SpO2      Weight      Height      Head Circumference      Peak Flow  Pain Score 4     Pain Loc      Pain Education      Exclude from Growth Chart    No data found.  Updated Vital Signs BP (!) 153/68   Pulse (!) 54   Temp 97.7 F (36.5 C)   Resp 18   SpO2 91%   Visual Acuity Right Eye Distance:   Left Eye Distance:   Bilateral Distance:    Right Eye Near:   Left Eye Near:    Bilateral Near:     Physical Exam Vitals and nursing note reviewed.  Constitutional:      General: He is not in acute distress.    Appearance: He is well-developed.  HENT:     Head: Normocephalic and atraumatic.  Eyes:     Conjunctiva/sclera: Conjunctivae normal.  Cardiovascular:     Rate and Rhythm: Normal rate and regular rhythm.     Heart sounds: No murmur heard. Pulmonary:     Effort: Pulmonary effort is normal.     Breath sounds: Normal breath sounds.  Abdominal:     Palpations: Abdomen is soft.     Tenderness: There is no abdominal tenderness.  Musculoskeletal:        General: Swelling and tenderness present. No deformity or signs of injury. Normal range of motion.     Cervical back: Neck supple.     Right lower leg: Edema present.       Legs:     Comments: Swelling and tenderness with bruising present over the right lateral hip.  2 pitting edema noted to the right ankle but minimal tenderness and intact ROM.  Skin:    General: Skin is warm and dry.     Capillary Refill: Capillary refill takes less than 2 seconds.  Neurological:     Mental Status: He is alert.  Psychiatric:        Mood and Affect: Mood normal.      UC Treatments / Results  Labs (all labs ordered are listed, but only abnormal results are displayed) Labs Reviewed - No data to  display  EKG   Radiology DG Ankle 2 Views Right Result Date: 01/31/2024 CLINICAL DATA:  Fall 1 week ago.  Right ankle pain. EXAM: RIGHT ANKLE - 2 VIEW COMPARISON:  Right ankle radiographs 04/14/2020 FINDINGS: The ankle is located. No acute or healing fractures are present. Soft swelling is present over the medial malleolus. No underlying fracture is present. Small vessel calcifications are noted. IMPRESSION: Soft tissue swelling over the medial malleolus without underlying fracture. Electronically Signed   By: Audree Leas M.D.   On: 01/31/2024 11:49   DG Pelvis 1-2 Views Result Date: 01/31/2024 CLINICAL DATA:  Right-sided hip pain.  Fall last week. EXAM: PELVIS - 1 VIEW COMPARISON:  None Available. FINDINGS: There is no evidence of pelvic fracture or diastasis. No pelvic bone lesions are seen. Atherosclerotic calcifications are present within the iliac and femoral arteries. IMPRESSION: Negative one-view pelvic radiographs. Electronically Signed   By: Audree Leas M.D.   On: 01/31/2024 11:47    Procedures Procedures (including critical care time)  Medications Ordered in UC Medications - No data to display  Initial Impression / Assessment and Plan / UC Course  I have reviewed the triage vital signs and the nursing notes.  Pertinent labs & imaging results that were available during my care of the patient were reviewed by me and considered in my medical decision making (see chart for details).  Patient with history of ESRD on hemodialysis brought to the urgent care today with concerns of mechanical fall about 1 week ago.  Reports that during the fall, he experienced pain in the right hip and has had worsening pain since then.  Does report he can ambulate and tolerate weightbearing without significant difficulty but is concerned months of the bruising in the area.  He also states that he feels a hard bump to the lateral aspect of his right hip that was not previously  there.  Physical exam reveals patient is able to tolerate weightbearing and ambulation without difficulty.  Small hematoma present to the lateral right hip.  No obvious bony deformity.  Ultane x-rays of the pelvis and right ankle for further assessment.  Ankle with some edema noted worsened than left sided lower extremity edema.  No calf tenderness.  X-rays negative.  Advised continued use of Tylenol  and ice over the area for pain control.  Return precautions discussed. Also advised indications to present in the ED.  Patient stable for outpatient follow-up and discharged home.  Final Clinical Impressions(s) / UC Diagnoses   Final diagnoses:  Right hip pain  Acute right ankle pain  Fall, initial encounter     Discharge Instructions      You were seen today for concerns of hip pain and ankle pain/swelling after a fall a few days ago. Your xrays were negative for any fracture or dislocation. I would recommend continuing to take Tylenol  and icing the area as needed for pain control. Try to stay active and mobile. For any concerns of new or worsening symptoms, return to the the urgent care and follow up with your primary care provider. Go to the ER for concerns of new or worsening symptoms.     ED Prescriptions   None    PDMP not reviewed this encounter.   Jerrel Tiberio A, PA-C 01/31/24 1213

## 2024-01-31 NOTE — ED Triage Notes (Signed)
 PT reports he fell last week Rt side pain downto ankle. Pt reports he has a hematoma ,swelling to rt ankle and bruising to RTside.

## 2024-02-16 ENCOUNTER — Encounter: Payer: Self-pay | Admitting: Podiatry

## 2024-02-16 ENCOUNTER — Ambulatory Visit (INDEPENDENT_AMBULATORY_CARE_PROVIDER_SITE_OTHER): Admitting: Podiatry

## 2024-02-16 DIAGNOSIS — S9031XA Contusion of right foot, initial encounter: Secondary | ICD-10-CM

## 2024-02-16 NOTE — Progress Notes (Unsigned)
 Subjective:   Patient ID: [name], male   DOB: 58 y.o.   MRN: [id number]   HPI Patient is a diabetic who is on dialysis and was concerned about discoloration of the forefoot right over left does not remember specific injury but very possible that he could have traumatized his foot   ROS      Objective:  Physical Exam  Neurovascular status was found to be intact toes are warm no indication of necrosis with bruising of the deep nature right over left in the forefoot with no breakdown of tissue     Assessment:  Probability for contusion of the right over left foot with discoloration of tissue normal with that process     Plan:  H&P spent a great deal time educating patient on the causes of the problem most likely and the fact that I do not see anything here that should be of long-term pathology.  Patient will be seen back as needed all questions answered today

## 2024-02-19 ENCOUNTER — Emergency Department (HOSPITAL_COMMUNITY)

## 2024-02-19 ENCOUNTER — Emergency Department (HOSPITAL_COMMUNITY)
Admission: EM | Admit: 2024-02-19 | Discharge: 2024-02-19 | Disposition: A | Discharge status: Home / Self Care | Attending: Emergency Medicine | Admitting: Emergency Medicine

## 2024-02-19 DIAGNOSIS — Z7982 Long term (current) use of aspirin: Secondary | ICD-10-CM | POA: Insufficient documentation

## 2024-02-19 DIAGNOSIS — M7989 Other specified soft tissue disorders: Secondary | ICD-10-CM | POA: Diagnosis not present

## 2024-02-19 DIAGNOSIS — M79605 Pain in left leg: Secondary | ICD-10-CM | POA: Diagnosis present

## 2024-02-19 DIAGNOSIS — E119 Type 2 diabetes mellitus without complications: Secondary | ICD-10-CM | POA: Diagnosis not present

## 2024-02-19 DIAGNOSIS — Z91040 Latex allergy status: Secondary | ICD-10-CM | POA: Insufficient documentation

## 2024-02-19 LAB — CBC WITH DIFFERENTIAL/PLATELET
Abs Immature Granulocytes: 0.01 10*3/uL (ref 0.00–0.07)
Basophils Absolute: 0.1 10*3/uL (ref 0.0–0.1)
Basophils Relative: 3 %
Eosinophils Absolute: 0.7 10*3/uL — ABNORMAL HIGH (ref 0.0–0.5)
Eosinophils Relative: 15 %
HCT: 36.3 % — ABNORMAL LOW (ref 39.0–52.0)
Hemoglobin: 11.7 g/dL — ABNORMAL LOW (ref 13.0–17.0)
Immature Granulocytes: 0 %
Lymphocytes Relative: 19 %
Lymphs Abs: 0.9 10*3/uL (ref 0.7–4.0)
MCH: 31 pg (ref 26.0–34.0)
MCHC: 32.2 g/dL (ref 30.0–36.0)
MCV: 96 fL (ref 80.0–100.0)
Monocytes Absolute: 0.6 10*3/uL (ref 0.1–1.0)
Monocytes Relative: 12 %
Neutro Abs: 2.3 10*3/uL (ref 1.7–7.7)
Neutrophils Relative %: 51 %
Platelets: 102 10*3/uL — ABNORMAL LOW (ref 150–400)
RBC: 3.78 MIL/uL — ABNORMAL LOW (ref 4.22–5.81)
RDW: 14.6 % (ref 11.5–15.5)
WBC: 4.5 10*3/uL (ref 4.0–10.5)
nRBC: 0 % (ref 0.0–0.2)

## 2024-02-19 LAB — COMPREHENSIVE METABOLIC PANEL WITH GFR
ALT: 19 U/L (ref 0–44)
AST: 19 U/L (ref 15–41)
Albumin: 3.5 g/dL (ref 3.5–5.0)
Alkaline Phosphatase: 148 U/L — ABNORMAL HIGH (ref 38–126)
Anion gap: 15 (ref 5–15)
BUN: 48 mg/dL — ABNORMAL HIGH (ref 6–20)
CO2: 24 mmol/L (ref 22–32)
Calcium: 8.6 mg/dL — ABNORMAL LOW (ref 8.9–10.3)
Chloride: 101 mmol/L (ref 98–111)
Creatinine, Ser: 7.04 mg/dL — ABNORMAL HIGH (ref 0.61–1.24)
GFR, Estimated: 9 mL/min — ABNORMAL LOW (ref >60)
Glucose, Bld: 106 mg/dL — ABNORMAL HIGH (ref 70–99)
Potassium: 4.4 mmol/L (ref 3.5–5.1)
Sodium: 140 mmol/L (ref 135–145)
Total Bilirubin: 1 mg/dL (ref 0.0–1.2)
Total Protein: 7.1 g/dL (ref 6.5–8.1)

## 2024-02-19 MED ORDER — IOHEXOL 350 MG/ML SOLN
75.0000 mL | Freq: Once | INTRAVENOUS | Status: AC | PRN
  Administered 2024-02-19: 75 mL via INTRAVENOUS

## 2024-02-19 NOTE — Discharge Instructions (Signed)
 [name]:  Thank you for allowing us  to take care of you today.  We hope you begin feeling better soon. You were seen today for L inner leg swelling and bruising.  Initially we were concerned that you may have a clot in your leg.  However the the ultrasound reveals no clot.  CT imaging of the leg was also performed.  There is no abscess currently.  We think likely this is a bruise/hematoma.  Please use compression to the area with an Ace wrap.  Please follow-up with your primary care doctor within the next several days for wound recheck.  To-Do:  Please follow-up with your primary doctor within the next 2-3 days. It is important that you review any labs or imaging results (if any) that you had today with them. Your preliminary imaging results (if any) are attached. Please return to the Emergency Department or call 911 if you experience chest pain, shortness of breath, severe pain, severe fever, altered mental status, or have any reason to think that you need emergency medical care.  Thank you again.  Hope you feel better soon.  Arminda Landmark, MD Department of Emergency Medicine

## 2024-02-19 NOTE — ED Provider Notes (Signed)
 [place] EMERGENCY DEPARTMENT AT [facility] Provider Note   CSN: [id number] Arrival date & time: 02/19/24  0941     History  Chief Complaint  Patient presents with   Leg Pain   HPI [name] is a 58 y.o. male with a 30, type 2 diabetes presenting for pain and lumps in the left inner thigh.  States he noticed some bruising about a week ago in the left inner thigh.  He noticed the lumps in that area as well about a couple days ago.  The area is mildly tender.  Denies any trauma.  Denies notable swelling and fever.  States he is up-to-date with his dialysis and went yesterday.  Denies chest pain shortness of breath.  Not currently on a blood thinner aside from heparin  administered before his dialysis.   Leg Pain      Home Medications Prior to Admission medications   Medication Sig Start Date End Date Taking? Authorizing Provider  acetaminophen  (TYLENOL ) 325 MG tablet Take 650 mg by mouth every 6 (six) hours as needed for mild pain or headache.     [provider]  amLODipine  (NORVASC ) 10 MG tablet Take 10 mg by mouth daily. 11/18/18   [provider]  aspirin  EC 81 MG tablet Take 81 mg by mouth daily. Swallow whole.    [provider]  azelastine  (ASTELIN ) 0.1 % nasal spray Place 1 spray into both nostrils 2 (two) times daily. Use in each nostril as directed 01/08/24   Reddick, Johnathan B, NP  benzonatate  (TESSALON ) 200 MG capsule Take 1 capsule (200 mg total) by mouth 3 (three) times daily as needed for cough. 01/08/24   Reddick, Johnathan B, NP  calcium  acetate (PHOSLO) 667 MG capsule Take 2,001 mg by mouth 3 (three) times daily. 11/28/20   [provider]  calcium  carbonate (TUMS - DOSED IN MG ELEMENTAL CALCIUM ) 500 MG chewable tablet Chew 1 tablet by mouth daily.    [provider]  carvedilol  (COREG ) 25 MG tablet Take 25 mg by mouth 2 (two) times daily with a meal. 09/29/21   [provider]  cloNIDine   (CATAPRES ) 0.1 MG tablet Take 0.1 mg by mouth 2 (two) times daily. 01/05/24   [provider]  hydrALAZINE  (APRESOLINE ) 25 MG tablet Take 25 mg by mouth 3 (three) times daily.    [provider]  irbesartan (AVAPRO) 300 MG tablet Take 300 mg by mouth daily.    [provider]  lidocaine -prilocaine (EMLA) cream Apply 1 application. topically daily as needed (prior to port being accessed for dialysis). 02/26/21   [provider]  rosuvastatin  (CRESTOR ) 20 MG tablet Take 20 mg by mouth daily.    [provider]      Allergies    Latex, Ace inhibitors, and Wound dressing adhesive    Review of Systems   See HPI   Physical Exam   Vitals:   02/19/24 0944 02/19/24 1516  BP: (!) 139/58 (!) 175/73  Pulse: (!) 53 (!) 50  Resp: 18 19  Temp: 98 F (36.7 C) 97.6 F (36.4 C)  SpO2: 97% 98%    CONSTITUTIONAL:  well-appearing, NAD NEURO:  Alert and oriented x 3, CN 3-12 grossly intact EYES:  eyes equal and reactive ENT/NECK:  Supple, no stridor  CARDIO:  regular rate and rhythm, appears well-perfused, pedal pulses are 2+ bilaterally. PULM:  No respiratory distress, CTAB GI/GU:  non-distended, soft MSK/SPINE: Ecchymosis noted to the left inner thigh.  3 mobile nonfluctuant masses, noted to the distal end of the ecchymosis just above the knee joint.  Area is not hot to touch, no appreciable edema noted.  Range of motion in the knee hip and ankle were normal. SKIN:  no rash, atraumatic        *Additional and/or pertinent findings included in MDM below   ED Results / Procedures / Treatments   Labs (all labs ordered are listed, but only abnormal results are displayed) Labs Reviewed  CBC WITH DIFFERENTIAL/PLATELET - Abnormal; Notable for the following components:      Result Value   RBC 3.78 (*)    Hemoglobin 11.7 (*)    HCT 36.3 (*)    Platelets 102 (*)    Eosinophils Absolute 0.7 (*)    All other components within normal limits   COMPREHENSIVE METABOLIC PANEL WITH GFR - Abnormal; Notable for the following components:   Glucose, Bld 106 (*)    BUN 48 (*)    Creatinine, Ser 7.04 (*)    Calcium  8.6 (*)    Alkaline Phosphatase 148 (*)    GFR, Estimated 9 (*)    All other components within normal limits    EKG None  Radiology VAS US  LOWER EXTREMITY VENOUS (DVT) (7a-7p) Result Date: 02/19/2024  Lower Venous DVT Study Patient Name:  [name]  Date of Exam:   02/19/2024 Medical Rec #: [id number]           Accession #:    [id number] Date of Birth: June 10, 1966           Patient Gender: M Patient Age:   20 years Exam Location:  [facility] Procedure:      VAS US  LOWER EXTREMITY VENOUS (DVT) Referring Phys: Russella Courts --------------------------------------------------------------------------------  Indications: Pain, and localized bumps/swelling.  Comparison Study: Previous exam on 07/16/2016 was negative for DVT. Performing Technologist: Arlyce Berger RVT, RDMS  Examination Guidelines: A complete evaluation includes B-mode imaging, spectral Doppler, color Doppler, and power Doppler as needed of all accessible portions of each vessel. Bilateral testing is considered an integral part of a complete examination. Limited examinations for reoccurring indications may be performed as noted. The reflux portion of the exam is performed with the patient in reverse Trendelenburg.  +-----+---------------+---------+-----------+----------+--------------+ RIGHTCompressibilityPhasicitySpontaneityPropertiesThrombus Aging +-----+---------------+---------+-----------+----------+--------------+ CFV  Full           Yes      Yes                                 +-----+---------------+---------+-----------+----------+--------------+   +--------+---------------+---------+-----------+----------+--------------------+ LEFT    CompressibilityPhasicitySpontaneityPropertiesThrombus Aging        +--------+---------------+---------+-----------+----------+--------------------+ CFV     Full           No       Yes        pulsatile                      +--------+---------------+---------+-----------+----------+--------------------+ SFJ     Full                                                              +--------+---------------+---------+-----------+----------+--------------------+ FV Prox Full           Yes  Yes                                       +--------+---------------+---------+-----------+----------+--------------------+ FV Mid  Full           Yes      Yes                                       +--------+---------------+---------+-----------+----------+--------------------+ FV      Full           Yes      Yes                                       Distal                                                                    +--------+---------------+---------+-----------+----------+--------------------+ PFV                    No       Yes        pulsatile patent by                                                                 color/doppler        +--------+---------------+---------+-----------+----------+--------------------+ POP     Full           Yes      Yes                                       +--------+---------------+---------+-----------+----------+--------------------+ PTV     Full                                                              +--------+---------------+---------+-----------+----------+--------------------+ PERO    Full                                                              +--------+---------------+---------+-----------+----------+--------------------+     Summary: RIGHT: - There is no evidence of deep vein thrombosis in the lower extremity.  - Ultrasound characteristics of enlarged lymph nodes are noted in the groin. Mildly enlarged lymph node seen in medial distal thigh. Cystic appearing  structures noted in proximal and distal medial thigh at areas of pain with surrounding subcutaneous edema. May  benefit from further imaging if clincally indicated.  LEFT: - No evidence of common femoral vein obstruction.  - Ultrasound characteristics of enlarged lymph nodes noted in the groin.  *See table(s) above for measurements and observations.    Preliminary     Procedures Procedures    Medications Ordered in ED Medications - No data to display  ED Course/ Medical Decision Making/ A&P Clinical Course as of 02/19/24 1613  Thu Feb 19, 2024  1520 S; hx ESRD; here with L inner thigh ecchymosis; DVT study negative; f/u CT due to concern for infection  [WC]    Clinical Course User Index [WC] Arminda Landmark, MD                                 Medical Decision Making Amount and/or Complexity of Data Reviewed Radiology: ordered.   58 year old appearing male presenting for leg pain.  Exam notable for what appeared to be large hematoma to the left inner thigh.  Findings do not necessarily suggest infection but cannot definitively rule it out.  DVT study was negative but did reveal concern for lymphadenopathy in the left upper leg raising suspicion for infection.  This prompted further evaluation with contrasted CT of the left upper leg.  Studies pending.  Labs reassuring.  Signed out patient to oncoming ED resident [facility].  If CT is unremarkable patient can be discharged with PCP follow-up.        Final Clinical Impression(s) / ED Diagnoses Final diagnoses:  Left leg pain    Rx / DC Orders ED Discharge Orders     None         Janalee Mcmurray, PA-C 02/19/24 1613    Tegeler, Marine Sia, MD 02/19/24 [phone]

## 2024-02-19 NOTE — Progress Notes (Signed)
 LLE venous duplex has been completed.  Preliminary results given to Dr. Martina Sledge.  Results can be found under chart review under CV PROC. 02/19/2024 12:28 PM Evadna Donaghy RVT, RDMS

## 2024-02-19 NOTE — ED Provider Triage Note (Signed)
 Emergency Medicine Provider Triage Evaluation Note  [name] , a 58 y.o. male  was evaluated in triage.  Pt complains of left leg pain  Pt with painful lumps to prox thigh, seems to be worsening. Bruising noted to left prox thigh last 24 hours. ESRD MWF, full session yestd .  Review of Systems  Positive: Increased bruising Negative: dib  Physical Exam  BP (!) 139/58 (BP Location: Right Arm)   Pulse (!) 53   Temp 98 F (36.7 C)   Resp 18   SpO2 97%  Gen:   Awake, no distress   Resp:  Normal effort  MSK:   Moves extremities without difficulty  Other:  Bruising to prox thigh posterior left. Lumps to prox thigh, tender, mobile    Medical Decision Making  Medically screening exam initiated at 10:16 AM.  Appropriate orders placed.  [name] was informed that the remainder of the evaluation will be completed by another provider, this initial triage assessment does not replace that evaluation, and the importance of remaining in the ED until their evaluation is complete.     Earl Favors, DO 02/19/24 1024

## 2024-02-19 NOTE — ED Provider Notes (Signed)
 Assume Care - Medical Decision Making  Care of patient assumed from previous emergency medicine provider. See their note for further details of history, physical exam and plan.  Briefly, [name] is a 58 y.o. male who presents as below:  Clinical Course as of 02/19/24 1835  Thu Feb 19, 2024  1520 S; hx ESRD; here with L inner thigh ecchymosis; DVT study negative; f/u CT due to concern for infection  [WC]  1809 CT femur reveals evidence of subcutaneous fluid which could represent edema or cellulitis. [WC]    Clinical Course User Index [WC] Arminda Landmark, MD     Reassessment:  I personally reassessed the patient: Vital Signs:  The most current vitals were:  Vitals:   02/19/24 0944 02/19/24 1516  BP: (!) 139/58 (!) 175/73  Pulse: (!) 53 (!) 50  Resp: 18 19  Temp: 98 F (36.7 C) 97.6 F (36.4 C)  SpO2: 97% 98%     Hemodynamics:  The patient is hemodynamically stable. Mental Status:  The patient is alert   Additional MDM/ED Course: CT femur reveals evidence of subcutaneous fluid which could represent edema or cellulitis.  On exam patient has no erythema or severe pain.  Therefore I feel cellulitis is not likely.  Patient also does not have leukocytosis.  He is afebrile.  Patient does have bruising in this area.  Picture in the media tab.  I offered to the patient expectant management.  We discussed wrapping the extremity with an Ace wrap to provide compression.  He was agreeable to this plan.  Patient discharged with instruction to follow-up with his primary care doctor.   Arminda Landmark, MD 02/19/24 [name]    Earl Lee, MD 02/24/24 [phone]

## 2024-02-19 NOTE — ED Notes (Signed)
 Education reviewed with pt.Ace wrap applied to leg. Pt leaving with significant other for home. No new onset distress noted.

## 2024-02-19 NOTE — ED Triage Notes (Signed)
 Pt states that for the last week he's had painful swollen lumps to his L inner thigh. Pt also has bruising to the area of unknown origin. Pt is MWF dialysis pt with last tx yesterday. No SOB per pt. No blood thinners or hx of blood clots.

## 2024-04-19 ENCOUNTER — Encounter (HOSPITAL_COMMUNITY)
Admission: RE | Disposition: A | Discharge status: Home / Self Care | Payer: Self-pay | Source: Ambulatory Visit | Attending: Vascular Surgery

## 2024-04-19 ENCOUNTER — Ambulatory Visit (HOSPITAL_COMMUNITY)
Admission: RE | Admit: 2024-04-19 | Discharge: 2024-04-19 | Disposition: A | Discharge status: Home / Self Care | Source: Ambulatory Visit | Attending: Vascular Surgery | Admitting: Vascular Surgery

## 2024-04-19 ENCOUNTER — Other Ambulatory Visit: Payer: Self-pay

## 2024-04-19 DIAGNOSIS — Z5986 Financial insecurity: Secondary | ICD-10-CM | POA: Diagnosis not present

## 2024-04-19 DIAGNOSIS — T82590A Other mechanical complication of surgically created arteriovenous fistula, initial encounter: Secondary | ICD-10-CM | POA: Insufficient documentation

## 2024-04-19 DIAGNOSIS — Y832 Surgical operation with anastomosis, bypass or graft as the cause of abnormal reaction of the patient, or of later complication, without mention of misadventure at the time of the procedure: Secondary | ICD-10-CM | POA: Diagnosis not present

## 2024-04-19 DIAGNOSIS — E1022 Type 1 diabetes mellitus with diabetic chronic kidney disease: Secondary | ICD-10-CM | POA: Insufficient documentation

## 2024-04-19 DIAGNOSIS — Z87891 Personal history of nicotine dependence: Secondary | ICD-10-CM | POA: Insufficient documentation

## 2024-04-19 DIAGNOSIS — T82898A Other specified complication of vascular prosthetic devices, implants and grafts, initial encounter: Secondary | ICD-10-CM | POA: Diagnosis not present

## 2024-04-19 DIAGNOSIS — I120 Hypertensive chronic kidney disease with stage 5 chronic kidney disease or end stage renal disease: Secondary | ICD-10-CM | POA: Insufficient documentation

## 2024-04-19 DIAGNOSIS — Z794 Long term (current) use of insulin: Secondary | ICD-10-CM | POA: Insufficient documentation

## 2024-04-19 DIAGNOSIS — N186 End stage renal disease: Secondary | ICD-10-CM | POA: Diagnosis not present

## 2024-04-19 HISTORY — PX: A/V FISTULAGRAM: CATH118298

## 2024-04-19 LAB — GLUCOSE, CAPILLARY: Glucose-Capillary: 88 mg/dL (ref 70–99)

## 2024-04-19 SURGERY — A/V FISTULAGRAM
Anesthesia: LOCAL

## 2024-04-19 MED ORDER — LIDOCAINE HCL (PF) 1 % IJ SOLN
INTRAMUSCULAR | Status: AC
  Filled 2024-04-19: qty 30

## 2024-04-19 MED ORDER — HEPARIN (PORCINE) IN NACL 1000-0.9 UT/500ML-% IV SOLN
INTRAVENOUS | Status: DC | PRN
  Administered 2024-04-19: 500 mL

## 2024-04-19 MED ORDER — LIDOCAINE HCL (PF) 1 % IJ SOLN
INTRAMUSCULAR | Status: DC | PRN
  Administered 2024-04-19: 5 mL

## 2024-04-19 MED ORDER — IODIXANOL 320 MG/ML IV SOLN
INTRAVENOUS | Status: DC | PRN
  Administered 2024-04-19: 20 mL via INTRAVENOUS

## 2024-04-19 SURGICAL SUPPLY — 4 items
KIT MICROPUNCTURE NIT STIFF (SHEATH) ×1 IMPLANT
STOPCOCK MORSE 400PSI 3WAY (MISCELLANEOUS) ×1 IMPLANT
TRAY PV CATH (CUSTOM PROCEDURE TRAY) ×2 IMPLANT
TUBING CIL FLEX 10 FLL-RA (TUBING) ×1 IMPLANT

## 2024-04-19 NOTE — H&P (Signed)
 Hospital Consult    Reason for Consult: Right arm AV fistula malfunction Requesting Physician: Nephrology MRN #:  [id number]  History of Present Illness: This is a 58 y.o. male with end-stage renal disease currently being dialyzed through a right arm radiocephalic fistula.  The fistula has been working well, nursing recently moved access spots, resulting in infiltration.  Per [employer], his last dialysis run went okay, but there was concern about low flows prior.  His dialysis access center nephrologist asked for fistulogram to ensure that there was no flow-limiting stenosis.  Denies history of steal syndrome Wants to do everything possible to prevent moving the fistula more proximally to the antecubital fossa.  Past Medical History:  Diagnosis Date   Allergy    Diabetes mellitus    Type 2 DM, Patient reports DM Type 1 diagnosd age at age 42   Diabetic foot ulcers (HCC)    ESRD (end stage renal disease) (HCC)    [facility]   Foot callus 07/01/2018   Foreign body in left foot    with  infection   Hypertension    Meniscus tear    Multiple rib fractures    Personal history of diabetic foot ulcer, Left Foot    Type 2 diabetes mellitus with stage 4 chronic kidney disease, without long-term current use of insulin  (HCC) 06/01/2012    Past Surgical History:  Procedure Laterality Date   A/V FISTULAGRAM Right 03/26/2021   Procedure: A/V FISTULAGRAM;  Surgeon: Sheree Penne Bruckner, MD;  Location: [facility] INVASIVE CV LAB;  Service: Cardiovascular;  Laterality: Right;   AV FISTULA PLACEMENT Left 11/06/2020   Procedure: LEFT ARM FIRST STAGE BRACHIAL BASILIC ARTERIOVENOUS (AV) FISTULA CREATION;  Surgeon: Gretta Bruckner PARAS, MD;  Location: [facility] OR;  Service: Vascular;  Laterality: Left;   AV FISTULA PLACEMENT Right 12/05/2020   Procedure: RADIOCEPHALIC ARTERIOVENOUS (AV) FISTULA CREATION RIGHT ARM;  Surgeon: Eliza Bruckner RAMAN, MD;  Location: [facility] OR;  Service: Vascular;  Laterality:  Right;   EYE SURGERY Bilateral    lazer   I & D EXTREMITY  04/23/2012   Procedure: IRRIGATION AND DEBRIDEMENT EXTREMITY;  Surgeon: Jerona LULLA Sage, MD;  Location: [facility] OR;  Service: Orthopedics;  Laterality: Left;  Irrigation and Debridement Left Foot   I & D EXTREMITY  09/18/2012   Procedure: IRRIGATION AND DEBRIDEMENT EXTREMITY;  Surgeon: Jerona LULLA Sage, MD;  Location: [facility] OR;  Service: Orthopedics;  Laterality: Left;   IR PERC TUN PERIT CATH WO PORT S&I /IMAG  08/30/2020   IR REMOVAL TUN CV CATH W/O FL  04/17/2021   IR US  GUIDE VASC ACCESS RIGHT  08/30/2020   LIGATION OF ARTERIOVENOUS  FISTULA Left 12/05/2020   Procedure: LIGATION OF ARTERIOVENOUS  FISTULA LEFT ARM;  Surgeon: Eliza Bruckner RAMAN, MD;  Location: [facility] OR;  Service: Vascular;  Laterality: Left;   MENISCUS REPAIR Left 04/2016   Orthopedist, Dr Lilly in [place], [place]    Allergies  Allergen Reactions   Latex Rash and Other (See Comments)    Pt states he is allergic to latex condoms   Ace Inhibitors Cough   Wound Dressing Adhesive Itching and Rash    Allergy to tape adhesive    Prior to Admission medications   Medication Sig Start Date End Date Taking? Authorizing Provider  acetaminophen  (TYLENOL ) 325 MG tablet Take 650 mg by mouth every 6 (six) hours as needed for mild pain or headache.     [provider]  amLODipine  (NORVASC ) 10 MG tablet Take  10 mg by mouth daily. 11/18/18   [provider]  aspirin  EC 81 MG tablet Take 81 mg by mouth daily. Swallow whole.    [provider]  azelastine  (ASTELIN ) 0.1 % nasal spray Place 1 spray into both nostrils 2 (two) times daily. Use in each nostril as directed 01/08/24   Reddick, Johnathan B, NP  benzonatate  (TESSALON ) 200 MG capsule Take 1 capsule (200 mg total) by mouth 3 (three) times daily as needed for cough. 01/08/24   Reddick, Johnathan B, NP  calcium  acetate (PHOSLO) 667 MG capsule Take 2,001 mg by mouth 3 (three) times daily. 11/28/20   [provider]  calcium  carbonate (TUMS - DOSED IN MG ELEMENTAL CALCIUM ) 500 MG chewable tablet Chew 1 tablet by mouth daily.    [provider]  carvedilol  (COREG ) 25 MG tablet Take 25 mg by mouth 2 (two) times daily with a meal. 09/29/21   [provider]  cloNIDine  (CATAPRES ) 0.1 MG tablet Take 0.1 mg by mouth 2 (two) times daily. 01/05/24   [provider]  hydrALAZINE  (APRESOLINE ) 25 MG tablet Take 25 mg by mouth 3 (three) times daily.    [provider]  irbesartan (AVAPRO) 300 MG tablet Take 300 mg by mouth daily.    [provider]  lidocaine -prilocaine (EMLA) cream Apply 1 application. topically daily as needed (prior to port being accessed for dialysis). 02/26/21   [provider]  rosuvastatin  (CRESTOR ) 20 MG tablet Take 20 mg by mouth daily.    [provider]    Social History   Socioeconomic History   Marital status: Married    Spouse name: Not on file   Number of children: Not on file   Years of education: Not on file   Highest education level: Associate degree: occupational, Scientist, product/process development, or vocational program  Occupational History   Not on file  Tobacco Use   Smoking status: Former    Current packs/day: 0.00    Types: Cigarettes    Quit date: 09/03/2012    Years since quitting: 11.6   Smokeless tobacco: Former    Quit date: 09/03/2012  Vaping Use   Vaping status: Never Used  Substance and Sexual Activity   Alcohol use: Not Currently   Drug use: Not Currently   Sexual activity: Yes  Other Topics Concern   Not on file  Social History Narrative   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    Social Drivers of Health   Financial Resource Strain: Medium Risk (01/01/2024)   Overall Financial Resource Strain (CARDIA)    Difficulty of Paying Living Expenses: Somewhat hard  Food Insecurity: No Food Insecurity (01/01/2024)   Hunger Vital Sign    Worried About Running Out of Food  in the Last Year: Never true    Ran Out of Food in the Last Year: Never true  Transportation Needs: No Transportation Needs (01/01/2024)   PRAPARE - Administrator, Civil Service (Medical): No    Lack of Transportation (Non-Medical): No  Physical Activity: Insufficiently Active (01/01/2024)   Exercise Vital Sign    Days of Exercise per Week: 4 days    Minutes of Exercise per Session: 30 min  Stress: No Stress Concern Present (01/01/2024)   [employer] of Occupational Health - Occupational Stress Questionnaire    Feeling of Stress : Only a little  Social Connections: Moderately Integrated (01/01/2024)   Social Connection and Isolation Panel  Frequency of Communication with Friends and Family: Three times a week    Frequency of Social Gatherings with Friends and Family: Twice a week    Attends Religious Services: 1 to 4 times per year    Active Member of [employer] or Organizations: No    Attends Engineer, structural: Not on file    Marital Status: Married  Catering manager Violence: Not on file    Family History  Problem Relation Age of Onset   Diabetes type II Mother    Arthritis Mother    Diabetes Mother    Hyperlipidemia Mother    Hypertension Mother    Diabetes Father    Diabetes type II Sister    Colon cancer Neg Hx    Stomach cancer Neg Hx    Esophageal cancer Neg Hx    Pancreatic cancer Neg Hx     ROS: Otherwise negative unless mentioned in HPI  Physical Examination  Vitals:   04/19/24 0729 04/19/24 0744  BP: (!) 153/67 (!) 153/67  Pulse: (!) 49 (!) 49  Resp: 12 14  Temp: 98.1 F (36.7 C)   SpO2: 93% 96%   There is no height or weight on file to calculate BMI.  General:  WDWN in NAD Gait: Not observed HENT: WNL, normocephalic Pulmonary: normal non-labored breathing, without Rales, rhonchi,  wheezing Cardiac: regular Abdomen:  soft, NT/ND, no masses Skin: without rashes Vascular Exam/Pulses: Soft thrill right radiocephalic  fistula Extremities: without ischemic changes, without Gangrene , without cellulitis; without open wounds;  Musculoskeletal: no muscle wasting or atrophy  Neurologic: A&O X 3;  No focal weakness or paresthesias are detected; speech is fluent/normal Psychiatric:  The pt has Normal affect. Lymph:  Unremarkable  CBC    Component Value Date/Time   WBC 4.5 02/19/2024 1025   RBC 3.78 (L) 02/19/2024 1025   HGB 11.7 (L) 02/19/2024 1025   HGB 12.9 (L) 10/20/2020 1341   HCT 36.3 (L) 02/19/2024 1025   HCT 39.6 10/20/2020 1341   PLT 102 (L) 02/19/2024 1025   PLT 267 10/20/2020 1341   MCV 96.0 02/19/2024 1025   MCV 95 10/20/2020 1341   MCH 31.0 02/19/2024 1025   MCHC 32.2 02/19/2024 1025   RDW 14.6 02/19/2024 1025   RDW 13.2 10/20/2020 1341   LYMPHSABS 0.9 02/19/2024 1025   LYMPHSABS 1.8 07/25/2020 1559   MONOABS 0.6 02/19/2024 1025   EOSABS 0.7 (H) 02/19/2024 1025   EOSABS 0.7 (H) 07/25/2020 1559   BASOSABS 0.1 02/19/2024 1025   BASOSABS 0.1 07/25/2020 1559    BMET    Component Value Date/Time   NA 140 02/19/2024 1025   NA 144 08/15/2020 1524   K 4.4 02/19/2024 1025   CL 101 02/19/2024 1025   CO2 24 02/19/2024 1025   GLUCOSE 106 (H) 02/19/2024 1025   BUN 48 (H) 02/19/2024 1025   BUN 96 (HH) 08/15/2020 1524   CREATININE 7.04 (H) 02/19/2024 1025   CREATININE 0.97 10/21/2012 0957   CALCIUM  8.6 (L) 02/19/2024 1025   CALCIUM  7.5 (L) 07/27/2020 0319   GFRNONAA 9 (L) 02/19/2024 1025   GFRAA 7 (L) 08/15/2020 1524    COAGS: Lab Results  Component Value Date   INR 1.08 07/16/2016   INR 0.99 10/13/2014   INR 1.78 (H) 09/21/2012      ASSESSMENT/PLAN: This is a 58 y.o. male with right arm fistula malfunction.  After discussing the risks and benefits of right arm radiocephalic fistulogram in an effort to define, and possibly  improve flow through the fistula for continued HD access, Thelbert elected to proceed.    Fonda FORBES Rim MD [facility] [phone] 04/19/2024  8:11 AM

## 2024-04-19 NOTE — Op Note (Addendum)
    Patient name: [name] MRN: [id number] DOB: 04-Jul-1966 Sex: male  04/19/2024 Pre-operative Diagnosis: Left arm fistula malfunction Post-operative diagnosis:  Same Surgeon:  Fonda FORBES Rim, MD Procedure Performed: 1.  Ultrasound-guided micropuncture access of the right arm radiocephalic fistula 2.  Fistulogram, central venogram 3.  Contrast volume 20 mL  Indications: Patient is a 58 year old male with history of right arm radiocephalic fistula creation several years ago.  Recently had an infiltration episode with low flows.  He was asked to present to our office for fistulogram by his nephrologist.  After discussing the risks and benefits of right arm fistulogram in effort to define and, possibly improve flow through the fistula, [name] elected to proceed.  Findings:  Widely patent radiocephalic fistula with filling of the superficial and deep systems at the antecubital fossa. Widely patent arterial anastomosis. No flow-limiting stenosis centrally in the subclavian vein, innominate vein.   Procedure:  The patient was identified in the holding area and taken to room 8.  The patient was then placed supine on the table and prepped and draped in the usual sterile fashion.  A time out was called.  Ultrasound was used to evaluate the right arm brachiocephalic fistula.  This was accessed using a micropuncture needle, which was then exchanged for a micropuncture sheath.  Fistulogram followed.   See results above.  Impression: Widely patent right arm radiocephalic fistula. No intervention performed.  Fonda FORBES Rim MD [facility] [place] Office: [phone]

## 2024-04-20 ENCOUNTER — Encounter (HOSPITAL_COMMUNITY): Payer: Self-pay | Admitting: Vascular Surgery

## 2024-04-29 ENCOUNTER — Ambulatory Visit: Admitting: Family Medicine

## 2024-04-29 ENCOUNTER — Telehealth: Payer: Self-pay | Admitting: Family Medicine

## 2024-04-29 VITALS — BP 156/70 | HR 48 | Ht 70.5 in | Wt 203.4 lb

## 2024-04-29 DIAGNOSIS — I2510 Atherosclerotic heart disease of native coronary artery without angina pectoris: Secondary | ICD-10-CM | POA: Insufficient documentation

## 2024-04-29 DIAGNOSIS — T148XXA Other injury of unspecified body region, initial encounter: Secondary | ICD-10-CM | POA: Diagnosis not present

## 2024-04-29 DIAGNOSIS — R059 Cough, unspecified: Secondary | ICD-10-CM | POA: Diagnosis not present

## 2024-04-29 DIAGNOSIS — U071 COVID-19: Secondary | ICD-10-CM

## 2024-04-29 MED ORDER — NIRMATRELVIR/RITONAVIR (PAXLOVID) TABLET (RENAL DOSING): 1.0000 | ORAL_TABLET | ORAL | 0 refills | Status: DC

## 2024-04-29 MED ORDER — NIRMATRELVIR/RITONAVIR (PAXLOVID) TABLET (RENAL DOSING): ORAL_TABLET | ORAL | 0 refills | Status: DC

## 2024-04-29 NOTE — Telephone Encounter (Signed)
 After Hours/Emergency Line Call  Received an after hours page to call [phone] Patient: [name] Caller: Self  Confirmed name & DOB of patient with caller.  SUBJECTIVE: [name] is a 58 y.o. male with pertinent past medical history of ESRD who calls with the following complaints and concerns:  COVID-19 infection Was seen by Dr. Damien Cassis earlier in clinic and diagnosed with COVID-19. Prescribed the following regimen with renal adjustment given ESRD: DAY 1: Take 300 mg nirmatrelvir  (2 x 150 mg tablets) with 100 mg ritonavir  (1 x 100 mg tablet).   DAY 2-5: Take 150 mg nirmatrelvir  (1 x 150 mg tablets) with 100 mg ritonavir  (1 x 100 mg tablet).  Patient reports that the pharmacy was unable to fill prescription because of error, requests adjustment of orders.  Doing well otherwise, unchanged from clinic visit.   OBJECTIVE: Observations: NAD    ASSESSMENT & PLAN  COVID-19 infection Called Dr. Cassis, who is wrapping up in clinic and spoke with her briefly. She reached out to pharmacy and clarified order with them directly, prescription plan confirmed. Appreciate Dr. Sherre prompt response and assistance!  - Red flags discussed, ED precautions addressed   - Will forward to PCP  Brenlee Koskela Toma, MD PGY-2, [facility] 04/29/24, 5:39 PM

## 2024-04-29 NOTE — Progress Notes (Signed)
    SUBJECTIVE:   CHIEF COMPLAINT / HPI:   [name] is here for a chief complaint of cough.  Cough The symptoms started with a cough on Monday morning and have progressively worsened. He reports coughing up green phlegm, no blood. Has experienced body aches and felt feverish (temp 99 yesterday). He also reports no appetite and vomits after eating, particularly when the coughing is triggered post-meal. He has been vomiting after solid foods, but he is able to keep fluids down. Has 32 oz daily restriction for ESRD on HD. He has been self-treating with Tylenol  and throat spray. He denies current fever today. Patient has also had to skip his dialysis on Monday d/t his illness and opted to go on Tuesday instead, next dialysis session tomorrow. Patient notes that his wife had similar symptoms recently and has since improved.  Bumps, bruising Also reports various bumps that have appeared under his skin over the past few months.  The hard bumps appear and then bruising occurs thereafter.  No trauma.  The bumps are not painful.  The bumps have come and gone.  Currently, 1 bump/bruise is present on the chest, has been there for 3 weeks.  Pertinent PMH: ESRD on HD, CAD, T2DM, HTN  OBJECTIVE:   BP (!) 156/70   Pulse (!) 48   Ht 5' 10.5 (1.791 m)   Wt 203 lb 6.4 oz (92.3 kg)   SpO2 96%   BMI 28.77 kg/m   General: Appears ill but in no acute distress HEENT: MMM. No visible nasal congestion or sinus tenderness. Exudate noted on right tonsil. Non-erythematous, non-bulging TM bilaterally. No significant cervical lymphadenopathy.  Lungs: normal WOB on RA. CTAB. No wheezes or crackles. Cardiac: Regular S1/S2. Warm and well-perfused.  Abdomen: Soft, NT/ND, BS normoactive. Skin: Firm, nontender, mobile 1cm nodule beneath skin of R upper chest, with surrounding nontender ecchymoses.    ASSESSMENT/PLAN:   Assessment & Plan COVID-19 Found to be COVID+. No evidence of pneumonia or severe dehydration at  this time. Patient is currently afebrile. - Renally dosed paxlovid  as below - discussed with patient & patient's wife DAY 1: Take 300 mg nirmatrelvir  (2 x 150 mg tablets) with 100 mg ritonavir  (1 x 100 mg tablet).  DAY 2-5: Take 150 mg nirmatrelvir  (1 x 150 mg tablets) with 100 mg ritonavir  (1 x 100 mg tablet). - Discussed holding rosuvastatin  until 5 days after finishing course above  - Discussed monitoring BP while on regimen above - Notified by after hours line provider regarding patient pharmacy inquiry - called and spoke with pharmacist to clarify dosing/filling  - Supportive care: rest, hydration, tylenol , honey for cough - ED precautions discussed  Bruising Unclear etiology at this time. Ddx includes calcium  deposit, simple cyst, and lipoma though bruising is unusual with these origins.  - Discussed returning to clinic for further evaluation after patient is feeling better from COVID illness    [name], Medical Student [facility]   I have performed or re-performed the history, physical exam and medical decision making activities of this service. I agree with the student's documentation and have made all necessary edits.  Earl Cassis, MD                  04/29/2024, 5:51 PM

## 2024-04-29 NOTE — Patient Instructions (Addendum)
 Thank you for visiting clinic today and allowing us  to participate in your care!  You tested positive for COVID. Please take the following medications as below:  DAY 1: Take 300 mg nirmatrelvir  (2 x 150 mg tablets) with 100 mg ritonavir  (1 x 100 mg tablet).   DAY 2-5: Take 150 mg nirmatrelvir  (1 x 150 mg tablets) with 100 mg ritonavir  (1 x 100 mg tablet).  **Stop taking your rosuvastatin  now. Resume taking your rosuvastatin  5 days after you finish the medications above.   **If it is a dialysis day, please take your medications after dialysis.   **Please keep an eye on your blood pressure. Let us  know if you experience changes.    Try your best to stay hydrated and get lots of rest. You may try taking honey to help with your cough. If you develop chest pain or difficulty breathing, please visit the ED.   Please schedule an appointment to have your bump re-evaluated after you're feeling better from COVID.   Reach out any time with any questions or concerns you may have - we are here for you!  Damien Cassis, MD [facility] [phone]

## 2024-04-30 LAB — POC SOFIA SARS ANTIGEN FIA: SARS Coronavirus 2 Ag: POSITIVE — AB

## 2024-05-04 NOTE — Progress Notes (Deleted)
 [facility] CONSULT NOTE  Patient Care Team: Diona Perkins, MD as PCP - General Kate Lonni CROME, MD as PCP - Cardiology (Cardiology) Dialysis, [facility]  ASSESSMENT & PLAN 58 y.o.Salinas with history of DM2, HTN, ESRD on hemodialysis, heart failure with preserved ejection fraction and grade 2 diastolic dysfunction, proliferative diabetic retinopathy requiring eye injections, peripheral vascular disease, aortic atherosclerosis and diffuse coronary atherosclerotic disease referred to Hematology for thrombocytopenia.  The etiology is not clear at this time and requires further testing and investigation.  Assessment & Plan   CBC, blood smear to rule out clumping HIV, hepatitis C ab and CMP, LDH B12, copper, folate PT, APTT History of thrombosis with thrombocytopenia will rule out PNH ANA if signs or symptoms of autoimmune disease Bone marrow aspiration with biopsy if other causes ruled out and primary hematologic disorder is suspected  No orders of the defined types were placed in this encounter.  All questions were answered. The patient knows to call the clinic with any problems, questions or concerns. No barriers to learning was detected. The total time spent in the appointment was {CHL ONC TIME VISIT - [id number]} encounter with patients including review of chart and various tests results, discussions about plan of care and coordination of care plan  [name], MD 05/04/2024 7:41 AM  CHIEF COMPLAINTS/PURPOSE OF CONSULTATION:  ***  HISTORY OF PRESENTING ILLNESS:  [name] is here because of thrombocytopenia.  ***He was found to have abnormal CBC from *** ***He denies recent bruising/bleeding, such as spontaneous epistaxis, hematuria, melena or hematochezia ***She denies recent excessive menorrhagia The patient denies history of liver disease, exposure to heparin , history of cardiac murmur/prior cardiovascular surgery or  recent new medications He denies prior blood or platelet transfusions  Drug induced thrombocytopenia: {Yes/No:30480221}: Heparin  {Yes/No:30480221}: Quinine as over-the-counter tablet for leg cramps or tonic water, gin and tonic {Yes/No:30480221}: Sulfonamide {Yes/No:30480221}: Acetaminophen  {Yes/No:30480221}: Cimetidine {Yes/No:30480221}: Ibuprofen or other NSAIDs {Yes/No:30480221}: Ampicillin {Yes/No:30480221}: Piperacillin  {Yes/No:30480221}: Vancomycin  {Yes/No:30480221}: GP IIb/IIIa inhibitor {Yes/No:30480221}: Seizure medication: Carbamazepine, phenytoin  Infectious cause: {Yes/No:30480221}: History of HIV {Yes/No:30480221}: Hepatitis C {Yes/No:30480221}: EBV or infectious mononucleosis {Yes/No:30480221}: H pylori {Yes/No:30480221}: Recent sepsis or infection {Yes/No:30480221}: Traveling history suggest parasites exposure  {Yes/No:30480221}: Heavy alcohol use {Yes/No:30480221}: Dietary imbalance: Vitamin B12, folate, copper deficiency {Yes/No:30480221}: Autoimmune disease {Yes/No:30480221}: Pregnancy {Yes/No:30480221}: Family history of thrombocytopenia {Yes/No:30480221}: History of thrombosis {Yes/No:30480221}: History of bleeding   MEDICAL HISTORY:  Past Medical History:  Diagnosis Date   Allergy    Diabetes mellitus    Type 2 DM, Patient reports DM Type 1 diagnosd age at age 39   Diabetic foot ulcers (HCC)    ESRD (end stage renal disease) (HCC)    [facility]   Foot callus 07/01/2018   Foreign body in left foot    with  infection   Hypertension    Meniscus tear    Multiple rib fractures    Personal history of diabetic foot ulcer, Left Foot    Type 2 diabetes mellitus with stage 4 chronic kidney disease, without long-term current use of insulin  (HCC) 06/01/2012    SURGICAL HISTORY: Past Surgical History:  Procedure Laterality Date   A/V FISTULAGRAM Right 03/26/2021   Procedure: A/V FISTULAGRAM;  Surgeon: Sheree Penne Lonni, MD;  Location[facility]  [facility] INVASIVE CV LAB;  Service: Cardiovascular;  Laterality: Right;   A/V FISTULAGRAM N/A 04/19/2024   Procedure: A/V Fistulagram;  Surgeon: Lanis Fonda BRAVO, MD;  Location: [facility] PV LAB;  Service: Cardiovascular;  Laterality: N/A;   AV FISTULA PLACEMENT Left 11/06/2020   Procedure: LEFT ARM FIRST STAGE BRACHIAL BASILIC ARTERIOVENOUS (AV) FISTULA CREATION;  Surgeon: Gretta Lonni PARAS, MD;  Location: [facility] OR;  Service: Vascular;  Laterality: Left;   AV FISTULA PLACEMENT Right 12/05/2020   Procedure: RADIOCEPHALIC ARTERIOVENOUS (AV) FISTULA CREATION RIGHT ARM;  Surgeon: Eliza Lonni RAMAN, MD;  Location: [facility] OR;  Service: Vascular;  Laterality: Right;   EYE SURGERY Bilateral    lazer   I & D EXTREMITY  04/23/2012   Procedure: IRRIGATION AND DEBRIDEMENT EXTREMITY;  Surgeon: Jerona LULLA Sage, MD;  Location: [facility] OR;  Service: Orthopedics;  Laterality: Left;  Irrigation and Debridement Left Foot   I & D EXTREMITY  09/18/2012   Procedure: IRRIGATION AND DEBRIDEMENT EXTREMITY;  Surgeon: Jerona LULLA Sage, MD;  Location: [facility] OR;  Service: Orthopedics;  Laterality: Left;   IR PERC TUN PERIT CATH WO PORT S&I /IMAG  08/30/2020   IR REMOVAL TUN CV CATH W/O FL  04/17/2021   IR US  GUIDE VASC ACCESS RIGHT  08/30/2020   LIGATION OF ARTERIOVENOUS  FISTULA Left 12/05/2020   Procedure: LIGATION OF ARTERIOVENOUS  FISTULA LEFT ARM;  Surgeon: Eliza Lonni RAMAN, MD;  Location: [facility] OR;  Service: Vascular;  Laterality: Left;   MENISCUS REPAIR Left 04/2016   Orthopedist, Dr Lilly in [place], [place]    SOCIAL HISTORY: Social History   Socioeconomic History   Marital status: Married    Spouse name: Not on file   Number of children: Not on file   Years of education: Not on file   Highest education level: Associate degree: occupational, Scientist, product/process development, or vocational program  Occupational History   Not on file  Tobacco Use   Smoking status: Former    Current packs/day: 0.00    Types: Cigarettes    Quit date: 09/03/2012     Years since quitting: 11.6   Smokeless tobacco: Former    Quit date: 09/03/2012  Vaping Use   Vaping status: Never Used  Substance and Sexual Activity   Alcohol use: Not Currently   Drug use: Not Currently   Sexual activity: Yes  Other Topics Concern   Not on file  Social History Narrative   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    Social Drivers of Health   Financial Resource Strain: Medium Risk (01/01/2024)   Overall Financial Resource Strain (CARDIA)    Difficulty of Paying Living Expenses: Somewhat hard  Food Insecurity: No Food Insecurity (01/01/2024)   Hunger Vital Sign    Worried About Running Out of Food in the Last Year: Never true    Ran Out of Food in the Last Year: Never true  Transportation Needs: No Transportation Needs (01/01/2024)   PRAPARE - Administrator, Civil Service (Medical): No    Lack of Transportation (Non-Medical): No  Physical Activity: Insufficiently Active (01/01/2024)   Exercise Vital Sign    Days of Exercise per Week: 4 days    Minutes of Exercise per Session: 30 min  Stress: No Stress Concern Present (01/01/2024)   [employer] of Occupational Health - Occupational Stress Questionnaire    Feeling of Stress : Only a little  Social Connections: Moderately Integrated (01/01/2024)   Social Connection and Isolation Panel    Frequency of Communication with Friends and Family: Three times a week    Frequency of Social Gatherings with Friends and Family: Twice a week    Attends Religious Services: 1  to 4 times per year    Active Member of Clubs or Organizations: No    Attends Engineer, structural: Not on file    Marital Status: Married  Catering manager Violence: Not on file    FAMILY HISTORY: Family History  Problem Relation Age of Onset   Diabetes type II Mother    Arthritis Mother    Diabetes Mother    Hyperlipidemia Mother    Hypertension Mother    Diabetes Father    Diabetes type  II Sister    Colon cancer Neg Hx    Stomach cancer Neg Hx    Esophageal cancer Neg Hx    Pancreatic cancer Neg Hx     ALLERGIES:  is allergic to latex, ace inhibitors, and wound dressing adhesive.  MEDICATIONS:  Current Outpatient Medications  Medication Sig Dispense Refill   acetaminophen  (TYLENOL ) 325 MG tablet Take 650 mg by mouth every 6 (six) hours as needed for mild pain or headache.      amLODipine  (NORVASC ) 10 MG tablet Take 10 mg by mouth daily.     aspirin  EC 81 MG tablet Take 81 mg by mouth daily. Swallow whole.     azelastine  (ASTELIN ) 0.1 % nasal spray Place 1 spray into both nostrils 2 (two) times daily. Use in each nostril as directed 30 mL 1   benzonatate  (TESSALON ) 200 MG capsule Take 1 capsule (200 mg total) by mouth 3 (three) times daily as needed for cough. 20 capsule 0   calcium  acetate (PHOSLO) 667 MG capsule Take 2,001 mg by mouth 3 (three) times daily.     calcium  carbonate (TUMS - DOSED IN MG ELEMENTAL CALCIUM ) 500 MG chewable tablet Chew 1 tablet by mouth daily.     carvedilol  (COREG ) 25 MG tablet Take 25 mg by mouth 2 (two) times daily with a meal.     cloNIDine  (CATAPRES ) 0.1 MG tablet Take 0.1 mg by mouth 2 (two) times daily.     hydrALAZINE  (APRESOLINE ) 25 MG tablet Take 25 mg by mouth 3 (three) times daily.     irbesartan (AVAPRO) 300 MG tablet Take 300 mg by mouth daily.     lidocaine -prilocaine (EMLA) cream Apply 1 application. topically daily as needed (prior to port being accessed for dialysis).     nirmatrelvir /ritonavir , renal dosing, (PAXLOVID ) 10 x 150 MG & 10 x 100MG  TABS DAY 1: Take 300 mg nirmatrelvir  (2 x 150 mg tablets) with 100 mg ritonavir  (1 x 100 mg tablet). DAY 2-5: Take 150 mg nirmatrelvir  (1 x 150 mg tablets) with 100 mg ritonavir  (1 x 100 mg tablet). 11 tablet 0   rosuvastatin  (CRESTOR ) 20 MG tablet Take 20 mg by mouth daily.     No current facility-administered medications for this visit.    REVIEW OF SYSTEMS:   All relevant systems  were reviewed with the patient and are negative.  PHYSICAL EXAMINATION: ECOG PERFORMANCE STATUS: {CHL ONC ECOG PS:[id number]}  There were no vitals filed for this visit. There were no vitals filed for this visit.  GENERAL: alert, no distress and comfortable SKIN: skin color normal and no bruising or petechiae on exposed skin EYES: normal, sclera clear OROPHARYNX: no exudate  NECK: No palpable mass LYMPH:  no palpable cervical, axillary or inguinal lymphadenopathy  LUNGS: clear to auscultation and percussion with normal breathing effort HEART: regular rate & rhythm and no murmurs  ABDOMEN: abdomen soft, non-tender and nondistended. Musculoskeletal: no edema NEURO: no focal motor/sensory deficits  LABORATORY DATA:  I  have reviewed the data as listed  RADIOGRAPHIC STUDIES: I have personally reviewed the radiological images as listed and agreed with the findings in the report. PERIPHERAL VASCULAR CATHETERIZATION Result Date: 04/19/2024 Images from the original result were not included. Patient name: Karry Causer MRN: [id number] DOB: [birth year] Sex: Salinas 04/19/2024 Pre-operative Diagnosis: Left arm fistula malfunction Post-operative diagnosis:  Same Surgeon:  Fonda FORBES Rim, MD Procedure Performed: 1.  Ultrasound-guided micropuncture access of the right arm radiocephalic fistula 2.  Fistulogram, central venogram 3.  Contrast volume 20 mL Indications: Patient is a 58 year old Salinas with history of right arm radiocephalic fistula creation several years ago.  Recently had an infiltration episode with low flows.  He was asked to present to our office for fistulogram by his nephrologist.  After discussing the risks and benefits of right arm fistulogram in effort to define and, possibly improve flow through the fistula, Adriano elected to proceed. Findings: Widely patent radiocephalic fistula with filling of the superficial and deep systems at the antecubital fossa. Widely patent arterial  anastomosis. No flow-limiting stenosis centrally in the subclavian vein, innominate vein.  Procedure:  The patient was identified in the holding area and taken to room 8.  The patient was then placed supine on the table and prepped and draped in the usual sterile fashion.  A time out was called.  Ultrasound was used to evaluate the right arm brachiocephalic fistula.  This was accessed using a micropuncture needle, which was then exchanged for a micropuncture sheath.  Fistulogram followed.  See results above. Impression: Widely patent right arm radiocephalic fistula. No intervention performed. Fonda FORBES Rim MD [facility] Office: [phone]

## 2024-05-06 ENCOUNTER — Inpatient Hospital Stay

## 2024-05-18 ENCOUNTER — Telehealth: Payer: Self-pay

## 2024-05-18 NOTE — Telephone Encounter (Signed)
 Wife is calling and asking for a referral to Cardiology.  Dr Lonni Nanas is who he last saw but it has been over a year.  Please advise, Margit

## 2024-05-19 ENCOUNTER — Other Ambulatory Visit: Payer: Self-pay | Admitting: Family Medicine

## 2024-05-19 DIAGNOSIS — R9439 Abnormal result of other cardiovascular function study: Secondary | ICD-10-CM

## 2024-05-19 DIAGNOSIS — I499 Cardiac arrhythmia, unspecified: Secondary | ICD-10-CM

## 2024-05-19 NOTE — Progress Notes (Signed)
 Replaced cardiology referral as it has been over a year since last visit. Previously followed with Dr Lonni Nanas with [facility].

## 2024-06-03 ENCOUNTER — Telehealth: Payer: Self-pay | Admitting: Oncology

## 2024-06-03 ENCOUNTER — Encounter: Payer: Self-pay | Admitting: Oncology

## 2024-06-03 ENCOUNTER — Inpatient Hospital Stay

## 2024-06-03 ENCOUNTER — Inpatient Hospital Stay: Attending: Oncology | Admitting: Oncology

## 2024-06-03 ENCOUNTER — Other Ambulatory Visit: Payer: Self-pay

## 2024-06-03 VITALS — BP 145/67 | HR 52 | Temp 97.8°F | Resp 13 | Wt 204.4 lb

## 2024-06-03 DIAGNOSIS — Z87891 Personal history of nicotine dependence: Secondary | ICD-10-CM | POA: Diagnosis not present

## 2024-06-03 DIAGNOSIS — Z8616 Personal history of COVID-19: Secondary | ICD-10-CM | POA: Insufficient documentation

## 2024-06-03 DIAGNOSIS — D696 Thrombocytopenia, unspecified: Secondary | ICD-10-CM | POA: Diagnosis present

## 2024-06-03 DIAGNOSIS — N186 End stage renal disease: Secondary | ICD-10-CM | POA: Insufficient documentation

## 2024-06-03 DIAGNOSIS — E113599 Type 2 diabetes mellitus with proliferative diabetic retinopathy without macular edema, unspecified eye: Secondary | ICD-10-CM | POA: Diagnosis not present

## 2024-06-03 DIAGNOSIS — Z79899 Other long term (current) drug therapy: Secondary | ICD-10-CM | POA: Insufficient documentation

## 2024-06-03 DIAGNOSIS — Z992 Dependence on renal dialysis: Secondary | ICD-10-CM | POA: Diagnosis not present

## 2024-06-03 DIAGNOSIS — D638 Anemia in other chronic diseases classified elsewhere: Secondary | ICD-10-CM | POA: Insufficient documentation

## 2024-06-03 DIAGNOSIS — I132 Hypertensive heart and chronic kidney disease with heart failure and with stage 5 chronic kidney disease, or end stage renal disease: Secondary | ICD-10-CM | POA: Diagnosis not present

## 2024-06-03 DIAGNOSIS — Z79620 Long term (current) use of immunosuppressive biologic: Secondary | ICD-10-CM | POA: Insufficient documentation

## 2024-06-03 DIAGNOSIS — I739 Peripheral vascular disease, unspecified: Secondary | ICD-10-CM | POA: Insufficient documentation

## 2024-06-03 DIAGNOSIS — E1122 Type 2 diabetes mellitus with diabetic chronic kidney disease: Secondary | ICD-10-CM | POA: Diagnosis not present

## 2024-06-03 DIAGNOSIS — I509 Heart failure, unspecified: Secondary | ICD-10-CM | POA: Insufficient documentation

## 2024-06-03 DIAGNOSIS — D6489 Other specified anemias: Secondary | ICD-10-CM

## 2024-06-03 LAB — CBC WITH DIFFERENTIAL (CANCER CENTER ONLY)
Abs Immature Granulocytes: 0.01 K/uL (ref 0.00–0.07)
Basophils Absolute: 0.1 K/uL (ref 0.0–0.1)
Basophils Relative: 2 %
Eosinophils Absolute: 0.7 K/uL — ABNORMAL HIGH (ref 0.0–0.5)
Eosinophils Relative: 15 %
HCT: 29.6 % — ABNORMAL LOW (ref 39.0–52.0)
Hemoglobin: 10.4 g/dL — ABNORMAL LOW (ref 13.0–17.0)
Immature Granulocytes: 0 %
Lymphocytes Relative: 17 %
Lymphs Abs: 0.8 K/uL (ref 0.7–4.0)
MCH: 33 pg (ref 26.0–34.0)
MCHC: 35.1 g/dL (ref 30.0–36.0)
MCV: 94 fL (ref 80.0–100.0)
Monocytes Absolute: 0.6 K/uL (ref 0.1–1.0)
Monocytes Relative: 12 %
Neutro Abs: 2.5 K/uL (ref 1.7–7.7)
Neutrophils Relative %: 54 %
Platelet Count: 66 K/uL — ABNORMAL LOW (ref 150–400)
RBC: 3.15 MIL/uL — ABNORMAL LOW (ref 4.22–5.81)
RDW: 14.1 % (ref 11.5–15.5)
WBC Count: 4.6 K/uL (ref 4.0–10.5)
nRBC: 0 % (ref 0.0–0.2)

## 2024-06-03 LAB — VITAMIN B12: Vitamin B-12: 641 pg/mL (ref 180–914)

## 2024-06-03 LAB — IRON AND IRON BINDING CAPACITY (CC-WL,HP ONLY)
Iron: 132 ug/dL (ref 45–182)
Saturation Ratios: 48 % — ABNORMAL HIGH (ref 17.9–39.5)
TIBC: 277 ug/dL (ref 250–450)
UIBC: 145 ug/dL (ref 117–376)

## 2024-06-03 LAB — CMP (CANCER CENTER ONLY)
ALT: 15 U/L (ref 0–44)
AST: 19 U/L (ref 15–41)
Albumin: 4.1 g/dL (ref 3.5–5.0)
Alkaline Phosphatase: 158 U/L — ABNORMAL HIGH (ref 38–126)
Anion gap: 11 (ref 5–15)
BUN: 47 mg/dL — ABNORMAL HIGH (ref 6–20)
CO2: 27 mmol/L (ref 22–32)
Calcium: 8.6 mg/dL — ABNORMAL LOW (ref 8.9–10.3)
Chloride: 103 mmol/L (ref 98–111)
Creatinine: 7.51 mg/dL (ref 0.61–1.24)
GFR, Estimated: 8 mL/min — ABNORMAL LOW (ref >60)
Glucose, Bld: 95 mg/dL (ref 70–99)
Potassium: 3.9 mmol/L (ref 3.5–5.1)
Sodium: 141 mmol/L (ref 135–145)
Total Bilirubin: 1 mg/dL (ref 0.0–1.2)
Total Protein: 7.1 g/dL (ref 6.5–8.1)

## 2024-06-03 LAB — FERRITIN: Ferritin: 518 ng/mL — ABNORMAL HIGH (ref 24–336)

## 2024-06-03 LAB — TSH: TSH: 2.99 u[IU]/mL (ref 0.350–4.500)

## 2024-06-03 LAB — IMMATURE PLATELET FRACTION: Immature Platelet Fraction: 11.6 % — ABNORMAL HIGH (ref 1.2–8.6)

## 2024-06-03 LAB — LACTATE DEHYDROGENASE: LDH: 204 U/L — ABNORMAL HIGH (ref 98–192)

## 2024-06-03 LAB — FOLATE: Folate: 6.5 ng/mL (ref >5.9)

## 2024-06-03 NOTE — Assessment & Plan Note (Signed)
 Chronic low platelet count with levels around 102,000 in May, with previous counts below normal.   No significant bleeding, bruising, or clotting issues.   Labs today showed platelet count of 66,000.  White count 4600 with normal differential.  Hemoglobin stable at 10.4.  Immature platelet fraction is increased at 11.6%, indicating adequate bone marrow response to thrombocytopenia.  Iron studies show no evidence of iron deficiency.  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  Will follow-up on remaining workup including B12, folic acid, ANA.  Hepatitis and HIV testing were previously negative.  I will call him next week with results of remaining workup.  Will tentatively schedule a follow-up appointment in 3 months but we will move it to 6 weeks given declining platelet count.

## 2024-06-03 NOTE — Progress Notes (Signed)
 CRITICAL VALUE STICKER  CRITICAL VALUE:  Creatinine: 7.51  RECEIVER (on-site recipient of call):  Keene Crown, RN  DATE & TIME NOTIFIED:   06/03/24   0314  MESSENGER (representative from lab):  Hadassah  MD NOTIFIED: Dr. Autumn, Yes  TIME OF NOTIFICATION: 1313  RESPONSE: Aware  Patient is a dialysis patient

## 2024-06-03 NOTE — Progress Notes (Signed)
 [facility]  HEMATOLOGY CLINIC CONSULTATION NOTE   PATIENT NAME: [name]   MR#: [id number] DOB: Jan 13, 1966  DATE OF SERVICE: 06/03/2024  REFERRING PHYSICIAN  Saralee Stank, MD  Patient Care Team: Diona Perkins, MD as PCP - General Kate Lonni CROME, MD as PCP - Cardiology (Cardiology) Dialysis, [facility] Stank Saralee, MD as Consulting Physician (Nephrology)   REASON FOR CONSULTATION/ CHIEF COMPLAINT:  Evaluation of thrombocytopenia  ASSESSMENT & PLAN:  [name] is a 58 y.o. gentleman with past medical history of DM2, HTN, ESRD on hemodialysis, heart failure with preserved ejection fraction and grade 2 diastolic dysfunction, proliferative diabetic retinopathy requiring eye injections, peripheral vascular disease, aortic atherosclerosis and diffuse coronary atherosclerotic disease referred to our service for thrombocytopenia.  Thrombocytopenia (HCC) Chronic low platelet count with levels around 102,000 in May, with previous counts below normal.   No significant bleeding, bruising, or clotting issues.   Labs today showed platelet count of 66,000.  White count 4600 with normal differential.  Hemoglobin stable at 10.4.  Immature platelet fraction is increased at 11.6%, indicating adequate bone marrow response to thrombocytopenia.  Iron studies show no evidence of iron deficiency.  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  Will follow-up on remaining workup including B12, folic acid, ANA.  Hepatitis and HIV testing were previously negative.  I will call him next week with results of remaining workup.  Will tentatively schedule a follow-up appointment in 3 months but we will move it to 6 weeks given declining platelet count.  Anemia in other chronic diseases classified elsewhere Low hemoglobin levels consistent with end stage renal disease on dialysis. No new symptoms or changes in condition reported. Anemia  expected in dialysis patients.     I reviewed lab results and outside records for this visit and discussed relevant results with the patient. Diagnosis, plan of care and treatment options were also discussed in detail with the patient. Opportunity provided to ask questions and answers provided to his apparent satisfaction. Provided instructions to call our clinic with any problems, questions or concerns prior to return visit. I recommended to continue follow-up with PCP and sub-specialists. He verbalized understanding and agreed with the plan. No barriers to learning was detected.  Chinita Patten, MD 06/03/2024 2:00 PM [facility] - A DEPT OF [facility] [address] [place] [address] Dept: [phone] Dept Fax: [phone]  HISTORY OF PRESENTING ILLNESS:   Discussed the use of AI scribe software for clinical note transcription with the patient, who gave verbal consent to proceed.  History of Present Illness [name] is a 58 year old male who presents with low platelet count. He was referred by Dr. Stank Stank for evaluation of low platelet count.  Apparently labs at his nephrologist office in June 2025 showed thrombocytopenia and hence a referral was sent to us .  I was not able to find platelet count result in the labs that were sent.  On review of other available records, he was found to have a platelet count of 102,000 on 02/19/2024.  White count was 4500 at that time, hemoglobin 11.7.  Platelet count was mildly decreased at 131,000 in November 2021 and 138,000 in May 2015 and 132,000 in July 2013.  No bleeding, bruising, epistaxis, gum bleeds, or blood in stools, but he does bruise easily.  He had COVID-19 a month ago, which caused significant fatigue, but  he has since recovered and is feeling well.  He is on dialysis and receives heparin  during the procedure. He takes Tylenol  as needed, about once or  twice a week, and has recently started a new heart medication for blood pressure. He does not take ibuprofen, Aleve, or Advil.  There is no family history of platelet issues, and he has no history of blood clots. He stopped drinking alcohol four years ago and has a history of diabetes, which he managed with insulin . He attributes his kidney problems to his past heavy drinking and diabetes.  He denies recent bruising/bleeding, such as spontaneous epistaxis, hematuria, melena or hematochezia.  The patient denies history of liver disease, history of cardiac murmur/prior cardiovascular surgery or recent new medications.  He denies prior blood or platelet transfusions.  MEDICAL HISTORY Past Medical History:  Diagnosis Date   Allergy    Diabetes mellitus    Type 2 DM, Patient reports DM Type 1 diagnosd age at age 61   Diabetic foot ulcers (HCC)    ESRD (end stage renal disease) (HCC)    [facility]   Foot callus 07/01/2018   Foreign body in left foot    with  infection   Hypertension    Meniscus tear    Multiple rib fractures    Personal history of diabetic foot ulcer, Left Foot    Type 2 diabetes mellitus with stage 4 chronic kidney disease, without long-term current use of insulin  (HCC) 06/01/2012     SURGICAL HISTORY Past Surgical History:  Procedure Laterality Date   A/V FISTULAGRAM Right 03/26/2021   Procedure: A/V FISTULAGRAM;  Surgeon: Sheree Penne Bruckner, MD;  Location: [facility] INVASIVE CV LAB;  Service: Cardiovascular;  Laterality: Right;   A/V FISTULAGRAM N/A 04/19/2024   Procedure: A/V Fistulagram;  Surgeon: Lanis Fonda BRAVO, MD;  Location: [facility] LAB;  Service: Cardiovascular;  Laterality: N/A;   AV FISTULA PLACEMENT Left 11/06/2020   Procedure: LEFT ARM FIRST STAGE BRACHIAL BASILIC ARTERIOVENOUS (AV) FISTULA CREATION;  Surgeon: Gretta Bruckner PARAS, MD;  Location: [facility] OR;  Service: Vascular;  Laterality: Left;   AV FISTULA PLACEMENT Right 12/05/2020   Procedure:  RADIOCEPHALIC ARTERIOVENOUS (AV) FISTULA CREATION RIGHT ARM;  Surgeon: Eliza Bruckner RAMAN, MD;  Location: [facility] OR;  Service: Vascular;  Laterality: Right;   EYE SURGERY Bilateral    lazer   I & D EXTREMITY  04/23/2012   Procedure: IRRIGATION AND DEBRIDEMENT EXTREMITY;  Surgeon: Jerona LULLA Sage, MD;  Location: [facility] OR;  Service: Orthopedics;  Laterality: Left;  Irrigation and Debridement Left Foot   I & D EXTREMITY  09/18/2012   Procedure: IRRIGATION AND DEBRIDEMENT EXTREMITY;  Surgeon: Jerona LULLA Sage, MD;  Location: [facility] OR;  Service: Orthopedics;  Laterality: Left;   IR PERC TUN PERIT CATH WO PORT S&I /IMAG  08/30/2020   IR REMOVAL TUN CV CATH W/O FL  04/17/2021   IR US  GUIDE VASC ACCESS RIGHT  08/30/2020   LIGATION OF ARTERIOVENOUS  FISTULA Left 12/05/2020   Procedure: LIGATION OF ARTERIOVENOUS  FISTULA LEFT ARM;  Surgeon: Eliza Bruckner RAMAN, MD;  Location: [facility] OR;  Service: Vascular;  Laterality: Left;   MENISCUS REPAIR Left 04/2016   Orthopedist, Dr Lilly in [place], [place]     SOCIAL HISTORY: He reports that he quit smoking about 11 years ago. His smoking use included cigarettes. He quit smokeless tobacco use about 11 years ago. He reports that he does not currently use alcohol. He reports that he does not currently  use drugs. Social History   Socioeconomic History   Marital status: Married    Spouse name: Not on file   Number of children: Not on file   Years of education: Not on file   Highest education level: Associate degree: occupational, Scientist, product/process development, or vocational program  Occupational History   Not on file  Tobacco Use   Smoking status: Former    Current packs/day: 0.00    Types: Cigarettes    Quit date: 09/03/2012    Years since quitting: 11.7   Smokeless tobacco: Former    Quit date: 09/03/2012  Vaping Use   Vaping status: Never Used  Substance and Sexual Activity   Alcohol use: Not Currently   Drug use: Not Currently   Sexual activity: Yes  Other Topics Concern   Not  on file  Social History Narrative   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    Social Drivers of Health   Financial Resource Strain: Medium Risk (01/01/2024)   Overall Financial Resource Strain (CARDIA)    Difficulty of Paying Living Expenses: Somewhat hard  Food Insecurity: No Food Insecurity (06/03/2024)   Hunger Vital Sign    Worried About Running Out of Food in the Last Year: Never true    Ran Out of Food in the Last Year: Never true  Transportation Needs: No Transportation Needs (06/03/2024)   PRAPARE - Administrator, Civil Service (Medical): No    Lack of Transportation (Non-Medical): No  Physical Activity: Insufficiently Active (01/01/2024)   Exercise Vital Sign    Days of Exercise per Week: 4 days    Minutes of Exercise per Session: 30 min  Stress: No Stress Concern Present (01/01/2024)   [employer] of Occupational Health - Occupational Stress Questionnaire    Feeling of Stress : Only a little  Social Connections: Moderately Integrated (01/01/2024)   Social Connection and Isolation Panel    Frequency of Communication with Friends and Family: Three times a week    Frequency of Social Gatherings with Friends and Family: Twice a week    Attends Religious Services: 1 to 4 times per year    Active Member of [employer] or Organizations: No    Attends Engineer, structural: Not on file    Marital Status: Married  Catering manager Violence: Not At Risk (06/03/2024)   Humiliation, Afraid, Rape, and Kick questionnaire    Fear of Current or Ex-Partner: No    Emotionally Abused: No    Physically Abused: No    Sexually Abused: No    FAMILY HISTORY: His family history includes Arthritis in his mother; Diabetes in his father and mother; Diabetes type II in his mother and sister; Hyperlipidemia in his mother; Hypertension in his mother.  CURRENT MEDICATIONS   Current Outpatient Medications  Medication Instructions   acetaminophen   (TYLENOL ) 650 mg, Every 6 hours PRN   amLODipine  (NORVASC ) 10 mg, Daily   aspirin  EC 81 mg, Daily   calcium  acetate (PHOSLO) 2,001 mg, 3 times daily   calcium  carbonate (TUMS - DOSED IN MG ELEMENTAL CALCIUM ) 500 MG chewable tablet 1 tablet, Daily   carvedilol  (COREG ) 25 mg, 2 times daily with meals   doxazosin (CARDURA) 2 mg, Daily   hydrALAZINE  (APRESOLINE ) 25 mg, 3 times daily   irbesartan (AVAPRO) 300 mg, Daily   lidocaine -prilocaine (EMLA) cream 1 application , Daily PRN   rosuvastatin  (CRESTOR ) 20 mg, Daily   torsemide (DEMADEX) 20 mg, Daily  ALLERGIES  He is allergic to latex, ace inhibitors, and wound dressing adhesive.  REVIEW OF SYSTEMS:  Review of Systems - Oncology   Rest of the pertinent review of systems is unremarkable except as mentioned above in HPI.  PHYSICAL EXAMINATION:    Onc Performance Status - 06/03/24 1148       ECOG Perf Status   ECOG Perf Status Fully active, able to carry on all pre-disease performance without restriction      KPS SCALE   KPS % SCORE Normal, no compliants, no evidence of disease          Vitals:   06/03/24 1134  BP: (!) 145/67  Pulse: (!) 52  Resp: 13  Temp: 97.8 F (36.6 C)  SpO2: 95%   Filed Weights   06/03/24 1134  Weight: 204 lb 6.4 oz (92.7 kg)    Physical Exam Constitutional:      General: He is not in acute distress.    Appearance: Normal appearance.  HENT:     Head: Normocephalic and atraumatic.  Eyes:     Conjunctiva/sclera: Conjunctivae normal.  Cardiovascular:     Rate and Rhythm: Normal rate and regular rhythm.  Pulmonary:     Effort: Pulmonary effort is normal. No respiratory distress.  Abdominal:     General: There is no distension.  Neurological:     General: No focal deficit present.     Mental Status: He is alert and oriented to person, place, and time.  Psychiatric:        Mood and Affect: Mood normal.        Behavior: Behavior normal.     LABORATORY DATA:   I have reviewed  the data as listed.  Results for orders placed or performed in visit on 06/03/24  Lactate dehydrogenase  Result Value Ref Range   LDH 204 (H) 98 - 192 U/L  Iron and Iron Binding Capacity ([facility] only)  Result Value Ref Range   Iron 132 45 - 182 ug/dL   TIBC 722 749 - 549 ug/dL   Saturation Ratios 48 (H) 17.9 - 39.5 %   UIBC 145 117 - 376 ug/dL  Immature Platelet Fraction  Result Value Ref Range   Immature Platelet Fraction 11.6 (H) 1.2 - 8.6 %  CMP (Cancer Center only)  Result Value Ref Range   Sodium 141 135 - 145 mmol/L   Potassium 3.9 3.5 - 5.1 mmol/L   Chloride 103 98 - 111 mmol/L   CO2 27 22 - 32 mmol/L   Glucose, Bld 95 70 - 99 mg/dL   BUN 47 (H) 6 - 20 mg/dL   Creatinine 2.48 (HH) 0.61 - 1.24 mg/dL   Calcium  8.6 (L) 8.9 - 10.3 mg/dL   Total Protein 7.1 6.5 - 8.1 g/dL   Albumin 4.1 3.5 - 5.0 g/dL   AST 19 15 - 41 U/L   ALT 15 0 - 44 U/L   Alkaline Phosphatase 158 (H) 38 - 126 U/L   Total Bilirubin 1.0 0.0 - 1.2 mg/dL   GFR, Estimated 8 (L) >60 mL/min   Anion gap 11 5 - 15  CBC with Differential (Cancer Center Only)  Result Value Ref Range   WBC Count 4.6 4.0 - 10.5 K/uL   RBC 3.15 (L) 4.22 - 5.81 MIL/uL   Hemoglobin 10.4 (L) 13.0 - 17.0 g/dL   HCT 70.3 (L) 60.9 - 47.9 %   MCV 94.0 80.0 - 100.0 fL   MCH 33.0 26.0 - 34.0 pg  MCHC 35.1 30.0 - 36.0 g/dL   RDW 85.8 88.4 - 84.4 %   Platelet Count 66 (L) 150 - 400 K/uL   nRBC 0.0 0.0 - 0.2 %   Neutrophils Relative % 54 %   Neutro Abs 2.5 1.7 - 7.7 K/uL   Lymphocytes Relative 17 %   Lymphs Abs 0.8 0.7 - 4.0 K/uL   Monocytes Relative 12 %   Monocytes Absolute 0.6 0.1 - 1.0 K/uL   Eosinophils Relative 15 %   Eosinophils Absolute 0.7 (H) 0.0 - 0.5 K/uL   Basophils Relative 2 %   Basophils Absolute 0.1 0.0 - 0.1 K/uL   WBC Morphology MORPHOLOGY UNREMARKABLE    RBC Morphology MORPHOLOGY UNREMARKABLE    Smear Review See Note    Immature Granulocytes 0 %   Abs Immature Granulocytes 0.01 0.00 - 0.07 K/uL      RADIOGRAPHIC STUDIES:  No pertinent imaging studies available to review.  Orders Placed This Encounter  Procedures   CBC with Differential (Cancer Center Only)    Standing Status:   Future    Number of Occurrences:   1    Expiration Date:   06/03/2025   CMP (Cancer Center only)    Standing Status:   Future    Number of Occurrences:   1    Expiration Date:   06/03/2025   Immature Platelet Fraction    Standing Status:   Future    Number of Occurrences:   1    Expiration Date:   06/03/2025   Vitamin B12    Standing Status:   Future    Number of Occurrences:   1    Expiration Date:   06/03/2025   Folate    Standing Status:   Future    Number of Occurrences:   1    Expiration Date:   06/03/2025   ANA w/Reflex if Positive    Standing Status:   Future    Number of Occurrences:   1    Expiration Date:   06/03/2025   Iron and Iron Binding Capacity ([facility] only)    Standing Status:   Future    Number of Occurrences:   1    Expiration Date:   06/03/2025   Ferritin    Standing Status:   Future    Number of Occurrences:   1    Expiration Date:   06/03/2025   Lactate dehydrogenase    Standing Status:   Future    Number of Occurrences:   1    Expiration Date:   06/03/2025   TSH    Standing Status:   Future    Number of Occurrences:   1    Expiration Date:   06/03/2025    Future Appointments  Date Time Provider Department Center  06/10/2024 11:45 AM Weslie Rasmus, Chinita, MD [facility] None  08/27/2024 11:15 AM [facility] LAB [facility] None  08/27/2024 11:45 AM Dorothie Wah, MD [facility] None     I spent a total of 55 minutes during this encounter with the patient including review of chart and various tests results, discussions about plan of care and coordination of care plan.  This document was completed utilizing speech recognition software. Grammatical errors, random word insertions, pronoun errors, and incomplete sentences are an occasional consequence of this system due  to software limitations, ambient noise, and hardware issues. Any formal questions or concerns about the content, text or information contained within the body of this dictation should be directly addressed to  the provider for clarification.

## 2024-06-03 NOTE — Assessment & Plan Note (Signed)
 Low hemoglobin levels consistent with end stage renal disease on dialysis. No new symptoms or changes in condition reported. Anemia expected in dialysis patients.

## 2024-06-04 LAB — ANA W/REFLEX IF POSITIVE: Anti Nuclear Antibody (ANA): NEGATIVE

## 2024-06-10 ENCOUNTER — Inpatient Hospital Stay: Attending: Oncology | Admitting: Oncology

## 2024-06-10 ENCOUNTER — Telehealth: Payer: Self-pay | Admitting: Oncology

## 2024-06-10 ENCOUNTER — Encounter: Payer: Self-pay | Admitting: Oncology

## 2024-06-10 DIAGNOSIS — N186 End stage renal disease: Secondary | ICD-10-CM | POA: Insufficient documentation

## 2024-06-10 DIAGNOSIS — D696 Thrombocytopenia, unspecified: Secondary | ICD-10-CM | POA: Insufficient documentation

## 2024-06-10 DIAGNOSIS — D631 Anemia in chronic kidney disease: Secondary | ICD-10-CM | POA: Insufficient documentation

## 2024-06-10 DIAGNOSIS — I5030 Unspecified diastolic (congestive) heart failure: Secondary | ICD-10-CM

## 2024-06-10 DIAGNOSIS — Z794 Long term (current) use of insulin: Secondary | ICD-10-CM | POA: Insufficient documentation

## 2024-06-10 DIAGNOSIS — Z992 Dependence on renal dialysis: Secondary | ICD-10-CM | POA: Insufficient documentation

## 2024-06-10 DIAGNOSIS — I132 Hypertensive heart and chronic kidney disease with heart failure and with stage 5 chronic kidney disease, or end stage renal disease: Secondary | ICD-10-CM | POA: Insufficient documentation

## 2024-06-10 DIAGNOSIS — Z79899 Other long term (current) drug therapy: Secondary | ICD-10-CM | POA: Insufficient documentation

## 2024-06-10 DIAGNOSIS — D638 Anemia in other chronic diseases classified elsewhere: Secondary | ICD-10-CM | POA: Insufficient documentation

## 2024-06-10 DIAGNOSIS — I739 Peripheral vascular disease, unspecified: Secondary | ICD-10-CM | POA: Insufficient documentation

## 2024-06-10 DIAGNOSIS — E113599 Type 2 diabetes mellitus with proliferative diabetic retinopathy without macular edema, unspecified eye: Secondary | ICD-10-CM | POA: Insufficient documentation

## 2024-06-10 DIAGNOSIS — E1122 Type 2 diabetes mellitus with diabetic chronic kidney disease: Secondary | ICD-10-CM | POA: Insufficient documentation

## 2024-06-10 NOTE — Progress Notes (Signed)
 [facility]  HEMATOLOGY-ONCOLOGY ELECTRONIC VISIT PROGRESS NOTE  PATIENT NAME: [name]   MR#: [id number] DOB: 10-25-1965  DATE OF SERVICE: 06/10/2024  Patient Care Team: Diona Perkins, MD as PCP - General Kate Lonni CROME, MD as PCP - Cardiology (Cardiology) Dialysis, [facility] Dennise Capri, MD as Consulting Physician (Nephrology)  I connected with the patient via telephone conference and verified that I am speaking with the correct person using two identifiers. The patient's location is at home and I am providing care from the [facility].  I discussed the limitations, risks, security and privacy concerns of performing an evaluation and management service by e-visits and the availability of in person appointments. I also discussed with the patient that there may be a patient responsible charge related to this service. The patient expressed understanding and agreed to proceed.   ASSESSMENT & PLAN:   [name] is a 58 y.o. gentleman with past medical history of DM2, HTN, ESRD on hemodialysis, heart failure with preserved ejection fraction and grade 2 diastolic dysfunction, proliferative diabetic retinopathy requiring eye injections, peripheral vascular disease, aortic atherosclerosis and diffuse coronary atherosclerotic disease was referred to our service in August 2025 for thrombocytopenia.  Likely ITP.  Thrombocytopenia (HCC) Chronic low platelet count with levels around 102,000 in May, with previous counts below normal.   No significant bleeding, bruising, or clotting issues.   On his consultation with us  on 06/03/2024, labs showed platelet count of 66,000. White count 4600 with normal differential. Hemoglobin stable at 10.4. Immature platelet fraction is increased at 11.6%, indicating adequate bone marrow response to thrombocytopenia. Iron studies showed no evidence of iron deficiency.  TSH, B12, folic acid  were also within normal  limits.  ANA negative.  Hepatitis and HIV testing were previously negative.  Clinical picture consistent with ITP.  Plan to monitor closely and begin treatments with steroids, if platelet count were to trend below 30,000.  - Schedule follow-up appointment for next Thursday to review lab results and determine if treatment is necessary.    I discussed the assessment and treatment plan with the patient. The patient was provided an opportunity to ask questions and all were answered. The patient agreed with the plan and demonstrated an understanding of the instructions. The patient was advised to call back or seek an in-person evaluation if the symptoms worsen or if the condition fails to improve as anticipated.    I spent 12 minutes over the phone with the patient reviewing test results, discuss management and coordination/planning of care.  Chinita Patten, MD 06/10/2024 12:22 PM [facility] - A DEPT OF [facility] [address] [place] [address] Dept: [phone] Dept Fax: [phone]   INTERVAL HISTORY:  Please see above for problem oriented charting.  The purpose of today's discussion is to explain recent lab results and to formulate plan of care.  Discussed the use of AI scribe software for clinical note transcription with the patient, who gave verbal consent to proceed.  History of Present Illness [name] is a 58 year old male with chronic kidney disease on dialysis who presents with thrombocytopenia. He is accompanied by his wife.  He is experiencing thrombocytopenia, with a recent platelet count of 66,000, down from 102,000. He is currently on dialysis for chronic kidney disease, and his kidney doctor has indicated that some blood work abnormalities are typical for dialysis patients, though the platelet count is  unusually low.  A comprehensive workup was conducted, including checks of vitamin B12,  folic acid , iron levels, and thyroid  function, all of which returned normal results. His hemoglobin was also slightly low.   SUMMARY OF HEMATOLOGY HISTORY:  He was referred by Dr. Dennise for evaluation of low platelet count.   Apparently labs at his nephrologist office in June 2025 showed thrombocytopenia and hence a referral was sent to us .  I was not able to find platelet count result in the labs that were sent.   On review of other available records, he was found to have a platelet count of 102,000 on 02/19/2024.  White count was 4500 at that time, hemoglobin 11.7.  Platelet count was mildly decreased at 131,000 in November 2021 and 138,000 in May 2015 and 132,000 in July 2013.   No bleeding, bruising, epistaxis, gum bleeds, or blood in stools, but he does bruise easily.   He had COVID-19 in July 2025, which caused significant fatigue, but he has since recovered and is feeling well.   He is on dialysis and receives heparin  during the procedure. He takes Tylenol  as needed, about once or twice a week, and has recently started a new heart medication for blood pressure. He does not take ibuprofen, Aleve, or Advil.   There is no family history of platelet issues, and he has no history of blood clots. He stopped drinking alcohol four years ago and has a history of diabetes, which he managed with insulin . He attributes his kidney problems to his past heavy drinking and diabetes.   He denies recent bruising/bleeding, such as spontaneous epistaxis, hematuria, melena or hematochezia.   The patient denies history of liver disease, history of cardiac murmur/prior cardiovascular surgery or recent new medications.   He denies prior blood or platelet transfusions.  On his consultation with us  on 06/03/2024, labs showed platelet count of 66,000. White count 4600 with normal differential. Hemoglobin stable at 10.4. Immature platelet fraction is increased at 11.6%, indicating adequate bone marrow response to  thrombocytopenia. Iron studies showed no evidence of iron deficiency.  TSH, B12, folic acid  were also within normal limits.  ANA negative.  Hepatitis and HIV testing were previously negative.  Clinical picture consistent with ITP.  Plan to monitor closely and begin treatments with steroids, if platelet count were to trend below 30,000.  REVIEW OF SYSTEMS:    Review of Systems - Oncology  All other pertinent systems were reviewed with the patient and are negative.  I have reviewed the past medical history, past surgical history, social history and family history with the patient and they are unchanged from previous note.  ALLERGIES:  He is allergic to latex, ace inhibitors, and wound dressing adhesive.  MEDICATIONS:  Current Outpatient Medications  Medication Sig Dispense Refill   acetaminophen  (TYLENOL ) 325 MG tablet Take 650 mg by mouth every 6 (six) hours as needed for mild pain or headache.      amLODipine  (NORVASC ) 10 MG tablet Take 10 mg by mouth daily.     aspirin  EC 81 MG tablet Take 81 mg by mouth daily. Swallow whole.     calcium  acetate (PHOSLO) 667 MG capsule Take 2,001 mg by mouth 3 (three) times daily.     calcium  carbonate (TUMS - DOSED IN MG ELEMENTAL CALCIUM ) 500 MG chewable tablet Chew 1 tablet by mouth daily.     carvedilol  (COREG ) 25 MG tablet Take 25 mg by mouth 2 (two) times daily with a meal.  doxazosin (CARDURA) 2 MG tablet Take 2 mg by mouth daily.     hydrALAZINE  (APRESOLINE ) 25 MG tablet Take 25 mg by mouth 3 (three) times daily.     irbesartan (AVAPRO) 300 MG tablet Take 300 mg by mouth daily.     lidocaine -prilocaine (EMLA) cream Apply 1 application. topically daily as needed (prior to port being accessed for dialysis).     rosuvastatin  (CRESTOR ) 20 MG tablet Take 20 mg by mouth daily.     torsemide (DEMADEX) 20 MG tablet Take 20 mg by mouth daily.     No current facility-administered medications for this visit.    PHYSICAL EXAMINATION:    Onc  Performance Status - 06/10/24 1100       ECOG Perf Status   ECOG Perf Status Fully active, able to carry on all pre-disease performance without restriction      KPS SCALE   KPS % SCORE Normal, no compliants, no evidence of disease          LABORATORY DATA:   I have reviewed the data as listed.  Recent Results (from the past 2160 hours)  Glucose, capillary     Status: None   Collection Time: 04/19/24  7:36 AM  Result Value Ref Range   Glucose-Capillary 88 70 - 99 mg/dL    Comment: Glucose reference range applies only to samples taken after fasting for at least 8 hours.  POC SOFIA Antigen FIA     Status: Abnormal   Collection Time: 04/29/24  8:40 AM  Result Value Ref Range   SARS Coronavirus 2 Ag Positive (A) Negative  TSH     Status: None   Collection Time: 06/03/24 12:21 PM  Result Value Ref Range   TSH 2.990 0.350 - 4.500 uIU/mL    Comment: Performed at [facility], [address], [place], [place] [address]  Lactate dehydrogenase     Status: Abnormal   Collection Time: 06/03/24 12:22 PM  Result Value Ref Range   LDH 204 (H) 98 - 192 U/L    Comment: Performed at [facility], [address] [place], [place] [address]  Ferritin     Status: Abnormal   Collection Time: 06/03/24 12:22 PM  Result Value Ref Range   Ferritin 518 (H) 24 - 336 ng/mL    Comment: Performed at [facility], [address] [place], [place] [address]  Iron and Iron Binding Capacity ([facility] only)     Status: Abnormal   Collection Time: 06/03/24 12:22 PM  Result Value Ref Range   Iron 132 45 - 182 ug/dL   TIBC 722 749 - 549 ug/dL   Saturation Ratios 48 (H) 17.9 - 39.5 %   UIBC 145 117 - 376 ug/dL    Comment: Performed at [facility], [address] [place], [place] [address]  ANA w/Reflex if Positive     Status: None   Collection Time: 06/03/24 12:22 PM  Result Value Ref Range   Anti Nuclear Antibody  (ANA) Negative Negative    Comment: (NOTE) Performed At: [facility] [address] [place], [place] [id number] Jennette Shorter MD Ey:[id number]   Folate     Status: None   Collection Time: 06/03/24 12:22 PM  Result Value Ref Range   Folate 6.5 >5.9 ng/mL    Comment: Performed at [facility], [address], [place], [place] [address]  Vitamin B12     Status: None   Collection Time: 06/03/24  12:22 PM  Result Value Ref Range   Vitamin B-12 641 180 - 914 pg/mL    Comment: Performed at [facility], [address] [place], [place] [address]  Immature Platelet Fraction     Status: Abnormal   Collection Time: 06/03/24 12:22 PM  Result Value Ref Range   Immature Platelet Fraction 11.6 (H) 1.2 - 8.6 %    Comment:        An elevated IPF indicates increased platelet production. A low platelet count with an elevated IPF may be associated with peripheral platelet destruction (e.g. DIC, ITP) or bone marrow recovery (e.g. after chemotherapy or transplant). A low platelet count with a low or non- elevated IPF is consistent with a platelet production disorder. Performed at [facility], [address], [place], [place] [address]   CMP (Cancer Center only)     Status: Abnormal   Collection Time: 06/03/24 12:22 PM  Result Value Ref Range   Sodium 141 135 - 145 mmol/L   Potassium 3.9 3.5 - 5.1 mmol/L   Chloride 103 98 - 111 mmol/L   CO2 27 22 - 32 mmol/L   Glucose, Bld 95 70 - 99 mg/dL    Comment: Glucose reference range applies only to samples taken after fasting for at least 8 hours.   BUN 47 (H) 6 - 20 mg/dL   Creatinine 2.48 (HH) 0.61 - 1.24 mg/dL    Comment: REPEATED TO VERIFY CRITICAL RESULT CALLED TO, READ BACK BY AND VERIFIED WITH: Keene Debarah Heinrich, RN at [phone] by CHRISTELLA Rattler     Calcium  8.6 (L) 8.9 - 10.3 mg/dL   Total Protein 7.1 6.5 - 8.1 g/dL   Albumin 4.1 3.5 - 5.0 g/dL   AST 19 15 - 41 U/L   ALT 15  0 - 44 U/L   Alkaline Phosphatase 158 (H) 38 - 126 U/L   Total Bilirubin 1.0 0.0 - 1.2 mg/dL   GFR, Estimated 8 (L) >60 mL/min    Comment: (NOTE) Calculated using the CKD-EPI Creatinine Equation (2021)    Anion gap 11 5 - 15    Comment: Performed at [facility], [address], [place], [place] [address]  CBC with Differential (Cancer Center Only)     Status: Abnormal   Collection Time: 06/03/24 12:22 PM  Result Value Ref Range   WBC Count 4.6 4.0 - 10.5 K/uL   RBC 3.15 (L) 4.22 - 5.81 MIL/uL   Hemoglobin 10.4 (L) 13.0 - 17.0 g/dL   HCT 70.3 (L) 60.9 - 47.9 %   MCV 94.0 80.0 - 100.0 fL   MCH 33.0 26.0 - 34.0 pg   MCHC 35.1 30.0 - 36.0 g/dL   RDW 85.8 88.4 - 84.4 %   Platelet Count 66 (L) 150 - 400 K/uL    Comment: PLATELET COUNT CONFIRMED BY SMEAR   nRBC 0.0 0.0 - 0.2 %   Neutrophils Relative % 54 %   Neutro Abs 2.5 1.7 - 7.7 K/uL   Lymphocytes Relative 17 %   Lymphs Abs 0.8 0.7 - 4.0 K/uL   Monocytes Relative 12 %   Monocytes Absolute 0.6 0.1 - 1.0 K/uL   Eosinophils Relative 15 %   Eosinophils Absolute 0.7 (H) 0.0 - 0.5 K/uL   Basophils Relative 2 %   Basophils Absolute 0.1 0.0 - 0.1 K/uL   WBC Morphology MORPHOLOGY UNREMARKABLE    RBC Morphology MORPHOLOGY UNREMARKABLE    Smear Review See Note  Comment: PLATELETS APPEAR DECREASED   Immature Granulocytes 0 %   Abs Immature Granulocytes 0.01 0.00 - 0.07 K/uL    Comment: Performed at [facility], [address], [place], [place] [address]     RADIOGRAPHIC STUDIES:  No recent pertinent imaging studies available to review.  Orders Placed This Encounter  Procedures   CBC with Differential (Cancer Center Only)    Standing Status:   Future    Expected Date:   06/17/2024    Expiration Date:   06/10/2025     Future Appointments  Date Time Provider Department Center  06/11/2024  2:20 PM Diona Perkins, MD [facility]  06/17/2024  9:00 AM [facility]  None  06/17/2024  9:30 AM Liston Thum, Chinita, MD [facility] None  08/27/2024 11:15 AM [facility] LAB [facility] None  08/27/2024 11:45 AM Zia Najera, Chinita, MD [facility] None    This document was completed utilizing speech recognition software. Grammatical errors, random word insertions, pronoun errors, and incomplete sentences are an occasional consequence of this system due to software limitations, ambient noise, and hardware issues. Any formal questions or concerns about the content, text or information contained within the body of this dictation should be directly addressed to the provider for clarification.

## 2024-06-10 NOTE — Telephone Encounter (Signed)
 [name] has been made aware of his follow up appointment.

## 2024-06-10 NOTE — Assessment & Plan Note (Addendum)
 Chronic low platelet count with levels around 102,000 in May, with previous counts below normal.   No significant bleeding, bruising, or clotting issues.   On his consultation with us  on 06/03/2024, labs showed platelet count of 66,000. White count 4600 with normal differential. Hemoglobin stable at 10.4. Immature platelet fraction is increased at 11.6%, indicating adequate bone marrow response to thrombocytopenia. Iron studies showed no evidence of iron deficiency.  TSH, B12, folic acid  were also within normal limits.  ANA negative.  Hepatitis and HIV testing were previously negative.  Clinical picture consistent with ITP.  Plan to monitor closely and begin treatments with steroids, if platelet count were to trend below 30,000.  - Schedule follow-up appointment for next Thursday to review lab results and determine if treatment is necessary.

## 2024-06-11 ENCOUNTER — Ambulatory Visit (INDEPENDENT_AMBULATORY_CARE_PROVIDER_SITE_OTHER): Admitting: Family Medicine

## 2024-06-11 ENCOUNTER — Encounter: Payer: Self-pay | Admitting: Family Medicine

## 2024-06-11 VITALS — BP 177/61 | HR 55 | Ht 70.0 in | Wt 200.5 lb

## 2024-06-11 DIAGNOSIS — L089 Local infection of the skin and subcutaneous tissue, unspecified: Secondary | ICD-10-CM | POA: Diagnosis present

## 2024-06-11 MED ORDER — CEFADROXIL 500 MG PO CAPS: ORAL_CAPSULE | ORAL | 0 refills | Status: DC

## 2024-06-11 MED ORDER — CEFADROXIL 500 MG PO CAPS: 500.0000 mg | ORAL_CAPSULE | Freq: Two times a day (BID) | ORAL | 0 refills | Status: DC

## 2024-06-11 NOTE — Progress Notes (Signed)
    SUBJECTIVE:   CHIEF COMPLAINT / HPI:   Bump on R leg Over past two weeks, patient has noticed red, painful bump on R lower leg. No recent trauma or injury. No bug bites. No draining or bleeding. Has been walking normally. Legs often have very dry skin.  PERTINENT  PMH / PSH: ESRD on HD  OBJECTIVE:   BP (!) 177/61   Pulse (!) 55   Ht 5' 10 (1.778 m)   Wt 200 lb 8 oz (90.9 kg)   SpO2 98%   BMI 28.77 kg/m   General: No acute distress. Resting comfortably in room. CV: Warm and well-perfused. Pulm: Breathing comfortably on room air. CTAB. No increased WOB. Skin:  ~1 cm erythematous nodule of lateral R lower leg, tender to touch, increased warmth, no active drainage, no skin breakdown. DP pulses intact bilaterally.  Psych: Pleasant and appropriate.    ASSESSMENT/PLAN:   Assessment & Plan Skin infection Exam demonstrating inflammatory changes, concern for possible local skin infection. Patient afebrile and overall well-appearing. Last HD session this morning.  - Discussed 1000mg  doxycycline  first dose and then 500mg  following HD for his next 2 dialysis sessions - RTC if not improving     Damien Cassis, MD [facility]

## 2024-06-11 NOTE — Patient Instructions (Addendum)
 Thank you for visiting clinic today and allowing us  to participate in your care!  The bump on your leg might be infected. Please take the antibiotics as prescribed. Let us  know if your symptoms are not improving by next week.   Please schedule an appointment as needed for routine follow up.   Reach out any time with any questions or concerns you may have - we are here for you!  Damien Cassis, MD [facility] [phone]  To schedule an appointment with your Cardiologist, please call [facility]: [phone]

## 2024-06-11 NOTE — Progress Notes (Deleted)
 Elam Ivey Cina is {Pc accompanied by:5710} Sources of clinical information for visit is/are {Information source:60032}. Nursing assessment for this office visit was reviewed with the patient for accuracy and revision.   Previous Report(s) Reviewed: {Outside review:15817}     06/03/2024   11:48 AM  Depression screen PHQ 2/9  Decreased Interest 0  Down, Depressed, Hopeless 0  PHQ - 2 Score 0   Flowsheet Row Office Visit from 04/29/2024 in [facility] - A Dept Of [facility] Office Visit from 12/12/2022 in [facility] - A Dept Of [facility] Office Visit from 11/29/2022 in [facility] - A Dept Of [facility]  Thoughts that you would be better off dead, or of hurting yourself in some way Not at all Not at all Not at all  PHQ-9 Total Score 2 1 0       03/01/2020    9:51 AM 02/22/2020    2:36 PM 11/17/2014   10:49 AM  Fall Risk   Falls in the past year? 0 0 No   Number falls in past yr: 0 0   Injury with Fall?  0   Follow up Falls evaluation completed        Data saved with a previous flowsheet row definition       06/03/2024   11:48 AM 04/29/2024    3:15 PM 12/12/2022    4:18 PM  PHQ9 SCORE ONLY  PHQ-9 Total Score 0 2  1      Data saved with a previous flowsheet row definition    There are no preventive care reminders to display for this patient.  Health Maintenance Due  Topic Date Due   Medicare Annual Wellness (AWV)  Never done   FOOT EXAM  Never done   OPHTHALMOLOGY EXAM  Never done   Pneumococcal Vaccine: 50+ Years (1 of 2 - PCV) Never done   Hepatitis B Vaccines 19-59 Average Risk (1 of 3 - 19+ 3-dose series) Never done   Colonoscopy  Never done   Zoster Vaccines- Shingrix (1 of 2) Never done   HEMOGLOBIN A1C  04/17/2022   Influenza Vaccine  05/07/2024   COVID-19 Vaccine (2 - 2025-26 season) 06/07/2024      History/P.E. limitations: {exam; limitations  ed:60112}  There are no preventive care reminders to display for this patient.  Diabetes Health Maintenance Due  Topic Date Due   FOOT EXAM  Never done   OPHTHALMOLOGY EXAM  Never done   HEMOGLOBIN A1C  04/17/2022    Health Maintenance Due  Topic Date Due   Medicare Annual Wellness (AWV)  Never done   FOOT EXAM  Never done   OPHTHALMOLOGY EXAM  Never done   Pneumococcal Vaccine: 50+ Years (1 of 2 - PCV) Never done   Hepatitis B Vaccines 19-59 Average Risk (1 of 3 - 19+ 3-dose series) Never done   Colonoscopy  Never done   Zoster Vaccines- Shingrix (1 of 2) Never done   HEMOGLOBIN A1C  04/17/2022   Influenza Vaccine  05/07/2024   COVID-19 Vaccine (2 - 2025-26 season) 06/07/2024     No chief complaint on file.    Discussed the use of AI scribe software for clinical note transcription with the patient, who gave verbal consent to proceed.  History of Present Illness      SDOH Screenings   Food Insecurity: No Food Insecurity (06/10/2024)  Housing:  Low Risk  (06/10/2024)  Transportation Needs: No Transportation Needs (06/10/2024)  Utilities: Not At Risk (06/03/2024)  Depression (PHQ2-9): Low Risk  (06/03/2024)  Financial Resource Strain: Low Risk  (06/10/2024)  Physical Activity: Insufficiently Active (06/10/2024)  Social Connections: Moderately Integrated (06/10/2024)  Stress: No Stress Concern Present (06/10/2024)  Tobacco Use: Medium Risk (06/11/2024)   --------------------------------------------------------------------------------------------------------------------------------------------- Visit Problem List with Assessment and Plan   Assessment and Plan Assessment & Plan      No problem-specific Assessment & Plan notes found for this encounter.

## 2024-06-17 ENCOUNTER — Encounter: Payer: Self-pay | Admitting: Oncology

## 2024-06-17 ENCOUNTER — Inpatient Hospital Stay

## 2024-06-17 ENCOUNTER — Inpatient Hospital Stay (HOSPITAL_BASED_OUTPATIENT_CLINIC_OR_DEPARTMENT_OTHER): Admitting: Oncology

## 2024-06-17 VITALS — BP 158/58 | HR 52 | Temp 98.4°F | Resp 17 | Ht 70.0 in | Wt 206.0 lb

## 2024-06-17 DIAGNOSIS — E1122 Type 2 diabetes mellitus with diabetic chronic kidney disease: Secondary | ICD-10-CM | POA: Diagnosis not present

## 2024-06-17 DIAGNOSIS — D638 Anemia in other chronic diseases classified elsewhere: Secondary | ICD-10-CM

## 2024-06-17 DIAGNOSIS — Z992 Dependence on renal dialysis: Secondary | ICD-10-CM | POA: Diagnosis not present

## 2024-06-17 DIAGNOSIS — E113599 Type 2 diabetes mellitus with proliferative diabetic retinopathy without macular edema, unspecified eye: Secondary | ICD-10-CM | POA: Diagnosis not present

## 2024-06-17 DIAGNOSIS — Z79899 Other long term (current) drug therapy: Secondary | ICD-10-CM | POA: Diagnosis not present

## 2024-06-17 DIAGNOSIS — I132 Hypertensive heart and chronic kidney disease with heart failure and with stage 5 chronic kidney disease, or end stage renal disease: Secondary | ICD-10-CM | POA: Diagnosis not present

## 2024-06-17 DIAGNOSIS — D696 Thrombocytopenia, unspecified: Secondary | ICD-10-CM | POA: Diagnosis present

## 2024-06-17 DIAGNOSIS — Z794 Long term (current) use of insulin: Secondary | ICD-10-CM | POA: Diagnosis not present

## 2024-06-17 DIAGNOSIS — D631 Anemia in chronic kidney disease: Secondary | ICD-10-CM | POA: Diagnosis not present

## 2024-06-17 DIAGNOSIS — N186 End stage renal disease: Secondary | ICD-10-CM | POA: Diagnosis not present

## 2024-06-17 DIAGNOSIS — I739 Peripheral vascular disease, unspecified: Secondary | ICD-10-CM | POA: Diagnosis not present

## 2024-06-17 LAB — CBC WITH DIFFERENTIAL (CANCER CENTER ONLY)
Abs Immature Granulocytes: 0.01 K/uL (ref 0.00–0.07)
Basophils Absolute: 0.1 K/uL (ref 0.0–0.1)
Basophils Relative: 2 %
Eosinophils Absolute: 0.6 K/uL — ABNORMAL HIGH (ref 0.0–0.5)
Eosinophils Relative: 10 %
HCT: 29.4 % — ABNORMAL LOW (ref 39.0–52.0)
Hemoglobin: 10.2 g/dL — ABNORMAL LOW (ref 13.0–17.0)
Immature Granulocytes: 0 %
Lymphocytes Relative: 12 %
Lymphs Abs: 0.7 K/uL (ref 0.7–4.0)
MCH: 33.1 pg (ref 26.0–34.0)
MCHC: 34.7 g/dL (ref 30.0–36.0)
MCV: 95.5 fL (ref 80.0–100.0)
Monocytes Absolute: 0.8 K/uL (ref 0.1–1.0)
Monocytes Relative: 13 %
Neutro Abs: 3.8 K/uL (ref 1.7–7.7)
Neutrophils Relative %: 63 %
Platelet Count: 80 K/uL — ABNORMAL LOW (ref 150–400)
RBC: 3.08 MIL/uL — ABNORMAL LOW (ref 4.22–5.81)
RDW: 13.9 % (ref 11.5–15.5)
WBC Count: 6 K/uL (ref 4.0–10.5)
nRBC: 0 % (ref 0.0–0.2)

## 2024-06-17 NOTE — Assessment & Plan Note (Addendum)
 Chronic low platelet count with levels around 102,000 in May 2025, with previous counts below normal.   No significant bleeding, bruising, or clotting issues.   On his consultation with us  on 06/03/2024, labs showed platelet count of 66,000. White count 4600 with normal differential. Hemoglobin stable at 10.4. Immature platelet fraction is increased at 11.6%, indicating adequate bone marrow response to thrombocytopenia. Iron studies showed no evidence of iron deficiency.  TSH, B12, folic acid  were also within normal limits.  ANA negative.  Hepatitis and HIV testing were previously negative.  Labs today showed better platelet count at 80,000.  Other blood counts are stable.  Clinical picture consistent with ITP.  Plan to monitor closely and begin treatments with steroids, if platelet count were to trend below 30,000.    He is currently on heparin  during dialysis, which is safe at current platelet levels. - Monitor platelet counts closely. - Educate on signs of low platelets, including unusual bleeding, bruising without trauma, and petechiae. - Advise to report any unusual bleeding or bruising immediately. - Continue heparin  during dialysis as current platelet level is safe. - Schedule follow-up appointment in four weeks to reassess platelet trend.

## 2024-06-17 NOTE — Progress Notes (Signed)
 [facility]  HEMATOLOGY CLINIC PROGRESS NOTE  PATIENT NAME: [name]   MR#: [id number] DOB: Oct 27, 1965  Patient Care Team: Diona Perkins, MD as PCP - General Kate Lonni CROME, MD as PCP - Cardiology (Cardiology) Dialysis, Larue Tinnie Dennise Saralee, MD as Consulting Physician (Nephrology)  Date of visit: 06/17/2024   ASSESSMENT & PLAN:   [name] is a 58 y.o. gentleman with past medical history of DM2, HTN, ESRD on hemodialysis, heart failure with preserved ejection fraction and grade 2 diastolic dysfunction, proliferative diabetic retinopathy requiring eye injections, peripheral vascular disease, aortic atherosclerosis and diffuse coronary atherosclerotic disease was referred to our service in August 2025 for thrombocytopenia.  Likely ITP.    Thrombocytopenia (HCC) Chronic low platelet count with levels around 102,000 in May 2025, with previous counts below normal.   No significant bleeding, bruising, or clotting issues.   On his consultation with us  on 06/03/2024, labs showed platelet count of 66,000. White count 4600 with normal differential. Hemoglobin stable at 10.4. Immature platelet fraction is increased at 11.6%, indicating adequate bone marrow response to thrombocytopenia. Iron studies showed no evidence of iron deficiency.  TSH, B12, folic acid  were also within normal limits.  ANA negative.  Hepatitis and HIV testing were previously negative.  Labs today showed better platelet count at 80,000.  Other blood counts are stable.  Clinical picture consistent with ITP.  Plan to monitor closely and begin treatments with steroids, if platelet count were to trend below 30,000.    He is currently on heparin  during dialysis, which is safe at current platelet levels. - Monitor platelet counts closely. - Educate on signs of low platelets, including unusual bleeding, bruising without trauma, and petechiae. - Advise to report any unusual  bleeding or bruising immediately. - Continue heparin  during dialysis as current platelet level is safe. - Schedule follow-up appointment in four weeks to reassess platelet trend.  Anemia in other chronic diseases classified elsewhere Low hemoglobin levels consistent with end stage renal disease on dialysis. No new symptoms or changes in condition reported. Anemia expected in dialysis patients.   I spent a total of 20 minutes during this encounter with the patient including review of chart and various tests results, discussions about plan of care and coordination of care plan.  I reviewed lab results and outside records for this visit and discussed relevant results with the patient. Diagnosis, plan of care and treatment options were also discussed in detail with the patient. Opportunity provided to ask questions and answers provided to his apparent satisfaction. Provided instructions to call our clinic with any problems, questions or concerns prior to return visit. I recommended to continue follow-up with PCP and sub-specialists. He verbalized understanding and agreed with the plan. No barriers to learning was detected.  Earl Patten, MD  06/17/2024 5:25 PM  [facility] - A DEPT [facility] [address] [place] [address] Dept: [phone] Dept Fax: [phone]   CHIEF COMPLAINT/ REASON FOR VISIT:  Follow-up for thrombocytopenia, likely ITP.  Also has anemia of chronic disease related to ESRD.  INTERVAL HISTORY:  Discussed the use of AI scribe software for clinical note transcription with the patient, who gave verbal consent to proceed.  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 He was referred by Dr. Dennise for evaluation of  low platelet counts.  He is undergoing regular dialysis and receives heparin   throughout the process. No changes have been noted since the last visit. He denies any unusual bleeding or bruising without trauma.  His platelet count was 102,000 when referred by Dr. Dennise. A couple of weeks ago, it dropped to 66,000. Today, the platelet count has improved to 80,000. He continues to receive heparin  during dialysis.    SUMMARY OF HEMATOLOGIC HISTORY:  He was referred by nephrologist Dr. Dennise for evaluation of low platelet count.   Apparently labs at his nephrologist office in June 2025 showed thrombocytopenia and hence a referral was sent to us .  I was not able to find platelet count result in the labs that were sent.   On review of other available records, he was found to have a platelet count of 102,000 on 02/19/2024.  White count was 4500 at that time, hemoglobin 11.7.  Platelet count was mildly decreased at 131,000 in November 2021 and 138,000 in May 2015 and 132,000 in July 2013.   No bleeding, bruising, epistaxis, gum bleeds, or blood in stools, but he does bruise easily.   He had COVID-19 in July 2025, which caused significant fatigue, but he has since recovered and is feeling well.   He is on dialysis and receives heparin  during the procedure. He takes Tylenol  as needed, about once or twice a week, and has recently started a new heart medication for blood pressure. He does not take ibuprofen, Aleve, or Advil.   There is no family history of platelet issues, and he has no history of blood clots. He stopped drinking alcohol four years ago and has a history of diabetes, which he managed with insulin . He attributes his kidney problems to his past heavy drinking and diabetes.   He denies recent bruising/bleeding, such as spontaneous epistaxis, hematuria, melena or hematochezia.   The patient denies history of liver disease, history of cardiac murmur/prior cardiovascular surgery or recent new medications.   He denies prior blood or platelet transfusions.   On his  consultation with us  on 06/03/2024, labs showed platelet count of 66,000. White count 4600 with normal differential. Hemoglobin stable at 10.4. Immature platelet fraction is increased at 11.6%, indicating adequate bone marrow response to thrombocytopenia. Iron studies showed no evidence of iron deficiency.  TSH, B12, folic acid  were also within normal limits.  ANA negative.  Hepatitis and HIV testing were previously negative.   Clinical picture consistent with ITP.  Plan to monitor closely and begin treatments with steroids, if platelet count were to trend below 30,000.  I have reviewed the past medical history, past surgical history, social history and family history with the patient and they are unchanged from previous note.  ALLERGIES: He is allergic to latex, ace inhibitors, and wound dressing adhesive.  MEDICATIONS:  Current Outpatient Medications  Medication Sig Dispense Refill   acetaminophen  (TYLENOL ) 325 MG tablet Take 650 mg by mouth every 6 (six) hours as needed for mild pain or headache.      amLODipine  (NORVASC ) 10 MG tablet Take 10 mg by mouth daily.     aspirin  EC 81 MG tablet Take 81 mg by mouth daily. Swallow whole.     calcium  acetate (PHOSLO) 667 MG capsule Take 2,001 mg by mouth 3 (three) times daily.     calcium  carbonate (TUMS - DOSED IN MG ELEMENTAL CALCIUM ) 500 MG chewable tablet Chew 1 tablet by mouth daily.     carvedilol  (COREG ) 25 MG tablet Take  25 mg by mouth 2 (two) times daily with a meal.     cefadroxil  (DURICEF) 500 MG capsule Take 2 tablets (1000 mg total) on first day. Then take 1 tablet after dialysis session for next 2 dialysis sessions. 4 capsule 0   doxazosin (CARDURA) 2 MG tablet Take 2 mg by mouth daily.     hydrALAZINE  (APRESOLINE ) 25 MG tablet Take 25 mg by mouth 3 (three) times daily.     irbesartan (AVAPRO) 300 MG tablet Take 300 mg by mouth daily.     lidocaine -prilocaine (EMLA) cream Apply 1 application. topically daily as needed (prior to port being  accessed for dialysis).     rosuvastatin  (CRESTOR ) 10 MG tablet Take 10 mg by mouth at bedtime.     torsemide (DEMADEX) 20 MG tablet Take 20 mg by mouth daily.     No current facility-administered medications for this visit.     REVIEW OF SYSTEMS:    Review of Systems - Oncology  All other pertinent systems were reviewed with the patient and are negative.  PHYSICAL EXAMINATION:    Onc Performance Status - 06/17/24 0937       ECOG Perf Status   ECOG Perf Status Fully active, able to carry on all pre-disease performance without restriction      KPS SCALE   KPS % SCORE Normal, no compliants, no evidence of disease          Vitals:   06/17/24 0926  BP: (!) 158/58  Pulse: (!) 52  Resp: 17  Temp: 98.4 F (36.9 C)  SpO2: 96%   Filed Weights   06/17/24 0926  Weight: 206 lb (93.4 kg)    Physical Exam Constitutional:      General: He is not in acute distress.    Appearance: Normal appearance.  HENT:     Head: Normocephalic and atraumatic.  Eyes:     Conjunctiva/sclera: Conjunctivae normal.  Cardiovascular:     Rate and Rhythm: Normal rate and regular rhythm.  Pulmonary:     Effort: Pulmonary effort is normal. No respiratory distress.  Abdominal:     General: There is no distension.  Neurological:     General: No focal deficit present.     Mental Status: He is alert and oriented to person, place, and time.  Psychiatric:        Mood and Affect: Mood normal.        Behavior: Behavior normal.     LABORATORY DATA:   I have reviewed the data as listed.  Results for orders placed or performed in visit on 06/17/24  CBC with Differential (Cancer Center Only)  Result Value Ref Range   WBC Count 6.0 4.0 - 10.5 K/uL   RBC 3.08 (L) 4.22 - 5.81 MIL/uL   Hemoglobin 10.2 (L) 13.0 - 17.0 g/dL   HCT 70.5 (L) 60.9 - 47.9 %   MCV 95.5 80.0 - 100.0 fL   MCH 33.1 26.0 - 34.0 pg   MCHC 34.7 30.0 - 36.0 g/dL   RDW 86.0 88.4 - 84.4 %   Platelet Count 80 (L) 150 - 400  K/uL   nRBC 0.0 0.0 - 0.2 %   Neutrophils Relative % 63 %   Neutro Abs 3.8 1.7 - 7.7 K/uL   Lymphocytes Relative 12 %   Lymphs Abs 0.7 0.7 - 4.0 K/uL   Monocytes Relative 13 %   Monocytes Absolute 0.8 0.1 - 1.0 K/uL   Eosinophils Relative 10 %   Eosinophils Absolute  0.6 (H) 0.0 - 0.5 K/uL   Basophils Relative 2 %   Basophils Absolute 0.1 0.0 - 0.1 K/uL   Immature Granulocytes 0 %   Abs Immature Granulocytes 0.01 0.00 - 0.07 K/uL    RADIOGRAPHIC STUDIES:  No recent pertinent imaging studies available to review.  Orders Placed This Encounter  Procedures   CBC with Differential (Cancer Center Only)    Standing Status:   Standing    Number of Occurrences:   6    Expiration Date:   06/17/2025     Future Appointments  Date Time Provider Department Center  07/15/2024  9:00 AM [facility] None  07/15/2024  9:30 AM Lempi Edwin, Chinita, MD [facility] None     This document was completed utilizing speech recognition software. Grammatical errors, random word insertions, pronoun errors, and incomplete sentences are an occasional consequence of this system due to software limitations, ambient noise, and hardware issues. Any formal questions or concerns about the content, text or information contained within the body of this dictation should be directly addressed to the provider for clarification.

## 2024-06-17 NOTE — Assessment & Plan Note (Signed)
 Low hemoglobin levels consistent with end stage renal disease on dialysis. No new symptoms or changes in condition reported. Anemia expected in dialysis patients.

## 2024-07-14 ENCOUNTER — Ambulatory Visit: Admitting: Podiatry

## 2024-07-14 DIAGNOSIS — B351 Tinea unguium: Secondary | ICD-10-CM | POA: Diagnosis not present

## 2024-07-14 DIAGNOSIS — M79674 Pain in right toe(s): Secondary | ICD-10-CM

## 2024-07-14 DIAGNOSIS — M79675 Pain in left toe(s): Secondary | ICD-10-CM | POA: Diagnosis not present

## 2024-07-14 NOTE — Progress Notes (Signed)
  Subjective:  Patient ID: Gregorio Worley, male    DOB: [birth year],  MRN: [id number]  Chief Complaint  Patient presents with   Nail Problem    Nail trim    58 y.o. male returns for the above complaint.  Patient presents with thickened and Largay dystrophic mycotic toenails x 10 mild pain on palpation hurts with ambulation for pressure has not seen and was prior to seeing me denies any other acute complaints  Objective:  There were no vitals filed for this visit. Podiatric Exam: Vascular: dorsalis pedis and posterior tibial pulses are palpable bilateral. Capillary return is immediate. Temperature gradient is WNL. Skin turgor WNL  Sensorium: Normal Semmes Weinstein monofilament test. Normal tactile sensation bilaterally. Nail Exam: Pt has thick disfigured discolored nails with subungual debris noted bilateral entire nail hallux through fifth toenails.  Pain on palpation to the nails. Ulcer Exam: There is no evidence of ulcer or pre-ulcerative changes or infection. Orthopedic Exam: Muscle tone and strength are WNL. No limitations in general ROM. No crepitus or effusions noted.  Skin: No Porokeratosis. No infection or ulcers    Assessment & Plan:   1. Pain due to onychomycosis of toenails of both feet     Patient was evaluated and treated and all questions answered.  Onychomycosis with pain  -Nails palliatively debrided as below. -Educated on self-care  Procedure: Nail Debridement Rationale: pain  Type of Debridement: manual, sharp debridement. Instrumentation: Nail nipper, rotary burr. Number of Nails: 10  Procedures and Treatment: Consent by patient was obtained for treatment procedures. The patient understood the discussion of treatment and procedures well. All questions were answered thoroughly reviewed. Debridement of mycotic and hypertrophic toenails, 1 through 5 bilateral and clearing of subungual debris. No ulceration, no infection noted.  Return Visit-Office Procedure:  Patient instructed to return to the office for a follow up visit 3 months for continued evaluation and treatment.  Franky Blanch, DPM    No follow-ups on file.

## 2024-07-15 ENCOUNTER — Telehealth: Payer: Self-pay | Admitting: Oncology

## 2024-07-15 ENCOUNTER — Inpatient Hospital Stay

## 2024-07-15 ENCOUNTER — Inpatient Hospital Stay: Admitting: Oncology

## 2024-07-15 NOTE — Telephone Encounter (Signed)
 [name] has re-scheduled to see Dr. Autumn on next Thursday.

## 2024-07-20 ENCOUNTER — Ambulatory Visit (INDEPENDENT_AMBULATORY_CARE_PROVIDER_SITE_OTHER): Admitting: Family Medicine

## 2024-07-20 VITALS — BP 179/66 | HR 58 | Ht 71.0 in | Wt 204.4 lb

## 2024-07-20 DIAGNOSIS — H00015 Hordeolum externum left lower eyelid: Secondary | ICD-10-CM | POA: Diagnosis present

## 2024-07-20 DIAGNOSIS — S0502XA Injury of conjunctiva and corneal abrasion without foreign body, left eye, initial encounter: Secondary | ICD-10-CM

## 2024-07-20 DIAGNOSIS — R030 Elevated blood-pressure reading, without diagnosis of hypertension: Secondary | ICD-10-CM

## 2024-07-20 MED ORDER — CIPROFLOXACIN HCL 0.3 % OP SOLN: 2.0000 [drp] | Freq: Two times a day (BID) | OPHTHALMIC | 0 refills | Status: DC

## 2024-07-20 NOTE — Progress Notes (Signed)
    SUBJECTIVE:   CHIEF COMPLAINT / HPI:   Reports he woke up with a bump in the eye this morning.  He felt like his eye was more itchy and he was blinking more than usual.  He denies any purulent discharge or drainage from the eye.  He had the nurse at dialysis look at his eye this morning and she said that it may be a stye.  His wife was concerned he may need antibiotics and so encouraged him to come in and be seen.  PERTINENT  PMH / PSH: Noncontributory  OBJECTIVE:   BP (!) 179/66   Pulse (!) 58   Ht 5' 11 (1.803 m)   Wt 204 lb 6.4 oz (92.7 kg)   SpO2 95%   BMI 28.51 kg/m   General: Well-appearing, no distress HEENT: PERRLA, EOMI, sclerae bilaterally injected but not erythematous.  Visual inspection of the affected eye shows small raised area with increased vascularity in the left eye on the lateral part of the sclera adjacent to the iris.  Inspection of the lower lid shows large red raised papule consistent with a stye.  Fluorescein eye exam performed shows corneal abrasion of the area listed above.  Right eye is grossly normal.  No foreign bodies observed within the eye.  ASSESSMENT/PLAN:   Assessment & Plan Abrasion of left cornea, initial encounter - Scribed Ciloxan  0.3% ophthalmic drops to be used twice a day for the next 14 days -Advised the patient to follow-up in 2 weeks to assess whether or not the abrasion has healed appropriately Hordeolum externum of left lower eyelid - Advised 3 times daily use of warm compress over the same amount of time as the antibiotic eyedrops -Provided guidance on timeline in which the stye should resolve Elevated blood pressure reading Patient's blood pressure was unsatisfactory at initial recording and at repeat.  Patient did not take his antihypertensives today as he typically takes them at 3 PM. -Advised patient to follow-up with his PCP in 2 weeks, at which time he should ensure he has taken his daily antihypertensives prior to being  seen.    Lucie Pinal, DO [facility]

## 2024-07-20 NOTE — Patient Instructions (Addendum)
 It was wonderful to see you today!  You have a bump in your eye called a stye.  This happens when one of the ducts that provides moisturization to your eye gets clogged and gets inflamed.  To treat the stye use a hot compress 3 times a day every day until the stye resolves.  Usually this takes about a week.  You also have a scratch on the surface of your eye.  This is likely why your eye is itchy and you have been blinking more than usual.  For this I have prescribed ciprofloxacin  eye drops. Place two drops in the eye 2 times a day for the next two weeks.   You will need to follow up with your primary doctor in two weeks.   Please call [phone] with any questions about today's appointment.   If you need any additional refills, please call your pharmacy before calling the office.  Lucie Pinal, DO Family Medicine

## 2024-07-22 ENCOUNTER — Inpatient Hospital Stay: Admitting: Oncology

## 2024-07-22 ENCOUNTER — Inpatient Hospital Stay: Attending: Oncology

## 2024-07-22 DIAGNOSIS — D696 Thrombocytopenia, unspecified: Secondary | ICD-10-CM | POA: Insufficient documentation

## 2024-07-22 LAB — CBC WITH DIFFERENTIAL (CANCER CENTER ONLY)
Abs Immature Granulocytes: 0 K/uL (ref 0.00–0.07)
Basophils Absolute: 0.1 K/uL (ref 0.0–0.1)
Basophils Relative: 2 %
Eosinophils Absolute: 0.6 K/uL — ABNORMAL HIGH (ref 0.0–0.5)
Eosinophils Relative: 14 %
HCT: 33.2 % — ABNORMAL LOW (ref 39.0–52.0)
Hemoglobin: 11.6 g/dL — ABNORMAL LOW (ref 13.0–17.0)
Immature Granulocytes: 0 %
Lymphocytes Relative: 16 %
Lymphs Abs: 0.7 K/uL (ref 0.7–4.0)
MCH: 33.1 pg (ref 26.0–34.0)
MCHC: 34.9 g/dL (ref 30.0–36.0)
MCV: 94.9 fL (ref 80.0–100.0)
Monocytes Absolute: 0.6 K/uL (ref 0.1–1.0)
Monocytes Relative: 13 %
Neutro Abs: 2.5 K/uL (ref 1.7–7.7)
Neutrophils Relative %: 55 %
Platelet Count: 64 K/uL — ABNORMAL LOW (ref 150–400)
RBC: 3.5 MIL/uL — ABNORMAL LOW (ref 4.22–5.81)
RDW: 13.7 % (ref 11.5–15.5)
WBC Count: 4.4 K/uL (ref 4.0–10.5)
nRBC: 0 % (ref 0.0–0.2)

## 2024-07-28 ENCOUNTER — Telehealth: Payer: Self-pay

## 2024-07-28 ENCOUNTER — Inpatient Hospital Stay: Admitting: Oncology

## 2024-07-28 NOTE — Telephone Encounter (Signed)
 Spoke with patient on phone regarding his missed Lab and Hematology appointments. Patient notified that a scheduler will reach out to him to reschedule his missed appointments today.

## 2024-07-29 NOTE — Telephone Encounter (Signed)
 Opened in error

## 2024-08-13 ENCOUNTER — Ambulatory Visit (HOSPITAL_COMMUNITY)
Admission: RE | Admit: 2024-08-13 | Discharge: 2024-08-13 | Disposition: A | Discharge status: Home / Self Care | Source: Ambulatory Visit | Attending: Emergency Medicine | Admitting: Emergency Medicine

## 2024-08-13 ENCOUNTER — Encounter (HOSPITAL_COMMUNITY): Payer: Self-pay

## 2024-08-13 VITALS — BP 184/75 | HR 60 | Temp 97.9°F | Resp 16

## 2024-08-13 DIAGNOSIS — H00025 Hordeolum internum left lower eyelid: Secondary | ICD-10-CM

## 2024-08-13 MED ORDER — MOXIFLOXACIN HCL 0.5 % OP SOLN: 1.0000 [drp] | Freq: Three times a day (TID) | OPHTHALMIC | 0 refills | Status: DC

## 2024-08-13 NOTE — ED Triage Notes (Signed)
 Pt states he thinks he has a stye in his left eye.  States he has been having irritation for the past 2 days.

## 2024-08-13 NOTE — Discharge Instructions (Signed)
 Apply 1 drop of Vigamox 3 times daily for the next 5 days for infection coverage related to stye. Otherwise apply warm compresses to help reduce presence of stye. Follow-up with your primary care provider or return here as needed.

## 2024-08-13 NOTE — ED Provider Notes (Signed)
 [facility]    CSN: [id number] Arrival date & time: 08/13/24  9041      History   Chief Complaint Chief Complaint  Patient presents with   Eye Problem    HPI [name] is a 58 y.o. male.   Patient presents with concerns for stye to his left lower eyelid.  Patient states that he has had some swelling and irritation to his left lower eyelid for about 2 days.  Patient states that he has had a stye in the past and received eyedrop antibiotics and is requesting same for this as this helped the last time.  Patient denies any visual disturbances, eye, or drainage from the eye.  The history is provided by the patient and medical records.  Eye Problem   Past Medical History:  Diagnosis Date   Allergy    Diabetes mellitus    Type 2 DM, Patient reports DM Type 1 diagnosd age at age 60   Diabetic foot ulcers (HCC)    ESRD (end stage renal disease) (HCC)    [facility]   Foot callus 07/01/2018   Foreign body in left foot    with  infection   Hypertension    Meniscus tear    Multiple rib fractures    Personal history of diabetic foot ulcer, Left Foot    Type 2 diabetes mellitus with stage 4 chronic kidney disease, without long-term current use of insulin  (HCC) 06/01/2012    Patient Active Problem List   Diagnosis Date Noted   Thrombocytopenia 06/03/2024   Anemia in other chronic diseases classified elsewhere 06/03/2024   CAD (coronary artery disease) 04/29/2024   Positive cardiac stress test 05/02/2022   Trigger finger, left middle finger 01/01/2022   ESRD (end stage renal disease) on dialysis (HCC) 01/10/2021   Hyperlipidemia 05/28/2016   Hypertension 04/14/2012   History of diabetes mellitus 04/13/2012    Past Surgical History:  Procedure Laterality Date   A/V FISTULAGRAM Right 03/26/2021   Procedure: A/V FISTULAGRAM;  Surgeon: Sheree Penne Bruckner, MD;  Location: [facility] INVASIVE CV LAB;  Service: Cardiovascular;  Laterality: Right;   A/V  FISTULAGRAM N/A 04/19/2024   Procedure: A/V Fistulagram;  Surgeon: Lanis Fonda BRAVO, MD;  Location: [facility];  Service: Cardiovascular;  Laterality: N/A;   AV FISTULA PLACEMENT Left 11/06/2020   Procedure: LEFT ARM FIRST STAGE BRACHIAL BASILIC ARTERIOVENOUS (AV) FISTULA CREATION;  Surgeon: Gretta Bruckner PARAS, MD;  Location: [facility];  Service: Vascular;  Laterality: Left;   AV FISTULA PLACEMENT Right 12/05/2020   Procedure: RADIOCEPHALIC ARTERIOVENOUS (AV) FISTULA CREATION RIGHT ARM;  Surgeon: Eliza Bruckner RAMAN, MD;  Location: [facility] OR;  Service: Vascular;  Laterality: Right;   EYE SURGERY Bilateral    lazer   I & D EXTREMITY  04/23/2012   Procedure: IRRIGATION AND DEBRIDEMENT EXTREMITY;  Surgeon: Jerona LULLA Sage, MD;  Location: [facility] OR;  Service: Orthopedics;  Laterality: Left;  Irrigation and Debridement Left Foot   I & D EXTREMITY  09/18/2012   Procedure: IRRIGATION AND DEBRIDEMENT EXTREMITY;  Surgeon: Jerona LULLA Sage, MD;  Location: [facility] OR;  Service: Orthopedics;  Laterality: Left;   IR PERC TUN PERIT CATH WO PORT S&I /IMAG  08/30/2020   IR REMOVAL TUN CV CATH W/O FL  04/17/2021   IR US  GUIDE VASC ACCESS RIGHT  08/30/2020   LIGATION OF ARTERIOVENOUS  FISTULA Left 12/05/2020   Procedure: LIGATION OF ARTERIOVENOUS  FISTULA LEFT ARM;  Surgeon: Eliza Bruckner RAMAN, MD;  Location:  [facility] OR;  Service: Vascular;  Laterality: Left;   MENISCUS REPAIR Left 04/2016   Orthopedist, Dr Daldorf in [place], [place]       Home Medications    Prior to Admission medications   Medication Sig Start Date End Date Taking? Authorizing Provider  moxifloxacin (VIGAMOX) 0.5 % ophthalmic solution Place 1 drop into the left eye 3 (three) times daily. 08/13/24  Yes Johnie, Janith Nielson A, NP  acetaminophen  (TYLENOL ) 325 MG tablet Take 650 mg by mouth every 6 (six) hours as needed for mild pain or headache.     [provider]  amLODipine  (NORVASC ) 10 MG tablet Take 10 mg by mouth daily. 11/18/18   [provider]   aspirin  EC 81 MG tablet Take 81 mg by mouth daily. Swallow whole.    [provider]  calcium  acetate (PHOSLO) 667 MG capsule Take 2,001 mg by mouth 3 (three) times daily. 11/28/20   [provider]  calcium  carbonate (TUMS - DOSED IN MG ELEMENTAL CALCIUM ) 500 MG chewable tablet Chew 1 tablet by mouth daily.    [provider]  carvedilol  (COREG ) 25 MG tablet Take 25 mg by mouth 2 (two) times daily with a meal. 09/29/21   [provider]  cefadroxil  (DURICEF) 500 MG capsule Take 2 tablets (1000 mg total) on first day. Then take 1 tablet after dialysis session for next 2 dialysis sessions. 06/11/24   Diona Perkins, MD  doxazosin (CARDURA) 2 MG tablet Take 2 mg by mouth daily.    [provider]  hydrALAZINE  (APRESOLINE ) 25 MG tablet Take 25 mg by mouth 3 (three) times daily.    [provider]  irbesartan (AVAPRO) 300 MG tablet Take 300 mg by mouth daily.    [provider]  lidocaine -prilocaine (EMLA) cream Apply 1 application. topically daily as needed (prior to port being accessed for dialysis). 02/26/21   [provider]  rosuvastatin  (CRESTOR ) 10 MG tablet Take 10 mg by mouth at bedtime. 06/16/24   [provider]  torsemide (DEMADEX) 20 MG tablet Take 20 mg by mouth daily. 05/14/24   [provider]    Family History Family History  Problem Relation Age of Onset   Diabetes type II Mother    Arthritis Mother    Diabetes Mother    Hyperlipidemia Mother    Hypertension Mother    Diabetes Father    Diabetes type II Sister    Colon cancer Neg Hx    Stomach cancer Neg Hx    Esophageal cancer Neg Hx    Pancreatic cancer Neg Hx     Social History Social History   Tobacco Use   Smoking status: Former    Current packs/day: 0.00    Types: Cigarettes    Quit date: 09/03/2012    Years since quitting: 11.9   Smokeless tobacco: Former    Quit date: 09/03/2012  Vaping Use   Vaping status: Never Used   Substance Use Topics   Alcohol use: Not Currently   Drug use: Not Currently     Allergies   Latex, Ace inhibitors, and Wound dressing adhesive   Review of Systems Review of Systems  Per HPI  Physical Exam Triage Vital Signs ED Triage Vitals [08/13/24 1020]  Encounter Vitals Group     BP (!) 184/75     Girls Systolic BP Percentile      Girls Diastolic BP Percentile      Boys Systolic BP Percentile      Boys  Diastolic BP Percentile      Pulse Rate 60     Resp 16     Temp 97.9 F (36.6 C)     Temp Source Oral     SpO2 92 %     Weight      Height      Head Circumference      Peak Flow      Pain Score 6     Pain Loc      Pain Education      Exclude from Growth Chart    No data found.  Updated Vital Signs BP (!) 184/75 (BP Location: Left Arm)   Pulse 60   Temp 97.9 F (36.6 C) (Oral)   Resp 16   SpO2 92%   Visual Acuity Right Eye Distance:   Left Eye Distance:   Bilateral Distance:    Right Eye Near:   Left Eye Near:    Bilateral Near:     Physical Exam Vitals and nursing note reviewed.  Constitutional:      General: He is awake. He is not in acute distress.    Appearance: Normal appearance. He is well-developed and well-groomed. He is not ill-appearing.  Eyes:     General:        Left eye: Hordeolum present.No foreign body or discharge.     Conjunctiva/sclera:     Left eye: Left conjunctiva is not injected. No exudate or hemorrhage.    Comments: Hordeolum internum noted to the left lower eyelid with mild swelling  Skin:    General: Skin is warm and dry.  Neurological:     Mental Status: He is alert.  Psychiatric:        Behavior: Behavior is cooperative.      UC Treatments / Results  Labs (all labs ordered are listed, but only abnormal results are displayed) Labs Reviewed - No data to display  EKG   Radiology No results found.  Procedures Procedures (including critical care time)  Medications Ordered in UC Medications - No  data to display  Initial Impression / Assessment and Plan / UC Course  I have reviewed the triage vital signs and the nursing notes.  Pertinent labs & imaging results that were available during my care of the patient were reviewed by me and considered in my medical decision making (see chart for details).     Patient is overall well-appearing.  Vitals are stable.  Exam findings consistent with hordeolum internum.  Prescribed Vigamox eyedrops for infection coverage related to this.  Recommended use of warm compresses.  Discussed follow-up and return precautions. Final Clinical Impressions(s) / UC Diagnoses   Final diagnoses:  Hordeolum internum left lower eyelid     Discharge Instructions      Apply 1 drop of Vigamox 3 times daily for the next 5 days for infection coverage related to stye. Otherwise apply warm compresses to help reduce presence of stye. Follow-up with your primary care provider or return here as needed.   ED Prescriptions     Medication Sig Dispense Auth. Provider   moxifloxacin (VIGAMOX) 0.5 % ophthalmic solution Place 1 drop into the left eye 3 (three) times daily. 3 mL Johnie Flaming A, NP      PDMP not reviewed this encounter.   Johnie Flaming A, NP 08/13/24 1113

## 2024-08-27 ENCOUNTER — Other Ambulatory Visit

## 2024-08-27 ENCOUNTER — Ambulatory Visit: Admitting: Oncology

## 2024-08-30 ENCOUNTER — Ambulatory Visit (HOSPITAL_COMMUNITY)
Admission: EM | Admit: 2024-08-30 | Discharge: 2024-08-30 | Disposition: A | Discharge status: Home / Self Care | Attending: Physician Assistant | Admitting: Physician Assistant

## 2024-08-30 ENCOUNTER — Other Ambulatory Visit: Payer: Self-pay

## 2024-08-30 ENCOUNTER — Encounter (HOSPITAL_COMMUNITY): Payer: Self-pay | Admitting: Emergency Medicine

## 2024-08-30 ENCOUNTER — Ambulatory Visit (INDEPENDENT_AMBULATORY_CARE_PROVIDER_SITE_OTHER)

## 2024-08-30 DIAGNOSIS — R051 Acute cough: Secondary | ICD-10-CM

## 2024-08-30 DIAGNOSIS — J329 Chronic sinusitis, unspecified: Secondary | ICD-10-CM

## 2024-08-30 DIAGNOSIS — J40 Bronchitis, not specified as acute or chronic: Secondary | ICD-10-CM | POA: Diagnosis not present

## 2024-08-30 LAB — POC SOFIA SARS ANTIGEN FIA: SARS Coronavirus 2 Ag: NEGATIVE

## 2024-08-30 LAB — POCT INFLUENZA A/B
Influenza A, POC: NEGATIVE
Influenza B, POC: NEGATIVE

## 2024-08-30 MED ORDER — DOXYCYCLINE HYCLATE 100 MG PO CAPS: 100.0000 mg | ORAL_CAPSULE | Freq: Two times a day (BID) | ORAL | 0 refills | Status: DC

## 2024-08-30 MED ORDER — BENZONATATE 100 MG PO CAPS: 100.0000 mg | ORAL_CAPSULE | Freq: Three times a day (TID) | ORAL | 0 refills | Status: DC

## 2024-08-30 NOTE — ED Notes (Signed)
 Patient is in xray

## 2024-08-30 NOTE — ED Provider Notes (Signed)
 [facility]    CSN: [id number] Arrival date & time: 08/30/24  1018      History   Chief Complaint Chief Complaint  Patient presents with   Cough    HPI [name] is a 58 y.o. male.   Patient presents today with a 3-day history of URI symptoms.  He reports sore throat, nasal congestion, cough, some shortness of breath beginning today.  He denies any fever, chest pain, ongoing vomiting.  He did have 1 episode of posttussive emesis a few days ago but has not had recurrent GI symptoms.  He reports that his wife was sick with similar symptoms but does not know what she had; they did a COVID test at home that was negative but she did not seek medical care as her symptoms resolved quickly.  He has had COVID with last episode several months ago.  He has not had COVID-19 vaccines but has had his flu shot about 1 month ago.  He denies any recent antibiotics or steroids.  He is not taking any over-the-counter medication for symptom management.  He is anxious to feel better as he is scheduled to go out of town in a few days for a family reunion.  He does have a history of end-stage renal disease on Monday, Wednesday, Friday dialysis; he missed today because of URI symptoms prompting evaluation today.    Past Medical History:  Diagnosis Date   Allergy    Diabetes mellitus    Type 2 DM, Patient reports DM Type 1 diagnosd age at age 89   Diabetic foot ulcers (HCC)    ESRD (end stage renal disease) (HCC)    [facility]   Foot callus 07/01/2018   Foreign body in left foot    with  infection   Hypertension    Meniscus tear    Multiple rib fractures    Personal history of diabetic foot ulcer, Left Foot    Type 2 diabetes mellitus with stage 4 chronic kidney disease, without long-term current use of insulin  (HCC) 06/01/2012    Patient Active Problem List   Diagnosis Date Noted   Thrombocytopenia 06/03/2024   Anemia in other chronic diseases classified elsewhere  06/03/2024   CAD (coronary artery disease) 04/29/2024   Positive cardiac stress test 05/02/2022   Trigger finger, left middle finger 01/01/2022   ESRD (end stage renal disease) on dialysis (HCC) 01/10/2021   Hyperlipidemia 05/28/2016   Hypertension 04/14/2012   History of diabetes mellitus 04/13/2012    Past Surgical History:  Procedure Laterality Date   A/V FISTULAGRAM Right 03/26/2021   Procedure: A/V FISTULAGRAM;  Surgeon: Sheree Penne Bruckner, MD;  Location: [facility] INVASIVE CV LAB;  Service: Cardiovascular;  Laterality: Right;   A/V FISTULAGRAM N/A 04/19/2024   Procedure: A/V Fistulagram;  Surgeon: Lanis Fonda BRAVO, MD;  Location: [facility];  Service: Cardiovascular;  Laterality: N/A;   AV FISTULA PLACEMENT Left 11/06/2020   Procedure: LEFT ARM FIRST STAGE BRACHIAL BASILIC ARTERIOVENOUS (AV) FISTULA CREATION;  Surgeon: Gretta Bruckner PARAS, MD;  Location: [facility] OR;  Service: Vascular;  Laterality: Left;   AV FISTULA PLACEMENT Right 12/05/2020   Procedure: RADIOCEPHALIC ARTERIOVENOUS (AV) FISTULA CREATION RIGHT ARM;  Surgeon: Eliza Bruckner RAMAN, MD;  Location: [facility] OR;  Service: Vascular;  Laterality: Right;   EYE SURGERY Bilateral    lazer   I & D EXTREMITY  04/23/2012   Procedure: IRRIGATION AND DEBRIDEMENT EXTREMITY;  Surgeon: Jerona LULLA Sage, MD;  Location[facility];  Service: Orthopedics;  Laterality: Left;  Irrigation and Debridement Left Foot   I & D EXTREMITY  09/18/2012   Procedure: IRRIGATION AND DEBRIDEMENT EXTREMITY;  Surgeon: Jerona LULLA Sage, MD;  Location: [facility];  Service: Orthopedics;  Laterality: Left;   IR PERC TUN PERIT CATH WO PORT S&I /IMAG  08/30/2020   IR REMOVAL TUN CV CATH W/O FL  04/17/2021   IR US  GUIDE VASC ACCESS RIGHT  08/30/2020   LIGATION OF ARTERIOVENOUS  FISTULA Left 12/05/2020   Procedure: LIGATION OF ARTERIOVENOUS  FISTULA LEFT ARM;  Surgeon: Eliza Lonni RAMAN, MD;  Location: [facility] OR;  Service: Vascular;  Laterality: Left;   MENISCUS REPAIR Left 04/2016    Orthopedist, Dr Daldorf in [place], [place]       Home Medications    Prior to Admission medications   Medication Sig Start Date End Date Taking? Authorizing Provider  benzonatate  (TESSALON ) 100 MG capsule Take 1 capsule (100 mg total) by mouth every 8 (eight) hours. 08/30/24  Yes Marke Goodwyn, Rocky POUR, PA-C  doxycycline  (VIBRAMYCIN ) 100 MG capsule Take 1 capsule (100 mg total) by mouth 2 (two) times daily. 08/30/24  Yes Evelyn Moch K, PA-C  acetaminophen  (TYLENOL ) 325 MG tablet Take 650 mg by mouth every 6 (six) hours as needed for mild pain or headache.     [provider]  amLODipine  (NORVASC ) 10 MG tablet Take 10 mg by mouth daily. 11/18/18   [provider]  aspirin  EC 81 MG tablet Take 81 mg by mouth daily. Swallow whole.    [provider]  calcium  acetate (PHOSLO) 667 MG capsule Take 2,001 mg by mouth 3 (three) times daily. 11/28/20   [provider]  calcium  carbonate (TUMS - DOSED IN MG ELEMENTAL CALCIUM ) 500 MG chewable tablet Chew 1 tablet by mouth daily.    [provider]  carvedilol  (COREG ) 25 MG tablet Take 25 mg by mouth 2 (two) times daily with a meal. 09/29/21   [provider]  cefadroxil  (DURICEF) 500 MG capsule Take 2 tablets (1000 mg total) on first day. Then take 1 tablet after dialysis session for next 2 dialysis sessions. 06/11/24   Diona Perkins, MD  doxazosin (CARDURA) 2 MG tablet Take 2 mg by mouth daily.    [provider]  hydrALAZINE  (APRESOLINE ) 25 MG tablet Take 25 mg by mouth 3 (three) times daily.    [provider]  irbesartan (AVAPRO) 300 MG tablet Take 300 mg by mouth daily.    [provider]  lidocaine -prilocaine (EMLA) cream Apply 1 application. topically daily as needed (prior to port being accessed for dialysis). 02/26/21   [provider]  rosuvastatin  (CRESTOR ) 10 MG tablet Take 10 mg by mouth at bedtime. 06/16/24   [provider]  torsemide (DEMADEX) 20 MG tablet  Take 20 mg by mouth daily. 05/14/24   [provider]    Family History Family History  Problem Relation Age of Onset   Diabetes type II Mother    Arthritis Mother    Diabetes Mother    Hyperlipidemia Mother    Hypertension Mother    Diabetes Father    Diabetes type II Sister    Colon cancer Neg Hx    Stomach cancer Neg Hx    Esophageal cancer Neg Hx    Pancreatic cancer Neg Hx     Social History Social History   Tobacco Use   Smoking status: Former    Current packs/day: 0.00    Types: Cigarettes  Quit date: 09/03/2012    Years since quitting: 11.9   Smokeless tobacco: Former    Quit date: 09/03/2012  Vaping Use   Vaping status: Never Used  Substance Use Topics   Alcohol use: Not Currently   Drug use: Not Currently     Allergies   Latex, Ace inhibitors, and Wound dressing adhesive   Review of Systems Review of Systems  Constitutional:  Positive for activity change. Negative for appetite change, fatigue and fever.  HENT:  Positive for congestion and sore throat. Negative for sinus pressure and sneezing.   Respiratory:  Positive for cough and shortness of breath.   Cardiovascular:  Negative for chest pain.  Gastrointestinal:  Positive for nausea. Negative for abdominal pain, diarrhea and vomiting (Posttussive emesis yesterday).  Musculoskeletal:  Negative for arthralgias and myalgias.  Neurological:  Negative for dizziness, light-headedness and headaches.     Physical Exam Triage Vital Signs ED Triage Vitals  Encounter Vitals Group     BP 08/30/24 1116 (!) 170/76     Girls Systolic BP Percentile --      Girls Diastolic BP Percentile --      Boys Systolic BP Percentile --      Boys Diastolic BP Percentile --      Pulse Rate 08/30/24 1116 (!) 50     Resp 08/30/24 1116 20     Temp 08/30/24 1116 (!) 97.5 F (36.4 C)     Temp Source 08/30/24 1116 Oral     SpO2 08/30/24 1116 96 %     Weight --      Height --      Head Circumference --      Peak  Flow --      Pain Score 08/30/24 1113 7     Pain Loc --      Pain Education --      Exclude from Growth Chart --    No data found.  Updated Vital Signs BP (!) 170/76 (BP Location: Left Arm)   Pulse (!) 50   Temp (!) 97.5 F (36.4 C) (Oral)   Resp 20   SpO2 96%   Visual Acuity Right Eye Distance:   Left Eye Distance:   Bilateral Distance:    Right Eye Near:   Left Eye Near:    Bilateral Near:     Physical Exam Vitals reviewed.  Constitutional:      General: He is awake.     Appearance: Normal appearance. He is well-developed. He is not ill-appearing.     Comments: Very pleasant male appears stated age in no acute distress sitting comfortably in exam room  HENT:     Head: Normocephalic and atraumatic.     Right Ear: Tympanic membrane, ear canal and external ear normal. Tympanic membrane is not erythematous or bulging.     Left Ear: Tympanic membrane, ear canal and external ear normal. Tympanic membrane is not erythematous or bulging.     Nose: Nose normal.     Mouth/Throat:     Pharynx: Uvula midline. Posterior oropharyngeal erythema and postnasal drip present. No oropharyngeal exudate or uvula swelling.  Cardiovascular:     Rate and Rhythm: Regular rhythm. Bradycardia present.     Heart sounds: Normal heart sounds, S1 normal and S2 normal. No murmur heard. Pulmonary:     Effort: Pulmonary effort is normal. No accessory muscle usage or respiratory distress.     Breath sounds: Normal breath sounds. No stridor. No wheezing, rhonchi or rales.  Comments: Clear to auscultation bilaterally Neurological:     Mental Status: He is alert.  Psychiatric:        Behavior: Behavior is cooperative.      UC Treatments / Results  Labs (all labs ordered are listed, but only abnormal results are displayed) Labs Reviewed  POC SOFIA SARS ANTIGEN FIA  POCT INFLUENZA A/B    EKG   Radiology DG Chest 2 View Result Date: 08/30/2024 CLINICAL DATA:  Acute cough. EXAM: CHEST -  2 VIEW COMPARISON:  Chest radiograph dated 08/11/2023. FINDINGS: There is diffuse bilateral perihilar interstitial densities, similar or slightly improved since the prior radiograph. No consolidative changes. There is no pleural effusion pneumothorax. Stable cardiac silhouette. No acute osseous pathology. IMPRESSION: Diffuse bilateral perihilar interstitial densities, similar or slightly improved since the prior radiograph. Electronically Signed   By: Vanetta Chou M.D.   On: 08/30/2024 13:13    Procedures Procedures (including critical care time)  Medications Ordered in UC Medications - No data to display  Initial Impression / Assessment and Plan / UC Course  I have reviewed the triage vital signs and the nursing notes.  Pertinent labs & imaging results that were available during my care of the patient were reviewed by me and considered in my medical decision making (see chart for details).     Patient is well-appearing, afebrile, nontoxic, nontachycardic.  He was negative for COVID and flu in clinic.  Chest x-ray was obtained that showed stable perihilar densities with no acute cardiopulmonary disease.  He was instructed to follow-up with his primary care about this.  Given his increasing cough and sputum production will cover with doxycycline  which does not need to be dose adjusted for hemodialysis.  Recommended he rest and drink plenty of fluid.  He was also given Tessalon  for cough.  Recommend close follow-up with his primary care.  We discussed that if he is not feeling better or if anything worsens and he has high fever, worsening cough, shortness of breath, chest pain he is to be seen emergently.  Strict return precautions given.  Excuse note provided.  Final Clinical Impressions(s) / UC Diagnoses   Final diagnoses:  Acute cough  Sinobronchitis     Discharge Instructions      You tested negative for COVID and flu.  Your chest x-ray did not show any evidence of pneumonia but  did show some stable densities.  Please follow-up with your primary care about this.  Start doxycycline  100 mg twice daily for 10 days.  You can use Tessalon  up to 3 times a day for cough.  Make sure that you are resting and drinking plenty of fluid.  If your symptoms are not improving within 3 to 5 days or if anything worsens and you have fever, worsening cough, shortness of breath, chest pain, nausea/vomiting you need to be seen immediately.  Follow-up with your primary care this week for reevaluation.     ED Prescriptions     Medication Sig Dispense Auth. Provider   doxycycline  (VIBRAMYCIN ) 100 MG capsule Take 1 capsule (100 mg total) by mouth 2 (two) times daily. 20 capsule Izora Benn K, PA-C   benzonatate  (TESSALON ) 100 MG capsule Take 1 capsule (100 mg total) by mouth every 8 (eight) hours. 21 capsule Rochell Mabie K, PA-C      PDMP not reviewed this encounter.   Sherrell Rocky POUR, PA-C 08/30/24 1335

## 2024-08-30 NOTE — Discharge Instructions (Signed)
 You tested negative for COVID and flu.  Your chest x-ray did not show any evidence of pneumonia but did show some stable densities.  Please follow-up with your primary care about this.  Start doxycycline  100 mg twice daily for 10 days.  You can use Tessalon  up to 3 times a day for cough.  Make sure that you are resting and drinking plenty of fluid.  If your symptoms are not improving within 3 to 5 days or if anything worsens and you have fever, worsening cough, shortness of breath, chest pain, nausea/vomiting you need to be seen immediately.  Follow-up with your primary care this week for reevaluation.

## 2024-08-30 NOTE — ED Triage Notes (Signed)
 Complains of cough, sore throat, chest tightness and coughing keeping patient awake at night.  Symptoms started 3 days ago.  Reports he is coughing up green phlegm.  No known fever  Patient has had sudafed-did not help.    Has dialysis Monday-Wednesday and Friday.  Did not go to dialysis today

## 2024-09-10 ENCOUNTER — Encounter (HOSPITAL_COMMUNITY): Payer: Self-pay

## 2024-09-13 ENCOUNTER — Other Ambulatory Visit: Payer: Self-pay

## 2024-09-13 ENCOUNTER — Ambulatory Visit (HOSPITAL_COMMUNITY)
Admission: RE | Admit: 2024-09-13 | Discharge: 2024-09-13 | Disposition: A | Attending: Vascular Surgery | Admitting: Vascular Surgery

## 2024-09-13 ENCOUNTER — Encounter (HOSPITAL_COMMUNITY): Admission: RE | Disposition: A | Payer: Self-pay | Source: Home / Self Care | Attending: Vascular Surgery

## 2024-09-13 DIAGNOSIS — T82898A Other specified complication of vascular prosthetic devices, implants and grafts, initial encounter: Secondary | ICD-10-CM | POA: Diagnosis present

## 2024-09-13 HISTORY — PX: A/V FISTULAGRAM: CATH118298

## 2024-09-13 SURGERY — A/V FISTULAGRAM
Anesthesia: LOCAL | Site: Arm Lower | Laterality: Right

## 2024-09-13 MED ORDER — LIDOCAINE HCL (PF) 1 % IJ SOLN
INTRAMUSCULAR | Status: DC | PRN
  Administered 2024-09-13: 5 mL

## 2024-09-13 MED ORDER — HEPARIN (PORCINE) IN NACL 1000-0.9 UT/500ML-% IV SOLN
INTRAVENOUS | Status: DC | PRN
  Administered 2024-09-13: 500 mL

## 2024-09-13 MED ORDER — LIDOCAINE HCL (PF) 1 % IJ SOLN
INTRAMUSCULAR | Status: AC
  Filled 2024-09-13: qty 30

## 2024-09-13 MED ORDER — IODIXANOL 320 MG/ML IV SOLN
INTRAVENOUS | Status: DC | PRN
  Administered 2024-09-13: 25 mL via INTRAVENOUS

## 2024-09-13 SURGICAL SUPPLY — 5 items
KIT MICROPUNCTURE NIT STIFF (SHEATH) IMPLANT
SHEATH PROBE COVER 6X72 (BAG) IMPLANT
STOPCOCK MORSE 400PSI 3WAY (MISCELLANEOUS) IMPLANT
TRAY PV CATH (CUSTOM PROCEDURE TRAY) ×1 IMPLANT
TUBING CIL FLEX 10 FLL-RA (TUBING) IMPLANT

## 2024-09-13 NOTE — Op Note (Signed)
 DATE OF SERVICE: 09/13/2024  PATIENT:  [name]  58 y.o. male  PRE-OPERATIVE DIAGNOSIS:  end-stage renal disease  POST-OPERATIVE DIAGNOSIS:  Same  PROCEDURE:   1) Ultrasound guided right arm AVF access  2) Right arm fistulagram  3) established outpatient evaluation and management - level 3   SURGEON:  Debby SAILOR. Magda, MD  ASSISTANT: none  ANESTHESIA:   local  ESTIMATED BLOOD LOSS: min  LOCAL MEDICATIONS USED:  LIDOCAINE    COUNTS: confirmed correct.  PATIENT DISPOSITION:  PACU - hemodynamically stable.   Delay start of Pharmacological VTE agent (>24hrs) due to surgical blood loss or risk of bleeding: no  INDICATION FOR PROCEDURE: [name] is a 58 y.o. male with ESRD on HD via RUE RC AVF. The fistula has been infiltrated over the past week. After careful discussion of risks, benefits, and alternatives the patient was offered fistulagram. The patient understood and wished to proceed.  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 The patient was positioned supine on the operating room table.  The right upper extremity was prepped and draped in standard fashion. A surgical pause was performed confirming correct patient, procedure, and operative location.  The right upper extremity was anesthetized with subcutaneous injection of 1% lidocaine  over the area of planned access. Using ultrasound guidance, the right upper extremity dialysis access was accessed with micropuncture technique.  Fistulogram was performed in stations with the micro sheath.  See above for details.  All endovascular equipment was removed.  A figure-of-eight stitch was applied to the exit site with good  hemostasis.  Sterile bandage was applied.  Upon completion of the case instrument and sharps counts were confirmed correct. The patient was transferred to the PACU in good condition. I was present for all portions of the procedure.  PLAN: No anatomic problems in fistula besides aneurysmal change. OK to use fistula. Fistula remains amenable to percutaneous intervention. Follow up as needed.  Debby SAILOR. Magda, MD [facility] Phone Number: [phone] 09/13/2024 8:51 AM

## 2024-09-13 NOTE — H&P (Signed)
 [facility] [place]  ASSESSMENT / PLAN: 58 y.o. male with ESRD on HD via RUE RC AVF. The fistula has been infiltrated twice over the past week. After discussion with the patient, we agreed to do a fistulagram today in the cath lab to evaluate for possible anatomic issues.  CHIEF COMPLAINT: ESRD on HD  HISTORY OF PRESENT ILLNESS: [name] is a 58 y.o. male with ESRD on HD via right upper extremity radiocephalic arteriovenous fistula. The fistula was infiltrated at dialysis last week causing some difficulty in completing dialysis treatments. He has no complaints today.   Past Medical History:  Diagnosis Date   Allergy    Diabetes mellitus    Type 2 DM, Patient reports DM Type 1 diagnosd age at age 10   Diabetic foot ulcers (HCC)    ESRD (end stage renal disease) (HCC)    [facility]   Foot callus 07/01/2018   Foreign body in left foot    with  infection   Hypertension    Meniscus tear    Multiple rib fractures    Personal history of diabetic foot ulcer, Left Foot    Type 2 diabetes mellitus with stage 4 chronic kidney disease, without long-term current use of insulin  (HCC) 06/01/2012    Past Surgical History:  Procedure Laterality Date   A/V FISTULAGRAM Right 03/26/2021   Procedure: A/V FISTULAGRAM;  Surgeon: Sheree Penne Bruckner, MD;  Location: [facility] INVASIVE CV LAB;  Service: Cardiovascular;  Laterality: Right;   A/V FISTULAGRAM N/A 04/19/2024   Procedure: A/V Fistulagram;  Surgeon: Lanis Fonda BRAVO, MD;  Location: [facility] PV LAB;  Service: Cardiovascular;  Laterality: N/A;   AV FISTULA PLACEMENT Left 11/06/2020   Procedure: LEFT ARM FIRST STAGE BRACHIAL BASILIC ARTERIOVENOUS (AV) FISTULA CREATION;  Surgeon: Gretta Bruckner PARAS, MD;  Location: [facility] OR;  Service: Vascular;  Laterality: Left;   AV FISTULA PLACEMENT Right 12/05/2020   Procedure: RADIOCEPHALIC ARTERIOVENOUS (AV) FISTULA CREATION RIGHT ARM;  Surgeon: Eliza Bruckner RAMAN, MD;   Location: [facility] OR;  Service: Vascular;  Laterality: Right;   EYE SURGERY Bilateral    lazer   I & D EXTREMITY  04/23/2012   Procedure: IRRIGATION AND DEBRIDEMENT EXTREMITY;  Surgeon: Jerona LULLA Sage, MD;  Location: [facility] OR;  Service: Orthopedics;  Laterality: Left;  Irrigation and Debridement Left Foot   I & D EXTREMITY  09/18/2012   Procedure: IRRIGATION AND DEBRIDEMENT EXTREMITY;  Surgeon: Jerona LULLA Sage, MD;  Location: [facility] OR;  Service: Orthopedics;  Laterality: Left;   IR PERC TUN PERIT CATH WO PORT S&I /IMAG  08/30/2020   IR REMOVAL TUN CV CATH W/O FL  04/17/2021   IR US  GUIDE VASC ACCESS RIGHT  08/30/2020   LIGATION OF ARTERIOVENOUS  FISTULA Left 12/05/2020   Procedure: LIGATION OF ARTERIOVENOUS  FISTULA LEFT ARM;  Surgeon: Eliza Bruckner RAMAN, MD;  Location: [facility] OR;  Service: Vascular;  Laterality: Left;   MENISCUS REPAIR Left 04/2016   Orthopedist, Dr Lilly in [place], [place]    Family History  Problem Relation Age of Onset   Diabetes type II Mother    Arthritis Mother    Diabetes Mother    Hyperlipidemia Mother    Hypertension Mother    Diabetes Father    Diabetes type II Sister    Colon cancer Neg Hx    Stomach cancer Neg Hx    Esophageal cancer Neg Hx    Pancreatic cancer Neg Hx     Social History  Socioeconomic History   Marital status: Married    Spouse name: Not on file   Number of children: Not on file   Years of education: Not on file   Highest education level: Some college, no degree  Occupational History   Not on file  Tobacco Use   Smoking status: Former    Current packs/day: 0.00    Types: Cigarettes    Quit date: 09/03/2012    Years since quitting: 12.0   Smokeless tobacco: Former    Quit date: 09/03/2012  Vaping Use   Vaping status: Never Used  Substance and Sexual Activity   Alcohol use: Not Currently   Drug use: Not Currently   Sexual activity: Yes  Other Topics Concern   Not on file  Social History Narrative   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 Disabled and not working.    Social Drivers of Corporate Investment Banker Strain: Low Risk  (06/10/2024)   Overall Financial Resource Strain (CARDIA)    Difficulty of Paying Living Expenses: Not hard at all  Food Insecurity: No Food Insecurity (06/10/2024)   Hunger Vital Sign    Worried About Running Out of Food in the Last Year: Never true    Ran Out of Food in the Last Year: Never true  Transportation Needs: No Transportation Needs (06/10/2024)   PRAPARE - Administrator, Civil Service (Medical): No    Lack of Transportation (Non-Medical): No  Physical Activity: Insufficiently Active (06/10/2024)   Exercise Vital Sign    Days of Exercise per Week: 3 days    Minutes of Exercise per Session: 20 min  Stress: No Stress Concern Present (06/10/2024)   [employer] of Occupational Health - Occupational Stress Questionnaire    Feeling of Stress: Not at all  Social Connections: Moderately Integrated (06/10/2024)   Social Connection and Isolation Panel    Frequency of Communication with Friends and Family: Three times a week    Frequency of Social Gatherings with Friends and Family: Once a week    Attends Religious Services: More than 4 times per year    Active Member of [employer] or Organizations: No    Attends Engineer, Structural: Not on file    Marital Status: Married  Catering Manager Violence: Not At Risk (06/03/2024)   Humiliation, Afraid, Rape, and Kick questionnaire    Fear of Current or Ex-Partner: No    Emotionally Abused: No    Physically Abused: No    Sexually Abused: No    Allergies  Allergen Reactions   Latex Rash and Other (See Comments)    Pt states he is allergic to latex condoms   Ace Inhibitors Cough   Wound Dressing Adhesive Itching and Rash    Allergy to tape adhesive    No current facility-administered medications for this encounter.    PHYSICAL EXAM Vitals:   09/13/24 0712  BP: (!) 142/64  Pulse: (!) 52  Resp: 16  SpO2:  99%   Chronically ill. No acute distress RUE AVF aneurysmal with thrill  PERTINENT LABORATORY AND RADIOLOGIC DATA  Most recent CBC    Latest Ref Rng & Units 07/22/2024    8:54 AM 06/17/2024    8:50 AM 06/03/2024   12:22 PM  CBC  WBC 4.0 - 10.5 K/uL 4.4  6.0  4.6   Hemoglobin 13.0 - 17.0 g/dL 88.3  89.7  89.5   Hematocrit 39.0 - 52.0 % 33.2  29.4  29.6  Platelets 150 - 400 K/uL 64  80  66      Most recent CMP    Latest Ref Rng & Units 06/03/2024   12:22 PM 02/19/2024   10:25 AM 03/26/2021   11:14 AM  CMP  Glucose 70 - 99 mg/dL 95  893  83   BUN 6 - 20 mg/dL 47  48  96   Creatinine 0.61 - 1.24 mg/dL 2.48  2.95  89.39   Sodium 135 - 145 mmol/L 141  140  142   Potassium 3.5 - 5.1 mmol/L 3.9  4.4  5.0   Chloride 98 - 111 mmol/L 103  101  109   CO2 22 - 32 mmol/L 27  24    Calcium  8.9 - 10.3 mg/dL 8.6  8.6    Total Protein 6.5 - 8.1 g/dL 7.1  7.1    Total Bilirubin 0.0 - 1.2 mg/dL 1.0  1.0    Alkaline Phos 38 - 126 U/L 158  148    AST 15 - 41 U/L 19  19    ALT 0 - 44 U/L 15  19      Renal function CrCl cannot be calculated (Patient's most recent lab result is older than the maximum 21 days allowed.).  Hemoglobin A1C (%)  Date Value  10/18/2021 5.5   HbA1c, POC (controlled diabetic range) (%)  Date Value  06/19/2021 6.0    LDL Chol Calc (NIH)  Date Value Ref Range Status  11/18/2022 39 0 - 99 mg/dL Final    Debby SAILOR. Magda, MD FACS [facility] Phone Number: [phone] 09/13/2024 8:33 AM   Total time spent on preparing this encounter including chart review, data review, collecting history, examining the patient, and coordinating care: 30 min  Portions of this report may have been transcribed using voice recognition software.  Every effort has been made to ensure accuracy; however, inadvertent computerized transcription errors may still be present.

## 2024-09-14 ENCOUNTER — Encounter (HOSPITAL_COMMUNITY): Payer: Self-pay | Admitting: Vascular Surgery

## 2024-09-24 ENCOUNTER — Telehealth: Payer: Self-pay

## 2024-09-24 NOTE — Telephone Encounter (Signed)
 Patients wife calls nurse line requesting an apt.   She reports the patient has a knot on his lower back. She reports it has been there for ~ 3 weeks.   She denies any pain, warmth, redness, fevers or drainage. She reports other than appearance the area is not bothersome at all.   Apt offered for next week, however wife elected for 1/8.  Patient scheduled for 10/14/2024 for evaluation.   Red flags discussed.

## 2024-10-12 ENCOUNTER — Ambulatory Visit: Admitting: Family Medicine

## 2024-10-12 ENCOUNTER — Encounter: Payer: Self-pay | Admitting: Family Medicine

## 2024-10-12 VITALS — BP 150/88 | HR 51 | Ht 71.0 in | Wt 205.0 lb

## 2024-10-12 DIAGNOSIS — Z8639 Personal history of other endocrine, nutritional and metabolic disease: Secondary | ICD-10-CM | POA: Diagnosis not present

## 2024-10-12 DIAGNOSIS — T148XXA Other injury of unspecified body region, initial encounter: Secondary | ICD-10-CM

## 2024-10-12 DIAGNOSIS — D696 Thrombocytopenia, unspecified: Secondary | ICD-10-CM | POA: Diagnosis not present

## 2024-10-12 DIAGNOSIS — I150 Renovascular hypertension: Secondary | ICD-10-CM

## 2024-10-12 DIAGNOSIS — R220 Localized swelling, mass and lump, head: Secondary | ICD-10-CM

## 2024-10-12 DIAGNOSIS — R222 Localized swelling, mass and lump, trunk: Secondary | ICD-10-CM

## 2024-10-12 NOTE — Patient Instructions (Addendum)
 Thank you for visiting clinic today and allowing us  to participate in your care!  We will keep an eye on your bumps. They are most likely cysts. If they grow more or become bothersome, please let us  know.   Call [phone] to make an appointment to see your Hematologist (blood doctor) to follow up on your easy bruising. We are checking some lab work for you today.   The skin changes across your shins is common with venous stasis or reduced blood flow. If you notice any breakdown of your skin like ulcers or bleeding, please let us  know.   Call [phone] to ask to see a different cardiologist.   Reach out any time with any questions or concerns you may have - we are here for you!  Damien Cassis, MD [facility] [phone]

## 2024-10-12 NOTE — Progress Notes (Signed)
" ° ° °  SUBJECTIVE:   CHIEF COMPLAINT / HPI:   Nodules Has noticed bumps on lower back and scalp over the past few weeks Bump on back has gotten smaller Bumps have not been bothersome, no pain, just noticeable Previously had one on chest, also resolved on its own  Bruising  Wife noticed bruising of back of legs yesterday No injury or trauma to area Tender to touch Gets heparin  with dialysis Reports A1c 4.9 at recent dialysis visit  PERTINENT  PMH / PSH: ESRD on HD, Thrombocytopenia, T2DM  OBJECTIVE:   BP (!) 150/88   Pulse (!) 51   Ht 5' 11 (1.803 m)   Wt 205 lb (93 kg)   SpO2 95%   BMI 28.59 kg/m   General: Resting comfortably in room. CV: S1/S2. No extra heart sounds. Warm and well-perfused. Pulm: Breathing comfortably on room air. CTAB. No increased WOB. Skin:  Warm, dry overall. Violaceous bruising of posterior lower extremities around flexor surface of knee, tender to palpation. Chronic venous stasis skin changes of BLE. Soft, mobile, 1 cm ovoid subcutaneous nodule of lower lumbar region, nontender to palpation. 2 small 0.5cm, soft, mobile subcutanesous nodule of scalp around L frontal region, nontender to palpation. Psych: Pleasant and appropriate.     ASSESSMENT/PLAN:   Assessment & Plan Nodule of skin of back Nodule of skin of head Nodules appear most consistent with simple cysts vs lipoma. Reassuringly, nodules have not been bothersome and have been self-resolving. History and exam not consistent with infection or trauma. Discussed continuing to monitor, with plan to return if areas increase or become bothersome.  Bruising Thrombocytopenia Patient with history of thrombocytopenia, previously seen at [facility] Hem[facility]Onc. 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 Patient on aspirin  for CAD and receives heparin  with HD. Suspect patient's easy bruising 2/2 these medications and known thrombocytopenia. Will check CBC w diff, CMP, PT/INR today. Discussed nature of symptoms  and plan for follow up with Hem/Onc as soon as possible.  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 BP around baseline today. Continue routine hemodialysis per Nephrology.    Damien Cassis, MD [facility]  "

## 2024-10-13 ENCOUNTER — Ambulatory Visit: Payer: Self-pay | Admitting: Family Medicine

## 2024-10-13 LAB — COMPREHENSIVE METABOLIC PANEL WITH GFR
ALT: 16 IU/L (ref 0–44)
AST: 18 IU/L (ref 0–40)
Albumin: 4.5 g/dL (ref 3.8–4.9)
Alkaline Phosphatase: 190 IU/L — ABNORMAL HIGH (ref 47–123)
BUN/Creatinine Ratio: 6 — ABNORMAL LOW (ref 9–20)
BUN: 45 mg/dL — ABNORMAL HIGH (ref 6–24)
Bilirubin Total: 1 mg/dL (ref 0.0–1.2)
CO2: 23 mmol/L (ref 20–29)
Calcium: 8.4 mg/dL — ABNORMAL LOW (ref 8.7–10.2)
Chloride: 98 mmol/L (ref 96–106)
Creatinine, Ser: 6.93 mg/dL — ABNORMAL HIGH (ref 0.76–1.27)
Globulin, Total: 2.4 g/dL (ref 1.5–4.5)
Glucose: 78 mg/dL (ref 70–99)
Potassium: 4 mmol/L (ref 3.5–5.2)
Sodium: 138 mmol/L (ref 134–144)
Total Protein: 6.9 g/dL (ref 6.0–8.5)
eGFR: 9 mL/min/1.73 — ABNORMAL LOW

## 2024-10-13 LAB — PROTIME-INR
INR: 1.1 (ref 0.9–1.2)
Prothrombin Time: 12.3 s — ABNORMAL HIGH (ref 9.1–12.0)

## 2024-10-13 LAB — CBC WITH DIFFERENTIAL/PLATELET
Basophils Absolute: 0.1 x10E3/uL (ref 0.0–0.2)
Basos: 2 %
EOS (ABSOLUTE): 1.1 x10E3/uL — ABNORMAL HIGH (ref 0.0–0.4)
Eos: 21 %
Hematocrit: 31.7 % — ABNORMAL LOW (ref 37.5–51.0)
Hemoglobin: 10.6 g/dL — ABNORMAL LOW (ref 13.0–17.7)
Immature Grans (Abs): 0 x10E3/uL (ref 0.0–0.1)
Immature Granulocytes: 0 %
Lymphocytes Absolute: 0.8 x10E3/uL (ref 0.7–3.1)
Lymphs: 16 %
MCH: 32.6 pg (ref 26.6–33.0)
MCHC: 33.4 g/dL (ref 31.5–35.7)
MCV: 98 fL — ABNORMAL HIGH (ref 79–97)
Monocytes Absolute: 0.6 x10E3/uL (ref 0.1–0.9)
Monocytes: 11 %
Neutrophils Absolute: 2.6 x10E3/uL (ref 1.4–7.0)
Neutrophils: 50 %
Platelets: 86 x10E3/uL — CL (ref 150–450)
RBC: 3.25 x10E6/uL — ABNORMAL LOW (ref 4.14–5.80)
RDW: 14.2 % (ref 11.6–15.4)
WBC: 5.2 x10E3/uL (ref 3.4–10.8)

## 2024-10-13 NOTE — Assessment & Plan Note (Addendum)
 Patient with history of thrombocytopenia, previously seen at [facility] Hem[facility]Onc. 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 Patient on aspirin  for CAD and receives heparin  with HD. Suspect patient's easy bruising 2/2 these medications and known thrombocytopenia. Will check CBC w diff, CMP, PT/INR today. Discussed nature of symptoms and plan for follow up with Hem/Onc as soon as possible.

## 2024-10-13 NOTE — Assessment & Plan Note (Signed)
 Patient with difficult to control HTN. BP around baseline today. Continue routine hemodialysis per Nephrology.

## 2024-10-14 ENCOUNTER — Ambulatory Visit

## 2024-10-14 ENCOUNTER — Ambulatory Visit: Payer: Self-pay | Admitting: Family Medicine

## 2024-10-14 VITALS — Ht 71.0 in | Wt 205.0 lb

## 2024-10-14 DIAGNOSIS — Z0000 Encounter for general adult medical examination without abnormal findings: Secondary | ICD-10-CM

## 2024-10-14 NOTE — Patient Instructions (Signed)
 [name],  Thank you for taking the time for your Medicare Wellness Visit. I appreciate your continued commitment to your health goals. Please review the care plan we discussed, and feel free to reach out if I can assist you further.  Please note that Annual Wellness Visits do not include a physical exam. Some assessments may be limited, especially if the visit was conducted virtually. If needed, we may recommend an in-person follow-up with your provider.  Ongoing Care Seeing your primary care provider every 3 to 6 months helps us  monitor your health and provide consistent, personalized care.   Referrals If a referral was made during today's visit and you haven't received any updates within two weeks, please contact the referred provider directly to check on the status.  Recommended Screenings:  Health Maintenance  Topic Date Due   Medicare Annual Wellness Visit  Never done   Complete foot exam   Never done   Hepatitis B Vaccine (1 of 3 - 19+ 3-dose series) Never done   Colon Cancer Screening  Never done   Zoster (Shingles) Vaccine (1 of 2) Never done   Hemoglobin A1C  04/17/2022   Eye exam for diabetics  07/17/2023   COVID-19 Vaccine (2 - 2025-26 season) 06/07/2024   DTaP/Tdap/Td vaccine (3 - Td or Tdap) 11/09/2030   Pneumococcal Vaccine for age over 88  Completed   Flu Shot  Completed   Hepatitis C Screening  Completed   HIV Screening  Completed   HPV Vaccine  Aged Out   Meningitis B Vaccine  Aged Out       10/14/2024    2:18 PM  Advanced Directives  Does Patient Have a Medical Advance Directive? No  Would patient like information on creating a medical advance directive? No - Patient declined    Vision: Annual vision screenings are recommended for early detection of glaucoma, cataracts, and diabetic retinopathy. These exams can also reveal signs of chronic conditions such as diabetes and high blood pressure.  Dental: Annual dental screenings help detect early signs of oral  cancer, gum disease, and other conditions linked to overall health, including heart disease and diabetes.  Please see the attached documents for additional preventive care recommendations.

## 2024-10-14 NOTE — Progress Notes (Signed)
 "  Chief Complaint  Patient presents with   Medicare Wellness    INITIAL     Subjective:   [name] is a 59 y.o. male who presents for a Medicare Annual Wellness Visit.  Visit info / Clinical Intake: Medicare Wellness Visit Type:: Initial Annual Wellness Visit Persons participating in visit and providing information:: patient Medicare Wellness Visit Mode:: Telephone If telephone:: video error Since this visit was completed virtually, some vitals may be partially provided or unavailable. Missing vitals are due to the limitations of the virtual format.: Documented vitals are patient reported If Telephone or Video please confirm:: I connected with patient using audio/video enable telemedicine. I verified patient identity with two identifiers, discussed telehealth limitations, and patient agreed to proceed. Patient Location:: Home Provider Location:: Home Office Interpreter Needed?: No Pre-visit prep was completed: yes AWV questionnaire completed by patient prior to visit?: no Living arrangements:: lives with spouse/significant other Patient's Overall Health Status Rating: (!) fair Typical amount of pain: none Does pain affect daily life?: no Are you currently prescribed opioids?: no  Dietary Habits and Nutritional Risks How many meals a day?: 2 Eats fruit and vegetables daily?: (!) no (weight/water restrictions) Most meals are obtained by: preparing own meals In the last 2 weeks, have you had any of the following?: none Diabetic:: (!) yes Any non-healing wounds?: no How often do you check your BS?: as needed Would you like to be referred to a Nutritionist or for Diabetic Management? : no  Functional Status Activities of Daily Living (to include ambulation/medication): Independent Ambulation: Independent Medication Administration: Independent Home Management (perform basic housework or laundry): Independent Manage your own finances?: yes Primary transportation is:  driving Concerns about vision?: no *vision screening is required for WTM* Concerns about hearing?: no  Fall Screening Falls in the past year?: 0 Number of falls in past year: 0 Was there an injury with Fall?: 0 Fall Risk Category Calculator: 0 Patient Fall Risk Level: Low Fall Risk  Fall Risk Patient at Risk for Falls Due to: No Fall Risks Fall risk Follow up: Falls evaluation completed; Education provided  Home and Transportation Safety: All rugs have non-skid backing?: yes All stairs or steps have railings?: yes Grab bars in the bathtub or shower?: yes Have non-skid surface in bathtub or shower?: yes Good home lighting?: yes Regular seat belt use?: yes Hospital stays in the last year:: no  Cognitive Assessment Difficulty concentrating, remembering, or making decisions? : no Will 6CIT or Mini Cog be Completed: yes What year is it?: 0 points What month is it?: 0 points Give patient an address phrase to remember (5 components): [address] [place] About what time is it?: 0 points Count backwards from 20 to 1: 0 points Say the months of the year in reverse: 0 points Repeat the address phrase from earlier: 0 points 6 CIT Score: 0 points  Advance Directives (For Healthcare) Does Patient Have a Medical Advance Directive?: No Would patient like information on creating a medical advance directive?: No - Patient declined  Reviewed/Updated  Reviewed/Updated: Reviewed All (Medical, Surgical, Family, Medications, Allergies, Care Teams, Patient Goals)    Allergies (verified) Latex, Ace inhibitors, and Wound dressing adhesive   Current Medications (verified) Outpatient Encounter Medications as of 10/14/2024  Medication Sig   acetaminophen  (TYLENOL ) 325 MG tablet Take 650 mg by mouth every 6 (six) hours as needed for mild pain or headache.    amLODipine  (NORVASC ) 10 MG tablet Take 10 mg by mouth daily.  aspirin  EC 81 MG tablet Take 81 mg by mouth daily. Swallow whole.    benzonatate  (TESSALON ) 100 MG capsule Take 1 capsule (100 mg total) by mouth every 8 (eight) hours.   calcium  acetate (PHOSLO) 667 MG capsule Take 2,001 mg by mouth 3 (three) times daily.   calcium  carbonate (TUMS - DOSED IN MG ELEMENTAL CALCIUM ) 500 MG chewable tablet Chew 1 tablet by mouth daily.   carvedilol  (COREG ) 25 MG tablet Take 25 mg by mouth 2 (two) times daily with a meal.   cefadroxil  (DURICEF) 500 MG capsule Take 2 tablets (1000 mg total) on first day. Then take 1 tablet after dialysis session for next 2 dialysis sessions.   doxazosin (CARDURA) 2 MG tablet Take 2 mg by mouth daily.   doxycycline  (VIBRAMYCIN ) 100 MG capsule Take 1 capsule (100 mg total) by mouth 2 (two) times daily.   hydrALAZINE  (APRESOLINE ) 25 MG tablet Take 25 mg by mouth 3 (three) times daily.   irbesartan (AVAPRO) 300 MG tablet Take 300 mg by mouth daily.   lidocaine -prilocaine (EMLA) cream Apply 1 application. topically daily as needed (prior to port being accessed for dialysis).   rosuvastatin  (CRESTOR ) 10 MG tablet Take 10 mg by mouth at bedtime.   torsemide (DEMADEX) 20 MG tablet Take 20 mg by mouth daily.   No facility-administered encounter medications on file as of 10/14/2024.    History: Past Medical History:  Diagnosis Date   Allergy    Diabetes mellitus    Type 2 DM, Patient reports DM Type 1 diagnosd age at age 48   Diabetic foot ulcers (HCC)    ESRD (end stage renal disease) (HCC)    [facility]   Foot callus 07/01/2018   Foreign body in left foot    with  infection   Hypertension    Meniscus tear    Multiple rib fractures    Personal history of diabetic foot ulcer, Left Foot    Type 2 diabetes mellitus with stage 4 chronic kidney disease, without long-term current use of insulin  (HCC) 06/01/2012   Past Surgical History:  Procedure Laterality Date   A/V FISTULAGRAM Right 03/26/2021   Procedure: A/V FISTULAGRAM;  Surgeon: Sheree Penne Bruckner, MD;  Location: [facility] INVASIVE  CV LAB;  Service: Cardiovascular;  Laterality: Right;   A/V FISTULAGRAM N/A 04/19/2024   Procedure: A/V Fistulagram;  Surgeon: Lanis Fonda BRAVO, MD;  Location: [facility] PV LAB;  Service: Cardiovascular;  Laterality: N/A;   A/V FISTULAGRAM Right 09/13/2024   Procedure: A/V Fistulagram;  Surgeon: Magda Debby SAILOR, MD;  Location: [facility] PV LAB;  Service: Cardiovascular;  Laterality: Right;   AV FISTULA PLACEMENT Left 11/06/2020   Procedure: LEFT ARM FIRST STAGE BRACHIAL BASILIC ARTERIOVENOUS (AV) FISTULA CREATION;  Surgeon: Gretta Bruckner PARAS, MD;  Location: [facility] OR;  Service: Vascular;  Laterality: Left;   AV FISTULA PLACEMENT Right 12/05/2020   Procedure: RADIOCEPHALIC ARTERIOVENOUS (AV) FISTULA CREATION RIGHT ARM;  Surgeon: Eliza Bruckner RAMAN, MD;  Location: [facility] OR;  Service: Vascular;  Laterality: Right;   EYE SURGERY Bilateral    lazer   I & D EXTREMITY  04/23/2012   Procedure: IRRIGATION AND DEBRIDEMENT EXTREMITY;  Surgeon: Jerona LULLA Sage, MD;  Location: [facility] OR;  Service: Orthopedics;  Laterality: Left;  Irrigation and Debridement Left Foot   I & D EXTREMITY  09/18/2012   Procedure: IRRIGATION AND DEBRIDEMENT EXTREMITY;  Surgeon: Jerona LULLA Sage, MD;  Location: [facility] OR;  Service: Orthopedics;  Laterality: Left;   IR PERC  TUN PERIT CATH WO PORT S&I /IMAG  08/30/2020   IR REMOVAL TUN CV CATH W/O FL  04/17/2021   IR US  GUIDE VASC ACCESS RIGHT  08/30/2020   LIGATION OF ARTERIOVENOUS  FISTULA Left 12/05/2020   Procedure: LIGATION OF ARTERIOVENOUS  FISTULA LEFT ARM;  Surgeon: Eliza Lonni RAMAN, MD;  Location: [facility] OR;  Service: Vascular;  Laterality: Left;   MENISCUS REPAIR Left 04/2016   Orthopedist, Dr Lilly in [place], [place]   Family History  Problem Relation Age of Onset   Diabetes type II Mother    Arthritis Mother    Diabetes Mother    Hyperlipidemia Mother    Hypertension Mother    Diabetes Father    Diabetes type II Sister    Colon cancer Neg Hx    Stomach cancer Neg Hx    Esophageal cancer  Neg Hx    Pancreatic cancer Neg Hx    Social History   Occupational History   Not on file  Tobacco Use   Smoking status: Former    Current packs/day: 0.00    Types: Cigarettes    Quit date: 09/03/2012    Years since quitting: 12.1   Smokeless tobacco: Former    Quit date: 09/03/2012  Vaping Use   Vaping status: Never Used  Substance and Sexual Activity   Alcohol use: Not Currently   Drug use: Not Currently   Sexual activity: Yes   Tobacco Counseling Counseling given: Not Answered  SDOH Screenings   Food Insecurity: No Food Insecurity (10/14/2024)  Housing: Low Risk (10/14/2024)  Transportation Needs: No Transportation Needs (10/14/2024)  Utilities: Not At Risk (10/14/2024)  Alcohol Screen: Low Risk (10/14/2024)  Depression (PHQ2-9): Low Risk (10/14/2024)  Financial Resource Strain: Low Risk (10/14/2024)  Physical Activity: Insufficiently Active (10/14/2024)  Social Connections: Moderately Integrated (10/14/2024)  Stress: No Stress Concern Present (10/14/2024)  Tobacco Use: Medium Risk (10/14/2024)  Health Literacy: Adequate Health Literacy (10/14/2024)   See flowsheets for full screening details  Depression Screen PHQ 2 & 9 Depression Scale- Over the past 2 weeks, how often have you been bothered by any of the following problems? Little interest or pleasure in doing things: 0 Feeling down, depressed, or hopeless (PHQ Adolescent also includes...irritable): 0 PHQ-2 Total Score: 0 Trouble falling or staying asleep, or sleeping too much: 0 Feeling tired or having little energy: 1 Poor appetite or overeating (PHQ Adolescent also includes...weight loss): 0 Feeling bad about yourself - or that you are a failure or have let yourself or your family down: 0 Trouble concentrating on things, such as reading the newspaper or watching television (PHQ Adolescent also includes...like school work): 0 Moving or speaking so slowly that other people could have noticed. 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               Objective:    Today's Vitals   10/14/24 1415  Weight: 205 lb (93 kg)  Height: 5' 11 (1.803 m)  PainSc: 0-No pain   Body mass  index is 28.59 kg/m.  Hearing/Vision screen Hearing Screening - Comments:: Left ear decreased hearing. Patient has an appointment with ENT scheduled. Vision Screening - Comments:: Wears reading glasses, cataracts removed.  Eye exam done by [facility]. Immunizations and Health Maintenance Health Maintenance  Topic Date Due   FOOT EXAM  Never done   Hepatitis B Vaccines 19-59 Average Risk (1 of 3 - 19+ 3-dose series) Never done   Colonoscopy  Never done   Zoster Vaccines- Shingrix (1 of 2) Never done   HEMOGLOBIN A1C  04/17/2022   OPHTHALMOLOGY EXAM  07/17/2023   COVID-19 Vaccine (2 - 2025-26 season) 06/07/2024   Medicare Annual Wellness (AWV)  10/14/2025   DTaP/Tdap/Td (3 - Td or Tdap) 11/09/2030   Pneumococcal Vaccine: 50+ Years  Completed   Influenza Vaccine  Completed   Hepatitis C Screening  Completed   HIV Screening  Completed   HPV VACCINES  Aged Out   Meningococcal B Vaccine  Aged Out        Assessment/Plan:  This is a routine wellness examination for [name].  Patient Care Team: Diona Perkins, MD as PCP - General Kate Lonni CROME, MD as PCP - Cardiology (Cardiology) Dialysis, [facility] Dennise Capri, MD as Consulting Physician (Nephrology) [facility] as Consulting Physician (Retina Ophthalmology) Spainhour, Alm RAMAN, PA as Physician Assistant (Otolaryngology) Tobie Franky SQUIBB, DPM as Consulting  Physician (Podiatry)  I have personally reviewed and noted the following in the patients chart:   Medical and social history Use of alcohol, tobacco or illicit drugs  Current medications and supplements including opioid prescriptions. Functional ability and status Nutritional status Physical activity Advanced directives List of other physicians Hospitalizations, surgeries, and ER visits in previous 12 months Vitals Screenings to include cognitive, depression, and falls Referrals and appointments  No orders of the defined types were placed in this encounter.  In addition, I have reviewed and discussed with patient certain preventive protocols, quality metrics, and best practice recommendations. A written personalized care plan for preventive services as well as general preventive health recommendations were provided to patient.   Earl LOISE Fuller, LPN   05/07/7972   Return in about 1 year (around 10/14/2025) for Medicare wellness.  After Visit Summary: (MyChart) Due to this being a telephonic visit, the after visit summary with patients personalized plan was offered to patient via MyChart   Nurse Notes:  No voiced or noted concerns at this time HM Addressed: Vaccines Due: Shingrix, Covid-19, Hep B Series Diabetic Foot Exam recommended Labs Due HgA1C Diabetic Eye Exam, Colonoscopy recommended.  "

## 2024-10-19 ENCOUNTER — Ambulatory Visit

## 2024-10-19 ENCOUNTER — Encounter: Payer: Self-pay | Admitting: Emergency Medicine

## 2024-10-19 ENCOUNTER — Ambulatory Visit: Attending: Emergency Medicine | Admitting: Emergency Medicine

## 2024-10-19 VITALS — BP 126/68 | HR 52 | Ht 71.5 in | Wt 202.0 lb

## 2024-10-19 DIAGNOSIS — N185 Chronic kidney disease, stage 5: Secondary | ICD-10-CM | POA: Diagnosis present

## 2024-10-19 DIAGNOSIS — I2510 Atherosclerotic heart disease of native coronary artery without angina pectoris: Secondary | ICD-10-CM | POA: Insufficient documentation

## 2024-10-19 DIAGNOSIS — R002 Palpitations: Secondary | ICD-10-CM | POA: Insufficient documentation

## 2024-10-19 DIAGNOSIS — E785 Hyperlipidemia, unspecified: Secondary | ICD-10-CM | POA: Insufficient documentation

## 2024-10-19 DIAGNOSIS — I10 Essential (primary) hypertension: Secondary | ICD-10-CM | POA: Diagnosis not present

## 2024-10-19 LAB — LIPID PANEL
Chol/HDL Ratio: 2 ratio (ref 0.0–5.0)
Cholesterol, Total: 116 mg/dL (ref 100–199)
HDL: 59 mg/dL
LDL Chol Calc (NIH): 44 mg/dL (ref 0–99)
Triglycerides: 60 mg/dL (ref 0–149)
VLDL Cholesterol Cal: 13 mg/dL (ref 5–40)

## 2024-10-19 LAB — HEPATIC FUNCTION PANEL
ALT: 15 IU/L (ref 0–44)
AST: 19 IU/L (ref 0–40)
Albumin: 4.3 g/dL (ref 3.8–4.9)
Alkaline Phosphatase: 219 IU/L — ABNORMAL HIGH (ref 47–123)
Bilirubin Total: 0.9 mg/dL (ref 0.0–1.2)
Bilirubin, Direct: 0.43 mg/dL — ABNORMAL HIGH (ref 0.00–0.40)
Total Protein: 7.1 g/dL (ref 6.0–8.5)

## 2024-10-19 NOTE — Progress Notes (Unsigned)
Enrolled patient for a 14 day Zio XT monitor to be mailed to patients home  [name] to read

## 2024-10-19 NOTE — Progress Notes (Signed)
 " Cardiology Office Note:    Date:  10/19/2024  ID:  [name], DOB [birth year], MRN [id number] PCP: Diona Perkins, MD  [facility] Providers Cardiologist:  Lonni LITTIE Nanas, MD Cardiology APP:  Rana Lum LITTIE, NP       Patient Profile:       Chief Complaint: 81-month follow-up History of Present Illness:  [name] is a 59 y.o. male with visit-pertinent history of coronary artery disease, ESRD, T2DM, hypertension  He was referred for evaluation of irregular heart rhythm and was initially seen on 11/2022.  Reports were told at dialysis heart rhythm was irregular.  He had a positive stress test as part of a transplant evaluation underwent cardiac catheterization on 05/2022 which showed 40% mid LAD stenosis, 70% ostial D1, 99% D2 (small diffusely disease), diffusely diseased apical LAD a very small, 50% mid LCx, 100% OM1 with left to left collaterals, 100% OM2 with left to left collaterals, 30% RCA, 60% RPL.  Echocardiogram 12/2022 showed LVEF 60 to 65%, severe LVH, grade 2 DD, normal RV, severe left atrial enlargement, moderate right atrial enlargement, no significant valvular disease, dilated aortic root measuring at 43 mm.  He was recommended to have a Zio patch after clinic visit on 11/2022 but he had allergic reaction to adhesive and was unable to tolerate.  Last seen in clinic on 02/27/2023.  It was again discussed for Zio patch but was never completed.  He was doing well without anginal symptoms.  Blood pressure was well-controlled.  No interventions were warranted.  He was to follow-up in 6 months   Discussed the use of AI scribe software for clinical note transcription with the patient, who gave verbal consent to proceed.  History of Present Illness [name] is a 59 year old male with coronary artery disease and end-stage renal disease on dialysis who presents for 22-month follow-up.    Today patient is doing well without acute cardiovascular  concerns or complaints.  He denies any chest pains or exertional symptoms.  No orthopnea, PND, or lower extremity slowing.  Does remain adherent to his current dialysis treatments.  Denies any episodes of hypotension during dialysis.  Usually takes his irbesartan the morning of dialysis and the rest of his medications after.  He denies any palpitations or episodes of fast heart rates.  He is no longer drinking or smoking. 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   Review of systems:  Please see the history of present illness. All other systems are reviewed and otherwise negative.      Studies Reviewed:    EKG Interpretation Date/Time:  Tuesday October 19 2024 15:36:35 EST Ventricular Rate:  52 PR Interval:  196 QRS Duration:  90 QT Interval:  482 QTC Calculation: 448 R Axis:   -65  Text Interpretation: Sinus bradycardia Left axis deviation Inferior infarct , age undetermined Anterolateral infarct , age undetermined When compared with ECG of 28-Aug-2020 00:52, PREVIOUS ECG IS PRESENT Confirmed by Rana Lum [phone]) on 10/19/2024 4:15:29 PM   Echocardiogram 12/12/2022 1. Left ventricular ejection fraction, by estimation, is 60 to 65%. Left  ventricular ejection fraction by PLAX is 60 %. The left ventricle has  normal function. The left ventricle has no regional wall motion  abnormalities. There is severe concentric left   ventricular hypertrophy. Left ventricular diastolic parameters are  consistent with Grade II diastolic dysfunction (pseudonormalization).  Elevated left ventricular end-diastolic pressure. The E/e' is 21.  2. Right ventricular systolic function is normal. The right ventricular  size is mildly enlarged.   3. Left atrial size was severely dilated.   4. Right atrial size was moderately dilated.   5. The mitral valve is grossly normal. Trivial mitral valve  regurgitation.   6. The aortic valve is tricuspid.  Aortic valve regurgitation is not  visualized.   7. Aortic dilatation noted. There is mild dilatation of the aortic root,  measuring 43 mm.   8. The inferior vena cava is normal in size with greater than 50%  respiratory variability, suggesting right atrial pressure of 3 mmHg.   Risk Assessment/Calculations:              Physical Exam:   VS:  BP 126/68 (BP Location: Right Arm, Patient Position: Sitting, Cuff Size: Normal)   Pulse (!) 52   Ht 5' 11.5 (1.816 m)   Wt 202 lb (91.6 kg)   BMI 27.78 kg/m    Wt Readings from Last 3 Encounters:  10/19/24 202 lb (91.6 kg)  10/14/24 205 lb (93 kg)  10/12/24 205 lb (93 kg)    GEN: Well nourished, well developed in no acute distress NECK: No JVD; No carotid bruits CARDIAC: RRR, no murmurs, rubs, gallops RESPIRATORY:  Clear to auscultation without rales, wheezing or rhonchi  ABDOMEN: Soft, non-tender, non-distended EXTREMITIES:  No edema; No acute deformity      Assessment and Plan:  Irregular heart rhythm Zio patch was recommended on 11/2022 to evaluate for arrhythmia but he had allergic reaction to adhesive and was unable to tolerate - Today he is without palpitations or episodes of fast heart rates - EKG today is without arrhythmia and he is maintaining a sinus rhythm - Plan for ZIO monitoring to evaluate for abnormal heart rhythm with hypoallergenic patch  Coronary artery disease Stress test 01/2022 showed ischemic anterior and lateral walls Most recent cath showed severe multi branch vessel CAD (D1, OM1, OM2).  Culprit was likely D1 based on positive anterolateral stress test however patient is completely asymptomatic therefore no indication for PCI at the time Echo 12/2022 with LVEF 60 to 65%, severe LVH, grade 2 DD - Continue medical therapy optimization for CAD - Today he is stable without chest discomfort.  He participates in moderate aerobic activity without exertional symptoms.  No indication for further ischemic evaluation at  this time - Continue aspirin  81 mg daily, rosuvastatin  10 mg daily, carvedilol  25 mg twice daily, and amlodipine  10 mg daily  Hypertension Blood pressure today is well-controlled at 126/68 - Continue amlodipine  10 mg daily, carvedilol  25 mg twice daily, hydralazine  25 mg 3 times daily, and irbesartan 300 mg daily  Hyperlipidemia, LDL goal <70 LDL 39 on 11/2022 - Plan for repeat fasting lipid panel today  - Continue rosuvastatin  20 mg daily  ESRD - On hemodialysis      Dispo:  Return in about 6 months (around 04/18/2025).  Signed, Lum LITTIE Louis, NP  "

## 2024-10-19 NOTE — Patient Instructions (Addendum)
 Medication Instructions:  NO CHANGES  Lab Work: FASTING LIPID PANEL AND LFTs TO BE DONE TODAY.  Testing/Procedures: GEOFFRY HEWS- Long Term Monitor Instructions  Your physician has requested you wear a ZIO patch monitor for 14 days.  This is a single patch monitor. [employer] supplies one patch monitor per enrollment. Additional stickers are not available. Please do not apply patch if you will be having a Nuclear Stress Test,  Echocardiogram, Cardiac CT, MRI, or Chest Xray during the period you would be wearing the  monitor. The patch cannot be worn during these tests. You cannot remove and re-apply the  ZIO XT patch monitor.  Your ZIO patch monitor will be mailed 3 day USPS to your address on file. It may take 3-5 days  to receive your monitor after you have been enrolled.  Once you have received your monitor, please review the enclosed instructions. Your monitor  has already been registered assigning a specific monitor serial # to you.  Billing and Patient Assistance Program Information  We have supplied [employer] with any of your insurance information on file for billing purposes. [employer] offers a sliding scale Patient Assistance Program for patients that do not have  insurance, or whose insurance does not completely cover the cost of the ZIO monitor.  You must apply for the Patient Assistance Program to qualify for this discounted rate.  To apply, please call [employer] at [phone], select option 4, select option 2, ask to apply for  Patient Assistance Program. Meredeth will ask your household income, and how many people  are in your household. They will quote your out-of-pocket cost based on that information.  [employer] will also be able to set up a 100-month, interest-free payment plan if needed.  Applying the monitor   Shave hair from upper left chest.  Hold abrader disc by orange tab. Rub abrader in 40 strokes over the upper left chest as  indicated in your monitor instructions.  Clean  area with 4 enclosed alcohol pads. Let dry.  Apply patch as indicated in monitor instructions. Patch will be placed under collarbone on left  side of chest with arrow pointing upward.  Rub patch adhesive wings for 2 minutes. Remove white label marked 1. Remove the white  label marked 2. Rub patch adhesive wings for 2 additional minutes.  While looking in a mirror, press and release button in center of patch. A small green light will  flash 3-4 times. This will be your only indicator that the monitor has been turned on.  Do not shower for the first 24 hours. You may shower after the first 24 hours.  Press the button if you feel a symptom. You will hear a small click. Record Date, Time and  Symptom in the Patient Logbook.  When you are ready to remove the patch, follow instructions on the last 2 pages of Patient  Logbook. Stick patch monitor onto the last page of Patient Logbook.  Place Patient Logbook in the blue and white box. Use locking tab on box and tape box closed  securely. The blue and white box has prepaid postage on it. Please place it in the mailbox as  soon as possible. Your physician should have your test results approximately 7 days after the  monitor has been mailed back to [facility].  Call [facility] Customer Care at [phone] if you have questions regarding  your ZIO XT patch monitor. Call them immediately if you see an orange light blinking on your  monitor.  If your monitor falls off in less than 4 days, contact our Monitor department at [phone].  If your monitor becomes loose or falls off after 4 days call [employer] at [phone] for  suggestions on securing your monitor   Follow-Up: At [facility], you and your health needs are our priority.  As part of our continuing mission to provide you with exceptional heart care, our providers are all part of one team.  This team includes your primary Cardiologist (physician) and Advanced Practice  Providers or APPs (Physician Assistants and Nurse Practitioners) who all work together to provide you with the care you need, when you need it.  Your next appointment:   6 MONTHS  Provider:   Lonni LITTIE Nanas, MD    Other Instructions:

## 2024-10-20 ENCOUNTER — Ambulatory Visit: Payer: Self-pay | Admitting: Emergency Medicine

## 2024-11-04 ENCOUNTER — Encounter (HOSPITAL_COMMUNITY): Payer: Self-pay

## 2024-11-04 ENCOUNTER — Ambulatory Visit (HOSPITAL_COMMUNITY)
Admission: RE | Admit: 2024-11-04 | Discharge: 2024-11-04 | Disposition: A | Discharge status: Home / Self Care | Source: Ambulatory Visit | Attending: Student | Admitting: Student

## 2024-11-04 ENCOUNTER — Other Ambulatory Visit: Payer: Self-pay

## 2024-11-04 VITALS — BP 184/71 | HR 50 | Temp 97.9°F | Resp 20

## 2024-11-04 DIAGNOSIS — H9201 Otalgia, right ear: Secondary | ICD-10-CM

## 2024-11-04 DIAGNOSIS — N186 End stage renal disease: Secondary | ICD-10-CM

## 2024-11-04 DIAGNOSIS — J069 Acute upper respiratory infection, unspecified: Secondary | ICD-10-CM

## 2024-11-04 MED ORDER — AZELASTINE HCL 0.1 % NA SOLN: 2.0000 | Freq: Two times a day (BID) | NASAL | 2 refills | Status: AC

## 2024-11-04 MED ORDER — PREDNISONE 10 MG PO TABS: 20.0000 mg | ORAL_TABLET | Freq: Every day | ORAL | 0 refills | Status: AC

## 2024-11-04 NOTE — ED Provider Notes (Signed)
 " [facility]    CSN: [id number] Arrival date & time: 11/04/24  0810      History   Chief Complaint Chief Complaint  Patient presents with   Otalgia   Cough    HPI [name] is a 59 y.o. male presenting w R ear pain and cough.  Medical history ESRD. Describes R ear pain x2-3 days. Pain radiates into R neck. Denies hearing changes.  Also endorses cough. Nonproductive.  Sore throat and PND. The patient denies a history of pulmonary disease ESRD on dialysis Monday/Wednesday/Friday.  HPI  Past Medical History:  Diagnosis Date   Allergy    Diabetes mellitus    Type 2 DM, Patient reports DM Type 1 diagnosd age at age 31   Diabetic foot ulcers (HCC)    ESRD (end stage renal disease) (HCC)    [facility]   Foot callus 07/01/2018   Foreign body in left foot    with  infection   Hypertension    Meniscus tear    Multiple rib fractures    Personal history of diabetic foot ulcer, Left Foot    Type 2 diabetes mellitus with stage 4 chronic kidney disease, without long-term current use of insulin  (HCC) 06/01/2012    Patient Active Problem List   Diagnosis Date Noted   Thrombocytopenia 06/03/2024   Anemia in other chronic diseases classified elsewhere 06/03/2024   CAD (coronary artery disease) 04/29/2024   Positive cardiac stress test 05/02/2022   Trigger finger, left middle finger 01/01/2022   ESRD (end stage renal disease) on dialysis (HCC) 01/10/2021   Hyperlipidemia 05/28/2016   Hypertension 04/14/2012   History of diabetes mellitus 04/13/2012    Past Surgical History:  Procedure Laterality Date   A/V FISTULAGRAM Right 03/26/2021   Procedure: A/V FISTULAGRAM;  Surgeon: Sheree Penne Bruckner, MD;  Location: [facility] INVASIVE CV LAB;  Service: Cardiovascular;  Laterality: Right;   A/V FISTULAGRAM N/A 04/19/2024   Procedure: A/V Fistulagram;  Surgeon: Lanis Fonda BRAVO, MD;  Location: [facility] PV LAB;  Service: Cardiovascular;  Laterality: N/A;   A/V  FISTULAGRAM Right 09/13/2024   Procedure: A/V Fistulagram;  Surgeon: Magda Debby SAILOR, MD;  Location: [facility];  Service: Cardiovascular;  Laterality: Right;   AV FISTULA PLACEMENT Left 11/06/2020   Procedure: LEFT ARM FIRST STAGE BRACHIAL BASILIC ARTERIOVENOUS (AV) FISTULA CREATION;  Surgeon: Gretta Bruckner PARAS, MD;  Location: [facility] OR;  Service: Vascular;  Laterality: Left;   AV FISTULA PLACEMENT Right 12/05/2020   Procedure: RADIOCEPHALIC ARTERIOVENOUS (AV) FISTULA CREATION RIGHT ARM;  Surgeon: Eliza Bruckner RAMAN, MD;  Location: [facility] OR;  Service: Vascular;  Laterality: Right;   EYE SURGERY Bilateral    lazer   I & D EXTREMITY  04/23/2012   Procedure: IRRIGATION AND DEBRIDEMENT EXTREMITY;  Surgeon: Jerona LULLA Sage, MD;  Location: [facility] OR;  Service: Orthopedics;  Laterality: Left;  Irrigation and Debridement Left Foot   I & D EXTREMITY  09/18/2012   Procedure: IRRIGATION AND DEBRIDEMENT EXTREMITY;  Surgeon: Jerona LULLA Sage, MD;  Location: [facility] OR;  Service: Orthopedics;  Laterality: Left;   IR PERC TUN PERIT CATH WO PORT S&I /IMAG  08/30/2020   IR REMOVAL TUN CV CATH W/O FL  04/17/2021   IR US  GUIDE VASC ACCESS RIGHT  08/30/2020   LIGATION OF ARTERIOVENOUS  FISTULA Left 12/05/2020   Procedure: LIGATION OF ARTERIOVENOUS  FISTULA LEFT ARM;  Surgeon: Eliza Bruckner RAMAN, MD;  Location: [facility] OR;  Service: Vascular;  Laterality: Left;  MENISCUS REPAIR Left 04/2016   Orthopedist, Dr Daldorf in [place], [place]       Home Medications    Prior to Admission medications  Medication Sig Start Date End Date Taking? Authorizing Provider  acetaminophen  (TYLENOL ) 325 MG tablet Take 650 mg by mouth every 6 (six) hours as needed for mild pain or headache.    Yes [provider]  amLODipine  (NORVASC ) 10 MG tablet Take 10 mg by mouth daily. 11/18/18  Yes [provider]  aspirin  EC 81 MG tablet Take 81 mg by mouth daily. Swallow whole.   Yes [provider]  azelastine  (ASTELIN ) 0.1 %  nasal spray Place 2 sprays into both nostrils 2 (two) times daily. Use in each nostril as directed 11/04/24  Yes Arlyss Leita BRAVO, PA-C  calcium  carbonate (TUMS - DOSED IN MG ELEMENTAL CALCIUM ) 500 MG chewable tablet Chew 1 tablet by mouth daily.   Yes [provider]  carvedilol  (COREG ) 25 MG tablet Take 25 mg by mouth 2 (two) times daily with a meal. 09/29/21  Yes [provider]  doxazosin (CARDURA) 2 MG tablet Take 2 mg by mouth daily.   Yes [provider]  irbesartan (AVAPRO) 300 MG tablet Take 300 mg by mouth daily.   Yes [provider]  predniSONE  (DELTASONE ) 10 MG tablet Take 2 tablets (20 mg total) by mouth daily for 5 days. 11/04/24 11/09/24 Yes Takeem Krotzer E, PA-C  rosuvastatin  (CRESTOR ) 10 MG tablet Take 10 mg by mouth at bedtime. 06/16/24  Yes [provider]  calcium  acetate (PHOSLO) 667 MG capsule Take 2,001 mg by mouth 3 (three) times daily. 11/28/20   [provider]  hydrALAZINE  (APRESOLINE ) 25 MG tablet Take 25 mg by mouth 3 (three) times daily.    [provider]  lidocaine -prilocaine (EMLA) cream Apply 1 application. topically daily as needed (prior to port being accessed for dialysis). 02/26/21   [provider]  torsemide (DEMADEX) 20 MG tablet Take 20 mg by mouth daily. 05/14/24   [provider]    Family History Family History  Problem Relation Age of Onset   Diabetes type II Mother    Arthritis Mother    Diabetes Mother    Hyperlipidemia Mother    Hypertension Mother    Diabetes Father    Diabetes type II Sister    Colon cancer Neg Hx    Stomach cancer Neg Hx    Esophageal cancer Neg Hx    Pancreatic cancer Neg Hx     Social History Social History[1]   Allergies   Latex, Ace inhibitors, and Wound dressing adhesive   Review of Systems Review of Systems  Constitutional:  Negative for appetite change, chills and fever.  HENT:  Positive for congestion, ear pain and sore throat.  Negative for rhinorrhea, sinus pressure and sinus pain.   Eyes:  Negative for redness and visual disturbance.  Respiratory:  Positive for cough. Negative for chest tightness, shortness of breath and wheezing.   Cardiovascular:  Negative for chest pain and palpitations.  Gastrointestinal:  Negative for abdominal pain, constipation, diarrhea, nausea and vomiting.  Genitourinary:  Negative for dysuria, frequency and urgency.  Musculoskeletal:  Negative for myalgias.  Neurological:  Negative for dizziness, weakness and headaches.  Psychiatric/Behavioral:  Negative for confusion.   All other systems reviewed and are negative.    Physical Exam Triage Vital Signs ED Triage Vitals  Encounter Vitals Group     BP 11/04/24 0859 (!) 184/71  Girls Systolic BP Percentile --      Girls Diastolic BP Percentile --      Boys Systolic BP Percentile --      Boys Diastolic BP Percentile --      Pulse Rate 11/04/24 0859 (!) 50     Resp 11/04/24 0859 20     Temp 11/04/24 0859 97.9 F (36.6 C)     Temp src --      SpO2 11/04/24 0859 95 %     Weight --      Height --      Head Circumference --      Peak Flow --      Pain Score 11/04/24 0856 6     Pain Loc --      Pain Education --      Exclude from Growth Chart --    No data found.  Updated Vital Signs BP (!) 184/71   Pulse (!) 50   Temp 97.9 F (36.6 C)   Resp 20   SpO2 95%   Visual Acuity Right Eye Distance:   Left Eye Distance:   Bilateral Distance:    Right Eye Near:   Left Eye Near:    Bilateral Near:     Physical Exam Vitals reviewed.  Constitutional:      General: He is not in acute distress.    Appearance: Normal appearance. He is not ill-appearing.  HENT:     Head: Normocephalic and atraumatic.     Right Ear: Tympanic membrane, ear canal and external ear normal. No tenderness. No middle ear effusion. There is no impacted cerumen. Tympanic membrane is not perforated, erythematous, retracted or bulging.     Left Ear:  Tympanic membrane, ear canal and external ear normal. No tenderness.  No middle ear effusion. There is no impacted cerumen. Tympanic membrane is not perforated, erythematous, retracted or bulging.     Nose: Nose normal. No congestion.     Mouth/Throat:     Mouth: Mucous membranes are moist.     Pharynx: Uvula midline. No oropharyngeal exudate or posterior oropharyngeal erythema.     Tonsils: No tonsillar exudate.     Comments: Posterior oropharyngeal cobblestoning Eyes:     Extraocular Movements: Extraocular movements intact.     Pupils: Pupils are equal, round, and reactive to light.  Cardiovascular:     Rate and Rhythm: Normal rate and regular rhythm.     Heart sounds: Normal heart sounds.  Pulmonary:     Effort: Pulmonary effort is normal.     Breath sounds: Normal breath sounds. No decreased breath sounds, wheezing, rhonchi or rales.  Abdominal:     Palpations: Abdomen is soft.     Tenderness: There is no abdominal tenderness. There is no guarding or rebound.  Lymphadenopathy:     Cervical: No cervical adenopathy.     Right cervical: No superficial, deep or posterior cervical adenopathy.    Left cervical: No superficial, deep or posterior cervical adenopathy.  Skin:    Comments: No rash   Neurological:     General: No focal deficit present.     Mental Status: He is alert and oriented to person, place, and time.  Psychiatric:        Mood and Affect: Mood normal.        Behavior: Behavior normal.        Thought Content: Thought content normal.        Judgment: Judgment normal.      UC Treatments /  Results  Labs (all labs ordered are listed, but only abnormal results are displayed) Labs Reviewed - No data to display  EKG   Radiology No results found.  Procedures Procedures (including critical care time)  Medications Ordered in UC Medications - No data to display  Initial Impression / Assessment and Plan / UC Course  I have reviewed the triage vital signs and  the nursing notes.  Pertinent labs & imaging results that were available during my care of the patient were reviewed by me and considered in my medical decision making (see chart for details).     Patient is a pleasant 59 y.o. male presenting with viral URI with cough, and right otalgia. The patient is afebrile and nontachycardic.  Antipyretic has not been administered today.  On exam, he does not have an ear infection.  ESRD on dialysis Monday/Wednesday/Friday. Creatinine clearance estimated to be 13 mL/min based on 10/2024 CMP, using the Cockcroft-Gault equation.  Prednisone  and azelastine  nasal spray sent.  Return precautions as below. He will let his dialysis nurse know that he is taking these medications at his appt tomorrow.  Final Clinical Impressions(s) / UC Diagnoses   Final diagnoses:  Viral URI with cough  Acute otalgia, right  ESRD on dialysis [facility])     Discharge Instructions      -Use the azelastine  nasal spray for nasal congestion, 2 sprays twice daily while symptoms persist. -Prednisone , 2 pills taken at the same time for 5 days in a row.  Try taking this earlier in the day as it can give you energy.  -Follow-up if symptoms worsen or persist -Let your dialysis nurse know that you are taking these medications at your appointment tomorrow     ED Prescriptions     Medication Sig Dispense Auth. Provider   predniSONE  (DELTASONE ) 10 MG tablet Take 2 tablets (20 mg total) by mouth daily for 5 days. 10 tablet Guinn Delarosa E, PA-C   azelastine  (ASTELIN ) 0.1 % nasal spray Place 2 sprays into both nostrils 2 (two) times daily. Use in each nostril as directed 30 mL Arlyss Leita BRAVO, PA-C      PDMP not reviewed this encounter.     [1]  Social History Tobacco Use   Smoking status: Former    Current packs/day: 0.00    Types: Cigarettes    Quit date: 09/03/2012    Years since quitting: 12.1   Smokeless tobacco: Former    Quit date: 09/03/2012  Vaping Use   Vaping  status: Never Used  Substance Use Topics   Alcohol use: Not Currently   Drug use: Not Currently     Arlyss Leita BRAVO DEVONNA 11/04/24 [id number]  "

## 2024-11-04 NOTE — ED Triage Notes (Signed)
 PT reports Rt ear pain for a couple days. Pain radiates down Rt side of neck. Pt also has a cough.

## 2024-11-04 NOTE — Discharge Instructions (Addendum)
-  Use the azelastine  nasal spray for nasal congestion, 2 sprays twice daily while symptoms persist. -Prednisone , 2 pills taken at the same time for 5 days in a row.  Try taking this earlier in the day as it can give you energy.  -Follow-up if symptoms worsen or persist -Let your dialysis nurse know that you are taking these medications at your appointment tomorrow
